# Patient Record
Sex: Female | Born: 1940
Health system: Southern US, Community
[De-identification: ages and names within clinical notes are randomized; demographics above are authoritative.]

## PROBLEM LIST (undated history)

## (undated) DIAGNOSIS — T8859XA Other complications of anesthesia, initial encounter: Secondary | ICD-10-CM

## (undated) DIAGNOSIS — I499 Cardiac arrhythmia, unspecified: Secondary | ICD-10-CM

## (undated) DIAGNOSIS — R112 Nausea with vomiting, unspecified: Secondary | ICD-10-CM

## (undated) DIAGNOSIS — I10 Essential (primary) hypertension: Secondary | ICD-10-CM

## (undated) DIAGNOSIS — E039 Hypothyroidism, unspecified: Secondary | ICD-10-CM

## (undated) DIAGNOSIS — K432 Incisional hernia without obstruction or gangrene: Secondary | ICD-10-CM

## (undated) DIAGNOSIS — T4145XA Adverse effect of unspecified anesthetic, initial encounter: Secondary | ICD-10-CM

## (undated) DIAGNOSIS — M899 Disorder of bone, unspecified: Secondary | ICD-10-CM

## (undated) DIAGNOSIS — E785 Hyperlipidemia, unspecified: Secondary | ICD-10-CM

## (undated) DIAGNOSIS — M545 Low back pain: Secondary | ICD-10-CM

## (undated) DIAGNOSIS — C3490 Malignant neoplasm of unspecified part of unspecified bronchus or lung: Secondary | ICD-10-CM

## (undated) DIAGNOSIS — M949 Disorder of cartilage, unspecified: Secondary | ICD-10-CM

## (undated) DIAGNOSIS — Z905 Acquired absence of kidney: Secondary | ICD-10-CM

## (undated) DIAGNOSIS — Z86010 Personal history of colonic polyps: Secondary | ICD-10-CM

## (undated) DIAGNOSIS — J309 Allergic rhinitis, unspecified: Secondary | ICD-10-CM

## (undated) DIAGNOSIS — Z9889 Other specified postprocedural states: Secondary | ICD-10-CM

## (undated) DIAGNOSIS — I480 Paroxysmal atrial fibrillation: Secondary | ICD-10-CM

## (undated) DIAGNOSIS — N183 Chronic kidney disease, stage 3 (moderate): Secondary | ICD-10-CM

## (undated) DIAGNOSIS — K219 Gastro-esophageal reflux disease without esophagitis: Secondary | ICD-10-CM

## (undated) DIAGNOSIS — K5730 Diverticulosis of large intestine without perforation or abscess without bleeding: Secondary | ICD-10-CM

## (undated) DIAGNOSIS — I498 Other specified cardiac arrhythmias: Secondary | ICD-10-CM

## (undated) DIAGNOSIS — K648 Other hemorrhoids: Secondary | ICD-10-CM

## (undated) HISTORY — DX: Disorder of cartilage, unspecified: M94.9

## (undated) HISTORY — PX: TONSILLECTOMY: SUR1361

## (undated) HISTORY — DX: Gastro-esophageal reflux disease without esophagitis: K21.9

## (undated) HISTORY — DX: Diverticulosis of large intestine without perforation or abscess without bleeding: K57.30

## (undated) HISTORY — DX: Low back pain: M54.5

## (undated) HISTORY — DX: Other specified cardiac arrhythmias: I49.8

## (undated) HISTORY — DX: Malignant neoplasm of unspecified part of unspecified bronchus or lung: C34.90

## (undated) HISTORY — DX: Allergic rhinitis, unspecified: J30.9

## (undated) HISTORY — PX: NEPHRECTOMY: SHX65

## (undated) HISTORY — DX: Personal history of colonic polyps: Z86.010

## (undated) HISTORY — DX: Hypothyroidism, unspecified: E03.9

## (undated) HISTORY — DX: Incisional hernia without obstruction or gangrene: K43.2

## (undated) HISTORY — DX: Other hemorrhoids: K64.8

## (undated) HISTORY — PX: OTHER SURGICAL HISTORY: SHX169

## (undated) HISTORY — DX: Acquired absence of kidney: Z90.5

## (undated) HISTORY — DX: Hyperlipidemia, unspecified: E78.5

## (undated) HISTORY — DX: Paroxysmal atrial fibrillation: I48.0

## (undated) HISTORY — DX: Essential (primary) hypertension: I10

## (undated) HISTORY — DX: Disorder of bone, unspecified: M89.9

## (undated) HISTORY — DX: Chronic kidney disease, stage 3 (moderate): N18.3

---
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
## 1997-11-09 HISTORY — PX: LAMINECTOMY: SHX219

## 1998-03-21 ENCOUNTER — Inpatient Hospital Stay (HOSPITAL_COMMUNITY): Admission: RE | Admit: 1998-03-21 | Discharge: 1998-03-22 | Payer: Self-pay | Admitting: Neurosurgery

## 1998-03-28 ENCOUNTER — Inpatient Hospital Stay (HOSPITAL_COMMUNITY): Admission: EM | Admit: 1998-03-28 | Discharge: 1998-03-31 | Payer: Self-pay | Admitting: Emergency Medicine

## 1998-04-03 ENCOUNTER — Inpatient Hospital Stay (HOSPITAL_COMMUNITY): Admission: AD | Admit: 1998-04-03 | Discharge: 1998-04-08 | Payer: Self-pay | Admitting: Neurosurgery

## 2001-12-21 ENCOUNTER — Other Ambulatory Visit: Admission: RE | Admit: 2001-12-21 | Discharge: 2001-12-21 | Payer: Self-pay | Admitting: Obstetrics & Gynecology

## 2003-01-02 ENCOUNTER — Other Ambulatory Visit: Admission: RE | Admit: 2003-01-02 | Discharge: 2003-01-02 | Payer: Self-pay | Admitting: Obstetrics & Gynecology

## 2004-01-21 ENCOUNTER — Other Ambulatory Visit: Admission: RE | Admit: 2004-01-21 | Discharge: 2004-01-21 | Payer: Self-pay | Admitting: Obstetrics & Gynecology

## 2004-07-15 ENCOUNTER — Other Ambulatory Visit: Admission: RE | Admit: 2004-07-15 | Discharge: 2004-07-15 | Payer: Self-pay | Admitting: Obstetrics & Gynecology

## 2004-09-26 ENCOUNTER — Ambulatory Visit: Payer: Self-pay | Admitting: Internal Medicine

## 2005-02-11 ENCOUNTER — Other Ambulatory Visit: Admission: RE | Admit: 2005-02-11 | Discharge: 2005-02-11 | Payer: Self-pay | Admitting: Obstetrics & Gynecology

## 2005-08-17 ENCOUNTER — Ambulatory Visit: Payer: Self-pay | Admitting: Internal Medicine

## 2005-08-17 ENCOUNTER — Other Ambulatory Visit: Admission: RE | Admit: 2005-08-17 | Discharge: 2005-08-17 | Payer: Self-pay | Admitting: Obstetrics & Gynecology

## 2005-09-23 ENCOUNTER — Ambulatory Visit: Payer: Self-pay | Admitting: Internal Medicine

## 2006-02-23 ENCOUNTER — Other Ambulatory Visit: Admission: RE | Admit: 2006-02-23 | Discharge: 2006-02-23 | Payer: Self-pay | Admitting: Obstetrics & Gynecology

## 2006-04-15 ENCOUNTER — Ambulatory Visit (HOSPITAL_COMMUNITY): Admission: RE | Admit: 2006-04-15 | Discharge: 2006-04-15 | Payer: Self-pay | Admitting: Obstetrics & Gynecology

## 2006-04-15 ENCOUNTER — Encounter (INDEPENDENT_AMBULATORY_CARE_PROVIDER_SITE_OTHER): Payer: Self-pay | Admitting: *Deleted

## 2006-07-28 ENCOUNTER — Ambulatory Visit: Payer: Self-pay | Admitting: Internal Medicine

## 2006-09-10 ENCOUNTER — Ambulatory Visit: Payer: Self-pay | Admitting: Internal Medicine

## 2007-02-08 ENCOUNTER — Encounter: Payer: Self-pay | Admitting: Internal Medicine

## 2007-02-08 LAB — CONVERTED CEMR LAB: Pap Smear: NORMAL

## 2007-02-17 ENCOUNTER — Encounter: Admission: RE | Admit: 2007-02-17 | Discharge: 2007-02-17 | Payer: Self-pay | Admitting: Obstetrics & Gynecology

## 2007-06-21 ENCOUNTER — Ambulatory Visit: Payer: Self-pay | Admitting: Internal Medicine

## 2007-07-05 ENCOUNTER — Encounter: Payer: Self-pay | Admitting: Internal Medicine

## 2007-07-05 ENCOUNTER — Ambulatory Visit: Payer: Self-pay | Admitting: Internal Medicine

## 2007-08-04 ENCOUNTER — Encounter: Payer: Self-pay | Admitting: *Deleted

## 2007-08-04 DIAGNOSIS — M899 Disorder of bone, unspecified: Secondary | ICD-10-CM

## 2007-08-04 DIAGNOSIS — K219 Gastro-esophageal reflux disease without esophagitis: Secondary | ICD-10-CM

## 2007-08-04 DIAGNOSIS — K648 Other hemorrhoids: Secondary | ICD-10-CM

## 2007-08-04 DIAGNOSIS — M949 Disorder of cartilage, unspecified: Secondary | ICD-10-CM

## 2007-08-04 DIAGNOSIS — E669 Obesity, unspecified: Secondary | ICD-10-CM | POA: Insufficient documentation

## 2007-08-04 HISTORY — DX: Gastro-esophageal reflux disease without esophagitis: K21.9

## 2007-08-04 HISTORY — DX: Other hemorrhoids: K64.8

## 2007-08-04 HISTORY — DX: Disorder of bone, unspecified: M89.9

## 2007-08-06 DIAGNOSIS — I10 Essential (primary) hypertension: Secondary | ICD-10-CM | POA: Insufficient documentation

## 2007-08-06 DIAGNOSIS — M545 Low back pain, unspecified: Secondary | ICD-10-CM | POA: Insufficient documentation

## 2007-08-06 HISTORY — DX: Essential (primary) hypertension: I10

## 2007-08-06 HISTORY — DX: Low back pain, unspecified: M54.50

## 2007-08-25 ENCOUNTER — Encounter: Payer: Self-pay | Admitting: Internal Medicine

## 2007-08-25 ENCOUNTER — Ambulatory Visit: Payer: Self-pay | Admitting: Internal Medicine

## 2007-08-25 DIAGNOSIS — K5730 Diverticulosis of large intestine without perforation or abscess without bleeding: Secondary | ICD-10-CM

## 2007-08-25 DIAGNOSIS — Z860100 Personal history of colon polyps, unspecified: Secondary | ICD-10-CM

## 2007-08-25 HISTORY — DX: Personal history of colon polyps, unspecified: Z86.0100

## 2007-08-25 HISTORY — DX: Personal history of colonic polyps: Z86.010

## 2007-08-25 HISTORY — DX: Diverticulosis of large intestine without perforation or abscess without bleeding: K57.30

## 2007-08-25 LAB — CONVERTED CEMR LAB
Albumin: 3.9 g/dL (ref 3.5–5.2)
Basophils Absolute: 0 10*3/uL (ref 0.0–0.1)
Bilirubin Urine: NEGATIVE
Cholesterol: 233 mg/dL (ref 0–200)
Creatinine, Ser: 1.1 mg/dL (ref 0.4–1.2)
Direct LDL: 126.3 mg/dL
Eosinophils Absolute: 0.1 10*3/uL (ref 0.0–0.6)
GFR calc Af Amer: 64 mL/min
GFR calc non Af Amer: 53 mL/min
HCT: 38.9 % (ref 36.0–46.0)
HDL: 70.4 mg/dL (ref >39.0)
Hemoglobin, Urine: NEGATIVE
Hemoglobin: 13.5 g/dL (ref 12.0–15.0)
Leukocytes, UA: NEGATIVE
MCHC: 34.6 g/dL (ref 30.0–36.0)
MCV: 96.9 fL (ref 78.0–100.0)
Monocytes Absolute: 0.6 10*3/uL (ref 0.2–0.7)
Neutro Abs: 4 10*3/uL (ref 1.4–7.7)
Neutrophils Relative %: 60.3 % (ref 43.0–77.0)
Potassium: 5.2 meq/L — ABNORMAL HIGH (ref 3.5–5.1)
Sodium: 144 meq/L (ref 135–145)
TSH: 4.01 microintl units/mL (ref 0.35–5.50)
Total Bilirubin: 0.9 mg/dL (ref 0.3–1.2)
Total CHOL/HDL Ratio: 3.3
Urobilinogen, UA: 0.2 (ref 0.0–1.0)

## 2007-11-18 ENCOUNTER — Encounter: Payer: Self-pay | Admitting: Internal Medicine

## 2008-08-15 ENCOUNTER — Telehealth: Payer: Self-pay | Admitting: Internal Medicine

## 2008-08-30 ENCOUNTER — Ambulatory Visit: Payer: Self-pay | Admitting: Internal Medicine

## 2008-08-30 DIAGNOSIS — R21 Rash and other nonspecific skin eruption: Secondary | ICD-10-CM | POA: Insufficient documentation

## 2009-02-07 LAB — HM MAMMOGRAPHY: HM Mammogram: NORMAL

## 2009-02-07 LAB — CONVERTED CEMR LAB: Pap Smear: NORMAL

## 2009-02-25 ENCOUNTER — Encounter: Payer: Self-pay | Admitting: Internal Medicine

## 2009-03-14 ENCOUNTER — Telehealth (INDEPENDENT_AMBULATORY_CARE_PROVIDER_SITE_OTHER): Payer: Self-pay | Admitting: *Deleted

## 2009-04-09 ENCOUNTER — Ambulatory Visit: Payer: Self-pay | Admitting: Internal Medicine

## 2009-04-09 DIAGNOSIS — M549 Dorsalgia, unspecified: Secondary | ICD-10-CM | POA: Insufficient documentation

## 2009-04-12 ENCOUNTER — Telehealth: Payer: Self-pay | Admitting: Internal Medicine

## 2009-04-16 ENCOUNTER — Encounter: Admission: RE | Admit: 2009-04-16 | Discharge: 2009-04-16 | Payer: Self-pay | Admitting: Internal Medicine

## 2009-04-19 ENCOUNTER — Telehealth: Payer: Self-pay | Admitting: Internal Medicine

## 2009-05-01 ENCOUNTER — Encounter: Payer: Self-pay | Admitting: Internal Medicine

## 2009-05-15 ENCOUNTER — Encounter: Payer: Self-pay | Admitting: Internal Medicine

## 2009-06-12 ENCOUNTER — Encounter: Payer: Self-pay | Admitting: Internal Medicine

## 2009-08-06 ENCOUNTER — Ambulatory Visit: Payer: Self-pay | Admitting: Internal Medicine

## 2009-08-06 LAB — CONVERTED CEMR LAB
ALT: 16 units/L (ref 0–35)
Albumin: 3.6 g/dL (ref 3.5–5.2)
Alkaline Phosphatase: 55 units/L (ref 39–117)
Basophils Relative: 0.6 % (ref 0.0–3.0)
Bilirubin, Direct: 0.1 mg/dL (ref 0.0–0.3)
CO2: 31 meq/L (ref 19–32)
Chloride: 108 meq/L (ref 96–112)
Cholesterol: 203 mg/dL — ABNORMAL HIGH (ref 0–200)
Direct LDL: 114.9 mg/dL
Eosinophils Relative: 0.8 % (ref 0.0–5.0)
Glucose, Bld: 92 mg/dL (ref 70–99)
Monocytes Absolute: 0.6 10*3/uL (ref 0.1–1.0)
Neutro Abs: 5.7 10*3/uL (ref 1.4–7.7)
Neutrophils Relative %: 70.6 % (ref 43.0–77.0)
RDW: 12.6 % (ref 11.5–14.6)
Sodium: 144 meq/L (ref 135–145)
Specific Gravity, Urine: 1.02 (ref 1.000–1.030)
Total CHOL/HDL Ratio: 3
Total Protein, Urine: NEGATIVE mg/dL
Total Protein: 6.7 g/dL (ref 6.0–8.3)
Urine Glucose: NEGATIVE mg/dL

## 2009-08-13 ENCOUNTER — Ambulatory Visit: Payer: Self-pay | Admitting: Internal Medicine

## 2009-08-13 DIAGNOSIS — R634 Abnormal weight loss: Secondary | ICD-10-CM | POA: Insufficient documentation

## 2009-08-13 DIAGNOSIS — K432 Incisional hernia without obstruction or gangrene: Secondary | ICD-10-CM | POA: Insufficient documentation

## 2009-08-13 HISTORY — DX: Incisional hernia without obstruction or gangrene: K43.2

## 2009-09-04 ENCOUNTER — Encounter: Payer: Self-pay | Admitting: Internal Medicine

## 2009-10-24 ENCOUNTER — Inpatient Hospital Stay (HOSPITAL_COMMUNITY): Admission: AD | Admit: 2009-10-24 | Discharge: 2009-10-28 | Payer: Self-pay | Admitting: General Surgery

## 2009-11-21 ENCOUNTER — Encounter: Payer: Self-pay | Admitting: Internal Medicine

## 2009-11-26 ENCOUNTER — Telehealth: Payer: Self-pay | Admitting: Internal Medicine

## 2009-12-16 ENCOUNTER — Encounter: Payer: Self-pay | Admitting: Internal Medicine

## 2010-02-26 ENCOUNTER — Encounter: Payer: Self-pay | Admitting: Internal Medicine

## 2010-05-27 ENCOUNTER — Encounter: Payer: Self-pay | Admitting: Internal Medicine

## 2010-06-05 ENCOUNTER — Encounter: Payer: Self-pay | Admitting: Internal Medicine

## 2010-06-05 ENCOUNTER — Ambulatory Visit: Payer: Self-pay | Admitting: Internal Medicine

## 2010-06-05 DIAGNOSIS — M79609 Pain in unspecified limb: Secondary | ICD-10-CM | POA: Insufficient documentation

## 2010-09-19 ENCOUNTER — Ambulatory Visit: Payer: Self-pay | Admitting: Internal Medicine

## 2010-09-19 LAB — CONVERTED CEMR LAB
AST: 18 units/L (ref 0–37)
BUN: 24 mg/dL — ABNORMAL HIGH (ref 6–23)
Basophils Absolute: 0 10*3/uL (ref 0.0–0.1)
Calcium: 9.9 mg/dL (ref 8.4–10.5)
Cholesterol: 216 mg/dL — ABNORMAL HIGH (ref 0–200)
Creatinine, Ser: 1.4 mg/dL — ABNORMAL HIGH (ref 0.4–1.2)
Direct LDL: 109.4 mg/dL
GFR calc non Af Amer: 39.8 mL/min (ref >60)
Glucose, Bld: 90 mg/dL (ref 70–99)
HCT: 38.2 % (ref 36.0–46.0)
HDL: 77.1 mg/dL (ref >39.00)
Ketones, ur: NEGATIVE mg/dL
Leukocytes, UA: NEGATIVE
Lymphocytes Relative: 26.6 % (ref 12.0–46.0)
Lymphs Abs: 1.5 10*3/uL (ref 0.7–4.0)
Monocytes Relative: 7.6 % (ref 3.0–12.0)
Nitrite: NEGATIVE
Platelets: 206 10*3/uL (ref 150.0–400.0)
RDW: 13.6 % (ref 11.5–14.6)
Specific Gravity, Urine: 1.02 (ref 1.000–1.030)
TSH: 4.17 microintl units/mL (ref 0.35–5.50)
Total Bilirubin: 0.7 mg/dL (ref 0.3–1.2)
Triglycerides: 128 mg/dL (ref 0.0–149.0)
pH: 5.5 (ref 5.0–8.0)

## 2010-09-26 ENCOUNTER — Ambulatory Visit: Payer: Self-pay | Admitting: Internal Medicine

## 2010-09-26 DIAGNOSIS — I498 Other specified cardiac arrhythmias: Secondary | ICD-10-CM

## 2010-09-26 HISTORY — DX: Other specified cardiac arrhythmias: I49.8

## 2010-09-27 DIAGNOSIS — E785 Hyperlipidemia, unspecified: Secondary | ICD-10-CM | POA: Insufficient documentation

## 2010-09-27 HISTORY — DX: Hyperlipidemia, unspecified: E78.5

## 2010-10-24 ENCOUNTER — Ambulatory Visit: Payer: Self-pay | Admitting: Internal Medicine

## 2010-10-24 LAB — CONVERTED CEMR LAB
GFR calc non Af Amer: 47.15 mL/min — ABNORMAL LOW (ref >60.00)
Potassium: 5.2 meq/L — ABNORMAL HIGH (ref 3.5–5.1)
Sodium: 140 meq/L (ref 135–145)

## 2010-11-30 ENCOUNTER — Encounter: Payer: Self-pay | Admitting: Obstetrics & Gynecology

## 2010-12-07 LAB — CONVERTED CEMR LAB
Albumin: 3.7 g/dL (ref 3.5–5.2)
BUN: 20 mg/dL (ref 6–23)
Bilirubin Urine: NEGATIVE
Calcium: 9.4 mg/dL (ref 8.4–10.5)
Creatinine, Ser: 1.3 mg/dL — ABNORMAL HIGH (ref 0.4–1.2)
Eosinophils Relative: 1.9 % (ref 0.0–5.0)
GFR calc Af Amer: 52 mL/min
Glucose, Bld: 93 mg/dL (ref 70–99)
HCT: 40.2 % (ref 36.0–46.0)
Hemoglobin: 13.8 g/dL (ref 12.0–15.0)
Monocytes Absolute: 0.6 10*3/uL (ref 0.1–1.0)
Monocytes Relative: 7.6 % (ref 3.0–12.0)
Neutro Abs: 5.5 10*3/uL (ref 1.4–7.7)
Nitrite: NEGATIVE
Pap Smear: NORMAL
RDW: 12.5 % (ref 11.5–14.6)
TSH: 2.85 microintl units/mL (ref 0.35–5.50)
Total Protein, Urine: NEGATIVE mg/dL
Total Protein: 6.6 g/dL (ref 6.0–8.3)
Triglycerides: 143 mg/dL (ref 0–149)
Vit D, 1,25-Dihydroxy: 40 (ref 30–89)
WBC: 8.4 10*3/uL (ref 4.5–10.5)
pH: 5.5 (ref 5.0–8.0)

## 2010-12-11 NOTE — Letter (Signed)
Summary: [facility]   Imported By: Sherian Rein 12/12/2009 11:23:18  _____________________________________________________________________  External Attachment:    Type:   Image     Comment:   External Document

## 2010-12-11 NOTE — Letter (Signed)
Summary: [facility]   Imported By: Lester Noonday 12/30/2009 10:18:36  _____________________________________________________________________  External Attachment:    Type:   Image     Comment:   External Document

## 2010-12-11 NOTE — Assessment & Plan Note (Signed)
Summary: 3 mos f/u #/cd   Vital Signs:  Patient profile:   70 year old female Height:      67 inches Weight:      146.50 pounds BMI:     23.03 O2 Sat:      99 % on Room air Temp:     96.8 degrees F oral Pulse rate:   44 / minute BP sitting:   120 / 72  (left arm) Cuff size:   regular  Vitals Entered By: Zella Ball Ewing CMA (AAMA) (September 26, 2010 1:29 PM)  O2 Flow:  Room air  CC: 3 month ROV/RE   CC:  3 month ROV/RE.  History of Present Illness: here for wellness, and f/u;  VS review shows low HR apparently asympt  and Pt denies CP, worsening sob, doe, wheezing, orthopnea, pnd, worsening LE edema, palps, dizziness or syncope  Also noted is solitary kidney status on ARB.  Pt denies new neuro symptoms such as headache, facial or extremity weakness  Pt denies polydipsia, polyuria  Overall good compliance with meds, trying to follow low chol  diet, wt stable, little excercise however .  Overall good compliance with meds, and good tolerability.  Denies worsening depressive symptoms, suicidal ideation, or panic.   No fever, wt loss, night sweats, loss of appetite or other constitutional symptoms  Denies hyper or hypo thyroid symtpoms such as wt , voice or skin change.  Due for flu shot today.  Needs med refills.    Preventive Screening-Counseling & Management      Drug Use:  no.    Problems Prior to Update: 1)  Arm Pain, Left  (ICD-729.5) 2)  Abdominal Incisional Hernia  (ICD-553.21) 3)  Weight Loss  (ICD-783.21) 4)  Back Pain  (ICD-724.5) 5)  Rash-nonvesicular  (ICD-782.1) 6)  Preventive Health Care  (ICD-V70.0) 7)  Preventive Health Care  (ICD-V70.0) 8)  Family History Diabetes 1st Degree Relative  (ICD-V18.0) 9)  Family History of Cad Female 1st Degree Relative <60  (ICD-V16.49) 10)  Diverticulosis, Colon  (ICD-562.10) 11)  Colonic Polyps, Hx of  (ICD-V12.72) 12)  Low Back Pain  (ICD-724.2) 13)  Hypertension  (ICD-401.9) 14)  Osteopenia  (ICD-733.90) 15)  Obesity   (ICD-278.00) 16)  Internal Hemorrhoids  (ICD-455.0) 17)  Gastroesophageal Reflux Disease  (ICD-530.81)  Medications Prior to Update: 1)  Atenolol 25 Mg Tabs (Atenolol) .[name].. 1 Tablet By Mouth Once A Day 2)  Cozaar 50 Mg Tabs (Losartan Potassium) .... Take 1 Tablet By Mouth Once A Day 3)  Levothyroxine Sodium 25 Mcg Tabs (Levothyroxine Sodium) .... Take 1 Tablet By Mouth Once A Day 4)  Lansoprazole 30 Mg Cpdr (Lansoprazole) .[name].. 1 By Mouth Once Daily 5)  Prempro 0.3-1.5 Mg  Tabs (Conj Estrog-Medroxyprogest Ace) .[name].. 1 By Mouth Once Daily 6)  Alprazolam 0.5 Mg  Tb24 (Alprazolam) .[name].. 1 By Mouth At Bedtime As Needed 7)  Ecotrin Low Strength 81 Mg  Tbec (Aspirin) .[name].. 1 By Mouth Qd 8)  Elidel 1 % Crea (Pimecrolimus) .... Use Asd Two Times A Day As Needed  Current Medications (verified): 1)  Levothyroxine Sodium 25 Mcg Tabs (Levothyroxine Sodium) .... Take 1 Tablet By Mouth Once A Day 2)  Lansoprazole 30 Mg Cpdr (Lansoprazole) .[name].. 1 By Mouth Once Daily 3)  Prempro 0.3-1.5 Mg  Tabs (Conj Estrog-Medroxyprogest Ace) .[name].. 1 By Mouth Once Daily 4)  Alprazolam 0.5 Mg  Tabs (Alprazolam) .[name].. 1po At Bedtime As Needed 5)  Ecotrin Low Strength 81 Mg  Tbec (  Aspirin) .[name].. 1 By Mouth Qd 6)  Elidel 1 % Crea (Pimecrolimus) .... Use Asd Two Times A Day As Needed 7)  Amlodipine Besylate 5 Mg Tabs (Amlodipine Besylate) .[name].. 1po Once Daily  Allergies (verified): 1)  ! 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         all otherwise negative per pt -    Physical Exam  General:  alert and well-developed.   Head:  normocephalic and atraumatic.   Eyes:  vision grossly intact, pupils equal, and pupils round.   Ears:  R ear normal and L ear normal.   Nose:  no external deformity and no nasal discharge.   Mouth:  no gingival abnormalities and pharynx pink and moist.   Neck:  supple and no masses.   Lungs:  normal respiratory effort and normal breath sounds.   Heart:  normal rate and regular rhythm.   Abdomen:  soft, non-tender, and normal bowel sounds.   Msk:  no joint tenderness and no joint swelling.   Extremities:  no edema, no erythema  Neurologic:  cranial nerves II-XII intact, strength normal in all extremities, gait normal, and DTRs symmetrical and normal.   Skin:  color normal and no rashes.   Psych:  not depressed appearing and slightly anxious.     Impression & Recommendations:  Problem # 1:  Preventive Health Care  (ICD-V70.0) Overall doing well, age appropriate education and counseling updated, referral for preventive services and immunizations addressed, dietary counseling and smoking status adressed , most recent labs reviewed I have personally reviewed and have noted 1.The patient's medical and social history 2.Their use of alcohol, tobacco or illicit drugs 3.Their current medications and supplements 4. Functional ability including ADL's, fall risk, home safety risk, hearing & visual impairment  5.Diet and physical activities 6.Evidence for depression or mood disorders The patients weight, height, BMI  have been recorded in the chart I have made referrals, counseling and provided education to the patient based review of the above   Problem # 2:  HYPERTENSION (ICD-401.9)  The following medications were removed from the medication list:    Atenolol 25 Mg Tabs (Atenolol) .[name]... 1 tablet by mouth once a day    Cozaar 50 Mg Tabs (Losartan potassium) .[name]... Take 1 tablet by mouth once a day Her updated medication  list for this problem includes:    Amlodipine Besylate 5 Mg Tabs (Amlodipine besylate) .[name]... 1po once daily  BP today: 120/72 Prior BP: 102/80 (06/05/2010)  Labs Reviewed: K+: 5.2 (09/19/2010) Creat: : 1.4 (09/19/2010)   Chol: 216 (09/19/2010)   HDL: 77.10 (09/19/2010)   LDL: DEL (08/30/2008)   TG: 128.0 (09/19/2010) stable overall by hx and exam, ok to continue meds/tx as is except change cozaar to amlodipine with mild cr elev at 1.4  Problem # 3:  BRADYCARDIA (ICD-427.89)  The following medications were removed from the medication list:    Atenolol 25 Mg Tabs (Atenolol) .[name]... 1 tablet by mouth once a day Her updated medication list for this problem includes:    Ecotrin Low Strength 81 Mg Tbec (Aspirin) .[name]... 1 by mouth qd to stop the beta blocker  Problem # 4:  HYPERLIPIDEMIA (ICD-272.4)  Labs Reviewed: SGOT: 18 (09/19/2010)   SGPT: 18 (09/19/2010)   HDL:77.10 (09/19/2010), 64.00  (08/06/2009)  LDL:DEL (08/30/2008), DEL (08/25/2007)  Chol:216 (09/19/2010), 203 (08/06/2009)  Trig:128.0 (09/19/2010), 119.0 (08/06/2009) Pt to continue diet efforts,  goal LDL less than 100  Complete Medication List: 1)  Levothyroxine Sodium 25 Mcg Tabs (Levothyroxine sodium) .... Take 1 tablet by mouth once a day 2)  Lansoprazole 30 Mg Cpdr (Lansoprazole) .[name].. 1 by mouth once daily 3)  Prempro 0.3-1.5 Mg Tabs (Conj estrog-medroxyprogest ace) .[name].. 1 by mouth once daily 4)  Alprazolam 0.5 Mg Tabs (Alprazolam) .[name].. 1po at bedtime as needed 5)  Ecotrin Low Strength 81 Mg Tbec (Aspirin) .[name].. 1 by mouth qd 6)  Elidel 1 % Crea (Pimecrolimus) .... Use asd two times a day as needed 7)  Amlodipine Besylate 5 Mg Tabs (Amlodipine besylate) .[name].. 1po once daily  Other Orders: Flu Vaccine 67yrs + (65784) Admin 1st Vaccine (69629)  Patient Instructions: 1)  you had the flu shot today 2)  stop the atenolol 3)  stop the cozaar 4)  start the amlodpine 5 mg - 1 per day 5)  Please return for LAB only in 4 wks:  BMET :  285.9 6)  Continue all other previous medications as before this visit  7)  Check your Blood Pressure regularly. If it is above 140/90: you should make an appointment. 8)  Please schedule a follow-up appointment in 6 months, or sooner if needed Prescriptions: LANSOPRAZOLE 30 MG CPDR (LANSOPRAZOLE) 1 by mouth once daily  #90 x 3   Entered and Authorized by:   Corwin Levins MD   Signed by:   Corwin Levins MD on 09/26/2010   Method used:   Print then Give to Patient   RxID:   [id number] LEVOTHYROXINE SODIUM 25 MCG TABS (LEVOTHYROXINE SODIUM) Take 1 tablet by mouth once a day  #90 x 3   Entered and Authorized by:   Corwin Levins MD   Signed by:   Corwin Levins MD on 09/26/2010   Method used:   Print then Give to Patient   RxID:   [id number] AMLODIPINE BESYLATE 5 MG TABS (AMLODIPINE BESYLATE) 1po once daily  #90 x 3   Entered and Authorized by:   Corwin Levins MD   Signed by:    Corwin Levins MD on 09/26/2010   Method used:   Print then Give to Patient   RxID:   [id number] ALPRAZOLAM 0.5 MG  TABS (ALPRAZOLAM) 1po at bedtime as needed  #30 x 5   Entered and Authorized by:   Corwin Levins MD  Signed by:   Corwin Levins MD on 09/26/2010   Method used:   Print then Give to Patient   RxID:   [id number]    Orders Added: 1)  Flu Vaccine 70yrs + [56213] 2)  Admin 1st Vaccine [90471] 3)  Est. Patient 65& > [08657]   Immunizations Administered:  Influenza Vaccine # 1:    Vaccine Type: Fluvax 3+    Site: left deltoid    Mfr: Sanofi Pasteur    Dose: 0.5 ml    Route: IM    Given by: Zella Ball Ewing CMA (AAMA)    Exp. Date: 05/09/2011    Lot #: [id number]    VIS given: 06/02/07 version given September 26, 2010.  Flu Vaccine Consent Questions:    Do you have a history of severe allergic reactions to this vaccine? no    Any prior history of allergic reactions to egg and/or gelatin? no    Do you have a sensitivity to the preservative Thimersol? no    Do you have a past history of Guillan-Barre Syndrome? no    Do you currently have an acute febrile illness? no    Have you ever had a severe reaction to latex? no    Vaccine information given and explained to patient? no    Are you currently pregnant? no   Immunizations Administered:  Influenza Vaccine # 1:    Vaccine Type: Fluvax 3+    Site: left deltoid    Mfr: Sanofi Pasteur    Dose: 0.5 ml    Route: IM    Given by: Zella Ball Ewing CMA (AAMA)    Exp. Date: 05/09/2011    Lot #: [id number]    VIS given: 06/02/07 version given September 26, 2010.

## 2010-12-11 NOTE — Progress Notes (Signed)
----   Converted from flag ---- ---- 11/26/2009 10:09 AM, Corwin Levins MD wrote: noted; appears pt has been filling early somewhat on a monthly basis  ---- 11/26/2009 9:43 AM, Zella Ball Ewing wrote: [facility] in [place] called questioning pts xanax rx. The Pharmacist informed she has been getting it filled with them the .5mg  three times a day #90, filled on 06/01/2009, 07/03/2009, 08/02/2009, 08/29/2009, 09/27/2009 and 10/23/2009.  Thought you should be aware. ------------------------------

## 2010-12-11 NOTE — Assessment & Plan Note (Signed)
Summary: left arm pain/#/cd--okd dahlia   Vital Signs:  Patient profile:   70 year old female Height:      67 inches Weight:      146.50 pounds BMI:     23.03 O2 Sat:      98 % on Room air Temp:     98.3 degrees F oral Pulse rate:   49 / minute BP sitting:   102 / 80  (left arm) Cuff size:   regular  Vitals Entered By: Zella Ball Ewing CMA (AAMA) (June 05, 2010 1:54 PM)  O2 Flow:  Room air CC: Left arm pain/RE   CC:  Left arm pain/RE.  History of Present Illness: here with1 -2 days onset mild to mod sharp pain to left upper pain , last few several seconds and resolves;  no shoulder pain or elbow pain or other symptom;  Pt denies CP, sob, doe, wheezing, orthopnea, pnd, worsening LE edema, palps, dizziness or syncope No fever, wt loss, night sweats, loss of appetite or other constitutional symptoms  Pt denies new neuro symptoms such as headache, facial or extremity weakness  Per pt almost "feels like spasm.'   No reflux worsening , dysphagia, n/v, wt loss, abd pain.    Problems Prior to Update: 1)  Arm Pain, Left  (ICD-729.5) 2)  Abdominal Incisional Hernia  (ICD-553.21) 3)  Weight Loss  (ICD-783.21) 4)  Back Pain  (ICD-724.5) 5)  Rash-nonvesicular  (ICD-782.1) 6)  Preventive Health Care  (ICD-V70.0) 7)  Preventive Health Care  (ICD-V70.0) 8)  Family History Diabetes 1st Degree Relative  (ICD-V18.0) 9)  Family History of Cad Female 1st Degree Relative <60  (ICD-V16.49) 10)  Diverticulosis, Colon  (ICD-562.10) 11)  Colonic Polyps, Hx of  (ICD-V12.72) 12)  Low Back Pain  (ICD-724.2) 13)  Hypertension  (ICD-401.9) 14)  Osteopenia  (ICD-733.90) 15)  Obesity  (ICD-278.00) 16)  Internal Hemorrhoids  (ICD-455.0) 17)  Gastroesophageal Reflux Disease  (ICD-530.81)  Medications Prior to Update: 1)  Atenolol 25 Mg Tabs (Atenolol) .[name].. 1 Tablet By Mouth Once A Day 2)  Cozaar 50 Mg Tabs (Losartan Potassium) .... Take 1 Tablet By Mouth Once A Day 3)  Levothyroxine Sodium 25 Mcg Tabs  (Levothyroxine Sodium) .... Take 1 Tablet By Mouth Once A Day 4)  Lansoprazole 30 Mg Cpdr (Lansoprazole) .[name].. 1 By Mouth Once Daily 5)  Prempro 0.3-1.5 Mg  Tabs (Conj Estrog-Medroxyprogest Ace) .[name].. 1 By Mouth Once Daily 6)  Alprazolam 0.5 Mg  Tb24 (Alprazolam) .[name].. 1 By Mouth At Bedtime As Needed 7)  Ecotrin Low Strength 81 Mg  Tbec (Aspirin) .[name].. 1 By Mouth Qd 8)  Elidel 1 % Crea (Pimecrolimus) .... Use Asd Two Times A Day As Needed  Current Medications (verified): 1)  Atenolol 25 Mg Tabs (Atenolol) .[name].. 1 Tablet By Mouth Once A Day 2)  Cozaar 50 Mg Tabs (Losartan Potassium) .... Take 1 Tablet By Mouth Once A Day 3)  Levothyroxine Sodium 25 Mcg Tabs (Levothyroxine Sodium) .... Take 1 Tablet By Mouth Once A Day 4)  Lansoprazole 30 Mg Cpdr (Lansoprazole) .[name].. 1 By Mouth Once Daily 5)  Prempro 0.3-1.5 Mg  Tabs (Conj Estrog-Medroxyprogest Ace) .[name].. 1 By Mouth Once Daily 6)  Alprazolam 0.5 Mg  Tb24 (Alprazolam) .[name].. 1 By Mouth At Bedtime As Needed 7)  Ecotrin Low Strength 81 Mg  Tbec (Aspirin) .[name].. 1 By Mouth Qd 8)  Elidel 1 % Crea (Pimecrolimus) .... Use Asd Two Times A Day As Needed  Allergies (verified): 1)  !  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   Head:  normocephalic and  atraumatic.   Eyes:  vision grossly intact, pupils equal, and pupils round.   Ears:  R ear normal and L ear normal.   Nose:  no external deformity and no nasal discharge.   Mouth:  no gingival abnormalities and pharynx pink and moist.   Neck:  supple and no masses.   Lungs:  normal respiratory effort and normal breath sounds.   Heart:  normal rate and regular rhythm.   Msk:  left tricep mild tender, spine nontender, neck FROM and NT, shoudler FROM and NT, elbow FROM and NT.  Some mild tender at the deltoid/humerus insertion site as well Extremities:  no edema, no erythema  Neurologic:  strength normal in all extremities, sensation intact to light touch, and DTRs symmetrical and normal.     Impression & Recommendations:  Problem # 1:  ARM PAIN, LEFT (ICD-729.5) c/w MSK soft tissue strain or deltod tendonitis most likely, exam o/w bening, pt educated and reassured. , tx with tylenol as needed  Orders: EKG w/ Interpretation (93000)  Problem # 2:  HYPERTENSION (ICD-401.9)  Her updated medication list for this problem includes:    Atenolol 25 Mg Tabs (Atenolol) .[name]... 1 tablet by mouth once a day    Cozaar 50 Mg Tabs (Losartan potassium) .[name]... Take 1 tablet by mouth once a day  BP today: 102/80 Prior BP: 118/78 (08/13/2009)  Labs Reviewed: K+: 4.3 (08/06/2009) Creat: : 1.3 (08/06/2009)   Chol: 203 (08/06/2009)   HDL: 64.00 (08/06/2009)   LDL: DEL (08/30/2008)   TG: 119.0 (08/06/2009) stable overall by hx and exam, ok to continue meds/tx as is   Problem # 3:  GASTROESOPHAGEAL REFLUX DISEASE (ICD-530.81)  Her updated medication list for this problem includes:    Lansoprazole 30 Mg Cpdr (Lansoprazole) .[name]... 1 by mouth once daily stable overall by hx and exam, ok to continue meds/tx as is   Complete Medication List: 1)  Atenolol 25 Mg Tabs (Atenolol) .[name].. 1 tablet by mouth once a day 2)  Cozaar 50 Mg Tabs (Losartan potassium) .... Take 1 tablet by mouth once a day 3)  Levothyroxine  Sodium 25 Mcg Tabs (Levothyroxine sodium) .... Take 1 tablet by mouth once a day 4)  Lansoprazole 30 Mg Cpdr (Lansoprazole) .[name].. 1 by mouth once daily 5)  Prempro 0.3-1.5 Mg Tabs (Conj estrog-medroxyprogest ace) .[name].. 1 by mouth once daily 6)  Alprazolam 0.5 Mg Tb24 (Alprazolam) .[name].. 1 by mouth at bedtime as needed 7)  Ecotrin Low Strength 81 Mg Tbec (Aspirin) .[name].. 1 by mouth qd 8)  Elidel 1 % Crea (Pimecrolimus) .... Use asd two times a day as needed  Patient Instructions: 1)  Your EKG was OK today 2)  Continue all previous medications as before this visit  3)  You can take tylenol  as needed discomfort as needed  4)  Please schedule a follow-up appointment in 3 months with CPX labs

## 2010-12-11 NOTE — Letter (Signed)
Summary: Colonoscopy Letter  [facility]  [address] [place], [place] [address]   Phone: [phone]  Fax: [phone]      May 27, 2010 MRN: [id number]   Carolyn Wells [address] [place], [place]  [address]   Dear Ms. Ridings,   According to your medical record, it is time for you to schedule a Colonoscopy. The American Cancer Society recommends this procedure as a method to detect early colon cancer. Patients with a family history of colon cancer, or a personal history of colon polyps or inflammatory bowel disease are at increased risk.  This letter has been generated based on the recommendations made at the time of your procedure. If you feel that in your particular situation this may no longer apply, please contact our office.  Please call our office at [phone] to schedule this appointment or to update your records at your earliest convenience.  Thank you for cooperating with [place] to provide you with the very best care possible.   Sincerely,  Wilhemina Bonito. Marina Goodell, M.D.  [facility] [phone]

## 2011-02-09 LAB — BASIC METABOLIC PANEL
BUN: 7 mg/dL (ref 6–23)
CO2: 25 mEq/L (ref 19–32)
Chloride: 107 mEq/L (ref 96–112)
Chloride: 112 mEq/L (ref 96–112)
Creatinine, Ser: 1.06 mg/dL (ref 0.4–1.2)
GFR calc Af Amer: 50 mL/min — ABNORMAL LOW (ref >60)
Potassium: 4.6 mEq/L (ref 3.5–5.1)
Sodium: 139 mEq/L (ref 135–145)

## 2011-02-09 LAB — CBC
HCT: 33.8 % — ABNORMAL LOW (ref 36.0–46.0)
HCT: 35.7 % — ABNORMAL LOW (ref 36.0–46.0)
MCHC: 34.5 g/dL (ref 30.0–36.0)
MCV: 98.8 fL (ref 78.0–100.0)
MCV: 98.9 fL (ref 78.0–100.0)
Platelets: 142 10*3/uL — ABNORMAL LOW (ref 150–400)
RBC: 3.42 MIL/uL — ABNORMAL LOW (ref 3.87–5.11)
RBC: 3.61 MIL/uL — ABNORMAL LOW (ref 3.87–5.11)
WBC: 14 10*3/uL — ABNORMAL HIGH (ref 4.0–10.5)

## 2011-02-10 LAB — DIFFERENTIAL
Eosinophils Relative: 1 % (ref 0–5)
Lymphocytes Relative: 22 % (ref 12–46)
Lymphs Abs: 1.9 10*3/uL (ref 0.7–4.0)
Monocytes Absolute: 0.7 10*3/uL (ref 0.1–1.0)
Monocytes Relative: 8 % (ref 3–12)
Neutro Abs: 6.1 10*3/uL (ref 1.7–7.7)

## 2011-02-10 LAB — COMPREHENSIVE METABOLIC PANEL
AST: 22 U/L (ref 0–37)
Albumin: 3.9 g/dL (ref 3.5–5.2)
Chloride: 102 mEq/L (ref 96–112)
Creatinine, Ser: 1.42 mg/dL — ABNORMAL HIGH (ref 0.4–1.2)
GFR calc Af Amer: 44 mL/min — ABNORMAL LOW (ref >60)
Total Bilirubin: 0.5 mg/dL (ref 0.3–1.2)
Total Protein: 6.6 g/dL (ref 6.0–8.3)

## 2011-02-10 LAB — CBC
MCV: 98.2 fL (ref 78.0–100.0)
Platelets: 236 10*3/uL (ref 150–400)
RDW: 13.2 % (ref 11.5–15.5)
WBC: 8.7 10*3/uL (ref 4.0–10.5)

## 2011-03-10 ENCOUNTER — Encounter: Payer: Self-pay | Admitting: Internal Medicine

## 2011-03-12 ENCOUNTER — Other Ambulatory Visit: Payer: Self-pay | Admitting: Internal Medicine

## 2011-03-27 ENCOUNTER — Encounter: Payer: Self-pay | Admitting: Internal Medicine

## 2011-03-27 ENCOUNTER — Ambulatory Visit (INDEPENDENT_AMBULATORY_CARE_PROVIDER_SITE_OTHER): Payer: Managed Care, Other (non HMO) | Admitting: Internal Medicine

## 2011-03-27 VITALS — BP 150/82 | HR 57 | Temp 98.8°F | Ht 67.0 in | Wt 146.0 lb

## 2011-03-27 DIAGNOSIS — F419 Anxiety disorder, unspecified: Secondary | ICD-10-CM

## 2011-03-27 DIAGNOSIS — Z0000 Encounter for general adult medical examination without abnormal findings: Secondary | ICD-10-CM | POA: Insufficient documentation

## 2011-03-27 DIAGNOSIS — F418 Other specified anxiety disorders: Secondary | ICD-10-CM | POA: Insufficient documentation

## 2011-03-27 DIAGNOSIS — Z23 Encounter for immunization: Secondary | ICD-10-CM

## 2011-03-27 DIAGNOSIS — F411 Generalized anxiety disorder: Secondary | ICD-10-CM

## 2011-03-27 DIAGNOSIS — E785 Hyperlipidemia, unspecified: Secondary | ICD-10-CM

## 2011-03-27 DIAGNOSIS — I10 Essential (primary) hypertension: Secondary | ICD-10-CM

## 2011-03-27 MED ORDER — LANSOPRAZOLE 30 MG PO CPDR: 30.0000 mg | DELAYED_RELEASE_CAPSULE | Freq: Every day | ORAL | Status: DC

## 2011-03-27 MED ORDER — LEVOTHYROXINE SODIUM 25 MCG PO TABS: 25.0000 ug | ORAL_TABLET | Freq: Every day | ORAL | Status: DC

## 2011-03-27 MED ORDER — AMLODIPINE BESYLATE 5 MG PO TABS: 5.0000 mg | ORAL_TABLET | Freq: Every day | ORAL | Status: DC

## 2011-03-27 MED ORDER — PNEUMOCOCCAL VAC POLYVALENT 25 MCG/0.5ML IJ INJ
0.5000 mL | INJECTION | Freq: Once | INTRAMUSCULAR | Status: AC
  Administered 2011-03-27: 0.5 mL via INTRAMUSCULAR

## 2011-03-27 NOTE — Op Note (Signed)
NAMEGRAELYN, BIHL             ACCOUNT NO.:  [id number]   MEDICAL RECORD NO.:  [id number]          PATIENT TYPE:  AMB   LOCATION:  SDC                           FACILITY:  [facility]   PHYSICIAN:  Freddy Finner, M.D.   DATE OF BIRTH:  [birth year]   DATE OF PROCEDURE:  04/15/2006  DATE OF DISCHARGE:                                 OPERATIVE REPORT   PREOPERATIVE DIAGNOSES:  1.  Postmenopausal bleeding, on hormone replacement therapy.  2.  Endometrial thickening to 7.8 mm on pelvic ultrasound.  3.  Cervical stenosis, precluding sonohystogram.   POSTOPERATIVE DIAGNOSIS:  Filmy-appearing endometrial polyp.   OPERATIVE PROCEDURES:  1.  Hysteroscopy, complicated by uterine perforation.  2.  Laparoscopy for assessment and laparoscopic control of completion of      D&C.   SURGEON:  Freddy Finner, M.D.   ANESTHESIA:  General.   ESTIMATED INTRAOPERATIVE BLOOD LOSS:  20 cc.   SORBITOL DEFICIT:  350 cc.   OTHER INTRAOPERATIVE COMPLICATIONS:  None.   HEMOSTASIS AND PATIENT CONDITION:  Adequate and stable on completion of the  procedure.    The patient is a 70 year old who had an episode of postmenopausal bleeding  on light-dose hormone replacement therapy.  Cervical stenosis precluded  sonohystogram, but pelvic ultrasound was obtained with a finding of  thickening of the endometrium to 7.8 mm.  This warranted endometrial  sampling, and cervical stenosis precluded office procedure.  She is admitted  now for hysteroscopy and D&C.   She was admitted on the morning of surgery.  She was given 1 g of Ancef IV  preoperatively.  She was brought to the operating room, there placed under  adequate general anesthesia, placed in the dorsal lithotomy position.  Mons,  perineum, and vagina were prepped in the usual fashion.  Bivalve speculum  was introduced into the cervical os, grasping the anterior lip with a single-  tooth tenaculum.  The Mylex flexible os finder was then used and easily  identified the internal os.  Gentle placement of the sound immediately  sounded to approximately 16 cm.  The cervix was progressively dilated to 25  with Pratts.  The [facility] hysteroscope was introduced, and the cavity was  visualized, and there was this filmy lesion at the superior fundus appearing  to arise from the right.  The actual certainty of the tissue was suspicious  for fallopian tube or other intraabdominal material, and for that reason it  was elected to proceed with laparoscopy.  A Foley catheter was placed into  the bladder to preclude entry into the bladder, and the patient was given IV  methylene blue, and blue-stained urine was identified within the bladder but  nowhere else.  A Foley was also placed into the uterine cavity.  The abdomen  was then prepped, and the entire field redraped.  Two small incisions were  made in the abdomen, one at the umbilicus and one just above the symphysis.  Through the upper incision, an 11 mm nonbladed disposable trocar was  introduced while elevating the anterior abdominal wall, and direct  inspection revealed adequate placement within  the cavity, with no evidence  of injury on entry.  Pneumoperitoneum was allowed to accumulate.  A second 5  mm trocar was placed through the lower incision just above the symphysis.  Through it, a blunt probe was used for traction and exposure during the  procedure.  Scouting inspection to the upper abdomen revealed no  abnormalities except for adhesions overlying a right upper quadrant scar  done for previous kidney donation.  The appendix was not visualized, but no  other abnormalities were noted in the upper abdomen.  Pelvic structures  revealed a perforation in the top of the fundus, just to the right of  midline and adjacent to a subserosal myoma measuring approximately 1 cm.  The Foley bulb toward the tip of the Foley catheter was actually protruding  through the defect.  Photographs were made of this and  repeated after  removing the Foley.  Hemostasis from this site was adequate.  Laparoscopic  visualization was maintained, while attention was returned vaginally, and a  gentle thorough curettage and exploration with Randall stone forceps  produced what appeared to be an endometrial polyp and allowed [place] to perform  this without injury to intraabdominal structures.  A Hulka tenaculum was  then attached to the cervix.  Reinspection of abdominal contents was carried  out under irrigation using the Nezhat system.  Photographs were made.  With  adequate hemostasis, even under reduced intraabdominal pressure in a small  benign location of perforation, the procedure was terminated.  Gas was  allowed to escape from the abdomen.  Irrigating solution and sorbitol were  aspirated from the abdominal cavity.  Gas was allowed to escape.  The  instruments were removed.  The skin incisions were closed with interrupted  subcuticular sutures of 3-0 Dexon.  0.25% plain Marcaine was injected into  the incision sites for postoperative analgesia.  The incisions were closed  with subcuticular sutures of 3-0 Dexon.  Steri-Strips were applied to the  lower incision.  The patient was then awakened and taken to the recovery  room in good condition.  Current plan is to observe her for 3 or more hours  for stability and probable discharge to outpatient followup.  This will be  reassessed in approximately 3 or 4 hours postoperatively.      Freddy Finner, M.D.  Electronically Signed     WRN/MEDQ  D:  04/15/2006  T:  04/15/2006  Job:  [id number]

## 2011-03-27 NOTE — Assessment & Plan Note (Signed)
D/w pt , due to stressors might benefit from SSRI but she declines at this time

## 2011-03-27 NOTE — Assessment & Plan Note (Signed)
No  Charged today, but for pneumonia shot today

## 2011-03-27 NOTE — Assessment & Plan Note (Signed)
Mild elev today, I suspect may need incr amlodipine , but she declines, will check BP at home daily and call in 2 wks with results

## 2011-03-27 NOTE — Patient Instructions (Addendum)
Continue all other medications as before Please check your Blood Pressure at home every day for 2 wks and call with results Please call if you change your mind about the colonoscopy Please return in 6 mo with Lab testing done 3-5 days before

## 2011-03-27 NOTE — Assessment & Plan Note (Addendum)
D/w pt - to follow lower chol diet for now'  Last ldl approx 130, to consider statin if not improved next time

## 2011-03-27 NOTE — Progress Notes (Signed)
Subjective:    Patient ID: Carolyn Wells, female    DOB: 1941/03/11, 70 y.o.   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  Due for colonscopy on centricity review today (letter sent July 2011) but pt feels too overwhelmed now to do this, possibly will do in the future. Pt denies chest pain, increased sob or doe, wheezing, orthopnea, PND, increased LE swelling, palpitations, dizziness or syncope.  Pt denies new neurological symptoms such as new headache, or facial or extremity weakness or numbness   Pt denies polydipsia, polyuria  Pt states overall good compliance with meds, trying to follow lower cholesterol diet, wt overall stable but little exercise however for now.   Overall good compliance with treatment, and good medicine tolerability.   Pt denies fever, wt loss, night sweats, loss of appetite, or other constitutional symptoms   Past Medical History  Diagnosis Date  . HYPERLIPIDEMIA 09/27/2010  . OBESITY 08/04/2007  . HYPERTENSION 08/06/2007  . BRADYCARDIA 09/26/2010  . INTERNAL HEMORRHOIDS 08/04/2007  . GASTROESOPHAGEAL REFLUX DISEASE 08/04/2007  . ABDOMINAL INCISIONAL HERNIA 08/13/2009  . DIVERTICULOSIS, COLON 08/25/2007  . LOW BACK PAIN 08/06/2007  . BACK PAIN 04/09/2009  . ARM PAIN, LEFT 06/05/2010  . OSTEOPENIA 08/04/2007  . RASH-NONVESICULAR 08/30/2008  . WEIGHT LOSS 08/13/2009  . COLONIC POLYPS, HX OF 08/25/2007   Past Surgical History  Procedure Date  . Nephrectomy     Right  . S/o incisional hernia repair     RUQ    reports that she has never smoked. She does not have any smokeless  tobacco history on file. She reports that she does not drink alcohol or use illicit drugs. family history includes Coronary artery disease (age of onset:60) in her other and Diabetes in her other. Allergies  Allergen Reactions  . Hydrocodone     REACTION: lethargy   Current Outpatient Prescriptions on File Prior to Visit  Medication Sig Dispense Refill  . ALPRAZolam (XANAX) 0.5 MG tablet TAKE ONE TABLET BY MOUTH AT BEDTIME AS NEEDED  30 tablet  5  . amLODipine (NORVASC) 5 MG tablet Take 5 mg by mouth daily.        [name] aspirin 81 MG EC tablet Take 81 mg by mouth daily.        [name] estrogen, conjugated,-medroxyprogesterone (PREMPRO) 0.3-1.5 MG per tablet Take 1 tablet by mouth daily.        . lansoprazole (PREVACID) 30 MG capsule Take 30 mg by mouth daily.        [name] levothyroxine (SYNTHROID, LEVOTHROID) 25 MCG tablet Take 25 mcg by mouth daily.        . pimecrolimus (ELIDEL) 1 % cream Apply topically 2 (two) times daily.         Review of Systems All otherwise neg per pt     Objective:   Physical Exam BP 150/82  Pulse 57  Temp(Src) 98.8 F (37.1 C) (Oral)  Ht 5\' 7"  (1.702 m)  Wt 146 lb (66.225 kg)  BMI 22.87 kg/m2  SpO2 98% Physical Exam  VS  noted Constitutional: Pt appears well-developed and well-nourished.  HENT: Head: Normocephalic.  Right Ear: External ear normal.  Left Ear: External ear normal.  Eyes: Conjunctivae and EOM are normal. Pupils are equal, round, and reactive to light.  Neck: Normal range of motion. Neck supple.  Cardiovascular: Normal rate and regular rhythm.   Pulmonary/Chest: Effort normal and breath sounds normal.  Abd:  Soft, NT, non-distended, + BS Neurological: Pt is alert. No cranial nerve deficit.  Skin: Skin is warm. No erythema.  Psychiatric: Pt behavior is normal. Thought content normal.         Assessment & Plan:

## 2011-03-29 ENCOUNTER — Encounter: Payer: Self-pay | Admitting: Internal Medicine

## 2011-04-10 ENCOUNTER — Telehealth: Payer: Self-pay | Admitting: Internal Medicine

## 2011-04-10 MED ORDER — AMLODIPINE BESYLATE 10 MG PO TABS: 10.0000 mg | ORAL_TABLET | Freq: Every day | ORAL | Status: DC

## 2011-04-10 NOTE — Telephone Encounter (Signed)
Pt advised.

## 2011-04-10 NOTE — Telephone Encounter (Signed)
receieved information from pt , written with elev SBP;  Ok to incr the amlodipine to 10 mg  New rx done per [employer]

## 2011-04-10 NOTE — Telephone Encounter (Signed)
Left message on machine for pt to return my call  

## 2011-09-24 ENCOUNTER — Other Ambulatory Visit (INDEPENDENT_AMBULATORY_CARE_PROVIDER_SITE_OTHER): Payer: Managed Care, Other (non HMO)

## 2011-09-24 DIAGNOSIS — Z0000 Encounter for general adult medical examination without abnormal findings: Secondary | ICD-10-CM

## 2011-09-24 LAB — CBC WITH DIFFERENTIAL/PLATELET
Eosinophils Absolute: 0.1 10*3/uL (ref 0.0–0.7)
Eosinophils Relative: 2.2 % (ref 0.0–5.0)
HCT: 39.2 % (ref 36.0–46.0)
Lymphs Abs: 1.3 10*3/uL (ref 0.7–4.0)
MCHC: 34.2 g/dL (ref 30.0–36.0)
MCV: 97.5 fl (ref 78.0–100.0)
Monocytes Absolute: 0.5 10*3/uL (ref 0.1–1.0)
Neutrophils Relative %: 69.2 % (ref 43.0–77.0)
Platelets: 215 10*3/uL (ref 150.0–400.0)
RDW: 13.6 % (ref 11.5–14.6)
WBC: 6.2 10*3/uL (ref 4.5–10.5)

## 2011-09-24 LAB — BASIC METABOLIC PANEL
Chloride: 108 mEq/L (ref 96–112)
GFR: 54.26 mL/min — ABNORMAL LOW (ref >60.00)
Potassium: 4.5 mEq/L (ref 3.5–5.1)
Sodium: 142 mEq/L (ref 135–145)

## 2011-09-24 LAB — HEPATIC FUNCTION PANEL
ALT: 17 U/L (ref 0–35)
AST: 15 U/L (ref 0–37)
Alkaline Phosphatase: 53 U/L (ref 39–117)
Bilirubin, Direct: 0.1 mg/dL (ref 0.0–0.3)
Total Bilirubin: 0.6 mg/dL (ref 0.3–1.2)

## 2011-09-24 LAB — LIPID PANEL
Cholesterol: 183 mg/dL (ref 0–200)
LDL Cholesterol: 86 mg/dL (ref 0–99)
Total CHOL/HDL Ratio: 3
VLDL: 24.4 mg/dL (ref 0.0–40.0)

## 2011-09-24 LAB — URINALYSIS, ROUTINE W REFLEX MICROSCOPIC
Bilirubin Urine: NEGATIVE
Hgb urine dipstick: NEGATIVE
Ketones, ur: NEGATIVE
Leukocytes, UA: NEGATIVE
Total Protein, Urine: NEGATIVE
pH: 5.5 (ref 5.0–8.0)

## 2011-09-25 LAB — TSH: TSH: 4.12 u[IU]/mL (ref 0.35–5.50)

## 2011-09-28 ENCOUNTER — Ambulatory Visit (INDEPENDENT_AMBULATORY_CARE_PROVIDER_SITE_OTHER): Payer: Managed Care, Other (non HMO) | Admitting: Internal Medicine

## 2011-09-28 ENCOUNTER — Encounter: Payer: Self-pay | Admitting: Internal Medicine

## 2011-09-28 VITALS — BP 122/72 | HR 63 | Temp 97.8°F | Ht 67.0 in | Wt 146.4 lb

## 2011-09-28 DIAGNOSIS — Z905 Acquired absence of kidney: Secondary | ICD-10-CM

## 2011-09-28 DIAGNOSIS — J309 Allergic rhinitis, unspecified: Secondary | ICD-10-CM

## 2011-09-28 DIAGNOSIS — Z0000 Encounter for general adult medical examination without abnormal findings: Secondary | ICD-10-CM

## 2011-09-28 DIAGNOSIS — Z23 Encounter for immunization: Secondary | ICD-10-CM

## 2011-09-28 HISTORY — DX: Allergic rhinitis, unspecified: J30.9

## 2011-09-28 HISTORY — DX: Acquired absence of kidney: Z90.5

## 2011-09-28 MED ORDER — ALPRAZOLAM 0.5 MG PO TABS: 0.5000 mg | ORAL_TABLET | Freq: Every evening | ORAL | Status: DC | PRN

## 2011-09-28 MED ORDER — LEVOTHYROXINE SODIUM 25 MCG PO TABS: 25.0000 ug | ORAL_TABLET | Freq: Every day | ORAL | Status: DC

## 2011-09-28 MED ORDER — AMLODIPINE BESYLATE 10 MG PO TABS: 10.0000 mg | ORAL_TABLET | Freq: Every day | ORAL | Status: DC

## 2011-09-28 MED ORDER — LANSOPRAZOLE 30 MG PO CPDR: 30.0000 mg | DELAYED_RELEASE_CAPSULE | Freq: Every day | ORAL | Status: DC

## 2011-09-28 MED ORDER — FEXOFENADINE HCL 180 MG PO TABS: 180.0000 mg | ORAL_TABLET | Freq: Every day | ORAL | Status: DC

## 2011-09-28 NOTE — Progress Notes (Signed)
Subjective:    Patient ID: Carolyn Wells, female    DOB: [birth year], 70 y.o.   MRN: [id number]  HPI  Here for wellness and f/u;  Overall doing ok;  Pt denies CP, worsening SOB, DOE, wheezing, orthopnea, PND, worsening LE edema, palpitations, dizziness or syncope.  Pt denies neurological change such as new Headache, facial or extremity weakness.  Pt denies polydipsia, polyuria, or low sugar symptoms. Pt states overall good compliance with treatment and medications, good tolerability, and trying to follow lower cholesterol diet.  Pt denies worsening depressive symptoms, suicidal ideation or panic. No fever, wt loss, night sweats, loss of appetite, or other constitutional symptoms.  Pt states good ability with ADL's, low fall risk, home safety reviewed and adequate, no significant changes in hearing or vision, and occasionally active with exercise. Husband recently died sept 04-05-2011 with CHF, and she has had singificant grief, less active, more salads for dinner. Does have several wks ongoing nasal allergy symptoms with clear congestion, itch and sneeze, without fever, pain, ST, cough or wheezing. Past Medical History  Diagnosis Date  . HYPERLIPIDEMIA 09/27/2010  . OBESITY 08/04/2007  . HYPERTENSION 08/06/2007  . BRADYCARDIA 09/26/2010  . INTERNAL HEMORRHOIDS 08/04/2007  . GASTROESOPHAGEAL REFLUX DISEASE 08/04/2007  . ABDOMINAL INCISIONAL HERNIA 08/13/2009  . DIVERTICULOSIS, COLON 08/25/2007  . LOW BACK PAIN 08/06/2007  . BACK PAIN 04/09/2009  . ARM PAIN, LEFT 06/05/2010  . OSTEOPENIA 08/04/2007  . RASH-NONVESICULAR 08/30/2008  . WEIGHT LOSS 08/13/2009  . COLONIC POLYPS, HX OF 08/25/2007  . Solitary kidney, acquired 09/28/2011  . Allergic rhinitis, cause unspecified 09/28/2011   Past Surgical History  Procedure Date  . Nephrectomy     Right  . S/o incisional hernia repair     RUQ    reports that she has never smoked. She does not have any smokeless tobacco history on file. She reports that she  does not drink alcohol or use illicit drugs. family history includes Coronary artery disease (age of onset:60) in her other and Diabetes in her other. Allergies  Allergen Reactions  . Hydrocodone     REACTION: lethargy   Current Outpatient Prescriptions on File Prior to Visit  Medication Sig Dispense Refill  . aspirin 81 MG EC tablet Take 81 mg by mouth daily.        [name] estrogen, conjugated,-medroxyprogesterone (PREMPRO) 0.3-1.5 MG per tablet Take 1 tablet by mouth daily.         Review of Systems Review of Systems  Constitutional: Negative for diaphoresis, activity change, appetite change and unexpected weight change.  HENT: Negative for hearing loss, ear pain, facial swelling, mouth sores and neck stiffness.   Eyes: Negative for pain, redness and visual disturbance.  Respiratory: Negative for shortness of breath and wheezing.   Cardiovascular: Negative for chest pain and palpitations.  Gastrointestinal: Negative for diarrhea, blood in stool, abdominal distention and rectal pain.  Genitourinary: Negative for hematuria, flank pain and decreased urine volume.  Musculoskeletal: Negative for myalgias and joint swelling.  Skin: Negative for color change and wound.  Neurological: Negative for syncope and numbness.  Hematological: Negative for adenopathy.  Psychiatric/Behavioral: Negative for hallucinations, self-injury, decreased concentration and agitation.      Objective:   Physical Exam BP 122/72  Pulse 63  Temp(Src) 97.8 F (36.6 C) (Oral)  Ht 5\' 7"  (1.702 m)  Wt 146 lb 6 oz (66.395 kg)  BMI 22.93 kg/m2  SpO2 98% Physical Exam  VS noted Constitutional: Pt is oriented to person, place, and  time. Appears well-developed and well-nourished.  HENT:  Head: Normocephalic and atraumatic.  Right Ear: External ear normal.  Left Ear: External ear normal.  Nose: Nose normal.  Mouth/Throat: Oropharynx is clear and moist.  Eyes: Conjunctivae and EOM are normal. Pupils are equal, round,  and reactive to light.  Neck: Normal range of motion. Neck supple. No JVD present. No tracheal deviation present.  Cardiovascular: Normal rate, regular rhythm, normal heart sounds and intact distal pulses.   Pulmonary/Chest: Effort normal and breath sounds normal.  Abdominal: Soft. Bowel sounds are normal. There is no tenderness.  Musculoskeletal: Normal range of motion. Exhibits no edema.  Lymphadenopathy:  Has no cervical adenopathy.  Neurological: Pt is alert and oriented to person, place, and time. Pt has normal reflexes. No cranial nerve deficit.  Skin: Skin is warm and dry. No rash noted.  Psychiatric:  Has  normal mood and affect. Behavior is normal.     Assessment & Plan:

## 2011-09-28 NOTE — Assessment & Plan Note (Signed)
Pt prefers f/u colonoscopy for 2013

## 2011-09-28 NOTE — Patient Instructions (Addendum)
You had the flu shot today Take all new medications as prescribed Continue all other medications as before Your refills were all sent to the pharmacy, except for the alprazolam given hardcopy Please return in 1 year for your yearly visit, or sooner if needed, with Lab testing done 3-5 days before

## 2011-10-04 ENCOUNTER — Encounter: Payer: Self-pay | Admitting: Internal Medicine

## 2011-10-04 NOTE — Assessment & Plan Note (Signed)
Ok for allegra prn,  to f/u any worsening symptoms or concerns 

## 2011-10-04 NOTE — Assessment & Plan Note (Signed)

## 2012-03-15 ENCOUNTER — Encounter: Payer: Self-pay | Admitting: Internal Medicine

## 2012-05-20 ENCOUNTER — Encounter: Payer: Self-pay | Admitting: Internal Medicine

## 2012-05-20 ENCOUNTER — Ambulatory Visit (INDEPENDENT_AMBULATORY_CARE_PROVIDER_SITE_OTHER): Payer: Managed Care, Other (non HMO) | Admitting: Internal Medicine

## 2012-05-20 VITALS — BP 132/90 | HR 62 | Temp 97.1°F | Ht 67.0 in | Wt 155.0 lb

## 2012-05-20 DIAGNOSIS — I10 Essential (primary) hypertension: Secondary | ICD-10-CM

## 2012-05-20 DIAGNOSIS — L259 Unspecified contact dermatitis, unspecified cause: Secondary | ICD-10-CM

## 2012-05-20 DIAGNOSIS — J309 Allergic rhinitis, unspecified: Secondary | ICD-10-CM

## 2012-05-20 MED ORDER — HYDROXYZINE HCL 25 MG PO TABS: ORAL_TABLET | ORAL | Status: DC

## 2012-05-20 MED ORDER — METHYLPREDNISOLONE ACETATE 80 MG/ML IJ SUSP
120.0000 mg | Freq: Once | INTRAMUSCULAR | Status: AC
  Administered 2012-05-20: 120 mg via INTRAMUSCULAR

## 2012-05-20 MED ORDER — LATANOPROST 0.005 % OP SOLN: 1.0000 [drp] | Freq: Every day | OPHTHALMIC | Status: AC

## 2012-05-20 MED ORDER — PREDNISONE 10 MG PO TABS: ORAL_TABLET | ORAL | Status: DC

## 2012-05-20 NOTE — Patient Instructions (Addendum)
You had the steroid shot today Take all new medications as prescribed - the prednisone, and generic atarax for itching You can also take zantac 150 mg twice per day to help the itching as well You can also use Caladryl topically (calamine and benadryl) to help with the itching Continue all other medications as before

## 2012-05-21 ENCOUNTER — Encounter: Payer: Self-pay | Admitting: Internal Medicine

## 2012-05-21 DIAGNOSIS — L259 Unspecified contact dermatitis, unspecified cause: Secondary | ICD-10-CM | POA: Insufficient documentation

## 2012-05-21 DIAGNOSIS — H409 Unspecified glaucoma: Secondary | ICD-10-CM | POA: Insufficient documentation

## 2012-05-21 NOTE — Progress Notes (Signed)
Subjective:    Patient ID: Carolyn Wells, female    DOB: [birth year], 71 y.o.   MRN: [id number]  HPI  Here after working in the yard in the last 3 days, with spreading mild to mod rash with intense pruritis to the arms and legs starting in the areas not covered while working in the yard, no fever, trauma, nothing makes better or worse.  Does have several wks ongoing nasal allergy symptoms with clear congestion, itch and sneeze, without fever, pain, ST, cough or wheezing.   Pt denies polydipsia, polyuria.   Pt denies fever, wt loss, night sweats, loss of appetite, or other constitutional symptoms  Using calamine but still itches.  Also menitons recent tx for early right gluacoma, as well as recent osteopenia by DXA per GYN, tx with Vit d.  Pt denies chest pain, increased sob or doe, wheezing, orthopnea, PND, increased LE swelling, palpitations, dizziness or syncope.  Denies worsening depressive symptoms, suicidal ideation, or panic. Past Medical History  Diagnosis Date  . HYPERLIPIDEMIA 09/27/2010  . OBESITY 08/04/2007  . HYPERTENSION 08/06/2007  . BRADYCARDIA 09/26/2010  . INTERNAL HEMORRHOIDS 08/04/2007  . GASTROESOPHAGEAL REFLUX DISEASE 08/04/2007  . ABDOMINAL INCISIONAL HERNIA 08/13/2009  . DIVERTICULOSIS, COLON 08/25/2007  . LOW BACK PAIN 08/06/2007  . BACK PAIN 04/09/2009  . ARM PAIN, LEFT 06/05/2010  . OSTEOPENIA 08/04/2007  . RASH-NONVESICULAR 08/30/2008  . WEIGHT LOSS 08/13/2009  . COLONIC POLYPS, HX OF 08/25/2007  . Solitary kidney, acquired 09/28/2011  . Allergic rhinitis, cause unspecified 09/28/2011   Past Surgical History  Procedure Date  . Nephrectomy     Right  . S/o incisional hernia repair     RUQ    reports that she has never smoked. She does not have any smokeless tobacco history on file. She reports that she does not drink alcohol or use illicit drugs. family history includes Coronary artery disease (age of onset:60) in her other and Diabetes in her other. Allergies    Allergen Reactions  . Hydrocodone     REACTION: lethargy   Current Outpatient Prescriptions on File Prior to Visit  Medication Sig Dispense Refill  . ALPRAZolam (XANAX) 0.5 MG tablet Take 1 tablet (0.5 mg total) by mouth at bedtime as needed for sleep.  30 tablet  5  . amLODipine (NORVASC) 10 MG tablet Take 1 tablet (10 mg total) by mouth daily.  90 tablet  3  . aspirin 81 MG EC tablet Take 81 mg by mouth daily.        [name] estrogen, conjugated,-medroxyprogesterone (PREMPRO) 0.3-1.5 MG per tablet Take 1 tablet by mouth daily.        . lansoprazole (PREVACID) 30 MG capsule Take 1 capsule (30 mg total) by mouth daily.  90 capsule  3  . levothyroxine (SYNTHROID, LEVOTHROID) 25 MCG tablet Take 1 tablet (25 mcg total) by mouth daily.  90 tablet  3  . fexofenadine (ALLEGRA) 180 MG tablet Take 1 tablet (180 mg total) by mouth daily.  90 tablet  3   Review of Systems Review of Systems  Constitutional: Negative for diaphoresis and unexpected weight change.  HENT: Negative for tinnitus.   Eyes: Negative for photophobia and visual disturbance.  Respiratory: Negative for cough  Gastrointestinal: Negative for vomiting and blood in stool.  Genitourinary: Negative for hematuria and decreased urine volume.  Musculoskeletal: Negative for gait problem.  Skin: Negative for color change and wound.  Neurological: Negative for tremors and numbness.    Objective:   Physical  Exam BP 132/90  Pulse 62  Temp 97.1 F (36.2 C) (Oral)  Ht 5\' 7"  (1.702 m)  Wt 155 lb (70.308 kg)  BMI 24.28 kg/m2  SpO2 97% Physical Exam  VS noted Constitutional: Pt appears well-developed and well-nourished.  HENT: Head: Normocephalic.  Right Ear: External ear normal.  Left Ear: External ear normal.  Eyes: Conjunctivae and EOM are normal. Pupils are equal, round, and reactive to light.  Neck: Normal range of motion. Neck supple.  Cardiovascular: Normal rate and regular rhythm.   Pulmonary/Chest: Effort normal and breath  sounds normal.  Neurological: Pt is alert. Skin: with typical numerous areas poison ivy type rash to extremities and upper torso, none to face Psychiatric: Pt behavior is normal. Thought content normal. 1+nervous    Assessment & Plan:

## 2012-05-21 NOTE — Assessment & Plan Note (Signed)
.  Mild to mod, for steroid tx as above today, cont allegra prn, to f/u any worsening symptoms or concerns

## 2012-05-21 NOTE — Assessment & Plan Note (Signed)
stable overall by hx and exam, most recent data reviewed with pt, and pt to continue medical treatment as before BP Readings from Last 3 Encounters:  05/20/12 132/90  09/28/11 122/72  03/27/11 150/82

## 2012-05-21 NOTE — Assessment & Plan Note (Signed)
Mild to mod, for antibx course,  to f/u any worsening symptoms or concerns, for caladryl otc prn

## 2012-07-06 ENCOUNTER — Telehealth: Payer: Self-pay | Admitting: Internal Medicine

## 2012-07-06 NOTE — Telephone Encounter (Signed)
Recall Project: Pt refusal

## 2012-09-16 ENCOUNTER — Other Ambulatory Visit: Payer: Self-pay | Admitting: Internal Medicine

## 2012-09-21 ENCOUNTER — Other Ambulatory Visit (INDEPENDENT_AMBULATORY_CARE_PROVIDER_SITE_OTHER): Payer: Managed Care, Other (non HMO)

## 2012-09-21 DIAGNOSIS — Z0000 Encounter for general adult medical examination without abnormal findings: Secondary | ICD-10-CM

## 2012-09-21 LAB — BASIC METABOLIC PANEL
Calcium: 10.2 mg/dL (ref 8.4–10.5)
GFR: 45.15 mL/min — ABNORMAL LOW (ref >60.00)
Glucose, Bld: 92 mg/dL (ref 70–99)
Potassium: 4.9 mEq/L (ref 3.5–5.1)
Sodium: 139 mEq/L (ref 135–145)

## 2012-09-21 LAB — LIPID PANEL
Cholesterol: 217 mg/dL — ABNORMAL HIGH (ref 0–200)
HDL: 77 mg/dL (ref >39.00)
Triglycerides: 137 mg/dL (ref 0.0–149.0)
VLDL: 27.4 mg/dL (ref 0.0–40.0)

## 2012-09-21 LAB — URINALYSIS, ROUTINE W REFLEX MICROSCOPIC
Bilirubin Urine: NEGATIVE
Hgb urine dipstick: NEGATIVE
Ketones, ur: NEGATIVE
Leukocytes, UA: NEGATIVE
Urine Glucose: NEGATIVE
Urobilinogen, UA: 0.2 (ref 0.0–1.0)

## 2012-09-21 LAB — CBC WITH DIFFERENTIAL/PLATELET
Basophils Absolute: 0 10*3/uL (ref 0.0–0.1)
HCT: 40.2 % (ref 36.0–46.0)
Hemoglobin: 13.2 g/dL (ref 12.0–15.0)
Lymphs Abs: 1.4 10*3/uL (ref 0.7–4.0)
MCV: 98 fl (ref 78.0–100.0)
Monocytes Absolute: 0.6 10*3/uL (ref 0.1–1.0)
Monocytes Relative: 8.7 % (ref 3.0–12.0)
Neutro Abs: 4.7 10*3/uL (ref 1.4–7.7)
Platelets: 213 10*3/uL (ref 150.0–400.0)
RDW: 13.3 % (ref 11.5–14.6)

## 2012-09-21 LAB — HEPATIC FUNCTION PANEL
ALT: 18 U/L (ref 0–35)
AST: 18 U/L (ref 0–37)
Albumin: 3.9 g/dL (ref 3.5–5.2)
Total Bilirubin: 0.7 mg/dL (ref 0.3–1.2)

## 2012-09-21 LAB — TSH: TSH: 3.91 u[IU]/mL (ref 0.35–5.50)

## 2012-09-28 ENCOUNTER — Ambulatory Visit (INDEPENDENT_AMBULATORY_CARE_PROVIDER_SITE_OTHER): Payer: Managed Care, Other (non HMO) | Admitting: Internal Medicine

## 2012-09-28 ENCOUNTER — Encounter: Payer: Managed Care, Other (non HMO) | Admitting: Internal Medicine

## 2012-09-28 ENCOUNTER — Encounter: Payer: Self-pay | Admitting: Internal Medicine

## 2012-09-28 VITALS — BP 132/80 | HR 57 | Temp 97.1°F | Ht 67.0 in | Wt 155.8 lb

## 2012-09-28 DIAGNOSIS — Z23 Encounter for immunization: Secondary | ICD-10-CM

## 2012-09-28 DIAGNOSIS — Z136 Encounter for screening for cardiovascular disorders: Secondary | ICD-10-CM

## 2012-09-28 DIAGNOSIS — Z0000 Encounter for general adult medical examination without abnormal findings: Secondary | ICD-10-CM

## 2012-09-28 MED ORDER — ALPRAZOLAM 0.5 MG PO TABS: 0.5000 mg | ORAL_TABLET | Freq: Every evening | ORAL | Status: DC | PRN

## 2012-09-28 MED ORDER — AMLODIPINE BESYLATE 10 MG PO TABS: 10.0000 mg | ORAL_TABLET | Freq: Every day | ORAL | Status: DC

## 2012-09-28 MED ORDER — LEVOTHYROXINE SODIUM 25 MCG PO TABS: 25.0000 ug | ORAL_TABLET | Freq: Every day | ORAL | Status: DC

## 2012-09-28 MED ORDER — LANSOPRAZOLE 30 MG PO CPDR: 30.0000 mg | DELAYED_RELEASE_CAPSULE | Freq: Every day | ORAL | Status: DC

## 2012-09-28 NOTE — Assessment & Plan Note (Signed)

## 2012-09-28 NOTE — Patient Instructions (Addendum)
You had the flu shot today Continue all other medications as before Your refills were done today Please have the pharmacy call with any other refills you may need. Your EKG was ok today You are given the copy of your blood work Please call if you change your mind about having the follow up colonoscopy Please remember to followup with your GYN for the yearly pap smear and/or mammogram You are otherwise up to date with prevention measures Please remember to sign up for My Chart at your earliest convenience, as this will be important to you in the future with finding out test results.

## 2012-09-28 NOTE — Progress Notes (Signed)
Subjective:    Patient ID: Carolyn Wells, female    DOB: 1941-04-29, 71 y.o.   MRN: [id number]  HPI  Here for wellness and f/u;  Overall doing ok;  Pt denies CP, worsening SOB, DOE, wheezing, orthopnea, PND, worsening LE edema, palpitations, dizziness or syncope.  Pt denies neurological change such as new Headache, facial or extremity weakness.  Pt denies polydipsia, polyuria, or low sugar symptoms. Pt states overall good compliance with treatment and medications, good tolerability, and trying to follow lower cholesterol diet.  Pt denies worsening depressive symptoms, suicidal ideation or panic. No fever, wt loss, night sweats, loss of appetite, or other constitutional symptoms.  Pt states good ability with ADL's, low fall risk, home safety reviewed and adequate, no significant changes in hearing or vision, and occasionally active with exercise.  Due for flu shot.  No acute complaints Declines colonoscopy Past Medical History  Diagnosis Date  . HYPERLIPIDEMIA 09/27/2010  . OBESITY 08/04/2007  . HYPERTENSION 08/06/2007  . BRADYCARDIA 09/26/2010  . INTERNAL HEMORRHOIDS 08/04/2007  . GASTROESOPHAGEAL REFLUX DISEASE 08/04/2007  . ABDOMINAL INCISIONAL HERNIA 08/13/2009  . DIVERTICULOSIS, COLON 08/25/2007  . LOW BACK PAIN 08/06/2007  . BACK PAIN 04/09/2009  . ARM PAIN, LEFT 06/05/2010  . OSTEOPENIA 08/04/2007  . RASH-NONVESICULAR 08/30/2008  . WEIGHT LOSS 08/13/2009  . COLONIC POLYPS, HX OF 08/25/2007  . Solitary kidney, acquired 09/28/2011  . Allergic rhinitis, cause unspecified 09/28/2011   Past Surgical History  Procedure Date  . Nephrectomy     Right  . S/o incisional hernia repair     RUQ    reports that she has never smoked. She does not have any smokeless tobacco history on file. She reports that she does not drink alcohol or use illicit drugs. family history includes Coronary artery disease (age of onset:60) in her other and Diabetes in her other. Allergies  Allergen Reactions  .  Hydrocodone     REACTION: lethargy   Current Outpatient Prescriptions on File Prior to Visit  Medication Sig Dispense Refill  . aspirin 81 MG EC tablet Take 81 mg by mouth daily.        [name] estrogen, conjugated,-medroxyprogesterone (PREMPRO) 0.3-1.5 MG per tablet Take 1 tablet by mouth daily.        . hydrOXYzine (ATARAX/VISTARIL) 25 MG tablet Take 1-2 tabs by mouth every 6 hours as needed for itching  60 tablet  1  . latanoprost (XALATAN) 0.005 % ophthalmic solution Place 1 drop into both eyes at bedtime.  2.5 mL  12  . [DISCONTINUED] amLODipine (NORVASC) 10 MG tablet TAKE ONE TABLET BY MOUTH EVERY DAY  90 tablet  1  . [DISCONTINUED] lansoprazole (PREVACID) 30 MG capsule Take 1 capsule (30 mg total) by mouth daily.  90 capsule  3  . [DISCONTINUED] levothyroxine (SYNTHROID, LEVOTHROID) 25 MCG tablet Take 1 tablet (25 mcg total) by mouth daily.  90 tablet  3  . fexofenadine (ALLEGRA) 180 MG tablet Take 1 tablet (180 mg total) by mouth daily.  90 tablet  3   Review of Systems Review of Systems  Constitutional: Negative for diaphoresis, activity change, appetite change and unexpected weight change.  HENT: Negative for hearing loss, ear pain, facial swelling, mouth sores and neck stiffness.   Eyes: Negative for pain, redness and visual disturbance.  Respiratory: Negative for shortness of breath and wheezing.   Cardiovascular: Negative for chest pain and palpitations.  Gastrointestinal: Negative for diarrhea, blood in stool, abdominal distention and rectal pain.  Genitourinary: Negative  for hematuria, flank pain and decreased urine volume.  Musculoskeletal: Negative for myalgias and joint swelling.  Skin: Negative for color change and wound.  Neurological: Negative for syncope and numbness.  Hematological: Negative for adenopathy.  Psychiatric/Behavioral: Negative for hallucinations, self-injury, decreased concentration and agitation.      Objective:   Physical Exam BP 132/80  Pulse 57   Temp 97.1 F (36.2 C) (Oral)  Ht 5\' 7"  (1.702 m)  Wt 155 lb 12 oz (70.648 kg)  BMI 24.39 kg/m2  SpO2 98% Physical Exam  VS noted Constitutional: Pt is oriented to person, place, and time. Appears well-developed and well-nourished.  HENT:  Head: Normocephalic and atraumatic.  Right Ear: External ear normal.  Left Ear: External ear normal.  Nose: Nose normal.  Mouth/Throat: Oropharynx is clear and moist.  Eyes: Conjunctivae and EOM are normal. Pupils are equal, round, and reactive to light.  Neck: Normal range of motion. Neck supple. No JVD present. No tracheal deviation present.  Cardiovascular: Normal rate, regular rhythm, normal heart sounds and intact distal pulses.   Pulmonary/Chest: Effort normal and breath sounds normal.  Abdominal: Soft. Bowel sounds are normal. There is no tenderness.  Musculoskeletal: Normal range of motion. Exhibits no edema.  Lymphadenopathy:  Has no cervical adenopathy.  Neurological: Pt is alert and oriented to person, place, and time. Pt has normal reflexes. No cranial nerve deficit.  Skin: Skin is warm and dry. No rash noted.  Psychiatric:  Has  normal mood and affect. Behavior is normal.     Assessment & Plan:

## 2012-10-20 ENCOUNTER — Telehealth: Payer: Self-pay

## 2012-10-20 MED ORDER — PIMECROLIMUS 1 % EX CREA: TOPICAL_CREAM | Freq: Two times a day (BID) | CUTANEOUS | Status: DC

## 2012-10-20 NOTE — Telephone Encounter (Signed)
Pharmacy requesting refill on Elidel 1% cream 30 gm currently no on list please advise

## 2012-10-20 NOTE — Telephone Encounter (Signed)
Done erx 

## 2013-03-03 LAB — HM MAMMOGRAPHY

## 2013-03-15 ENCOUNTER — Encounter: Payer: Self-pay | Admitting: Internal Medicine

## 2013-09-26 ENCOUNTER — Telehealth: Payer: Self-pay | Admitting: Internal Medicine

## 2013-09-26 ENCOUNTER — Other Ambulatory Visit (INDEPENDENT_AMBULATORY_CARE_PROVIDER_SITE_OTHER): Payer: Managed Care, Other (non HMO)

## 2013-09-26 DIAGNOSIS — Z0000 Encounter for general adult medical examination without abnormal findings: Secondary | ICD-10-CM

## 2013-09-26 LAB — CBC WITH DIFFERENTIAL/PLATELET
Basophils Absolute: 0 10*3/uL (ref 0.0–0.1)
Hemoglobin: 13.6 g/dL (ref 12.0–15.0)
Lymphocytes Relative: 22.2 % (ref 12.0–46.0)
Monocytes Relative: 7 % (ref 3.0–12.0)
Neutro Abs: 5.7 10*3/uL (ref 1.4–7.7)
Neutrophils Relative %: 69.1 % (ref 43.0–77.0)
RDW: 13.5 % (ref 11.5–14.6)

## 2013-09-26 LAB — URINALYSIS, ROUTINE W REFLEX MICROSCOPIC
Bilirubin Urine: NEGATIVE
Hgb urine dipstick: NEGATIVE
Ketones, ur: NEGATIVE
RBC / HPF: NONE SEEN (ref 0–?)
Total Protein, Urine: NEGATIVE
Urine Glucose: NEGATIVE

## 2013-09-26 LAB — HEPATIC FUNCTION PANEL
ALT: 19 U/L (ref 0–35)
Albumin: 3.9 g/dL (ref 3.5–5.2)
Alkaline Phosphatase: 48 U/L (ref 39–117)
Total Protein: 7.1 g/dL (ref 6.0–8.3)

## 2013-09-26 LAB — BASIC METABOLIC PANEL
CO2: 28 mEq/L (ref 19–32)
Calcium: 9.9 mg/dL (ref 8.4–10.5)
Creatinine, Ser: 1.2 mg/dL (ref 0.4–1.2)
Glucose, Bld: 61 mg/dL — ABNORMAL LOW (ref 70–99)
Sodium: 138 mEq/L (ref 135–145)

## 2013-09-26 LAB — LIPID PANEL
Cholesterol: 221 mg/dL — ABNORMAL HIGH (ref 0–200)
Triglycerides: 118 mg/dL (ref 0.0–149.0)

## 2013-09-26 LAB — LDL CHOLESTEROL, DIRECT: Direct LDL: 117.7 mg/dL

## 2013-09-26 LAB — TSH: TSH: 5.2 u[IU]/mL (ref 0.35–5.50)

## 2013-09-26 NOTE — Telephone Encounter (Signed)
Message copied by Corwin Levins on Tue Sep 26, 2013  1:00 PM ------      Message from: Scharlene Gloss B      Created: Tue Sep 26, 2013 11:55 AM       The patient was in the lab to get blood drawn this am for next appointment.  Please advise on what to order. ------

## 2013-09-26 NOTE — Telephone Encounter (Signed)
Labs ordered.

## 2013-09-28 ENCOUNTER — Ambulatory Visit (INDEPENDENT_AMBULATORY_CARE_PROVIDER_SITE_OTHER): Payer: Managed Care, Other (non HMO) | Admitting: Internal Medicine

## 2013-09-28 ENCOUNTER — Encounter: Payer: Self-pay | Admitting: Internal Medicine

## 2013-09-28 VITALS — BP 124/82 | HR 79 | Temp 98.2°F | Ht 67.0 in | Wt 153.1 lb

## 2013-09-28 DIAGNOSIS — F411 Generalized anxiety disorder: Secondary | ICD-10-CM

## 2013-09-28 DIAGNOSIS — Z23 Encounter for immunization: Secondary | ICD-10-CM

## 2013-09-28 DIAGNOSIS — Z136 Encounter for screening for cardiovascular disorders: Secondary | ICD-10-CM

## 2013-09-28 DIAGNOSIS — J069 Acute upper respiratory infection, unspecified: Secondary | ICD-10-CM | POA: Insufficient documentation

## 2013-09-28 DIAGNOSIS — Z0000 Encounter for general adult medical examination without abnormal findings: Secondary | ICD-10-CM

## 2013-09-28 DIAGNOSIS — F419 Anxiety disorder, unspecified: Secondary | ICD-10-CM

## 2013-09-28 DIAGNOSIS — Z905 Acquired absence of kidney: Secondary | ICD-10-CM

## 2013-09-28 DIAGNOSIS — E785 Hyperlipidemia, unspecified: Secondary | ICD-10-CM

## 2013-09-28 MED ORDER — AZITHROMYCIN 250 MG PO TABS: ORAL_TABLET | ORAL | Status: DC

## 2013-09-28 MED ORDER — AMLODIPINE BESYLATE 10 MG PO TABS: 10.0000 mg | ORAL_TABLET | Freq: Every day | ORAL | Status: DC

## 2013-09-28 MED ORDER — LEVOTHYROXINE SODIUM 25 MCG PO TABS: 25.0000 ug | ORAL_TABLET | Freq: Every day | ORAL | Status: DC

## 2013-09-28 MED ORDER — LANSOPRAZOLE 30 MG PO CPDR: 30.0000 mg | DELAYED_RELEASE_CAPSULE | Freq: Every day | ORAL | Status: DC

## 2013-09-28 MED ORDER — ALPRAZOLAM 0.5 MG PO TABS: 0.5000 mg | ORAL_TABLET | Freq: Every evening | ORAL | Status: DC | PRN

## 2013-09-28 NOTE — Assessment & Plan Note (Signed)

## 2013-09-28 NOTE — Assessment & Plan Note (Signed)
Mild to mod, for antibx course,  to f/u any worsening symptoms or concerns 

## 2013-09-28 NOTE — Assessment & Plan Note (Signed)
With mild LDL elev, still declines statin though goal < 100

## 2013-09-28 NOTE — Patient Instructions (Addendum)
You had the Prevnar shot today Please take all new medication as prescribed  - the antibiotic Please continue all other medications as before, and refills have been done if requested - the alprazolam Please have the pharmacy call with any other refills you may need. You can also take Delsym OTC for cough, and/or Mucinex (or it's generic off brand) for congestion, and tylenol as needed for pain. Please continue your efforts at being more active, low cholesterol diet, and weight control. You are otherwise up to date with prevention measures today. Please keep your appointments with your specialists as you may have planned  Please remember to sign up for My Chart if you have not done so, as this will be important to you in the future with finding out test results, communicating by private email, and scheduling acute appointments online when needed.  Please return in 6 months, or sooner if needed

## 2013-09-28 NOTE — Progress Notes (Signed)
Subjective:    Patient ID: Carolyn Wells, female    DOB: [birth year], 72 y.o.   MRN: [id number]  HPI  Here for wellness and f/u;  Overall doing ok;  Pt denies CP, worsening SOB, DOE, wheezing, orthopnea, PND, worsening LE edema, palpitations, dizziness or syncope.  Pt denies neurological change such as new headache, facial or extremity weakness.  Pt denies polydipsia, polyuria, or low sugar symptoms. Pt states overall good compliance with treatment and medications, good tolerability, and has been trying to follow lower cholesterol diet.  Pt denies worsening depressive symptoms, suicidal ideation or panic. No fever, night sweats, wt loss, loss of appetite, or other constitutional symptoms.  Pt states good ability with ADL's, has low fall risk, home safety reviewed and adequate, no other significant changes in hearing or vision, and only occasionally active with exercise. Incidentally with  Here with 2-3 days acute onset fever, facial pain, pressure, headache, general weakness and malaise, and greenish d/c, with mild ST and cough, but pt denies chest pain, wheezing, increased sob or doe, orthopnea, PND, increased LE swelling, palpitations, dizziness or syncope. Pt continues to have recurring left LBP without change in severity, bowel or bladder change, fever, wt loss,  worsening LE pain/numbness/weakness, gait change or falls.  Past Medical History  Diagnosis Date  . HYPERLIPIDEMIA 09/27/2010  . OBESITY 08/04/2007  . HYPERTENSION 08/06/2007  . BRADYCARDIA 09/26/2010  . INTERNAL HEMORRHOIDS 08/04/2007  . GASTROESOPHAGEAL REFLUX DISEASE 08/04/2007  . ABDOMINAL INCISIONAL HERNIA 08/13/2009  . DIVERTICULOSIS, COLON 08/25/2007  . LOW BACK PAIN 08/06/2007  . BACK PAIN 04/09/2009  . ARM PAIN, LEFT 06/05/2010  . OSTEOPENIA 08/04/2007  . RASH-NONVESICULAR 08/30/2008  . WEIGHT LOSS 08/13/2009  . COLONIC POLYPS, HX OF 08/25/2007  . Solitary kidney, acquired 09/28/2011  . Allergic rhinitis, cause unspecified  09/28/2011   Past Surgical History  Procedure Laterality Date  . Nephrectomy      Right  . S/o incisional hernia repair      RUQ    reports that she has never smoked. She does not have any smokeless tobacco history on file. She reports that she does not drink alcohol or use illicit drugs. family history includes Coronary artery disease (age of onset: 34) in her other; Diabetes in her other. Allergies  Allergen Reactions  . Hydrocodone     REACTION: lethargy   Current Outpatient Prescriptions on File Prior to Visit  Medication Sig Dispense Refill  . ALPRAZolam (XANAX) 0.5 MG tablet Take 1 tablet (0.5 mg total) by mouth at bedtime as needed for sleep.  30 tablet  5  . aspirin 81 MG EC tablet Take 81 mg by mouth daily.        [name] estrogen, conjugated,-medroxyprogesterone (PREMPRO) 0.3-1.5 MG per tablet Take 1 tablet by mouth daily.        . hydrOXYzine (ATARAX/VISTARIL) 25 MG tablet Take 1-2 tabs by mouth every 6 hours as needed for itching  60 tablet  1  . pimecrolimus (ELIDEL) 1 % cream Apply topically 2 (two) times daily.  30 g  0  . fexofenadine (ALLEGRA) 180 MG tablet Take 1 tablet (180 mg total) by mouth daily.  90 tablet  3   No current facility-administered medications on file prior to visit.   Review of Systems Constitutional: Negative for diaphoresis, activity change, appetite change or unexpected weight change.  HENT: Negative for hearing loss, ear pain, facial swelling, mouth sores and neck stiffness.   Eyes: Negative for pain, redness and visual disturbance.  Respiratory: Negative for shortness of breath and wheezing.   Cardiovascular: Negative for chest pain and palpitations.  Gastrointestinal: Negative for diarrhea, blood in stool, abdominal distention or other pain Genitourinary: Negative for hematuria, flank pain or change in urine volume.  Musculoskeletal: Negative for myalgias and joint swelling.  Skin: Negative for color change and wound.  Neurological: Negative  for syncope and numbness. other than noted Hematological: Negative for adenopathy.  Psychiatric/Behavioral: Negative for hallucinations, self-injury, decreased concentration and agitation.      Objective:   Physical Exam BP 124/82  Pulse 79  Temp(Src) 98.2 F (36.8 C) (Oral)  Ht 5\' 7"  (1.702 m)  Wt 153 lb 2 oz (69.457 kg)  BMI 23.98 kg/m2  SpO2 97% VS noted, mild ill Constitutional: Pt is oriented to person, place, and time. Appears well-developed and well-nourished.  Head: Normocephalic and atraumatic.  Right Ear: External ear normal.  Left Ear: External ear normal.  Nose: Nose normal.  Mouth/Throat: Oropharynx is clear and moist.  Bilat tm's with mild erythema.  Max sinus areas non tender.  Pharynx with mild erythema, no exudate Eyes: Conjunctivae and EOM are normal. Pupils are equal, round, and reactive to light.  Neck: Normal range of motion. Neck supple. No JVD present. No tracheal deviation present.  Cardiovascular: Normal rate, regular rhythm, normal heart sounds and intact distal pulses.   Pulmonary/Chest: Effort normal and breath sounds normal.  Abdominal: Soft. Bowel sounds are normal. There is no tenderness. No HSM  Musculoskeletal: Normal range of motion. Exhibits no edema.  Lymphadenopathy:  Has no cervical adenopathy.  Neurological: Pt is alert and oriented to person, place, and time. Pt has normal reflexes. No cranial nerve deficit.  Skin: Skin is warm and dry. No rash noted.  Psychiatric:  Has  normal mood and affect. Behavior is normal.     Assessment & Plan:

## 2013-09-28 NOTE — Assessment & Plan Note (Signed)
stable overall by history and exam, recent data reviewed with pt, and pt to continue medical treatment as before,  to f/u any worsening symptoms or concerns  .lsatfp

## 2013-09-28 NOTE — Progress Notes (Signed)
Pre-visit discussion using our clinic review tool. No additional management support is needed unless otherwise documented below in the visit note.  

## 2013-09-28 NOTE — Addendum Note (Signed)
Addended by: Scharlene Gloss B on: 09/28/2013 10:00 AM   Modules accepted: Orders

## 2013-09-28 NOTE — Assessment & Plan Note (Signed)
stable overall by history and exam, recent data reviewed with pt, and pt to continue medical treatment as before,  to f/u any worsening symptoms or concerns Lab Results  Component Value Date   WBC 8.3 09/26/2013   HGB 13.6 09/26/2013   HCT 40.6 09/26/2013   PLT 238.0 09/26/2013   GLUCOSE 61* 09/26/2013   CHOL 221* 09/26/2013   TRIG 118.0 09/26/2013   HDL 75.60 09/26/2013   LDLDIRECT 117.7 09/26/2013   LDLCALC 86 09/24/2011   ALT 19 09/26/2013   AST 16 09/26/2013   NA 138 09/26/2013   K 3.6 09/26/2013   CL 101 09/26/2013   CREATININE 1.2 09/26/2013   BUN 16 09/26/2013   CO2 28 09/26/2013   TSH 5.20 09/26/2013   INR 1.03 10/22/2009

## 2014-03-07 ENCOUNTER — Ambulatory Visit (INDEPENDENT_AMBULATORY_CARE_PROVIDER_SITE_OTHER): Payer: Managed Care, Other (non HMO) | Admitting: Internal Medicine

## 2014-03-07 ENCOUNTER — Telehealth: Payer: Self-pay | Admitting: Internal Medicine

## 2014-03-07 ENCOUNTER — Encounter: Payer: Self-pay | Admitting: Internal Medicine

## 2014-03-07 VITALS — BP 130/80 | HR 69 | Temp 98.0°F | Ht 67.0 in | Wt 156.5 lb

## 2014-03-07 DIAGNOSIS — M545 Low back pain, unspecified: Secondary | ICD-10-CM

## 2014-03-07 DIAGNOSIS — I10 Essential (primary) hypertension: Secondary | ICD-10-CM

## 2014-03-07 DIAGNOSIS — Z905 Acquired absence of kidney: Secondary | ICD-10-CM

## 2014-03-07 DIAGNOSIS — M519 Unspecified thoracic, thoracolumbar and lumbosacral intervertebral disc disorder: Secondary | ICD-10-CM | POA: Insufficient documentation

## 2014-03-07 MED ORDER — TIZANIDINE HCL 4 MG PO TABS: 4.0000 mg | ORAL_TABLET | Freq: Four times a day (QID) | ORAL | Status: DC | PRN

## 2014-03-07 MED ORDER — PREDNISONE 10 MG PO TABS: ORAL_TABLET | ORAL | Status: DC

## 2014-03-07 MED ORDER — TRAMADOL HCL 50 MG PO TABS: 50.0000 mg | ORAL_TABLET | Freq: Three times a day (TID) | ORAL | Status: DC | PRN

## 2014-03-07 NOTE — Progress Notes (Signed)
Subjective:    Patient ID: Carolyn Wells, female    DOB: 11/19/1940, 73 y.o.   MRN: [id number]  HPI  Here to f/u with acute on chronic recurring LBP - Pt continues to have recurring LBP without change in severity, bowel or bladder change, fever, wt loss,  worsening LE pain/numbness/weakness, gait change or falls, except for last 3 days with flare of mod to severe pain left lower back, radiation to buttock and prox post thigh.  No rash, red, swelling.  Has hx of lumbar disc disease s/p surgury, also with ESI per Dr Brien Few several yrs ago that helped.  Pain worse to walk such that she has to sit after vacuuming or doing dishes.  Daughter's celebrex 200 qd for 3 days has helped, has solitary kidney Past Medical History  Diagnosis Date  . HYPERLIPIDEMIA 09/27/2010  . OBESITY 08/04/2007  . HYPERTENSION 08/06/2007  . BRADYCARDIA 09/26/2010  . INTERNAL HEMORRHOIDS 08/04/2007  . GASTROESOPHAGEAL REFLUX DISEASE 08/04/2007  . ABDOMINAL INCISIONAL HERNIA 08/13/2009  . DIVERTICULOSIS, COLON 08/25/2007  . LOW BACK PAIN 08/06/2007  . BACK PAIN 04/09/2009  . ARM PAIN, LEFT 06/05/2010  . OSTEOPENIA 08/04/2007  . RASH-NONVESICULAR 08/30/2008  . WEIGHT LOSS 08/13/2009  . COLONIC POLYPS, HX OF 08/25/2007  . Solitary kidney, acquired 09/28/2011  . Allergic rhinitis, cause unspecified 09/28/2011   Past Surgical History  Procedure Laterality Date  . Nephrectomy      Right  . S/o incisional hernia repair      RUQ    reports that she has never smoked. She does not have any smokeless tobacco history on file. She reports that she does not drink alcohol or use illicit drugs. family history includes Coronary artery disease (age of onset: 42) in her other; Diabetes in her other. Allergies  Allergen Reactions  . Hydrocodone     REACTION: lethargy   Current Outpatient Prescriptions on File Prior to Visit  Medication Sig Dispense Refill  . ALPRAZolam (XANAX) 0.5 MG tablet Take 1 tablet (0.5 mg total) by mouth at  bedtime as needed for sleep.  30 tablet  5  . amLODipine (NORVASC) 10 MG tablet Take 1 tablet (10 mg total) by mouth daily.  90 tablet  3  . aspirin 81 MG EC tablet Take 81 mg by mouth daily.        . bimatoprost (LUMIGAN) 0.03 % ophthalmic solution 1 drop at bedtime.      . Calcium Citrate (CITRACAL PO) Take by mouth daily.      . lansoprazole (PREVACID) 30 MG capsule Take 1 capsule (30 mg total) by mouth daily.  90 capsule  3  . levothyroxine (SYNTHROID, LEVOTHROID) 25 MCG tablet Take 1 tablet (25 mcg total) by mouth daily.  90 tablet  3  . Multiple Vitamin (MULTIVITAMIN) tablet Take 1 tablet by mouth daily.      . pimecrolimus (ELIDEL) 1 % cream Apply topically 2 (two) times daily.  30 g  0  . fexofenadine (ALLEGRA) 180 MG tablet Take 1 tablet (180 mg total) by mouth daily.  90 tablet  3   No current facility-administered medications on file prior to visit.    Review of Systems  Constitutional: Negative for unusual diaphoresis or other sweats  HENT: Negative for ringing in ear Eyes: Negative for double vision or worsening visual disturbance.  Respiratory: Negative for choking and stridor.   Gastrointestinal: Negative for vomiting or other signifcant bowel change Genitourinary: Negative for hematuria or decreased urine volume.  Musculoskeletal: Negative for other MSK pain or swelling Skin: Negative for color change and worsening wound.  Neurological: Negative for tremors and numbness other than noted  Psychiatric/Behavioral: Negative for decreased concentration or agitation other than above       Objective:   Physical Exam BP 130/80  Pulse 69  Temp(Src) 98 F (36.7 C) (Oral)  Ht 5\' 7"  (1.702 m)  Wt 156 lb 8 oz (70.988 kg)  BMI 24.51 kg/m2  SpO2 98% VS noted,  Constitutional: Pt appears well-developed, well-nourished.  HENT: Head: NCAT.  Right Ear: External ear normal.  Left Ear: External ear normal.  Eyes: . Pupils are equal, round, and reactive to light. Conjunctivae and  EOM are normal Neck: Normal range of motion. Neck supple.  Cardiovascular: Normal rate and regular rhythm.   Pulmonary/Chest: Effort normal and breath sounds normal.  Abd:  Soft, NT, ND, + BS Spine with scoliosis upper lumbar, scar noted lower lumbar midline, spine itself NT, has significant left paravertebral tender/spasm noted, no buttock tender Neurological: Pt is alert. Not confused , motor grossly intact, dtr/gait intact Skin: Skin is warm. No rash Psychiatric: Pt behavior is normal. No agitation.     Assessment & Plan:

## 2014-03-07 NOTE — Patient Instructions (Signed)
Please take all new medication as prescribed - the prednisone, pain medication,and muscle relaxer  Please continue all other medications as before, and refills have been done if requested. Please have the pharmacy call with any other refills you may need.  You will be contacted regarding the referral for: Dr Brien Few  Please keep your appointments with your specialists as you may have planned

## 2014-03-07 NOTE — Progress Notes (Signed)
Pre visit review using our clinic review tool, if applicable. No additional management support is needed unless otherwise documented below in the visit note. 

## 2014-03-07 NOTE — Assessment & Plan Note (Signed)
stable overall by history and exam, recent data reviewed with pt, and pt to continue medical treatment as before,  to f/u any worsening symptoms or concerns BP Readings from Last 3 Encounters:  03/07/14 130/80  09/28/13 124/82  09/28/12 132/80

## 2014-03-07 NOTE — Telephone Encounter (Signed)
Relevant patient education assigned to patient using Emmi. ° °

## 2014-03-07 NOTE — Assessment & Plan Note (Signed)
I would prefer hold on celebrex prn to avoid potential of renal dysfxn

## 2014-03-07 NOTE — Assessment & Plan Note (Signed)
Likely flare of underlying lumbar djd/ddd also with muscle spasm on exam; for predpack asd, tramadol prn, an muscle relaxer prn, also refer Dr Brien Few per pt request in case ESI needed again

## 2014-03-29 ENCOUNTER — Ambulatory Visit: Payer: Managed Care, Other (non HMO) | Admitting: Internal Medicine

## 2014-04-16 ENCOUNTER — Other Ambulatory Visit: Payer: Self-pay | Admitting: Neurosurgery

## 2014-04-16 DIAGNOSIS — M5416 Radiculopathy, lumbar region: Secondary | ICD-10-CM

## 2014-04-18 ENCOUNTER — Ambulatory Visit
Admission: RE | Admit: 2014-04-18 | Discharge: 2014-04-18 | Disposition: A | Discharge status: Home / Self Care | Payer: Medicare HMO | Source: Ambulatory Visit | Attending: Neurosurgery | Admitting: Neurosurgery

## 2014-04-18 ENCOUNTER — Ambulatory Visit
Admission: RE | Admit: 2014-04-18 | Discharge: 2014-04-18 | Disposition: A | Discharge status: Home / Self Care | Source: Ambulatory Visit | Attending: Neurosurgery | Admitting: Neurosurgery

## 2014-04-18 VITALS — BP 158/74 | HR 62

## 2014-04-18 DIAGNOSIS — M5416 Radiculopathy, lumbar region: Secondary | ICD-10-CM

## 2014-04-18 DIAGNOSIS — M519 Unspecified thoracic, thoracolumbar and lumbosacral intervertebral disc disorder: Secondary | ICD-10-CM

## 2014-04-18 MED ORDER — DIAZEPAM 5 MG PO TABS
5.0000 mg | ORAL_TABLET | Freq: Once | ORAL | Status: AC
  Administered 2014-04-18: 5 mg via ORAL

## 2014-04-18 MED ORDER — IOHEXOL 180 MG/ML  SOLN
15.0000 mL | Freq: Once | INTRAMUSCULAR | Status: AC | PRN
  Administered 2014-04-18: 15 mL via INTRATHECAL

## 2014-04-18 NOTE — Discharge Instructions (Signed)

## 2014-05-23 DIAGNOSIS — M48061 Spinal stenosis, lumbar region without neurogenic claudication: Secondary | ICD-10-CM | POA: Insufficient documentation

## 2014-08-24 ENCOUNTER — Other Ambulatory Visit (INDEPENDENT_AMBULATORY_CARE_PROVIDER_SITE_OTHER): Payer: Medicare HMO

## 2014-08-24 ENCOUNTER — Ambulatory Visit (INDEPENDENT_AMBULATORY_CARE_PROVIDER_SITE_OTHER): Payer: Medicare HMO | Admitting: Internal Medicine

## 2014-08-24 ENCOUNTER — Encounter: Payer: Self-pay | Admitting: Internal Medicine

## 2014-08-24 VITALS — BP 140/80 | HR 76 | Temp 97.8°F | Wt 156.0 lb

## 2014-08-24 DIAGNOSIS — M5010 Cervical disc disorder with radiculopathy, unspecified cervical region: Secondary | ICD-10-CM | POA: Diagnosis not present

## 2014-08-24 DIAGNOSIS — K219 Gastro-esophageal reflux disease without esophagitis: Secondary | ICD-10-CM

## 2014-08-24 DIAGNOSIS — M545 Low back pain: Secondary | ICD-10-CM | POA: Diagnosis not present

## 2014-08-24 DIAGNOSIS — Z79899 Other long term (current) drug therapy: Secondary | ICD-10-CM

## 2014-08-24 DIAGNOSIS — G8929 Other chronic pain: Secondary | ICD-10-CM

## 2014-08-24 DIAGNOSIS — F419 Anxiety disorder, unspecified: Secondary | ICD-10-CM

## 2014-08-24 DIAGNOSIS — Z0000 Encounter for general adult medical examination without abnormal findings: Secondary | ICD-10-CM

## 2014-08-24 DIAGNOSIS — I10 Essential (primary) hypertension: Secondary | ICD-10-CM

## 2014-08-24 LAB — URINALYSIS, ROUTINE W REFLEX MICROSCOPIC
Bilirubin Urine: NEGATIVE
HGB URINE DIPSTICK: NEGATIVE
KETONES UR: NEGATIVE
Leukocytes, UA: NEGATIVE
Nitrite: NEGATIVE
Specific Gravity, Urine: 1.02 (ref 1.000–1.030)
Total Protein, Urine: NEGATIVE
UROBILINOGEN UA: 0.2 (ref 0.0–1.0)
Urine Glucose: NEGATIVE
pH: 6.5 (ref 5.0–8.0)

## 2014-08-24 LAB — BASIC METABOLIC PANEL
BUN: 21 mg/dL (ref 6–23)
CALCIUM: 10.5 mg/dL (ref 8.4–10.5)
CO2: 26 mEq/L (ref 19–32)
Chloride: 101 mEq/L (ref 96–112)
Creatinine, Ser: 1.4 mg/dL — ABNORMAL HIGH (ref 0.4–1.2)
GFR: 40.03 mL/min — ABNORMAL LOW (ref >60.00)
GLUCOSE: 97 mg/dL (ref 70–99)
Potassium: 4.7 mEq/L (ref 3.5–5.1)
SODIUM: 138 meq/L (ref 135–145)

## 2014-08-24 LAB — LIPID PANEL
CHOL/HDL RATIO: 3
Cholesterol: 247 mg/dL — ABNORMAL HIGH (ref 0–200)
HDL: 82.5 mg/dL (ref >39.00)
LDL CALC: 146 mg/dL — AB (ref 0–99)
NonHDL: 164.5
TRIGLYCERIDES: 92 mg/dL (ref 0.0–149.0)
VLDL: 18.4 mg/dL (ref 0.0–40.0)

## 2014-08-24 LAB — HEPATIC FUNCTION PANEL
ALBUMIN: 3.8 g/dL (ref 3.5–5.2)
ALT: 22 U/L (ref 0–35)
AST: 25 U/L (ref 0–37)
Alkaline Phosphatase: 66 U/L (ref 39–117)
Bilirubin, Direct: 0.1 mg/dL (ref 0.0–0.3)
TOTAL PROTEIN: 7.6 g/dL (ref 6.0–8.3)
Total Bilirubin: 0.8 mg/dL (ref 0.2–1.2)

## 2014-08-24 LAB — CBC
HEMATOCRIT: 44.2 % (ref 36.0–46.0)
Hemoglobin: 14.7 g/dL (ref 12.0–15.0)
MCHC: 33.3 g/dL (ref 30.0–36.0)
MCV: 97.5 fl (ref 78.0–100.0)
PLATELETS: 238 10*3/uL (ref 150.0–400.0)
RBC: 4.53 Mil/uL (ref 3.87–5.11)
RDW: 14.1 % (ref 11.5–15.5)
WBC: 8 10*3/uL (ref 4.0–10.5)

## 2014-08-24 LAB — TSH: TSH: 2.16 u[IU]/mL (ref 0.35–4.50)

## 2014-08-24 MED ORDER — PANTOPRAZOLE SODIUM 40 MG PO TBEC: 40.0000 mg | DELAYED_RELEASE_TABLET | Freq: Every day | ORAL | Status: DC

## 2014-08-24 MED ORDER — AMLODIPINE BESYLATE 10 MG PO TABS: 10.0000 mg | ORAL_TABLET | Freq: Every day | ORAL | Status: DC

## 2014-08-24 MED ORDER — ALPRAZOLAM 0.5 MG PO TABS: 0.5000 mg | ORAL_TABLET | Freq: Every evening | ORAL | Status: DC | PRN

## 2014-08-24 MED ORDER — TRAMADOL HCL 50 MG PO TABS: 50.0000 mg | ORAL_TABLET | Freq: Three times a day (TID) | ORAL | Status: DC | PRN

## 2014-08-24 NOTE — Assessment & Plan Note (Signed)
Stable, for med refill, f/u 6 mo

## 2014-08-24 NOTE — Addendum Note (Signed)
Addended by: Peggyann Shoals on: 08/24/2014 03:31 PM   Modules accepted: Orders

## 2014-08-24 NOTE — Assessment & Plan Note (Signed)

## 2014-08-24 NOTE — Patient Instructions (Signed)
Please take all new medication as prescribed - the protonix 40 mg per day  Please continue all other medications as before, and refills have been done if requested - the xanax  Please have the pharmacy call with any other refills you may need.  Please continue your efforts at being more active, low cholesterol diet, and weight control.  You are otherwise up to date with prevention measures today.  You will be contacted regarding the referral for: Dr Jenkins/NS  Please keep your appointments with your specialists as you may have planned  Please go to the LAB in the Basement (turn left off the elevator) for the tests to be done today  You will be contacted by phone if any changes need to be made immediately.  Otherwise, you will receive a letter about your results with an explanation, but please check with MyChart first.  Please remember to sign up for MyChart if you have not done so, as this will be important to you in the future with finding out test results, communicating by private email, and scheduling acute appointments online when needed.  Please return in 6 months, or sooner if needed

## 2014-08-24 NOTE — Addendum Note (Signed)
Addended by: Peggyann Shoals on: 08/24/2014 03:28 PM   Modules accepted: Orders

## 2014-08-24 NOTE — Assessment & Plan Note (Signed)
With known severe lumbar disc dz, for referral for second opinoin regarding proposed surgury per Dr Joya Salm with Dr Arnoldo Morale

## 2014-08-24 NOTE — Progress Notes (Signed)
Subjective:    Patient ID: Carolyn Wells, female    DOB: 08/10/1941, 73 y.o.   MRN: [id number]  HPI Here for wellness and f/u;  Overall doing ok;  Pt denies CP, worsening SOB, DOE, wheezing, orthopnea, PND, worsening LE edema, palpitations, dizziness or syncope.  Pt denies neurological change such as new headache, facial or extremity weakness.  Pt denies polydipsia, polyuria, or low sugar symptoms. Pt states overall good compliance with treatment and medications, good tolerability, and has been trying to follow lower cholesterol diet.  Pt denies worsening depressive symptoms, suicidal ideation or panic, though needs xanax refill. . No fever, night sweats, wt loss, loss of appetite, or other constitutional symptoms.  Pt states good ability with ADL's, has low to mod fall risk due to back and neck pain, home safety reviewed and adequate, no other significant changes in hearing or vision, and only occasionally active with exercise. Does have left neck pain with radiation to the LUE and also tingling in the hand occas.  Did see Dr Joya Salm for lower back pain, sent for myelogram with f/u, with severe lower lumbar disc dz, now sp ESI x 2 per Dr Brien Few also in that office. Injections seemed to help for 6 wks only each, plans to f/u.  Did have prior lumbar to [facility] with Dr Eugenia Pancoast, would like to go for second opinion regarding surgury proposed per Dr Joya Salm.  Alleve helps somewhat, but trying to minimize as only has one kidney.  Does have tramadol at home, but only uses for more severe pain.  Rides recumbant stat bike 3 miles per day.  Denies worsening depressive symptoms, suicidal ideation, or panic; has ongoing anxiety, not increased recently - needs xanax refill. Has put off f/u colonscopy with Dr Henrene Pastor f/u due to back pain ongoing.  Unfort still with occas heartburn near daily on the prevacid.  Past Medical History  Diagnosis Date  . HYPERLIPIDEMIA 09/27/2010  . OBESITY 08/04/2007  . HYPERTENSION  08/06/2007  . BRADYCARDIA 09/26/2010  . INTERNAL HEMORRHOIDS 08/04/2007  . GASTROESOPHAGEAL REFLUX DISEASE 08/04/2007  . ABDOMINAL INCISIONAL HERNIA 08/13/2009  . DIVERTICULOSIS, COLON 08/25/2007  . LOW BACK PAIN 08/06/2007  . BACK PAIN 04/09/2009  . ARM PAIN, LEFT 06/05/2010  . OSTEOPENIA 08/04/2007  . RASH-NONVESICULAR 08/30/2008  . WEIGHT LOSS 08/13/2009  . COLONIC POLYPS, HX OF 08/25/2007  . Solitary kidney, acquired 09/28/2011  . Allergic rhinitis, cause unspecified 09/28/2011   Past Surgical History  Procedure Laterality Date  . Nephrectomy      Right  . S/o incisional hernia repair      RUQ    reports that she has never smoked. She does not have any smokeless tobacco history on file. She reports that she does not drink alcohol or use illicit drugs. family history includes Coronary artery disease (age of onset: 24) in her other; Diabetes in her other. Allergies  Allergen Reactions  . Hydrocodone Nausea Only and Other (See Comments)    lethargy   Current Outpatient Prescriptions on File Prior to Visit  Medication Sig Dispense Refill  . aspirin 81 MG EC tablet Take 81 mg by mouth daily.        . bimatoprost (LUMIGAN) 0.03 % ophthalmic solution 1 drop at bedtime.      . Calcium Citrate (CITRACAL PO) Take by mouth daily.      [name] levothyroxine (SYNTHROID, LEVOTHROID) 25 MCG tablet Take 1 tablet (25 mcg total) by mouth daily.  90 tablet  3  .  Multiple Vitamin (MULTIVITAMIN) tablet Take 1 tablet by mouth daily.      . pimecrolimus (ELIDEL) 1 % cream Apply topically 2 (two) times daily.  30 g  0   No current facility-administered medications on file prior to visit.    Review of Systems Constitutional: Negative for increased diaphoresis, other activity, appetite or other siginficant weight change  HENT: Negative for worsening hearing loss, ear pain, facial swelling, mouth sores and neck stiffness.   Eyes: Negative for other worsening pain, redness or visual disturbance.  Respiratory:  Negative for shortness of breath and wheezing.   Cardiovascular: Negative for chest pain and palpitations.  Gastrointestinal: Negative for diarrhea, blood in stool, abdominal distention or other pain Genitourinary: Negative for hematuria, flank pain or change in urine volume.  Musculoskeletal: Negative for myalgias or other joint complaints.  Skin: Negative for color change and wound.  Neurological: Negative for syncope and numbness. other than noted Hematological: Negative for adenopathy. or other swelling Psychiatric/Behavioral: Negative for hallucinations, self-injury, decreased concentration or other worsening agitation.      Objective:   Physical Exam BP 140/80  Pulse 76  Temp(Src) 97.8 F (36.6 C)  Wt 156 lb (70.761 kg)  SpO2 98% VS noted,  Constitutional: Pt is oriented to person, place, and time. Appears well-developed and well-nourished.  Head: Normocephalic and atraumatic.  Right Ear: External ear normal.  Left Ear: External ear normal.  Nose: Nose normal.  Mouth/Throat: Oropharynx is clear and moist.  Eyes: Conjunctivae and EOM are normal. Pupils are equal, round, and reactive to light.  Neck: Normal range of motion. Neck supple. No JVD present. No tracheal deviation present.  Cardiovascular: Normal rate, regular rhythm, normal heart sounds and intact distal pulses.   Pulmonary/Chest: Effort normal and breath sounds without rales or wheezing  Abdominal: Soft. Bowel sounds are normal. NT. No HSM  Musculoskeletal: Normal range of motion. Exhibits no edema.  Lymphadenopathy:  Has no cervical adenopathy.  Neurological: Pt is alert and oriented to person, place, and time. Pt has normal reflexes. No cranial nerve deficit. Motor grossly intact except LLE  And LUE 4+ 5, DTR/gait intact Skin: Skin is warm and dry. No rash noted.  Psychiatric:  Has normal mood and affect. Behavior is normal.     Assessment & Plan:

## 2014-08-24 NOTE — Progress Notes (Signed)
Pre visit review using our clinic review tool, if applicable. No additional management support is needed unless otherwise documented below in the visit note. 

## 2014-08-24 NOTE — Assessment & Plan Note (Signed)
Also for Dr Arnoldo Morale referral,  to f/u any worsening symptoms or concerns

## 2014-08-24 NOTE — Assessment & Plan Note (Signed)
Uncontrolled, for change prevacid to protonix 40 qd,  to f/u any worsening symptoms or concerns

## 2014-08-28 ENCOUNTER — Encounter: Payer: Self-pay | Admitting: Internal Medicine

## 2014-08-28 ENCOUNTER — Other Ambulatory Visit: Payer: Self-pay | Admitting: Internal Medicine

## 2014-08-28 MED ORDER — ATORVASTATIN CALCIUM 10 MG PO TABS: 10.0000 mg | ORAL_TABLET | Freq: Every day | ORAL | Status: DC

## 2014-09-18 DIAGNOSIS — M5417 Radiculopathy, lumbosacral region: Secondary | ICD-10-CM | POA: Insufficient documentation

## 2014-09-24 ENCOUNTER — Other Ambulatory Visit: Payer: Self-pay | Admitting: Internal Medicine

## 2014-11-06 ENCOUNTER — Encounter: Payer: Self-pay | Admitting: Physician Assistant

## 2015-02-18 ENCOUNTER — Telehealth: Payer: Self-pay | Admitting: Internal Medicine

## 2015-02-18 DIAGNOSIS — E785 Hyperlipidemia, unspecified: Secondary | ICD-10-CM

## 2015-02-18 NOTE — Telephone Encounter (Signed)
Patient has an appointment on 04/19 and she wants come in early to get her blood work done. I didn't see any orders for her. Please advise patient.

## 2015-02-19 NOTE — Telephone Encounter (Signed)
Notified pt labs entered...Carolyn Wells

## 2015-02-19 NOTE — Telephone Encounter (Signed)
approp labs for this visit have been entered

## 2015-02-20 ENCOUNTER — Other Ambulatory Visit (INDEPENDENT_AMBULATORY_CARE_PROVIDER_SITE_OTHER): Payer: Medicare HMO

## 2015-02-20 DIAGNOSIS — E785 Hyperlipidemia, unspecified: Secondary | ICD-10-CM | POA: Diagnosis not present

## 2015-02-20 LAB — HEPATIC FUNCTION PANEL
ALK PHOS: 58 U/L (ref 39–117)
ALT: 11 U/L (ref 0–35)
AST: 11 U/L (ref 0–37)
Albumin: 3.7 g/dL (ref 3.5–5.2)
BILIRUBIN TOTAL: 0.6 mg/dL (ref 0.2–1.2)
Bilirubin, Direct: 0.1 mg/dL (ref 0.0–0.3)
Total Protein: 6.6 g/dL (ref 6.0–8.3)

## 2015-02-20 LAB — LIPID PANEL
Cholesterol: 179 mg/dL (ref 0–200)
HDL: 74.8 mg/dL (ref >39.00)
LDL Cholesterol: 84 mg/dL (ref 0–99)
NONHDL: 104.2
TRIGLYCERIDES: 101 mg/dL (ref 0.0–149.0)
Total CHOL/HDL Ratio: 2
VLDL: 20.2 mg/dL (ref 0.0–40.0)

## 2015-02-20 LAB — CBC WITH DIFFERENTIAL/PLATELET
BASOS ABS: 0 10*3/uL (ref 0.0–0.1)
Basophils Relative: 0.6 % (ref 0.0–3.0)
EOS ABS: 0.2 10*3/uL (ref 0.0–0.7)
Eosinophils Relative: 2.4 % (ref 0.0–5.0)
HEMATOCRIT: 41.4 % (ref 36.0–46.0)
HEMOGLOBIN: 14.1 g/dL (ref 12.0–15.0)
LYMPHS PCT: 20.5 % (ref 12.0–46.0)
Lymphs Abs: 1.4 10*3/uL (ref 0.7–4.0)
MCHC: 34.2 g/dL (ref 30.0–36.0)
MCV: 95.8 fl (ref 78.0–100.0)
Monocytes Absolute: 0.7 10*3/uL (ref 0.1–1.0)
Monocytes Relative: 10.6 % (ref 3.0–12.0)
NEUTROS ABS: 4.5 10*3/uL (ref 1.4–7.7)
Neutrophils Relative %: 65.9 % (ref 43.0–77.0)
Platelets: 236 10*3/uL (ref 150.0–400.0)
RBC: 4.32 Mil/uL (ref 3.87–5.11)
RDW: 14.2 % (ref 11.5–15.5)
WBC: 6.8 10*3/uL (ref 4.0–10.5)

## 2015-02-20 LAB — BASIC METABOLIC PANEL
BUN: 21 mg/dL (ref 6–23)
CALCIUM: 9.7 mg/dL (ref 8.4–10.5)
CO2: 32 mEq/L (ref 19–32)
Chloride: 104 mEq/L (ref 96–112)
Creatinine, Ser: 1.36 mg/dL — ABNORMAL HIGH (ref 0.40–1.20)
GFR: 40.32 mL/min — AB (ref >60.00)
GLUCOSE: 94 mg/dL (ref 70–99)
POTASSIUM: 5.1 meq/L (ref 3.5–5.1)
Sodium: 139 mEq/L (ref 135–145)

## 2015-02-26 ENCOUNTER — Ambulatory Visit (INDEPENDENT_AMBULATORY_CARE_PROVIDER_SITE_OTHER): Payer: Medicare HMO | Admitting: Internal Medicine

## 2015-02-26 ENCOUNTER — Encounter: Payer: Self-pay | Admitting: Internal Medicine

## 2015-02-26 VITALS — BP 124/80 | HR 66 | Temp 98.0°F | Resp 18 | Ht 67.0 in | Wt 144.1 lb

## 2015-02-26 DIAGNOSIS — Z0189 Encounter for other specified special examinations: Secondary | ICD-10-CM | POA: Diagnosis not present

## 2015-02-26 DIAGNOSIS — I10 Essential (primary) hypertension: Secondary | ICD-10-CM | POA: Diagnosis not present

## 2015-02-26 DIAGNOSIS — E785 Hyperlipidemia, unspecified: Secondary | ICD-10-CM | POA: Diagnosis not present

## 2015-02-26 DIAGNOSIS — N183 Chronic kidney disease, stage 3 unspecified: Secondary | ICD-10-CM

## 2015-02-26 DIAGNOSIS — N184 Chronic kidney disease, stage 4 (severe): Secondary | ICD-10-CM | POA: Insufficient documentation

## 2015-02-26 DIAGNOSIS — Z0000 Encounter for general adult medical examination without abnormal findings: Secondary | ICD-10-CM

## 2015-02-26 HISTORY — DX: Chronic kidney disease, stage 3 unspecified: N18.30

## 2015-02-26 NOTE — Patient Instructions (Signed)
Please continue all other medications as before, and refills have been done if requested.  Please have the pharmacy call with any other refills you may need.  Please continue your efforts at being more active, low cholesterol diet, and weight control.  You are otherwise up to date with prevention measures today.  Please keep your appointments with your specialists as you may have planned  Please return in 6 months, or sooner if needed, with Lab testing done 3-5 days before  

## 2015-02-26 NOTE — Assessment & Plan Note (Signed)
stable overall by history and exam, recent data reviewed with pt, and pt to continue medical treatment as before,  to f/u any worsening symptoms or concerns Lab Results  Component Value Date   CREATININE 1.36* 02/20/2015

## 2015-02-26 NOTE — Assessment & Plan Note (Signed)
stable overall by history and exam, recent data reviewed with pt, and pt to continue medical treatment as before,  to f/u any worsening symptoms or concerns BP Readings from Last 3 Encounters:  02/26/15 124/80  08/24/14 140/80  04/18/14 158/74

## 2015-02-26 NOTE — Progress Notes (Signed)
Subjective:    Patient ID: Carolyn Wells, female    DOB: Feb 04, 1941, 74 y.o.   MRN: [id number]  HPI    Here to f/u; overall doing ok,  Pt denies chest pain, increasing sob or doe, wheezing, orthopnea, PND, increased LE swelling, palpitations, dizziness or syncope.  Pt denies new neurological symptoms such as new headache, or facial or extremity weakness or numbness.  Pt denies polydipsia, polyuria, or low sugar episode.   Pt denies new neurological symptoms such as new headache, or facial or extremity weakness or numbness.   Pt states overall good compliance with meds, mostly trying to follow appropriate diet, with wt overall stable,  but little exercise however. Not taking lipitor due to knee swelling, though might be related.  Taking otc red yeast ric and cholestoff from [employer], but also has cut out higher chol foods such as red meat and eggs. Eating more fruits and veg, some mild with cereal. Lost 12 lbs   Wt Readings from Last 3 Encounters:  02/26/15 144 lb 1.3 oz (65.354 kg)  08/24/14 156 lb (70.761 kg)  03/07/14 156 lb 8 oz (70.988 kg)  left lower back pain imoved with turmeric and PT from dec through feb 2016 per second opinion per Dr Arnoldo Morale.  Past Medical History  Diagnosis Date  . HYPERLIPIDEMIA 09/27/2010  . OBESITY 08/04/2007  . HYPERTENSION 08/06/2007  . BRADYCARDIA 09/26/2010  . INTERNAL HEMORRHOIDS 08/04/2007  . GASTROESOPHAGEAL REFLUX DISEASE 08/04/2007  . ABDOMINAL INCISIONAL HERNIA 08/13/2009  . DIVERTICULOSIS, COLON 08/25/2007  . LOW BACK PAIN 08/06/2007  . BACK PAIN 04/09/2009  . ARM PAIN, LEFT 06/05/2010  . OSTEOPENIA 08/04/2007  . RASH-NONVESICULAR 08/30/2008  . WEIGHT LOSS 08/13/2009  . COLONIC POLYPS, HX OF 08/25/2007  . Solitary kidney, acquired 09/28/2011  . Allergic rhinitis, cause unspecified 09/28/2011   Past Surgical History  Procedure Laterality Date  . Nephrectomy      Right  . S/o incisional hernia repair      RUQ    reports that she has never smoked.  She does not have any smokeless tobacco history on file. She reports that she does not drink alcohol or use illicit drugs. family history includes Coronary artery disease (age of onset: 46) in her other; Diabetes in her other. Allergies  Allergen Reactions  . Hydrocodone Nausea Only and Other (See Comments)    lethargy   Current Outpatient Prescriptions on File Prior to Visit  Medication Sig Dispense Refill  . amLODipine (NORVASC) 10 MG tablet Take 1 tablet (10 mg total) by mouth daily. 90 tablet 3  . aspirin 81 MG EC tablet Take 81 mg by mouth daily.      . Calcium Citrate (CITRACAL PO) Take by mouth daily.    . diclofenac sodium (VOLTAREN) 1 % GEL Apply topically 4 (four) times daily.    [name] ibuprofen (ADVIL,MOTRIN) 800 MG tablet     . levothyroxine (SYNTHROID, LEVOTHROID) 25 MCG tablet TAKE ONE TABLET BY MOUTH ONCE DAILY 90 tablet 3  . pantoprazole (PROTONIX) 40 MG tablet Take 1 tablet (40 mg total) by mouth daily. 90 tablet 3  . ALPRAZolam (XANAX) 0.5 MG tablet Take 1 tablet (0.5 mg total) by mouth at bedtime as needed for sleep. (Patient not taking: Reported on 02/26/2015) 30 tablet 5  . atorvastatin (LIPITOR) 10 MG tablet Take 1 tablet (10 mg total) by mouth daily. (Patient not taking: Reported on 02/26/2015) 90 tablet 3  . bimatoprost (LUMIGAN) 0.03 % ophthalmic solution 1 drop  at bedtime.    . Multiple Vitamin (MULTIVITAMIN) tablet Take 1 tablet by mouth daily.    . pimecrolimus (ELIDEL) 1 % cream Apply topically 2 (two) times daily. (Patient not taking: Reported on 02/26/2015) 30 g 0  . traMADol (ULTRAM) 50 MG tablet Take 1 tablet (50 mg total) by mouth every 8 (eight) hours as needed. (Patient not taking: Reported on 02/26/2015) 60 tablet 1   No current facility-administered medications on file prior to visit.   Review of Systems  Constitutional: Negative for unusual diaphoresis or night sweats HENT: Negative for ringing in ear or discharge Eyes: Negative for double vision or  worsening visual disturbance.  Respiratory: Negative for choking and stridor.   Gastrointestinal: Negative for vomiting or other signifcant bowel change Genitourinary: Negative for hematuria or change in urine volume.  Musculoskeletal: Negative for other MSK pain or swelling Skin: Negative for color change and worsening wound.  Neurological: Negative for tremors and numbness other than noted  Psychiatric/Behavioral: Negative for decreased concentration or agitation other than above       Objective:   Physical Exam BP 124/80 mmHg  Pulse 66  Temp(Src) 98 F (36.7 C) (Oral)  Resp 18  Ht 5\' 7"  (1.702 m)  Wt 144 lb 1.3 oz (65.354 kg)  BMI 22.56 kg/m2  SpO2 98% VS noted,  Constitutional: Pt appears in no significant distress HENT: Head: NCAT.  Right Ear: External ear normal.  Left Ear: External ear normal.  Eyes: . Pupils are equal, round, and reactive to light. Conjunctivae and EOM are normal Neck: Normal range of motion. Neck supple.  Cardiovascular: Normal rate and regular rhythm.   Pulmonary/Chest: Effort normal and breath sounds without rales or wheezing.  Abd:  Soft, NT, ND, + BS Neurological: Pt is alert. Not confused , motor grossly intact Skin: Skin is warm. No rash, no LE edema Psychiatric: Pt behavior is normal. No agitation.        Assessment & Plan:

## 2015-02-26 NOTE — Progress Notes (Signed)
Pre visit review using our clinic review tool, if applicable. No additional management support is needed unless otherwise documented below in the visit note. 

## 2015-02-26 NOTE — Assessment & Plan Note (Signed)
Much improved, to cont diet, declines statin

## 2015-03-08 LAB — HM MAMMOGRAPHY: HM MAMMO: NEGATIVE

## 2015-04-26 ENCOUNTER — Telehealth: Payer: Self-pay

## 2015-04-26 NOTE — Telephone Encounter (Signed)
Patient called to educate on Medicare Wellness apt. LVM for the patient to call back to educate and schedule for wellness visit.   

## 2015-04-30 ENCOUNTER — Telehealth: Payer: Self-pay

## 2015-04-30 NOTE — Telephone Encounter (Signed)
Call to fup on AWV. Left another voice mail for call back.

## 2015-04-30 NOTE — Telephone Encounter (Signed)
The patient fup regarding AWV and scheduled apt for Monday the 27th at 1pm.

## 2015-05-06 ENCOUNTER — Telehealth: Payer: Self-pay

## 2015-05-06 ENCOUNTER — Ambulatory Visit (INDEPENDENT_AMBULATORY_CARE_PROVIDER_SITE_OTHER): Payer: Medicare HMO

## 2015-05-06 VITALS — BP 124/86 | HR 60 | Temp 98.2°F | Ht 66.0 in | Wt 141.5 lb

## 2015-05-06 DIAGNOSIS — Z0000 Encounter for general adult medical examination without abnormal findings: Secondary | ICD-10-CM

## 2015-05-06 NOTE — Telephone Encounter (Signed)
Call to [facility] to request mammogram report and agreed to fax over

## 2015-05-06 NOTE — Patient Instructions (Addendum)
Carolyn Wells , Thank you for taking time to come for your Medicare Wellness Visit. I appreciate your ongoing commitment to your health goals. Please review the following plan we discussed and let me know if I can assist you in the future.  Stay active   These are the goals we discussed: Goals    . Exercise 3x per week (30 min per time)     Would like to start back exercises; will try pilates        This is a list of the screening recommended for you and due dates:  Health Maintenance  Topic Date Due  . Colon Cancer Screening  07/04/2010  . Mammogram  03/04/2015  . Flu Shot  06/10/2015  . Tetanus Vaccine  08/14/2019  . DEXA scan (bone density measurement)  Completed  . Shingles Vaccine  Completed  . Pneumonia vaccines  Completed   Fat free or low fat dairy products Fish high in omega-3 acids ( salmon, tuna, trout) Fruits, such as apples, bananas, oranges, pears, prunes Legumes, such as kidney beans, lentils, checkpeas, black-eyed peas and lima beans Vegetables; broccoli, cabbage, carrots Whole grains;   Plant fats are better; decrease "white" foods as pasta, rice, bread and desserts, sugar; Avoid red meat (limiting) palm and coconut oils; sugary foods and beverages  Two nutrients that raise blood chol levels are saturated fats and trans fat; in hydrogenated oils and fats, as stick margarine, baked goods (cookes, cakes, pies, crackers; frosting; and coffee creamers;   Some Fats lower cholesterol: Monounsaturated and polyunsaturated  Avocados Corn, sunflower, and soybean oils Nuts and seeds, such as walnuts Olive, canola, peanut, safflower, and sesame oils Peanut butter Salmon and trout Tofu  The BMI (body mass index) is calculated from your height and weight. BMI is an estimate of body fat and a good gauge of your risk for diseases that can occur with more body fat. The higher your BMI, the more risk you have for heart disease, high blood pressu   Why follow it? Research  shows. . Those who follow the Mediterranean diet have a reduced risk of heart disease  . The diet is associated with a reduced incidence of Parkinson's and Alzheimer's diseases . People following the diet may have longer life expectancies and lower rates of chronic diseases  . The Dietary Guidelines for Americans recommends the Mediterranean diet as an eating plan to promote health and prevent disease  What Is the Mediterranean Diet?  . Healthy eating plan based on typical foods and recipes of Mediterranean-style cooking . The diet is primarily a plant based diet; these foods should make up a majority of meals   Starches - Plant based foods should make up a majority of meals - They are an important sources of vitamins, minerals, energy, antioxidants, and fiber - Choose whole grains, foods high in fiber and minimally processed items  - Typical grain sources include wheat, oats, barley, corn, brown rice, bulgar, farro, millet, polenta, couscous  - Various types of beans include chickpeas, lentils, fava beans, black beans, white beans   Fruits  Veggies - Large quantities of antioxidant rich fruits & veggies; 6 or more servings  - Vegetables can be eaten raw or lightly drizzled with oil and cooked  - Vegetables common to the traditional Mediterranean Diet include: artichokes, arugula, beets, broccoli, brussel sprouts, cabbage, carrots, celery, collard greens, cucumbers, eggplant, kale, leeks, lemons, lettuce, mushrooms, okra, onions, peas, peppers, potatoes, pumpkin, radishes, rutabaga, shallots, spinach, sweet potatoes, turnips, zucchini -  Fruits common to the Mediterranean Diet include: apples, apricots, avocados, cherries, clementines, dates, figs, grapefruits, grapes, melons, nectarines, oranges, peaches, pears, pomegranates, strawberries, tangerines  Fats - Replace butter and margarine with healthy oils, such as olive oil, canola oil, and tahini  - Limit nuts to no more than a handful a day  -  Nuts include walnuts, almonds, pecans, pistachios, pine nuts  - Limit or avoid candied, honey roasted or heavily salted nuts - Olives are central to the [employer] - can be eaten whole or used in a variety of dishes   Meats Protein - Limiting red meat: no more than a few times a month - When eating red meat: choose lean cuts and keep the portion to the size of deck of cards - Eggs: approx. 0 to 4 times a week  - Fish and lean poultry: at least 2 a week  - Healthy protein sources include, chicken, [place], lean beef, lamb - Increase intake of seafood such as tuna, salmon, trout, mackerel, shrimp, scallops - Avoid or limit high fat processed meats such as sausage and bacon  Dairy - Include moderate amounts of low fat dairy products  - Focus on healthy dairy such as fat free yogurt, skim milk, low or reduced fat cheese - Limit dairy products higher in fat such as whole or 2% milk, cheese, ice cream  Alcohol - Moderate amounts of red wine is ok  - No more than 5 oz daily for women (all ages) and men older than age 59  - No more than 10 oz of wine daily for men younger than 20  Other - Limit sweets and other desserts  - Use herbs and spices instead of salt to flavor foods  - Herbs and spices common to the traditional Mediterranean Diet include: basil, bay leaves, chives, cloves, cumin, fennel, garlic, lavender, marjoram, mint, oregano, parsley, pepper, rosemary, sage, savory, sumac, tarragon, thyme   It's not just a diet, it's a lifestyle:  . The Mediterranean diet includes lifestyle factors typical of those in the region  . Foods, drinks and meals are best eaten with others and savored . Daily physical activity is important for overall good health . This could be strenuous exercise like running and aerobics . This could also be more leisurely activities such as walking, housework, yard-work, or taking the stairs . Moderation is the key; a balanced and healthy diet accommodates most foods  and drinks . Consider portion sizes and frequency of consumption of certain foods   Meal Ideas & Options:  . Breakfast:  o Whole wheat toast or whole wheat English muffins with peanut butter & hard boiled egg o Steel cut oats topped with apples & cinnamon and skim milk  o Fresh fruit: banana, strawberries, melon, berries, peaches  o Smoothies: strawberries, bananas, greek yogurt, peanut butter o Low fat greek yogurt with blueberries and granola  o Egg white omelet with spinach and mushrooms o Breakfast couscous: whole wheat couscous, apricots, skim milk, cranberries  . Sandwiches:  o Hummus and grilled vegetables (peppers, zucchini, squash) on whole wheat bread   o Grilled chicken on whole wheat pita with lettuce, tomatoes, cucumbers or tzatziki  o Tuna salad on whole wheat bread: tuna salad made with greek yogurt, olives, red peppers, capers, green onions o Garlic rosemary lamb pita: lamb sauted with garlic, rosemary, salt & pepper; add lettuce, cucumber, greek yogurt to pita - flavor with lemon juice and black pepper  . Seafood:  o Mediterranean grilled  salmon, seasoned with garlic, basil, parsley, lemon juice and black pepper o Shrimp, lemon, and spinach whole-grain pasta salad made with low fat greek yogurt  o Seared scallops with lemon orzo  o Seared tuna steaks seasoned salt, pepper, coriander topped with tomato mixture of olives, tomatoes, olive oil, minced garlic, parsley, green onions and cappers  . Meats:  o Herbed greek chicken salad with kalamata olives, cucumber, feta  o Red bell peppers stuffed with spinach, bulgur, lean ground beef (or lentils) & topped with feta   o Kebabs: skewers of chicken, tomatoes, onions, zucchini, squash  o [place] burgers: made with red onions, mint, dill, lemon juice, feta cheese topped with roasted red peppers . Vegetarian o Cucumber salad: cucumbers, artichoke hearts, celery, red onion, feta cheese, tossed in olive oil & lemon juice  o Hummus  and whole grain pita points with a greek salad (lettuce, tomato, feta, olives, cucumbers, red onion) o Lentil soup with celery, carrots made with vegetable broth, garlic, salt and pepper  o Tabouli salad: parsley, bulgur, mint, scallions, cucumbers, tomato, radishes, lemon juice, olive oil, salt and pepper.     A high BMI in conjunction with other risk put you at greater risk for heart disease and other conditions Risk include High Blood pressure High LDL  (bad cholesterol) Low HDL (good cholesterol) High Triglycerides High Blood Glucose Family hx of premature heart disease Physical Inactivity    Even a small weight loss will help lower your risk of developing disease associated with obesity.  Your goal today is based on decreasing one of these risk.   Health Maintenance Adopting a healthy lifestyle and getting preventive care can go a long way to promote health and wellness. Talk with your health care provider about what schedule of regular examinations is right for you. This is a good chance for you to check in with your provider about disease prevention and staying healthy. In between checkups, there are plenty of things you can do on your own. Experts have done a lot of research about which lifestyle changes and preventive measures are most likely to keep you healthy. Ask your health care provider for more information. WEIGHT AND DIET  Eat a healthy diet  Be sure to include plenty of vegetables, fruits, low-fat dairy products, and lean protein.  Do not eat a lot of foods high in solid fats, added sugars, or salt.  Get regular exercise. This is one of the most important things you can do for your health.  Most adults should exercise for at least 150 minutes each week. The exercise should increase your heart rate and make you sweat (moderate-intensity exercise).  Most adults should also do strengthening exercises at least twice a week. This is in addition to the moderate-intensity  exercise.  Maintain a healthy weight  Body mass index (BMI) is a measurement that can be used to identify possible weight problems. It estimates body fat based on height and weight. Your health care provider can help determine your BMI and help you achieve or maintain a healthy weight.  For females 17 years of age and older:   A BMI below 18.5 is considered underweight.  A BMI of 18.5 to 24.9 is normal.  A BMI of 25 to 29.9 is considered overweight.  A BMI of 30 and above is considered obese.  Watch levels of cholesterol and blood lipids  You should start having your blood tested for lipids and cholesterol at 75 years of age, then have this  test every 5 years.  You may need to have your cholesterol levels checked more often if:  Your lipid or cholesterol levels are high.  You are older than 75 years of age.  You are at high risk for heart disease.  CANCER SCREENING   Lung Cancer  Lung cancer screening is recommended for adults 76-46 years old who are at high risk for lung cancer because of a history of smoking.  A yearly low-dose CT scan of the lungs is recommended for people who:  Currently smoke.  Have quit within the past 15 years.  Have at least a 30-pack-year history of smoking. A pack year is smoking an average of one pack of cigarettes a day for 1 year.  Yearly screening should continue until it has been 15 years since you quit.  Yearly screening should stop if you develop a health problem that would prevent you from having lung cancer treatment.  Breast Cancer  Practice breast self-awareness. This means understanding how your breasts normally appear and feel.  It also means doing regular breast self-exams. Let your health care provider know about any changes, no matter how small.  If you are in your 20s or 30s, you should have a clinical breast exam (CBE) by a health care provider every 1-3 years as part of a regular health exam.  If you are 54 or  older, have a CBE every year. Also consider having a breast X-ray (mammogram) every year.  If you have a family history of breast cancer, talk to your health care provider about genetic screening.  If you are at high risk for breast cancer, talk to your health care provider about having an MRI and a mammogram every year.  Breast cancer gene (BRCA) assessment is recommended for women who have family members with BRCA-related cancers. BRCA-related cancers include:  Breast.  Ovarian.  Tubal.  Peritoneal cancers.  Results of the assessment will determine the need for genetic counseling and BRCA1 and BRCA2 testing. Cervical Cancer Routine pelvic examinations to screen for cervical cancer are no longer recommended for nonpregnant women who are considered low risk for cancer of the pelvic organs (ovaries, uterus, and vagina) and who do not have symptoms. A pelvic examination may be necessary if you have symptoms including those associated with pelvic infections. Ask your health care provider if a screening pelvic exam is right for you.   The Pap test is the screening test for cervical cancer for women who are considered at risk.  If you had a hysterectomy for a problem that was not cancer or a condition that could lead to cancer, then you no longer need Pap tests.  If you are older than 65 years, and you have had normal Pap tests for the past 10 years, you no longer need to have Pap tests.  If you have had past treatment for cervical cancer or a condition that could lead to cancer, you need Pap tests and screening for cancer for at least 20 years after your treatment.  If you no longer get a Pap test, assess your risk factors if they change (such as having a new sexual partner). This can affect whether you should start being screened again.  Some women have medical problems that increase their chance of getting cervical cancer. If this is the case for you, your health care provider may  recommend more frequent screening and Pap tests.  The human papillomavirus (HPV) test is another test that may be used for  cervical cancer screening. The HPV test looks for the virus that can cause cell changes in the cervix. The cells collected during the Pap test can be tested for HPV.  The HPV test can be used to screen women 68 years of age and older. Getting tested for HPV can extend the interval between normal Pap tests from three to five years.  An HPV test also should be used to screen women of any age who have unclear Pap test results.  After 75 years of age, women should have HPV testing as often as Pap tests.  Colorectal Cancer  This type of cancer can be detected and often prevented.  Routine colorectal cancer screening usually begins at 75 years of age and continues through 75 years of age.  Your health care provider may recommend screening at an earlier age if you have risk factors for colon cancer.  Your health care provider may also recommend using home test kits to check for hidden blood in the stool.  A small camera at the end of a tube can be used to examine your colon directly (sigmoidoscopy or colonoscopy). This is done to check for the earliest forms of colorectal cancer.  Routine screening usually begins at age 28.  Direct examination of the colon should be repeated every 5-10 years through 75 years of age. However, you may need to be screened more often if early forms of precancerous polyps or small growths are found. Skin Cancer  Check your skin from head to toe regularly.  Tell your health care provider about any new moles or changes in moles, especially if there is a change in a mole's shape or color.  Also tell your health care provider if you have a mole that is larger than the size of a pencil eraser.  Always use sunscreen. Apply sunscreen liberally and repeatedly throughout the day.  Protect yourself by wearing long sleeves, pants, a wide-brimmed hat,  and sunglasses whenever you are outside. HEART DISEASE, DIABETES, AND HIGH BLOOD PRESSURE   Have your blood pressure checked at least every 1-2 years. High blood pressure causes heart disease and increases the risk of stroke.  If you are between 31 years and 71 years old, ask your health care provider if you should take aspirin to prevent strokes.  Have regular diabetes screenings. This involves taking a blood sample to check your fasting blood sugar level.  If you are at a normal weight and have a low risk for diabetes, have this test once every three years after 75 years of age.  If you are overweight and have a high risk for diabetes, consider being tested at a younger age or more often. PREVENTING INFECTION  Hepatitis B  If you have a higher risk for hepatitis B, you should be screened for this virus. You are considered at high risk for hepatitis B if:  You were born in a country where hepatitis B is common. Ask your health care provider which countries are considered high risk.  Your parents were born in a high-risk country, and you have not been immunized against hepatitis B (hepatitis B vaccine).  You have HIV or AIDS.  You use needles to inject street drugs.  You live with someone who has hepatitis B.  You have had sex with someone who has hepatitis B.  You get hemodialysis treatment.  You take certain medicines for conditions, including cancer, organ transplantation, and autoimmune conditions. Hepatitis C  Blood testing is recommended for:  Everyone  born from [birth year] through 1966.  Anyone with known risk factors for hepatitis C. Sexually transmitted infections (STIs)  You should be screened for sexually transmitted infections (STIs) including gonorrhea and chlamydia if:  You are sexually active and are younger than 74 years of age.  You are older than 75 years of age and your health care provider tells you that you are at risk for this type of infection.  Your  sexual activity has changed since you were last screened and you are at an increased risk for chlamydia or gonorrhea. Ask your health care provider if you are at risk.  If you do not have HIV, but are at risk, it may be recommended that you take a prescription medicine daily to prevent HIV infection. This is called pre-exposure prophylaxis (PrEP). You are considered at risk if:  You are sexually active and do not regularly use condoms or know the HIV status of your partner(s).  You take drugs by injection.  You are sexually active with a partner who has HIV. Talk with your health care provider about whether you are at high risk of being infected with HIV. If you choose to begin PrEP, you should first be tested for HIV. You should then be tested every 3 months for as long as you are taking PrEP.  PREGNANCY   If you are premenopausal and you may become pregnant, ask your health care provider about preconception counseling.  If you may become pregnant, take 400 to 800 micrograms (mcg) of folic acid every day.  If you want to prevent pregnancy, talk to your health care provider about birth control (contraception). OSTEOPOROSIS AND MENOPAUSE   Osteoporosis is a disease in which the bones lose minerals and strength with aging. This can result in serious bone fractures. Your risk for osteoporosis can be identified using a bone density scan.  If you are 60 years of age or older, or if you are at risk for osteoporosis and fractures, ask your health care provider if you should be screened.  Ask your health care provider whether you should take a calcium or vitamin D supplement to lower your risk for osteoporosis.  Menopause may have certain physical symptoms and risks.  Hormone replacement therapy may reduce some of these symptoms and risks. Talk to your health care provider about whether hormone replacement therapy is right for you.  HOME CARE INSTRUCTIONS   Schedule regular health, dental, and  eye exams.  Stay current with your immunizations.   Do not use any tobacco products including cigarettes, chewing tobacco, or electronic cigarettes.  If you are pregnant, do not drink alcohol.  If you are breastfeeding, limit how much and how often you drink alcohol.  Limit alcohol intake to no more than 1 drink per day for nonpregnant women. One drink equals 12 ounces of beer, 5 ounces of wine, or 1 ounces of hard liquor.  Do not use street drugs.  Do not share needles.  Ask your health care provider for help if you need support or information about quitting drugs.  Tell your health care provider if you often feel depressed.  Tell your health care provider if you have ever been abused or do not feel safe at home. Document Released: 05/11/2011 Document Revised: 03/12/2014 Document Reviewed: 09/27/2013 [facility] Patient Information 2015 [place], [place]. This information is not intended to replace advice given to you by your health care provider. Make sure you discuss any questions you have with your health care provider.

## 2015-05-06 NOTE — Progress Notes (Addendum)
Subjective:   Carolyn Wells is a 74 y.o. female who presents for Medicare Annual (Subsequent) preventive examination.  Review of Systems:  HRA assessment completed during visit;  Patient is here for Annual Wellness Assessment The Patient was informed that this wellness visit is to identify risk and educate on how to reduce risk for increase disease through lifestyle changes.  The patient verbalized understanding that any voiced medical issues still need to be directed to their physician at a follow up visit as this is not a "physical exam" in which medical issues are addressed and treated. The goal of the wellness visit is to assist the patient how to close the gaps in care and create a preventative care plan for the patient.    Problem list review for risk htn, bradycardia, osteopenia; hyperlipidemia; obesity; anxiety  Talks about hx of back problems; 96 back operation;  Recently took turmeric pill and drops and has helped her pain  Health is good; happy with life  Anti -glaucoma meds; to assess for vision issue and risk for independent living or falls Osteopenic; recommendation 2000 vit d; weight bearing; recommended in 5 years.  Blood wore to be drawn in April  Lipids : chol 247; trig 92; LDL 146; HDL 82; / ratio 3  BMI: normal Diet; verity; fish, chicken; doesn't eat beef;  Fruits, salads, vegetables; Protein shake in am; cereal with blueberries; omelet Lunch; out to eat; sandwich; pimento cheese; [place];  [place] to eat;   Also uses oil drops therapeutically for heartburn; (oregano)  Uses ginger;   Reviewed heart friendly diet Fat free or low fat dairy products Fish high in omega-3 acids ( salmon, tuna, trout) Fruits, such as apples, bananas, oranges, pears, prunes Legumes, such as kidney beans, lentils, checkpeas, black-eyed peas and lima beans Vegetables; broccoli, cabbage, carrots Whole grains;   Plant fats are better; decrease "white" foods as pasta, rice,  bread and desserts, sugar; Avoid red meat (limiting) palm and coconut oils; sugary foods and beverages  Two nutrients that raise blood chol levels are saturated fats and trans fat; in hydrogenated oils and fats, as stick margarine, baked goods (cookes, cakes, pies, crackers; frosting; and coffee creamers;   Some Fats lower cholesterol: Monounsaturated and polyunsaturated  Avocados Corn, sunflower, and soybean oils Nuts and seeds, such as walnuts Olive, canola, peanut, safflower, and sesame oils Peanut butter Salmon and trout Tofu    Exercise; Rides her bike 3 miles per day; works in the garden; keep a working routine Was in PT group and learned exercises; Has a yoga mat and does stretches;  Stops when she starts to hurt in back; States later in the evening back gets to hurting more Goal to try Pilates for core strengthening;   Family Hx mother had DM after 80yo; Father had MI at 76; Gave spouse kidney and then had hernia;  Social history: lives alone; spouse died x 20 yo;  4 cats; Kids and grand- children;  Small support system; friends have d/a  Son has the business; Goes to visit office States I am not lonely;   Personalized education given regarding risk  Psychosocial support; safe community; firearm safety; smoke alarms;  lives in one level / spouse was w/c dependent so home accessible.  Discussed Goal to improve health based on risk  Screenings overdue Ophthalmology exam; regularly 6 months; ? onset of glaucoma; had glasses changed;   Cardiac Risk Factors include: advanced age (>36men, >35 women);dyslipidemia     Objective:  Vitals: BP 124/86 mmHg  Pulse 60  Temp(Src) 98.2 F (36.8 C)  Ht 5\' 6"  (1.676 m)  Wt 141 lb 8 oz (64.184 kg)  BMI 22.85 kg/m2  SpO2 97%  Tobacco History  Smoking status  . Never Smoker   Smokeless tobacco  . Not on file     Counseling given: Not Answered   Past Medical History  Diagnosis Date  . HYPERLIPIDEMIA 09/27/2010  .  OBESITY 08/04/2007  . HYPERTENSION 08/06/2007  . BRADYCARDIA 09/26/2010  . INTERNAL HEMORRHOIDS 08/04/2007  . GASTROESOPHAGEAL REFLUX DISEASE 08/04/2007  . ABDOMINAL INCISIONAL HERNIA 08/13/2009  . DIVERTICULOSIS, COLON 08/25/2007  . LOW BACK PAIN 08/06/2007  . BACK PAIN 04/09/2009  . ARM PAIN, LEFT 06/05/2010  . OSTEOPENIA 08/04/2007  . RASH-NONVESICULAR 08/30/2008  . WEIGHT LOSS 08/13/2009  . COLONIC POLYPS, HX OF 08/25/2007  . Solitary kidney, acquired 09/28/2011  . Allergic rhinitis, cause unspecified 09/28/2011  . CKD (chronic kidney disease), stage III 02/26/2015    Solitary kidney   Past Surgical History  Procedure Laterality Date  . Nephrectomy      Right  . S/o incisional hernia repair      RUQ   Family History  Problem Relation Age of Onset  . Coronary artery disease Other 60    1st degree relative   . Diabetes Other     1st degree relative  . Diabetes Mother   . Heart disease Father    History  Sexual Activity  . Sexual Activity: Not on file    Outpatient Encounter Prescriptions as of 05/06/2015  Medication Sig  . amLODipine (NORVASC) 10 MG tablet Take 1 tablet (10 mg total) by mouth daily.  . Ascorbic Acid (VITAMIN C) 1000 MG tablet Take 1,000 mg by mouth daily.  [name] aspirin 81 MG EC tablet Take 81 mg by mouth daily.    . brimonidine-timolol (COMBIGAN) 0.2-0.5 % ophthalmic solution Place 1 drop into both eyes every 12 (twelve) hours.  . Calcium Citrate (CITRACAL PO) Take by mouth daily.  . diclofenac sodium (VOLTAREN) 1 % GEL Apply topically 4 (four) times daily.  . Garlic (GARLIQUE PO) Take by mouth.  . levothyroxine (SYNTHROID, LEVOTHROID) 25 MCG tablet TAKE ONE TABLET BY MOUTH ONCE DAILY  . Multiple Vitamin (MULTIVITAMIN) tablet Take 1 tablet by mouth daily.  . Omega-3 Krill Oil 300 MG CAPS Take 1 capsule by mouth.  . pantoprazole (PROTONIX) 40 MG tablet Take 1 tablet (40 mg total) by mouth daily.  . pimecrolimus (ELIDEL) 1 % cream Apply topically 2 (two) times  daily.  . TURMERIC PO Take by mouth.  . ALPRAZolam (XANAX) 0.5 MG tablet Take 1 tablet (0.5 mg total) by mouth at bedtime as needed for sleep. (Patient not taking: Reported on 05/06/2015)  . [DISCONTINUED] bimatoprost (LUMIGAN) 0.03 % ophthalmic solution 1 drop at bedtime.  . [DISCONTINUED] traMADol (ULTRAM) 50 MG tablet Take 1 tablet (50 mg total) by mouth every 8 (eight) hours as needed. (Patient not taking: Reported on 02/26/2015)   No facility-administered encounter medications on file as of 05/06/2015.    Activities of Daily Living In your present state of health, do you have any difficulty performing the following activities: 05/06/2015  Hearing? N  Vision? N  Difficulty concentrating or making decisions? N  Dressing or bathing? N  Doing errands, shopping? N  Preparing Food and eating ? N  Using the Toilet? N  In the past six months, have you accidently leaked urine? N  Do you have problems  with loss of bowel control? N  Managing your Medications? N  Managing your Finances? N  Housekeeping or managing your Housekeeping? N    Patient Care Team: Biagio Borg, MD as PCP - General    Assessment:    Objective:   Personalized Education was given regarding:   Pt determined a personalized goal; see patient goals;  Assessment included: Calcium and Vit D as appropriate/ Osteoporosis risk reviewed Taking meds without issues; no barriers identified Labs were and fup visit noted with MD if labs are due to be re-drawn. Stress: Recommendations for managing stress if assessed as a factor;  No Risk for hepatitis or high risk social behavior identified via hepatitis screen Educated on shingles and follow up with insurance company for co-pays or charges applied to Part D benefit. Educated on Vaccines;  Safety issues reviewed; wearing shoes with front and back in them; tennis shoes when working Cognition assessed by AD8; Score 0 MMSE deferred as the patient stated they had no memory  issues; No identified risk were noted; The patient was oriented x 3; appropriate in dress and manner and no objective failures at ADL's or IADL's.   Immunizations;  Shingles; completed Colonoscopy; august of 2008 EKG 11/20'2014 Dexa scan; Due 2018 due/ citracal with Vit d in am Mammogram; just had one last month; [facility]  Hearing: 4000hz  Dental: up to date  Gave information on safety to take home; Dr. Nori Riis gyn one time a year; Defers to mammogram and GYN;   Exercise Activities and Dietary recommendations Current Exercise Habits:: Home exercise routine, Type of exercise: Other - see comments;strength training/weights (bikes 3 miles per day), Time (Minutes): 30, Frequency (Times/Week): 5 (adding more pilates), Weekly Exercise (Minutes/Week): 150, Intensity: Moderate  Goals    . Exercise 3x per week (30 min per time)     Would like to start back exercises; will try pilates       Fall Risk Fall Risk  05/06/2015  Falls in the past year? Yes  Number falls in past yr: 1  Injury with Fall? No  Follow up Education provided   Depression Screen PHQ 2/9 Scores 05/06/2015  PHQ - 2 Score 0     Cognitive Testing No flowsheet data found.  Immunization History  Administered Date(s) Administered  . Influenza Split 09/28/2011, 09/28/2012  . Influenza Whole 09/10/2006, 08/25/2007, 08/30/2008, 08/13/2009, 09/26/2010  . Influenza, Seasonal, Injecte, Preservative Fre 09/01/2013  . Pneumococcal Conjugate-13 09/28/2013  . Pneumococcal Polysaccharide-23 08/09/2005, 08/10/2005, 03/27/2011  . Td 11/09/1989, 08/13/2009  . Zoster 07/28/2006   Screening Tests Health Maintenance  Topic Date Due  . COLONOSCOPY  07/04/2010  . MAMMOGRAM  03/04/2015  . INFLUENZA VACCINE  06/10/2015  . TETANUS/TDAP  08/14/2019  . DEXA SCAN  Completed  . ZOSTAVAX  Completed  . PNA vac Low Risk Adult  Completed      Plan:   Plan   The patient agrees to: Check with doctor about back exercises; will  try pilates for back strengthening Will continue with good health habits; Eats heart healthy diet;  Advanced directive: completed   During the course of the visit the patient was educated and counseled about the following appropriate screening and preventive services:   Vaccines to include Pneumoccal, Influenza, Hepatitis B, Td, Zostavax, HCV/immunications  Electrocardiogram; deferred  Cardiovascular Disease/ no chest pain  Colorectal cancer screening;   Bone density screening; due in 2018  Diabetes screening/ n/a  Glaucoma screening/ ongoing treatment  Mammography/PAP/ refer to Dr. Nori Riis  Nutrition counseling / heart healthy diet  Patient Instructions (the written plan) was given to the patient.   Wynetta Fines, RN  05/06/2015  Medical screening examination/treatment/procedure(s) were performed by non-physician practitioner and as supervising physician I was immediately available for consultation/collaboration. I agree with above. Cathlean Cower, MD

## 2015-05-10 NOTE — Addendum Note (Signed)
Addended byWynetta Fines on: 05/10/2015 12:29 PM   Modules accepted: Miquel Dunn

## 2015-05-20 NOTE — Telephone Encounter (Signed)
Call was placed to [facility]; awaiting mammogram fax

## 2015-08-09 ENCOUNTER — Other Ambulatory Visit: Payer: Self-pay | Admitting: Internal Medicine

## 2015-08-14 DIAGNOSIS — M4806 Spinal stenosis, lumbar region: Secondary | ICD-10-CM | POA: Diagnosis not present

## 2015-08-14 DIAGNOSIS — M5417 Radiculopathy, lumbosacral region: Secondary | ICD-10-CM | POA: Diagnosis not present

## 2015-08-23 ENCOUNTER — Other Ambulatory Visit (INDEPENDENT_AMBULATORY_CARE_PROVIDER_SITE_OTHER): Payer: Medicare HMO

## 2015-08-23 DIAGNOSIS — I10 Essential (primary) hypertension: Secondary | ICD-10-CM

## 2015-08-23 DIAGNOSIS — E785 Hyperlipidemia, unspecified: Secondary | ICD-10-CM

## 2015-08-23 DIAGNOSIS — Z0189 Encounter for other specified special examinations: Secondary | ICD-10-CM | POA: Diagnosis not present

## 2015-08-23 DIAGNOSIS — Z0000 Encounter for general adult medical examination without abnormal findings: Secondary | ICD-10-CM

## 2015-08-23 LAB — CBC WITH DIFFERENTIAL/PLATELET
BASOS PCT: 0.2 % (ref 0.0–3.0)
Basophils Absolute: 0 10*3/uL (ref 0.0–0.1)
EOS PCT: 1.4 % (ref 0.0–5.0)
Eosinophils Absolute: 0.1 10*3/uL (ref 0.0–0.7)
HCT: 43 % (ref 36.0–46.0)
Hemoglobin: 14.3 g/dL (ref 12.0–15.0)
Lymphocytes Relative: 24.8 % (ref 12.0–46.0)
Lymphs Abs: 2.1 10*3/uL (ref 0.7–4.0)
MCHC: 33.2 g/dL (ref 30.0–36.0)
MCV: 97.2 fl (ref 78.0–100.0)
MONOS PCT: 7.9 % (ref 3.0–12.0)
Monocytes Absolute: 0.7 10*3/uL (ref 0.1–1.0)
NEUTROS ABS: 5.4 10*3/uL (ref 1.4–7.7)
Neutrophils Relative %: 65.7 % (ref 43.0–77.0)
Platelets: 283 10*3/uL (ref 150.0–400.0)
RBC: 4.42 Mil/uL (ref 3.87–5.11)
RDW: 14.4 % (ref 11.5–15.5)
WBC: 8.3 10*3/uL (ref 4.0–10.5)

## 2015-08-23 LAB — URINALYSIS, ROUTINE W REFLEX MICROSCOPIC
BILIRUBIN URINE: NEGATIVE
Hgb urine dipstick: NEGATIVE
Ketones, ur: NEGATIVE
Leukocytes, UA: NEGATIVE
Nitrite: NEGATIVE
PH: 5.5 (ref 5.0–8.0)
RBC / HPF: NONE SEEN (ref 0–?)
Specific Gravity, Urine: 1.025 (ref 1.000–1.030)
Total Protein, Urine: NEGATIVE
Urine Glucose: NEGATIVE
Urobilinogen, UA: 0.2 (ref 0.0–1.0)
WBC, UA: NONE SEEN (ref 0–?)

## 2015-08-23 LAB — LIPID PANEL
CHOLESTEROL: 264 mg/dL — AB (ref 0–200)
HDL: 92.9 mg/dL (ref >39.00)
LDL Cholesterol: 145 mg/dL — ABNORMAL HIGH (ref 0–99)
NONHDL: 171.28
Total CHOL/HDL Ratio: 3
Triglycerides: 130 mg/dL (ref 0.0–149.0)
VLDL: 26 mg/dL (ref 0.0–40.0)

## 2015-08-23 LAB — HEPATIC FUNCTION PANEL
ALBUMIN: 4.3 g/dL (ref 3.5–5.2)
ALK PHOS: 69 U/L (ref 39–117)
ALT: 18 U/L (ref 0–35)
AST: 16 U/L (ref 0–37)
Bilirubin, Direct: 0.1 mg/dL (ref 0.0–0.3)
Total Bilirubin: 0.6 mg/dL (ref 0.2–1.2)
Total Protein: 7.6 g/dL (ref 6.0–8.3)

## 2015-08-23 LAB — BASIC METABOLIC PANEL
BUN: 25 mg/dL — ABNORMAL HIGH (ref 6–23)
CALCIUM: 10.8 mg/dL — AB (ref 8.4–10.5)
CO2: 30 mEq/L (ref 19–32)
Chloride: 103 mEq/L (ref 96–112)
Creatinine, Ser: 1.47 mg/dL — ABNORMAL HIGH (ref 0.40–1.20)
GFR: 36.81 mL/min — ABNORMAL LOW (ref >60.00)
GLUCOSE: 93 mg/dL (ref 70–99)
Potassium: 5.2 mEq/L — ABNORMAL HIGH (ref 3.5–5.1)
SODIUM: 143 meq/L (ref 135–145)

## 2015-08-23 LAB — TSH: TSH: 5.09 u[IU]/mL — ABNORMAL HIGH (ref 0.35–4.50)

## 2015-08-28 ENCOUNTER — Ambulatory Visit (INDEPENDENT_AMBULATORY_CARE_PROVIDER_SITE_OTHER): Payer: Medicare HMO | Admitting: Internal Medicine

## 2015-08-28 ENCOUNTER — Encounter: Payer: Self-pay | Admitting: Internal Medicine

## 2015-08-28 VITALS — BP 118/80 | HR 75 | Wt 139.0 lb

## 2015-08-28 DIAGNOSIS — N183 Chronic kidney disease, stage 3 unspecified: Secondary | ICD-10-CM

## 2015-08-28 DIAGNOSIS — E785 Hyperlipidemia, unspecified: Secondary | ICD-10-CM

## 2015-08-28 DIAGNOSIS — Z0000 Encounter for general adult medical examination without abnormal findings: Secondary | ICD-10-CM

## 2015-08-28 DIAGNOSIS — Z0001 Encounter for general adult medical examination with abnormal findings: Secondary | ICD-10-CM | POA: Insufficient documentation

## 2015-08-28 DIAGNOSIS — Z86010 Personal history of colonic polyps: Secondary | ICD-10-CM

## 2015-08-28 DIAGNOSIS — Z23 Encounter for immunization: Secondary | ICD-10-CM

## 2015-08-28 DIAGNOSIS — I10 Essential (primary) hypertension: Secondary | ICD-10-CM

## 2015-08-28 NOTE — Addendum Note (Signed)
Addended by: Cresenciano Lick on: 08/28/2015 03:22 PM   Modules accepted: Orders

## 2015-08-28 NOTE — Progress Notes (Signed)
Pre visit review using our clinic review tool, if applicable. No additional management support is needed unless otherwise documented below in the visit note. 

## 2015-08-28 NOTE — Assessment & Plan Note (Signed)
Improve with diet only last yr, now elevated again, to cont diet , declines statin at this time

## 2015-08-28 NOTE — Progress Notes (Signed)
Subjective:    Patient ID: Carolyn Wells, female    DOB: 06/17/1941, 74 y.o.   MRN: [id number]  HPI  Here for wellness and f/u;  Overall doing ok;  Pt denies Chest pain, worsening SOB, DOE, wheezing, orthopnea, PND, worsening LE edema, palpitations, dizziness or syncope.  Pt denies neurological change such as new headache, facial or extremity weakness.  Pt denies polydipsia, polyuria, or low sugar symptoms. Pt states overall good compliance with treatment and medications, good tolerability, and has been trying to follow appropriate diet.  Pt denies worsening depressive symptoms, suicidal ideation or panic. No fever, night sweats, wt loss, loss of appetite, or other constitutional symptoms.  Pt states good ability with ADL's, has low fall risk, home safety reviewed and adequate, no other significant changes in hearing or vision, and only occasionally active with exercise. Pt continues to have recurring LBP without change in severity, bowel or bladder change, fever, wt loss,  worsening LE pain/numbness/weakness, gait change or falls. Past Medical History  Diagnosis Date  . HYPERLIPIDEMIA 09/27/2010  . OBESITY 08/04/2007  . HYPERTENSION 08/06/2007  . BRADYCARDIA 09/26/2010  . INTERNAL HEMORRHOIDS 08/04/2007  . GASTROESOPHAGEAL REFLUX DISEASE 08/04/2007  . ABDOMINAL INCISIONAL HERNIA 08/13/2009  . DIVERTICULOSIS, COLON 08/25/2007  . LOW BACK PAIN 08/06/2007  . BACK PAIN 04/09/2009  . ARM PAIN, LEFT 06/05/2010  . OSTEOPENIA 08/04/2007  . RASH-NONVESICULAR 08/30/2008  . WEIGHT LOSS 08/13/2009  . COLONIC POLYPS, HX OF 08/25/2007  . Solitary kidney, acquired 09/28/2011  . Allergic rhinitis, cause unspecified 09/28/2011  . CKD (chronic kidney disease), stage III 02/26/2015    Solitary kidney   Past Surgical History  Procedure Laterality Date  . Nephrectomy      Right  . S/o incisional hernia repair      RUQ    reports that she has never smoked. She does not have any smokeless tobacco history on  file. She reports that she does not drink alcohol or use illicit drugs. family history includes Coronary artery disease (age of onset: 70) in her other; Diabetes in her mother and other; Heart disease in her father. Allergies  Allergen Reactions  . Hydrocodone Nausea Only and Other (See Comments)    lethargy   Current Outpatient Prescriptions on File Prior to Visit  Medication Sig Dispense Refill  . amLODipine (NORVASC) 10 MG tablet Take 1 tablet (10 mg total) by mouth daily. 90 tablet 3  . Ascorbic Acid (VITAMIN C) 1000 MG tablet Take 1,000 mg by mouth daily.    [name] aspirin 81 MG EC tablet Take 81 mg by mouth daily.      . Calcium Citrate (CITRACAL PO) Take by mouth daily.    . diclofenac sodium (VOLTAREN) 1 % GEL Apply topically 4 (four) times daily.    . Garlic (GARLIQUE PO) Take by mouth.    . levothyroxine (SYNTHROID, LEVOTHROID) 25 MCG tablet TAKE ONE TABLET BY MOUTH ONCE DAILY 90 tablet 3  . Omega-3 Krill Oil 300 MG CAPS Take 1 capsule by mouth.    . pantoprazole (PROTONIX) 40 MG tablet TAKE ONE TABLET BY MOUTH ONCE DAILY 90 tablet 1  . pimecrolimus (ELIDEL) 1 % cream Apply topically 2 (two) times daily. 30 g 0  . TURMERIC PO Take by mouth.    . ALPRAZolam (XANAX) 0.5 MG tablet Take 1 tablet (0.5 mg total) by mouth at bedtime as needed for sleep. (Patient not taking: Reported on 08/28/2015) 30 tablet 5  . brimonidine-timolol (COMBIGAN) 0.2-0.5 % ophthalmic  solution Place 1 drop into both eyes every 12 (twelve) hours.    . Multiple Vitamin (MULTIVITAMIN) tablet Take 1 tablet by mouth daily.     No current facility-administered medications on file prior to visit.   Review of Systems Constitutional: Negative for increased diaphoresis, other activity, appetite or siginficant weight change other than noted HENT: Negative for worsening hearing loss, ear pain, facial swelling, mouth sores and neck stiffness.   Eyes: Negative for other worsening pain, redness or visual disturbance.    Respiratory: Negative for shortness of breath and wheezing  Cardiovascular: Negative for chest pain and palpitations.  Gastrointestinal: Negative for diarrhea, blood in stool, abdominal distention or other pain Genitourinary: Negative for hematuria, flank pain or change in urine volume.  Musculoskeletal: Negative for myalgias or other joint complaints.  Skin: Negative for color change and wound or drainage.  Neurological: Negative for syncope and numbness. other than noted Hematological: Negative for adenopathy. or other swelling Psychiatric/Behavioral: Negative for hallucinations, SI, self-injury, decreased concentration or other worsening agitation.      Objective:   Physical Exam BP 118/80 mmHg  Pulse 75  Wt 139 lb (63.05 kg)  SpO2 98% VS noted,  Constitutional: Pt is oriented to person, place, and time. Appears well-developed and well-nourished, in no significant distress Head: Normocephalic and atraumatic.  Right Ear: External ear normal.  Left Ear: External ear normal.  Nose: Nose normal.  Mouth/Throat: Oropharynx is clear and moist.  Eyes: Conjunctivae and EOM are normal. Pupils are equal, round, and reactive to light.  Neck: Normal range of motion. Neck supple. No JVD present. No tracheal deviation present or significant neck LA or mass Cardiovascular: Normal rate, regular rhythm, normal heart sounds and intact distal pulses.   Pulmonary/Chest: Effort normal and breath sounds without rales or wheezing  Abdominal: Soft. Bowel sounds are normal. NT. No HSM  Musculoskeletal: Normal range of motion. Exhibits no edema.  Lymphadenopathy:  Has no cervical adenopathy.  Neurological: Pt is alert and oriented to person, place, and time. Pt has normal reflexes. No cranial nerve deficit. Motor grossly intact Skin: Skin is warm and dry. No rash noted.  Psychiatric:  Has normal mood and affect. Behavior is normal.      Assessment & Plan:

## 2015-08-28 NOTE — Assessment & Plan Note (Signed)
stable overall by history and exam, recent data reviewed with pt, and pt to continue medical treatment as before,  to f/u any worsening symptoms or concerns Lab Results  Component Value Date   CREATININE 1.47* 08/23/2015

## 2015-08-28 NOTE — Patient Instructions (Addendum)
You had the flu shot today  Please continue all other medications as before, and refills have been done if requested.  Please have the pharmacy call with any other refills you may need.  Please continue your efforts at being more active, low cholesterol diet, and weight control.  You are otherwise up to date with prevention measures today.  Please keep your appointments with your specialists as you may have planned  You will be contacted regarding the referral for: colonoscopy with Dr Henrene Pastor  Please return in 1 year for your yearly visit, or sooner if needed, with Lab testing done 3-5 days before

## 2015-08-28 NOTE — Assessment & Plan Note (Signed)
Pt now will ing for f/u colonsocpy

## 2015-08-28 NOTE — Assessment & Plan Note (Signed)

## 2015-08-28 NOTE — Assessment & Plan Note (Signed)
stable overall by history and exam, recent data reviewed with pt, and pt to continue medical treatment as before,  to f/u any worsening symptoms or concerns le . BP Readings from Last 3 Encounters:  08/28/15 118/80  05/06/15 124/86  02/26/15 124/80

## 2015-10-02 ENCOUNTER — Other Ambulatory Visit: Payer: Self-pay | Admitting: Internal Medicine

## 2015-10-11 ENCOUNTER — Encounter: Payer: Self-pay | Admitting: Internal Medicine

## 2015-10-23 DIAGNOSIS — M5417 Radiculopathy, lumbosacral region: Secondary | ICD-10-CM | POA: Diagnosis not present

## 2015-10-23 DIAGNOSIS — I10 Essential (primary) hypertension: Secondary | ICD-10-CM | POA: Diagnosis not present

## 2015-10-23 DIAGNOSIS — M4806 Spinal stenosis, lumbar region: Secondary | ICD-10-CM | POA: Diagnosis not present

## 2015-11-05 ENCOUNTER — Other Ambulatory Visit: Payer: Self-pay | Admitting: Internal Medicine

## 2015-12-03 ENCOUNTER — Ambulatory Visit (AMBULATORY_SURGERY_CENTER): Payer: Self-pay

## 2015-12-03 VITALS — Ht 67.0 in | Wt 144.0 lb

## 2015-12-03 DIAGNOSIS — Z86010 Personal history of colonic polyps: Secondary | ICD-10-CM

## 2015-12-03 MED ORDER — NA SULFATE-K SULFATE-MG SULF 17.5-3.13-1.6 GM/177ML PO SOLN: ORAL | Status: DC

## 2015-12-03 NOTE — Progress Notes (Signed)
Per pt, no allergies to soy or egg products.Pt not taking any weight loss meds or using  O2 at home. 

## 2015-12-17 ENCOUNTER — Ambulatory Visit (INDEPENDENT_AMBULATORY_CARE_PROVIDER_SITE_OTHER): Payer: Medicare HMO | Admitting: Physician Assistant

## 2015-12-17 ENCOUNTER — Ambulatory Visit: Payer: Medicare HMO | Admitting: Internal Medicine

## 2015-12-17 VITALS — BP 90/50 | HR 122 | Ht 67.0 in | Wt 135.8 lb

## 2015-12-17 DIAGNOSIS — I10 Essential (primary) hypertension: Secondary | ICD-10-CM | POA: Diagnosis not present

## 2015-12-17 DIAGNOSIS — I4891 Unspecified atrial fibrillation: Secondary | ICD-10-CM | POA: Diagnosis not present

## 2015-12-17 DIAGNOSIS — E785 Hyperlipidemia, unspecified: Secondary | ICD-10-CM

## 2015-12-17 DIAGNOSIS — N183 Chronic kidney disease, stage 3 unspecified: Secondary | ICD-10-CM

## 2015-12-17 LAB — CBC WITH DIFFERENTIAL/PLATELET
BASOS ABS: 0 10*3/uL (ref 0.0–0.1)
Basophils Relative: 0 % (ref 0–1)
EOS PCT: 0 % (ref 0–5)
Eosinophils Absolute: 0 10*3/uL (ref 0.0–0.7)
HEMATOCRIT: 42.6 % (ref 36.0–46.0)
Hemoglobin: 14.9 g/dL (ref 12.0–15.0)
LYMPHS ABS: 1.2 10*3/uL (ref 0.7–4.0)
Lymphocytes Relative: 15 % (ref 12–46)
MCH: 32.6 pg (ref 26.0–34.0)
MCHC: 35 g/dL (ref 30.0–36.0)
MCV: 93.2 fL (ref 78.0–100.0)
MONO ABS: 0.8 10*3/uL (ref 0.1–1.0)
MONOS PCT: 10 % (ref 3–12)
MPV: 11 fL (ref 8.6–12.4)
Neutro Abs: 5.9 10*3/uL (ref 1.7–7.7)
Neutrophils Relative %: 75 % (ref 43–77)
Platelets: 326 10*3/uL (ref 150–400)
RBC: 4.57 MIL/uL (ref 3.87–5.11)
RDW: 13.6 % (ref 11.5–15.5)
WBC: 7.8 10*3/uL (ref 4.0–10.5)

## 2015-12-17 LAB — BASIC METABOLIC PANEL
BUN: 19 mg/dL (ref 7–25)
CHLORIDE: 101 mmol/L (ref 98–110)
CO2: 26 mmol/L (ref 20–31)
CREATININE: 1.7 mg/dL — AB (ref 0.60–0.93)
Calcium: 10.1 mg/dL (ref 8.6–10.4)
Glucose, Bld: 106 mg/dL — ABNORMAL HIGH (ref 65–99)
Potassium: 5.3 mmol/L (ref 3.5–5.3)
Sodium: 142 mmol/L (ref 135–146)

## 2015-12-17 LAB — TSH: TSH: 3.74 mIU/L

## 2015-12-17 MED ORDER — RIVAROXABAN 15 MG PO TABS: 15.0000 mg | ORAL_TABLET | Freq: Every day | ORAL | Status: DC

## 2015-12-17 MED ORDER — METOPROLOL TARTRATE 25 MG PO TABS: 25.0000 mg | ORAL_TABLET | Freq: Two times a day (BID) | ORAL | Status: DC

## 2015-12-17 NOTE — Progress Notes (Signed)
Cardiology Office Note:    Date:  12/17/2015   ID:  Carolyn Wells, DOB May 21, 1941, MRN [id number]  PCP:  Cathlean Cower, MD  Cardiologist:  New - Dr. Liam Rogers   Electrophysiologist:  n/a  Referring MD:  Dr. Scarlette Shorts  Chief Complaint  Patient presents with  . Atrial Fibrillation    New Consult    History of Present Illness:     Carolyn Wells is a 74 y.o. female with a hx of HTN, HL, CKD stage 3 (solitary kidney).  Patient presented today for colonoscopy today and noted to be in AFib with RVR.  She is added on for further evaluation.  She is not aware that she is in AFib.  She denies chest pain, dyspnea, fatigue, syncope. She is very active.  She exercises daily.  She denies chest pain or DOE. She denies any orthopnea, PND, edema.  She denies a hx of syncope.  She denies any hx of bleeding problems.  No recent illnesses.  She notes the colon prep was particularly difficult.  She has not eaten in 2 days.   CHADS2-VASc=4 (female, age 80, HTN). Creatinine Clearance (based on Creatinine from 10/16):  31.97 mL/min. Anticoagulation choices: Xarelto 15 mg QD; Eliquis 5 mg bid; Pradaxa 150 mg bid; Coumadin   Past Medical History  Diagnosis Date  . HYPERLIPIDEMIA 09/27/2010    not on Rx therapy  . HYPERTENSION 08/06/2007  . BRADYCARDIA 09/26/2010  . INTERNAL HEMORRHOIDS 08/04/2007  . GASTROESOPHAGEAL REFLUX DISEASE 08/04/2007  . ABDOMINAL INCISIONAL HERNIA 08/13/2009    s/p repair  . DIVERTICULOSIS, COLON 08/25/2007  . LOW BACK PAIN 08/06/2007    scoliosis; DJD; gets injections  . OSTEOPENIA 08/04/2007  . COLONIC POLYPS, HX OF 08/25/2007  . Solitary kidney, acquired 09/28/2011    donated R kidney to husband  . Allergic rhinitis, cause unspecified 09/28/2011  . CKD (chronic kidney disease), stage III 02/26/2015    Solitary kidney  . Hypothyroidism     Past Surgical History  Procedure Laterality Date  . Nephrectomy      Right  . S/o incisional hernia repair      RUQ  .  Laminectomy  1999    L4-5/lumbar  . Tonsillectomy      Current Medications: Outpatient Prescriptions Prior to Visit  Medication Sig Dispense Refill  . Ascorbic Acid (VITAMIN C) 1000 MG tablet Take 1,000 mg by mouth daily.    [name] aspirin 81 MG EC tablet Take 81 mg by mouth daily.      . Calcium Citrate (CITRACAL PO) Take by mouth daily.    . diclofenac sodium (VOLTAREN) 1 % GEL Apply topically as needed.     . Garlic (GARLIQUE PO) Take 1 tablet by mouth daily.     [name] levothyroxine (SYNTHROID, LEVOTHROID) 25 MCG tablet TAKE ONE TABLET BY MOUTH ONCE DAILY 90 tablet 1  . LUMIGAN 0.01 % SOLN Place 1 drop into both eyes at bedtime.     . Omega-3 Fatty Acids (FISH OIL TRIPLE STRENGTH) 1400 MG CAPS Take 1 capsule by mouth daily. Fish Oil 1000 mg daily    . Omega-3 Krill Oil 300 MG CAPS Take 1 capsule by mouth.    . pantoprazole (PROTONIX) 40 MG tablet TAKE ONE TABLET BY MOUTH ONCE DAILY 90 tablet 1  . Plant Sterols and Stanols (CHOLEST OFF PO) Take 900 mg by mouth daily.    . TURMERIC PO Take 2 tablets by mouth daily. Take 2 tablets daily    .  amLODipine (NORVASC) 10 MG tablet TAKE ONE TABLET BY MOUTH ONCE DAILY 90 tablet 1  . ALPRAZolam (XANAX) 0.5 MG tablet Take 1 tablet (0.5 mg total) by mouth at bedtime as needed for sleep. (Patient not taking: Reported on 08/28/2015) 30 tablet 5  . brimonidine-timolol (COMBIGAN) 0.2-0.5 % ophthalmic solution Place 1 drop into both eyes every 12 (twelve) hours. Reported on 12/17/2015    . diphenhydramine-acetaminophen (TYLENOL PM) 25-500 MG TABS tablet Take 2 tablets by mouth at bedtime as needed. Reported on 12/17/2015    . Multiple Vitamin (MULTIVITAMIN) tablet Take 1 tablet by mouth daily. Reported on 12/17/2015    . Na Sulfate-K Sulfate-Mg Sulf (SUPREP BOWEL PREP) SOLN Suprep as directed / no substitutions (Patient not taking: Reported on 12/17/2015) 354 mL 0  . pimecrolimus (ELIDEL) 1 % cream Apply topically 2 (two) times daily. (Patient not taking: Reported on  12/03/2015) 30 g 0   No facility-administered medications prior to visit.     Allergies:   Hydrocodone   Social History   Social History  . Marital Status: Married    Spouse Name: [name]  . Number of Children: 2  . Years of Education: [name]   Occupational History  . retired [employer]    Social History Main Topics  . Smoking status: Never Smoker   . Smokeless tobacco: Never Used  . Alcohol Use: No  . Drug Use: No  . Sexual Activity: Not Asked   Other Topics Concern  . None   Social History Narrative   Husband disabled, renal failure, DM, HTN.   Originally from [place], [place] >> came to [place] 1966 (husband in [employer] treasure[employer])        Family History:  The patient's family history includes Alzheimer's disease in her brother; Coronary artery disease (age of onset: 20) in her mother; Diabetes in her mother and other; Heart attack (age of onset: 25) in her father.   ROS:   Please see the history of present illness.    Review of Systems  Cardiovascular: Positive for irregular heartbeat.  Musculoskeletal: Positive for back pain.  All other systems reviewed and are negative.   Physical Exam:    VS:  BP 90/50 mmHg  Pulse 122  Ht 5\' 7"  (1.702 m)  Wt 135 lb 12.8 oz (61.598 kg)  BMI 21.26 kg/m2   GEN: Well nourished, well developed, in no acute distress HEENT: normal Neck: no JVD, no masses Cardiac: Normal S1/S2, irregularly irregular rhythm; no murmurs,   no edema;  no carotid bruits,   Respiratory:  clear to auscultation bilaterally; no wheezing, rhonchi or rales GI: soft, nontender, nondistended, + BS MS: no deformity or atrophy Skin: warm and dry, no rash Neuro:  no focal deficits  Psych: Alert and oriented x 3, normal affect  Wt Readings from Last 3 Encounters:  12/17/15 135 lb 12.8 oz (61.598 kg)  12/03/15 144 lb (65.318 kg)  08/28/15 139 lb (63.05 kg)      Studies/Labs Reviewed:     EKG:  EKG is  ordered today.  The ekg ordered today demonstrates atrial  fibrillation, HR 125, 1-2 mm inferolateral ST depression  Recent Labs: 08/23/2015: ALT 18; BUN 25*; Creatinine, Ser 1.47*; Hemoglobin 14.3; Platelets 283.0; Potassium 5.2*; Sodium 143; TSH 5.09*   Recent Lipid Panel    Component Value Date/Time   CHOL 264* 08/23/2015 1009   TRIG 130.0 08/23/2015 1009   HDL 92.90 08/23/2015 1009   CHOLHDL 3 08/23/2015 1009   VLDL 26.0  08/23/2015 1009   LDLCALC 145* 08/23/2015 1009   LDLDIRECT 117.7 09/26/2013 1420    Additional studies/ records that were reviewed today include:   Tele strips from GI - AFib, HR 120s   ASSESSMENT:     1. Atrial fibrillation with RVR ( AFB)   2. Essential hypertension   3. Hyperlipidemia   4. CKD (chronic kidney disease), stage III     PLAN:     In order of problems listed above:  1. Atrial Fibrillation with RVR- New onset.  She is completely asymptomatic.  She has some ST depression on her ECG.  But, this may be from aberrancy.  She has not hx of chest pain or DOE.  BP is low but she just completed the colon prep and has not eaten in 2 days. Technically we could start her on rate controlling medications and allow her to proceed with her colonoscopy tomorrow.  However, she is not interested in pursuing her colonoscopy again.  She denies any hx of bleeding issues. She has a CHADS2-VASc=4.  Annual stroke risk is 4.8%.  She requires long term anticoagulation.  We discussed Warfarin and NOAC alternatives.  I have suggested we start her on Xarelto 15 mg QD.  She needs rate control as well.  We will need to arrange an echo once her HR is better controlled.  -  DC Norvasc  -  Start Metoprolol Tartrate 25 mg bid  -  Start Xarelto 15 mg QD  -  Check TSH, BMET, CBC today  -  FU with me in 1 week.  -  Arrange Echo next week  -  If she remains in AFib, consider arranging DCCV after 1 month of uninterrupted anticoagulation.    -  She has a hx of bradycardia.  Hopefully, HR will remain stable in NSR with the addition of  beta-blocker   2. HTN - BP somewhat low.  She is asymptomatic.  She has recently completed colon prep for a colonoscopy.  DC Norvasc.  Start Metoprolol as noted above.   3. HL - Managed by PCP.  4. CKD - She has a solitary kidney due to transplant donation to her husband years ago.  She will have a FU BMET today.  CrCl is 32.  She will be started on Xarelto 15 mg QD.   5. Hypothyroidism - Check TSH today given new onset AFib.    6. Abnormal ECG - ST depression may be related to aberrancy.  If ST depression continues with slower rate, we can consider stress testing at some point.      Medication Adjustments/Labs and Tests Ordered: Current medicines are reviewed at length with the patient today.  Concerns regarding medicines are outlined above.  Medication changes, Labs and Tests ordered today are outlined in the Patient Instructions noted below. Patient Instructions  Medication Instructions:  STOP taking Amlodipine (Norvasc) START taking Metoprolol Tartrate 25 mg Twice daily  START taking Xarelto 15 mg Once daily   Labwork: Today - TSH, BMET, CBC  Testing/Procedures: Schedule Echocardiogram - Next week. Make sure Echo is scheduled on a day after the follow up with Richardson Dopp, PA-C   Follow-Up: Richardson Dopp, PA-C in 1 week.  Any Other Special Instructions Will Be Listed Below (If Applicable).  If you need a refill on your cardiac medications before your next appointment, please call your pharmacy.     Signed, Richardson Dopp, PA-C  12/17/2015 10:22 AM    [facility] [id number]  [address] [place], [place]  [address] Phone: [phone]; Fax: [phone]    Attending Note:   The patient was seen and examined.  Agree with assessment and plan as noted above.  Changes made to the above note as needed.  Pt was found to have new dx of atrial fib.   Needs better rate control and anticoagulaton . Will need to hold anticoagulation if / when she is rescheduled  for her colonoscopy. Needs an echo for evaluation of cardiac size and functino .   Will see her in several months   Ramond Dial., MD, [facility] 12/17/2015, 6:51 PM [address],  [place] Pager [phone]

## 2015-12-17 NOTE — Progress Notes (Signed)
Pt came into admitting when vitals where taken [facility] CMA noticed irregular heart rhythm, had pt get undressed and connected pt to monitor to see heart rhythm, pt rate ranged from 120 to 160 irregular consistent with A Fib , verified ekg strip with [facility] Monday CRNA, and pt denies and hx of heart issues, no documentation on previous A Fib, pt denies any symptoms,Last EKG was 2014 Bradycardia, advised pt of findings and Dr Henrene Pastor would be contacted to see what he wants to do from there, paged Dr Henrene Pastor, spoke with Barkley Boards Dr [facility] CRNA and advised of ekg, Dr Henrene Pastor called back and was advised on pt status, Dr Henrene Pastor advised to cancel procedure due to EKG findings and have pt set up appt for today with Primary Dr or Cardiologist if pt is already established, pt does not have Cardiologist but see Dr Jenny Reichmann for primary care.We will call to set appt up with Dr Jenny Reichmann, primary care could not work her in, was finally able to get her into [facility], pt advised and on her way over to [facility]

## 2015-12-17 NOTE — Patient Instructions (Addendum)
Medication Instructions:  STOP taking Amlodipine (Norvasc) START taking Metoprolol Tartrate 25 mg Twice daily  START taking Xarelto 15 mg Once daily   Labwork: Today - TSH, BMET, CBC  Testing/Procedures: Schedule Echocardiogram - Next week. Make sure Echo is scheduled on a day after the follow up with Richardson Dopp, PA-C   Follow-Up: Richardson Dopp, PA-C in 1 week.  Any Other Special Instructions Will Be Listed Below (If Applicable).  If you need a refill on your cardiac medications before your next appointment, please call your pharmacy.

## 2015-12-18 ENCOUNTER — Telehealth: Payer: Self-pay | Admitting: *Deleted

## 2015-12-18 NOTE — Telephone Encounter (Signed)
Pt notified of lab results by phone with verbal understanding. Verified appt 2/16 with PA

## 2015-12-25 NOTE — Progress Notes (Signed)
Cardiology Office Note:    Date:  12/26/2015   ID:  Carolyn Wells, DOB 11/22/1940, MRN [id number]  PCP:  Carolyn Cower, MD  Cardiologist:  New - Dr. Liam Wells   Electrophysiologist:  n/a  Referring MD:  Dr. Scarlette Wells  Chief Complaint  Patient presents with  . Atrial Fibrillation    Follow up    History of Present Illness:     Carolyn Wells is a 75 y.o. female with a hx of HTN, HL, CKD stage 3 (solitary kidney).  I saw her last week after she was noted to be in AF with RVR when she presented for routine colonoscopy. I saw her with Dr. Acie Wells. We placed her on Xarelto, metoprolol tartrate and brought her back today for follow-up. She is doing well. She denies chest pain, shortness of breath, syncope, palpitations, orthopnea, PND or edema. Blood pressures at home have been optimal.  CHADS2-VASc=4 (female, age 49, HTN). Estimated Creatinine Clearance: 27.8 mL/min (by C-G formula based on Cr of 1.7).    Past Medical History  Diagnosis Date  . HYPERLIPIDEMIA 09/27/2010    not on Rx therapy  . HYPERTENSION 08/06/2007  . BRADYCARDIA 09/26/2010  . INTERNAL HEMORRHOIDS 08/04/2007  . GASTROESOPHAGEAL REFLUX DISEASE 08/04/2007  . ABDOMINAL INCISIONAL HERNIA 08/13/2009    s/p repair  . DIVERTICULOSIS, COLON 08/25/2007  . LOW BACK PAIN 08/06/2007    scoliosis; DJD; gets injections  . OSTEOPENIA 08/04/2007  . COLONIC POLYPS, HX OF 08/25/2007  . Solitary kidney, acquired 09/28/2011    donated R kidney to husband  . Allergic rhinitis, cause unspecified 09/28/2011  . CKD (chronic kidney disease), stage III 02/26/2015    Solitary kidney  . Hypothyroidism     Past Surgical History  Procedure Laterality Date  . Nephrectomy      Right  . S/o incisional hernia repair      RUQ  . Laminectomy  1999    L4-5/lumbar  . Tonsillectomy      Current Medications: Outpatient Prescriptions Prior to Visit  Medication Sig Dispense Refill  . Ascorbic Acid (VITAMIN C) 1000 MG tablet Take  1,000 mg by mouth daily.    [name] aspirin 81 MG EC tablet Take 81 mg by mouth daily.      . Calcium Citrate (CITRACAL PO) Take 2 tablets by mouth daily.     . diclofenac sodium (VOLTAREN) 1 % GEL Apply 1 application topically daily as needed (FOR BACK PAIN).     . Garlic (GARLIQUE PO) Take 1 tablet by mouth daily.     [name] levothyroxine (SYNTHROID, LEVOTHROID) 25 MCG tablet TAKE ONE TABLET BY MOUTH ONCE DAILY 90 tablet 1  . LUMIGAN 0.01 % SOLN Place 1 drop into both eyes at bedtime.     . metoprolol tartrate (LOPRESSOR) 25 MG tablet Take 1 tablet (25 mg total) by mouth 2 (two) times daily. 60 tablet 6  . Omega-3 Fatty Acids (FISH OIL TRIPLE STRENGTH) 1400 MG CAPS Take 1 capsule by mouth daily. Fish Oil 1000 mg daily    . Omega-3 Krill Oil 300 MG CAPS Take 1 capsule by mouth.    . pantoprazole (PROTONIX) 40 MG tablet TAKE ONE TABLET BY MOUTH ONCE DAILY 90 tablet 1  . Plant Sterols and Stanols (CHOLEST OFF PO) Take 900 mg by mouth daily.    . Rivaroxaban (XARELTO) 15 MG TABS tablet Take 1 tablet (15 mg total) by mouth daily with supper. 30 tablet 6  . TURMERIC PO Take  2 tablets by mouth once  Daily Patient not sure of dosage.     No facility-administered medications prior to visit.     Allergies:   Hydrocodone   Social History   Social History  . Marital Status: Married    Spouse Name: [name]  . Number of Children: 2  . Years of Education: [name]   Occupational History  . retired Artist [employer]    Social History Main Topics  . Smoking status: Never Smoker   . Smokeless tobacco: Never Used  . Alcohol Use: No  . Drug Use: No  . Sexual Activity: Not Asked   Other Topics Concern  . None   Social History Narrative   Husband disabled, renal failure, DM, HTN.   Originally from [place], [place] >> came to [place] 1966 (husband in [employer] treasurer)        Family History:  The patient's family history includes Alzheimer's disease in her brother; Coronary artery disease (age of onset: 76) in her  mother; Diabetes in her mother and other; Heart attack (age of onset: 31) in her father.   ROS:   Please see the history of present illness.    ROSAll other systems reviewed and are negative.   Physical Exam:    VS:  BP 144/78 mmHg  Pulse 54  Ht 5\' 7"  (1.702 m)  Wt 145 lb (65.772 kg)  BMI 22.71 kg/m2   GEN: Well nourished, well developed, in no acute distress HEENT: normal Neck: no JVD, no masses Cardiac: Normal S1/S2, RRR; no murmurs,   no edema    Respiratory:  clear to auscultation bilaterally; no wheezing, rhonchi or rales GI: soft, nontender  MS: no deformity or atrophy Skin: warm and dry  Neuro:  no focal deficits  Psych: Alert and oriented x 3, normal affect  Wt Readings from Last 3 Encounters:  12/26/15 145 lb (65.772 kg)  12/17/15 135 lb 12.8 oz (61.598 kg)  12/03/15 144 lb (65.318 kg)      Studies/Labs Reviewed:     EKG:  EKG is  ordered today.  The ekg ordered today demonstrates sinus brady, HR 55, normal axis, septal Q waves, no ST changes, QTc 397 ms  Recent Labs: 08/23/2015: ALT 18 12/17/2015: BUN 19; Creat 1.70*; Hemoglobin 14.9; Platelets 326; Potassium 5.3; Sodium 142; TSH 3.74   Recent Lipid Panel    Component Value Date/Time   CHOL 264* 08/23/2015 1009   TRIG 130.0 08/23/2015 1009   HDL 92.90 08/23/2015 1009   CHOLHDL 3 08/23/2015 1009   VLDL 26.0 08/23/2015 1009   LDLCALC 145* 08/23/2015 1009   LDLDIRECT 117.7 09/26/2013 1420    Additional studies/ records that were reviewed today include:   None    ASSESSMENT:     1. PAF (paroxysmal atrial fibrillation) ([place])   2. Essential hypertension   3. CKD (chronic kidney disease), stage III   4. Abnormal ECG   5. Hyperlipidemia     PLAN:     In order of problems listed above:  1. PAF - She is back in sinus rhythm today. Blood pressure and heart rates have been stable on current dose of metoprolol. CHADS2-VASc=4.  Continue Xarelto 15 mg QD.  Arrange BMET, CBC in 6 weeks. Echocardiogram  will be obtained today.  2. HTN - BP controlled.   3. CKD - She has a solitary kidney due to transplant donation to her husband years ago.  Check BMET in 6 weeks.  4. Abnormal ECG - ST depression  was likely related to aberrancy.  ECG today is normal.  5. HL- She would like her cholesterol rechecked. Will get FU Lipids in 6 weeks as well.   Medication Adjustments/Labs and Tests Ordered: Current medicines are reviewed at length with the patient today.  Concerns regarding medicines are outlined above.  Medication changes, Labs and Tests ordered today are outlined in the Patient Instructions noted below. Patient Instructions  Medication Instructions:  Your physician recommends that you continue on your current medications as directed. Please refer to the Current Medication list given to you today.  Labwork: 02/07/16 FOR FASTING LIPID AND LIVER PANEL, BMET, CBC W/DIFF  Testing/Procedures:  Follow-Up: 03/24/16 @ 1:45 WITH DR. Acie Wells  Any Other Special Instructions Will Be Listed Below (If Applicable).  If you need a refill on your cardiac medications before your next appointment, please call your pharmacy.    Signed, Richardson Dopp, PA-C  12/26/2015 2:45 PM    [facility] [place], [place], [place]  [address] Phone: [phone]; Fax: [phone]

## 2015-12-26 ENCOUNTER — Ambulatory Visit (INDEPENDENT_AMBULATORY_CARE_PROVIDER_SITE_OTHER): Payer: Medicare HMO | Admitting: Physician Assistant

## 2015-12-26 ENCOUNTER — Ambulatory Visit (HOSPITAL_COMMUNITY): Payer: Medicare HMO | Attending: Physician Assistant

## 2015-12-26 ENCOUNTER — Encounter: Payer: Self-pay | Admitting: Physician Assistant

## 2015-12-26 ENCOUNTER — Other Ambulatory Visit: Payer: Self-pay

## 2015-12-26 VITALS — BP 144/78 | HR 54 | Ht 67.0 in | Wt 145.0 lb

## 2015-12-26 DIAGNOSIS — I340 Nonrheumatic mitral (valve) insufficiency: Secondary | ICD-10-CM | POA: Insufficient documentation

## 2015-12-26 DIAGNOSIS — I358 Other nonrheumatic aortic valve disorders: Secondary | ICD-10-CM | POA: Insufficient documentation

## 2015-12-26 DIAGNOSIS — I351 Nonrheumatic aortic (valve) insufficiency: Secondary | ICD-10-CM | POA: Insufficient documentation

## 2015-12-26 DIAGNOSIS — N183 Chronic kidney disease, stage 3 unspecified: Secondary | ICD-10-CM

## 2015-12-26 DIAGNOSIS — R9431 Abnormal electrocardiogram [ECG] [EKG]: Secondary | ICD-10-CM | POA: Diagnosis not present

## 2015-12-26 DIAGNOSIS — I129 Hypertensive chronic kidney disease with stage 1 through stage 4 chronic kidney disease, or unspecified chronic kidney disease: Secondary | ICD-10-CM | POA: Diagnosis not present

## 2015-12-26 DIAGNOSIS — E785 Hyperlipidemia, unspecified: Secondary | ICD-10-CM | POA: Insufficient documentation

## 2015-12-26 DIAGNOSIS — I071 Rheumatic tricuspid insufficiency: Secondary | ICD-10-CM | POA: Diagnosis not present

## 2015-12-26 DIAGNOSIS — N189 Chronic kidney disease, unspecified: Secondary | ICD-10-CM | POA: Diagnosis not present

## 2015-12-26 DIAGNOSIS — I480 Paroxysmal atrial fibrillation: Secondary | ICD-10-CM | POA: Diagnosis not present

## 2015-12-26 DIAGNOSIS — I10 Essential (primary) hypertension: Secondary | ICD-10-CM | POA: Diagnosis not present

## 2015-12-26 DIAGNOSIS — I4891 Unspecified atrial fibrillation: Secondary | ICD-10-CM | POA: Insufficient documentation

## 2015-12-26 NOTE — Patient Instructions (Addendum)
Medication Instructions:  Your physician recommends that you continue on your current medications as directed. Please refer to the Current Medication list given to you today.  Labwork: 02/07/16 FOR FASTING LIPID AND LIVER PANEL, BMET, CBC W/DIFF  Testing/Procedures:  Follow-Up: 03/24/16 @ 1:45 WITH DR. Acie Fredrickson  Any Other Special Instructions Will Be Listed Below (If Applicable).  If you need a refill on your cardiac medications before your next appointment, please call your pharmacy.

## 2016-01-24 DIAGNOSIS — H401122 Primary open-angle glaucoma, left eye, moderate stage: Secondary | ICD-10-CM | POA: Diagnosis not present

## 2016-01-24 DIAGNOSIS — H04123 Dry eye syndrome of bilateral lacrimal glands: Secondary | ICD-10-CM | POA: Diagnosis not present

## 2016-01-24 DIAGNOSIS — H401112 Primary open-angle glaucoma, right eye, moderate stage: Secondary | ICD-10-CM | POA: Diagnosis not present

## 2016-01-24 DIAGNOSIS — H01001 Unspecified blepharitis right upper eyelid: Secondary | ICD-10-CM | POA: Diagnosis not present

## 2016-02-03 ENCOUNTER — Other Ambulatory Visit: Payer: Self-pay | Admitting: Internal Medicine

## 2016-02-05 ENCOUNTER — Encounter: Payer: Self-pay | Admitting: Podiatry

## 2016-02-05 ENCOUNTER — Ambulatory Visit (INDEPENDENT_AMBULATORY_CARE_PROVIDER_SITE_OTHER): Payer: Medicare HMO | Admitting: Podiatry

## 2016-02-05 ENCOUNTER — Ambulatory Visit (INDEPENDENT_AMBULATORY_CARE_PROVIDER_SITE_OTHER): Payer: Medicare HMO

## 2016-02-05 VITALS — BP 112/86 | HR 69 | Resp 12

## 2016-02-05 DIAGNOSIS — M67471 Ganglion, right ankle and foot: Secondary | ICD-10-CM | POA: Diagnosis not present

## 2016-02-05 DIAGNOSIS — R609 Edema, unspecified: Secondary | ICD-10-CM | POA: Diagnosis not present

## 2016-02-05 NOTE — Progress Notes (Signed)
   Subjective:    Patient ID: Carolyn Wells, female    DOB: 20-May-1941, 75 y.o.   MRN: [id number]  HPI    This patient presents today with a swelling in the dorsal aspect of the right foot present for approximately 6 weeks that remains swollen on and off weightbearing as well as elevation of the foot. The swelling is gradually increase in size and has a only occasional discomfort with direct pressure or walking. Patient has had no specific treatment for this problem and is able to maintain full activity. She denies any direct injury to the foot.   Review of Systems  HENT: Positive for sinus pressure.   Cardiovascular: Positive for leg swelling.  Musculoskeletal: Positive for joint swelling.       Objective:   Physical Exam  Orientated 3  Vascular: Area of localized edema dorsal first MPJ area measuring 7 x 3.5 cm. DP and PT pulses 2/4 bilaterally Capillary reflex immediate bilaterally  Neurological: Sensation to 10 g monofilament wire intact 5/5 bilaterally Vibratory sensation reactive bilaterally Ankle reflex equal and reactive bilaterally  Dermatological: No open skin lesions bilaterally Palpable soft tissue mass versus swelling dorsal right first MPJ measuring 7 cm x 3.5 cm. The lesion is not adherent to the overlying skin  Musculoskeletal: Bilateral HAV's Patient able to dorsi flex, plantarflex, invert, leave her 5/5 bilaterally There is no obvious attachment of this soft tissue lesion overlying the first metatarsal to the overlying tendons  X-ray weightbearing right foot dated 02/05/2016  Intact bony structure without fracture or dislocation Decreased bone density noted in all views Increased soft tissue density on lateral view overlying metatarsals Metatarsus adductus HAV Posterior and inferior calcaneal spurs  Radiographic impression: No acute bony abnormality noted Increased soft tissue density on one view as noted above correlate with clinical  findings            Assessment & Plan:    Assessment: Soft tissue mass possible ganglion on versus nonspecific fibrous edematous lesion dorsal aspect of right foot  Plan: Today I reviewed the results of the x-ray examination and physical examination and made patient aware that I'm suspicious that this swelling may be consistent with a ganglionic cyst. I offered her needle puncture and deposition of corticosteroid. Patient verbally consents  The skin is prepped with alcohol and Betadine and the soft tissue mass was needle and aspirated. No fluid was withdrawn. 10 mg of Kenalog mixed with 10 mg of plain Xylocaine and 2.5 mg of plain Sensorcaine were injected into the soft tissue mass in the dorsal aspect the right foot. The lesion was compressed manually and did appear to reduce slightly in size. There was no fluid noted through the needle puncture site. A compression dressing was applied with instructions to leave on 24 hours. Patient tolerated procedure without any difficulty  Reappoint 6 weeks

## 2016-02-05 NOTE — Patient Instructions (Signed)
Ganglion Cyst Today I injected the soft tissue mass on the dorsum the right foot with a steroid injection see if this soft tissue swelling will reduce Remove compression dressing within 24 hours and maintain normal activity. Will reevaluate your response in 6 weeks Pretend this is a foot A ganglion cyst is a noncancerous, fluid-filled lump that occurs near joints or tendons. The ganglion cyst grows out of a joint or the lining of a tendon. It most often develops in the hand or wrist, but it can also develop in the shoulder, elbow, hip, knee, ankle, or foot. The round or oval ganglion cyst can be the size of a pea or larger than a grape. Increased activity may enlarge the size of the cyst because more fluid starts to build up.  CAUSES It is not known what causes a ganglion cyst to grow. However, it may be related to:  Inflammation or irritation around the joint.  An injury.  Repetitive movements or overuse.  Arthritis. RISK FACTORS Risk factors include:  Being a woman.  Being age 77-50. SIGNS AND SYMPTOMS Symptoms may include:   A lump. This most often appears on the hand or wrist, but it can occur in other areas of the body.  Tingling.  Pain.  Numbness.  Muscle weakness.  Weak grip.  Less movement in a joint. DIAGNOSIS Ganglion cysts are most often diagnosed based on a physical exam. Your health care provider will feel the lump and may shine a light alongside it. If it is a ganglion cyst, a light often shines through it. Your health care provider may order an X-ray, ultrasound, or MRI to rule out other conditions. TREATMENT Ganglion cysts usually go away on their own without treatment. If pain or other symptoms are involved, treatment may be needed. Treatment is also needed if the ganglion cyst limits your movement or if it gets infected. Treatment may include:  Wearing a brace or splint on your wrist or finger.  Taking anti-inflammatory medicine.  Draining fluid from  the lump with a needle (aspiration).  Injecting a steroid into the joint.  Surgery to remove the ganglion cyst. HOME CARE INSTRUCTIONS  Do not press on the ganglion cyst, poke it with a needle, or hit it.  Take medicines only as directed by your health care provider.  Wear your brace or splint as directed by your health care provider.  Watch your ganglion cyst for any changes.  Keep all follow-up visits as directed by your health care provider. This is important. SEEK MEDICAL CARE IF:  Your ganglion cyst becomes larger or more painful.  You have increased redness, red streaks, or swelling.  You have pus coming from the lump.  You have weakness or numbness in the affected area.  You have a fever or chills.   This information is not intended to replace advice given to you by your health care provider. Make sure you discuss any questions you have with your health care provider.   Document Released: 10/23/2000 Document Revised: 11/16/2014 Document Reviewed: 04/10/2014 Elsevier Interactive Patient Education [employer].

## 2016-02-06 DIAGNOSIS — M67471 Ganglion, right ankle and foot: Secondary | ICD-10-CM | POA: Diagnosis not present

## 2016-02-06 DIAGNOSIS — R609 Edema, unspecified: Secondary | ICD-10-CM | POA: Diagnosis not present

## 2016-02-06 MED ORDER — TRIAMCINOLONE ACETONIDE 10 MG/ML IJ SUSP
10.0000 mg | Freq: Once | INTRAMUSCULAR | Status: AC
  Administered 2016-02-06: 10 mg

## 2016-02-07 ENCOUNTER — Other Ambulatory Visit (INDEPENDENT_AMBULATORY_CARE_PROVIDER_SITE_OTHER): Payer: Medicare HMO | Admitting: *Deleted

## 2016-02-07 DIAGNOSIS — N183 Chronic kidney disease, stage 3 unspecified: Secondary | ICD-10-CM

## 2016-02-07 DIAGNOSIS — I10 Essential (primary) hypertension: Secondary | ICD-10-CM

## 2016-02-07 DIAGNOSIS — E785 Hyperlipidemia, unspecified: Secondary | ICD-10-CM | POA: Diagnosis not present

## 2016-02-07 DIAGNOSIS — I4891 Unspecified atrial fibrillation: Secondary | ICD-10-CM | POA: Diagnosis not present

## 2016-02-07 DIAGNOSIS — I480 Paroxysmal atrial fibrillation: Secondary | ICD-10-CM

## 2016-02-07 LAB — BASIC METABOLIC PANEL
BUN: 22 mg/dL (ref 7–25)
CHLORIDE: 104 mmol/L (ref 98–110)
CO2: 30 mmol/L (ref 20–31)
Calcium: 9.6 mg/dL (ref 8.6–10.4)
Creat: 1.36 mg/dL — ABNORMAL HIGH (ref 0.60–0.93)
Glucose, Bld: 83 mg/dL (ref 65–99)
POTASSIUM: 4.7 mmol/L (ref 3.5–5.3)
SODIUM: 142 mmol/L (ref 135–146)

## 2016-02-07 LAB — CBC WITH DIFFERENTIAL/PLATELET
BASOS ABS: 0.1 10*3/uL (ref 0.0–0.1)
Basophils Relative: 1 % (ref 0–1)
EOS PCT: 1 % (ref 0–5)
Eosinophils Absolute: 0.1 10*3/uL (ref 0.0–0.7)
HEMATOCRIT: 38.9 % (ref 36.0–46.0)
Hemoglobin: 13.5 g/dL (ref 12.0–15.0)
LYMPHS ABS: 1.3 10*3/uL (ref 0.7–4.0)
LYMPHS PCT: 23 % (ref 12–46)
MCH: 32.4 pg (ref 26.0–34.0)
MCHC: 34.7 g/dL (ref 30.0–36.0)
MCV: 93.3 fL (ref 78.0–100.0)
MPV: 11.3 fL (ref 8.6–12.4)
Monocytes Absolute: 0.5 10*3/uL (ref 0.1–1.0)
Monocytes Relative: 9 % (ref 3–12)
NEUTROS PCT: 66 % (ref 43–77)
Neutro Abs: 3.8 10*3/uL (ref 1.7–7.7)
PLATELETS: 216 10*3/uL (ref 150–400)
RBC: 4.17 MIL/uL (ref 3.87–5.11)
RDW: 13.9 % (ref 11.5–15.5)
WBC: 5.8 10*3/uL (ref 4.0–10.5)

## 2016-02-07 LAB — HEPATIC FUNCTION PANEL
ALBUMIN: 4.2 g/dL (ref 3.6–5.1)
ALK PHOS: 62 U/L (ref 33–130)
ALT: 12 U/L (ref 6–29)
AST: 16 U/L (ref 10–35)
BILIRUBIN TOTAL: 0.7 mg/dL (ref 0.2–1.2)
Bilirubin, Direct: 0.1 mg/dL (ref <=0.2)
Indirect Bilirubin: 0.6 mg/dL (ref 0.2–1.2)
TOTAL PROTEIN: 6.4 g/dL (ref 6.1–8.1)

## 2016-02-07 LAB — LIPID PANEL
CHOL/HDL RATIO: 2.2 ratio (ref <=5.0)
Cholesterol: 221 mg/dL — ABNORMAL HIGH (ref 125–200)
HDL: 99 mg/dL (ref >=46)
LDL Cholesterol: 108 mg/dL (ref <130)
TRIGLYCERIDES: 70 mg/dL (ref <150)
VLDL: 14 mg/dL (ref <30)

## 2016-02-10 ENCOUNTER — Telehealth: Payer: Self-pay | Admitting: *Deleted

## 2016-02-10 NOTE — Telephone Encounter (Signed)
Pt aware ok per Brynda Rim. PA not to start statin, however continue exercise and watch diet. Pt said thank you

## 2016-02-10 NOTE — Telephone Encounter (Signed)
Pt notified about lab results by phone with verbal understanding. I asked pt per Brynda Rim. PA would she be willing to start a statin to help reduce risk of CAD. Pt is not wanting to start statin therapy. I advised I will d/w PA for further recommendations

## 2016-03-06 DIAGNOSIS — Z1231 Encounter for screening mammogram for malignant neoplasm of breast: Secondary | ICD-10-CM | POA: Diagnosis not present

## 2016-03-10 ENCOUNTER — Encounter: Payer: Self-pay | Admitting: Internal Medicine

## 2016-03-18 ENCOUNTER — Encounter: Payer: Self-pay | Admitting: Podiatry

## 2016-03-18 ENCOUNTER — Ambulatory Visit (INDEPENDENT_AMBULATORY_CARE_PROVIDER_SITE_OTHER): Payer: Medicare HMO | Admitting: Podiatry

## 2016-03-18 VITALS — BP 129/89 | HR 69 | Resp 12

## 2016-03-18 DIAGNOSIS — M67471 Ganglion, right ankle and foot: Secondary | ICD-10-CM

## 2016-03-18 NOTE — Patient Instructions (Signed)
Today there was a soft tissue mass on the top he right foot that reduced after manual compression consistent with a ganglionic cyst. The cyst may recur. No active treatment indicated this time Also, your complaining of a tight knee in the arch at night. The cause of this could not be determined today. Try a fasciitis splint to wear on one foot at night to hold your foot perpendicular to your legs to see if this reduces the symptoms

## 2016-03-18 NOTE — Progress Notes (Signed)
   Subjective:    Patient ID: Carolyn Wells, female    DOB: Jul 12, 1941, 75 y.o.   MRN: [id number]  HPI   This patient presents today from follow-up visit of 02/05/2016. At that visit a Kenalog Injection and needling was performed on a soft tissue mass on the dorsal aspect of the right foot. No fluid was aspirated at that time and the lesion did not significantly reduce at that time. She said that the lesion   reduce in size after the injection, however, she is noted a recurrence of the lesion in or around the dorsal right second MPJ area. Also patient complains of intermittent pain only at night that causes some sleep disturbance which she describes happening since the visit of 02/05/2016   Review of Systems  Skin: Positive for color change.       Objective:   Physical Exam  Orientated 3  Vascular: Area of localized edema dorsal first MPJ area measuring 7 x 3.5 cm. DP and PT pulses 2/4 bilaterally Capillary reflex immediate bilaterally  Neurological: Sensation to 10 g monofilament wire intact 5/5 bilaterally Vibratory sensation reactive bilaterally Ankle reflex equal and reactive bilaterally  Dermatological: No open skin lesions bilaterally Palpable soft tissue mass 3.0 cm in diameter dorsal second MPJ. The lesion was manually compressed and ruptures. The lesion is not adherent to the overlying skin  Musculoskeletal: Bilateral HAV's Patient able to dorsi flex, plantarflex, invert, leave her 5/5 bilaterally There is no obvious attachment of this soft tissue lesion overlying the first metatarsal to the overlying tendons       Assessment & Plan:   Assessment: Ganglionic cyst dorsal right second MPJ that was manually ruptured Undetermined cause of nighttime pain  Plan: Today I advised patient that the lesion on the right foot easily ruptured with manual compression. If the lesion recurs I advised patient to manually compress the lesion and return for evaluation if  he did not manually reduce in size. Also, advised patient to observe her nighttime pain and if symptoms increased over time to present for further evaluation

## 2016-03-24 ENCOUNTER — Encounter: Payer: Self-pay | Admitting: Cardiovascular Disease

## 2016-03-24 ENCOUNTER — Ambulatory Visit (INDEPENDENT_AMBULATORY_CARE_PROVIDER_SITE_OTHER): Payer: Medicare HMO | Admitting: Cardiovascular Disease

## 2016-03-24 ENCOUNTER — Other Ambulatory Visit: Payer: Self-pay | Admitting: Internal Medicine

## 2016-03-24 VITALS — BP 144/90 | HR 78 | Ht 67.0 in | Wt 148.0 lb

## 2016-03-24 DIAGNOSIS — I4891 Unspecified atrial fibrillation: Secondary | ICD-10-CM | POA: Diagnosis not present

## 2016-03-24 NOTE — Patient Instructions (Signed)
Medication Instructions:  Your physician recommends that you continue on your current medications as directed. Please refer to the Current Medication list given to you today.   Labwork: None Ordered   Testing/Procedures: None Ordered   Follow-Up: Your physician wants you to follow-up in: 6 months with Dr. Nahser.  You will receive a reminder letter in the mail two months in advance. If you don't receive a letter, please call our office to schedule the follow-up appointment.   If you need a refill on your cardiac medications before your next appointment, please call your pharmacy.   Thank you for choosing [facility]! Keni Wafer, RN [phone]    

## 2016-03-24 NOTE — Progress Notes (Signed)
Cardiology Office Note:    Date:  03/24/2016   ID:  Carolyn Wells, DOB January 06, 1941, MRN [id number]  PCP:  Cathlean Cower, MD  Cardiologist:  New - Dr. Liam Rogers   Electrophysiologist:  n/a  Referring MD:  Dr. Scarlette Shorts  Chief Complaint  Patient presents with  . Follow-up    atrial fib    History of Present Illness:     Carolyn Wells is a 75 y.o. female with a hx of HTN, HL, CKD stage 3 (solitary kidney).  I saw her last week after she was noted to be in AF with RVR when she presented for routine colonoscopy. I saw her with Dr. Acie Fredrickson. We placed her on Xarelto, metoprolol tartrate and brought her back today for follow-up. She is doing well. She denies chest pain, shortness of breath, syncope, palpitations, orthopnea, PND or edema. Blood pressures at home have been optimal.  CHADS2-VASc=4 (female, age 58, HTN).  This patients CHA2DS2-VASc Score and unadjusted Ischemic Stroke Rate (% per year) is equal to 4.8 % stroke rate/year from a score of 4 Above score calculated as 1 point each if present [CHF, HTN, DM, Vascular=MI/PAD/Aortic Plaque, Age if 65-74, or Female] Above score calculated as 2 points each if present [Age > 75, or Stroke/TIA/TE]  Mar 24, 2016:  Feels very well. No symptoms.     still active.   Exercises regularly  Checks BP regularly ,  Her reading are all very good.  Is on metoprolol for rate control and mild HTN.       Past Medical History  Diagnosis Date  . HYPERLIPIDEMIA 09/27/2010    not on Rx therapy  . HYPERTENSION 08/06/2007  . BRADYCARDIA 09/26/2010  . INTERNAL HEMORRHOIDS 08/04/2007  . GASTROESOPHAGEAL REFLUX DISEASE 08/04/2007  . ABDOMINAL INCISIONAL HERNIA 08/13/2009    s/p repair  . DIVERTICULOSIS, COLON 08/25/2007  . LOW BACK PAIN 08/06/2007    scoliosis; DJD; gets injections  . OSTEOPENIA 08/04/2007  . COLONIC POLYPS, HX OF 08/25/2007  . Solitary kidney, acquired 09/28/2011    donated R kidney to husband  . Allergic rhinitis, cause  unspecified 09/28/2011  . CKD (chronic kidney disease), stage III 02/26/2015    Solitary kidney  . Hypothyroidism   . PAF (paroxysmal atrial fibrillation) ([place])     a.  Echo 2/17:  Vigorous LVF, EF 65-70%, no RWMA, Ao sclerosis, trivial AI, trivial MR, trivial TR, PASP 23 mmHg    Past Surgical History  Procedure Laterality Date  . Nephrectomy      Right  . S/o incisional hernia repair      RUQ  . Laminectomy  1999    L4-5/lumbar  . Tonsillectomy      Current Medications: Outpatient Prescriptions Prior to Visit  Medication Sig Dispense Refill  . Ascorbic Acid (VITAMIN C) 1000 MG tablet Take 1,000 mg by mouth daily.    . Calcium Citrate (CITRACAL PO) Take 2 tablets by mouth daily.     . diclofenac sodium (VOLTAREN) 1 % GEL Apply 1 application topically daily as needed (FOR BACK PAIN).     . Garlic (GARLIQUE PO) Take 1 tablet by mouth daily.     [name] levothyroxine (SYNTHROID, LEVOTHROID) 25 MCG tablet TAKE ONE TABLET BY MOUTH ONCE DAILY 90 tablet 0  . LUMIGAN 0.01 % SOLN Place 1 drop into both eyes at bedtime.     . metoprolol tartrate (LOPRESSOR) 25 MG tablet Take 1 tablet (25 mg total) by mouth 2 (two) times  daily. 60 tablet 6  . Omega-3 Fatty Acids (FISH OIL TRIPLE STRENGTH) 1400 MG CAPS Take 1 capsule by mouth daily. Fish Oil 1000 mg daily    . Omega-3 Krill Oil 300 MG CAPS Take 1 capsule by mouth.    . pantoprazole (PROTONIX) 40 MG tablet TAKE ONE TABLET BY MOUTH ONCE DAILY 90 tablet 0  . Plant Sterols and Stanols (CHOLEST OFF PO) Take 900 mg by mouth daily.    . Rivaroxaban (XARELTO) 15 MG TABS tablet Take 1 tablet (15 mg total) by mouth daily with supper. 30 tablet 6  . TURMERIC PO Take 2 tablets by mouth once  Daily Patient not sure of dosage.     No facility-administered medications prior to visit.     Allergies:   Hydrocodone   Social History   Social History  . Marital Status: Married    Spouse Name: [name]  . Number of Children: 2  . Years of Education: [name]    Occupational History  . retired [employer]    Social History Main Topics  . Smoking status: Never Smoker   . Smokeless tobacco: Never Used  . Alcohol Use: No  . Drug Use: No  . Sexual Activity: Not Asked   Other Topics Concern  . None   Social History Narrative   Husband disabled, renal failure, DM, HTN.   Originally from [place], [place] >> came to [place] 1966 (husband in [employer])        Family History:  The patient's family history includes Alzheimer's disease in her brother; Coronary artery disease (age of onset: 32) in her mother; Diabetes in her mother and other; Heart attack (age of onset: 96) in her father.   ROS:   Please see the history of present illness.    ROSAll other systems reviewed and are negative.   Physical Exam:    VS:  BP 144/90 mmHg  Pulse 78  Ht 5\' 7"  (1.702 m)  Wt 148 lb (67.132 kg)  BMI 23.17 kg/m2   GEN: Well nourished, well developed, in no acute distress HEENT: normal Neck: no JVD, no masses Cardiac: jirreg. Irreg. Normal S1/S2   no murmurs,   no edema    Respiratory:  clear to auscultation bilaterally; no wheezing, rhonchi or rales GI: soft, nontender  MS: no deformity or atrophy Skin: warm and dry  Neuro:  no focal deficits  Psych: Alert and oriented x 3, normal affect  Wt Readings from Last 3 Encounters:  03/24/16 148 lb (67.132 kg)  12/26/15 145 lb (65.772 kg)  12/17/15 135 lb 12.8 oz (61.598 kg)      Studies/Labs Reviewed:     EKG:  EKG is  ordered today.  The ekg ordered today demonstrates sinus brady, HR 55, normal axis, septal Q waves, no ST changes, QTc 397 ms  Recent Labs: 12/17/2015: TSH 3.74 02/07/2016: ALT 12; BUN 22; Creat 1.36*; Hemoglobin 13.5; Platelets 216; Potassium 4.7; Sodium 142   Recent Lipid Panel    Component Value Date/Time   CHOL 221* 02/07/2016 0942   TRIG 70 02/07/2016 0942   HDL 99 02/07/2016 0942   CHOLHDL 2.2 02/07/2016 0942   VLDL 14 02/07/2016 0942   LDLCALC 108 02/07/2016 0942    LDLDIRECT 117.7 09/26/2013 1420    Additional studies/ records that were reviewed today include:   None      Assessment / PLAN:     In order of problems listed above:  1. PAF - she is back  in atrial fib.  Is completely asymptomatic. At this point  2. HTN - BP controlled.   3. CKD - She has a solitary kidney due to transplant donation to her husband years ago.  Check BMET in 6 weeks.  5. Hyperlipidemia:   LDL = 108.  HDL = 99 Tot. Chol = 221 Trigs = 70     [name], MD  03/24/2016 2:10 PM    [facility] [place], [place], [place]  [address] Phone: [phone]; Fax: [phone]

## 2016-04-15 DIAGNOSIS — Z01419 Encounter for gynecological examination (general) (routine) without abnormal findings: Secondary | ICD-10-CM | POA: Diagnosis not present

## 2016-04-15 DIAGNOSIS — Z124 Encounter for screening for malignant neoplasm of cervix: Secondary | ICD-10-CM | POA: Diagnosis not present

## 2016-04-15 DIAGNOSIS — Z6824 Body mass index (BMI) 24.0-24.9, adult: Secondary | ICD-10-CM | POA: Diagnosis not present

## 2016-04-29 ENCOUNTER — Telehealth: Payer: Self-pay

## 2016-04-29 NOTE — Telephone Encounter (Signed)
Call to Ms. Jain and agreed to come in Monday  (6/26) for AWV at 11am

## 2016-05-01 ENCOUNTER — Other Ambulatory Visit: Payer: Self-pay | Admitting: Internal Medicine

## 2016-05-04 ENCOUNTER — Ambulatory Visit (INDEPENDENT_AMBULATORY_CARE_PROVIDER_SITE_OTHER): Payer: Medicare HMO

## 2016-05-04 VITALS — BP 134/94 | HR 70 | Ht 66.5 in | Wt 148.5 lb

## 2016-05-04 DIAGNOSIS — Z0000 Encounter for general adult medical examination without abnormal findings: Secondary | ICD-10-CM

## 2016-05-04 NOTE — Progress Notes (Addendum)
Subjective:   Carolyn Wells is a 75 y.o. female who presents for Medicare Annual (Subsequent) preventive examination.  Review of Systems:   HRA assessment completed during this visit with Carolyn Wells  The Patient was informed that the wellness visit is to identify their future health risk and educate and initiate measures that can reduce risk for increased disease through the lifespan.    NO ROS; Medicare Wellness Visit Last OV:  cardiology on 5/16; seen Carolyn Wells Oct 2016;  Labs completed: 01/2016  Health hasn't changed; back still hurts and has to sit down frequently  Seeing cardiology; since atrial fib dx prior to a scheduled colonoscopy  Lifestyle review and risk:  HTN; still exercises with back pain.   Psychosocial: Family Hx mother had DM after 99yo; Father had MI at 90; Dtr lives in [place] and dtr checks on her Son lives in [place];  [place] spouse kidney; cognizant of meds and kidney function Social history: lives alone; spouse died x 32yo; 3 cats; Has a friend she will assist in the community   Tobacco: no  How many drinks do you have per week? 0  Medications; Reviewed with no issues   BMI: 23   Diet; verity; fish, chicken; doesn't eat beef;  Fruits, salads, vegetables; Protein shake in am; cereal with blueberries; omelet/Lunch; out to eat; sandwich; pimento cheese; [place];  [place] to eat; tries to eat more fruit;   Teeth or Denture issues? Biannually / Dr. in [place];  Has double vision; wears prisms glasses; may need stronger prisms on occasion; asked about surgery and given information on line to review prior to seeing her doctor next visit   Exercise;   Rides her bike 3 miles per day; works in the garden; keep a working routine/ takes about 32min  Was in PT group and learned exercises; Has a yoga mat and does stretches; No exercise facility near her but will call [place] [facility] to see if isolated classes may exist; [place] seems to  far.     Manages back pain by stopping when her back hurts; Uses ice and lies down until it is better;  Does have a cart on wheels to help with getting groceries in as lifting does increase pain. Address of [place];  Address: [address], [place], [place] [address] Phone: [phone]  HOME SAFETY; same home; plans to age in place;  It is one level Fall hx; no Fall risk: Fear of falling? no Given education on "Fall Prevention in the Home" for more safety tips the patient can apply as appropriate.   Safety features reviewed for safe community; firearms if in the home; smoke alarms; sun protection when outside; driving difficulties or accidents; no  Stressors (1-5) stress on occasion/ 3 cats keep her Hall:  Any emotional problems? Anxious, depressed, irritable, sad or blue? no Denies feeling depressed or hopeless; voices pleasure in daily life/no How many social activities have you been engaged in within the last 2 weeks? no Who would help you with chores; illness; shopping other? Does have access to help - has a very good neighbor she can call anytime  Pain: in back; will go take injections on occasion  Cognitive; No issues; states "my elevator goes to the top". No failures or issues  Manages checkbook, medications; no failures of task Ad8 score reviewed for issues;  Issues making decisions; no  Less interest in hobbies / activities" no  Repeats questions, stories; family  complaining: NO  Trouble using ordinary gadgets; microwave; computer: no  Forgets the month or year: no  Mismanaging finances: no  Missing apt: no but does write them down  Daily problems with thinking of memory NO Ad8 score is 0   Any dizziness when standing up? No   Mobilization and Functional losses from last year to this year? no  Sleep pattern changes; not really; hard after spouse expired  Did take xanax, but quit Xanax about a year ago.   Urinary or fecal  incontinence reviewed: no   Counseling Health Maintenance Gaps:  Colonoscopy; 06/2007/ can order cologuard at next physical with Carolyn Wells, Will discuss when she sees him in Sept.  Declines colonoscopy  EKG: 12/2015 Mammogram: 02/2016; neg Dexa/ 03/2012 -1.5 at forearm followed by Dr. Neal/ GYN and will order  PAP: just had repeat pap.   Hearing: Right ear 2000 Left ear 4000  Ophthalmology exam Vision: bi annual / double vision controlled by prisms [email or web address]  Immunizations Due: none due  Advanced Directive addressed; no but states she has spoken with her dtr and the children know what she wants. Declines to complete any forms but states she has all of her affairs in order.   Individual Goal: To maintain her health; keep active   Health Recommendations and Referrals To fup on exercise in the area or at [place];   Barriers to Success [place]   Current Care Team reviewed and updated Carolyn Wells,  [facility] Cardiology Carolyn Wells; Podiatry Carolyn Wells, cardiology   Education provided and lifestyle risk discussed   All Health Maintenance Gaps Reviewed for closure   Cardiac Risk Factors include: advanced age (>95men, >4 women);dyslipidemia;hypertension     Objective:     Vitals: BP 134/94 mmHg  Pulse 70  Ht 5' 6.5" (1.689 m)  Wt 148 lb 8 oz (67.359 kg)  BMI 23.61 kg/m2  SpO2 97%  Body mass index is 23.61 kg/(m^2).   Tobacco History  Smoking status  . Never Smoker   Smokeless tobacco  . Never Used     Counseling given: Yes   Past Medical History  Diagnosis Date  . HYPERLIPIDEMIA 09/27/2010    not on Rx therapy  . HYPERTENSION 08/06/2007  . BRADYCARDIA 09/26/2010  . INTERNAL HEMORRHOIDS 08/04/2007  . GASTROESOPHAGEAL REFLUX DISEASE 08/04/2007  . ABDOMINAL INCISIONAL HERNIA 08/13/2009    s/p repair  . DIVERTICULOSIS, COLON 08/25/2007  . LOW BACK PAIN 08/06/2007    scoliosis; DJD; gets injections    . OSTEOPENIA 08/04/2007  . COLONIC POLYPS, HX OF 08/25/2007  . Solitary kidney, acquired 09/28/2011    donated R kidney to husband  . Allergic rhinitis, cause unspecified 09/28/2011  . CKD (chronic kidney disease), stage III 02/26/2015    Solitary kidney  . Hypothyroidism   . PAF (paroxysmal atrial fibrillation) ([place])     a.  Echo 2/17:  Vigorous LVF, EF 65-70%, no RWMA, Ao sclerosis, trivial AI, trivial MR, trivial TR, PASP 23 mmHg   Past Surgical History  Procedure Laterality Date  . Nephrectomy      Right  . S/o incisional hernia repair      RUQ  . Laminectomy  1999    L4-5/lumbar  . Tonsillectomy     Family History  Problem Relation Age of Onset  . Coronary artery disease Mother 29    1st degree relative   . Diabetes Other     1st degree relative  . Diabetes Mother   .  Heart attack Father 87  . Alzheimer's disease Brother    History  Sexual Activity  . Sexual Activity: Not on file    Outpatient Encounter Prescriptions as of 05/04/2016  Medication Sig  . Ascorbic Acid (VITAMIN C) 1000 MG tablet Take 1,000 mg by mouth daily.  . Calcium Citrate (CITRACAL PO) Take 2 tablets by mouth daily.   . diclofenac sodium (VOLTAREN) 1 % GEL Apply 1 application topically daily as needed (FOR BACK PAIN).   . Garlic (GARLIQUE PO) Take 1 tablet by mouth daily.   [name] levothyroxine (SYNTHROID, LEVOTHROID) 25 MCG tablet TAKE ONE TABLET BY MOUTH ONCE DAILY  . LUMIGAN 0.01 % SOLN Place 1 drop into both eyes at bedtime.   . metoprolol tartrate (LOPRESSOR) 25 MG tablet Take 1 tablet (25 mg total) by mouth 2 (two) times daily.  . Omega-3 Fatty Acids (FISH OIL TRIPLE STRENGTH) 1400 MG CAPS Take 1 capsule by mouth daily. Fish Oil 1000 mg daily  . Omega-3 Krill Oil 300 MG CAPS Take 1 capsule by mouth.  . pantoprazole (PROTONIX) 40 MG tablet TAKE ONE TABLET BY MOUTH ONCE DAILY  . Plant Sterols and Stanols (CHOLEST OFF PO) Take 900 mg by mouth daily.  . Red Yeast Rice Extract (RED YEAST RICE PO)  Take by mouth.  . Rivaroxaban (XARELTO) 15 MG TABS tablet Take 1 tablet (15 mg total) by mouth daily with supper.  . TURMERIC PO Take 2 tablets by mouth once  Daily Patient not sure of dosage.  . [DISCONTINUED] pantoprazole (PROTONIX) 40 MG tablet TAKE ONE TABLET BY MOUTH ONCE DAILY   No facility-administered encounter medications on file as of 05/04/2016.    Activities of Daily Living In your present state of health, do you have any difficulty performing the following activities: 05/04/2016 05/06/2015  Hearing? N N  Vision? N N  Difficulty concentrating or making decisions? N N  Walking or climbing stairs? N -  Dressing or bathing? N N  Doing errands, shopping? N N  Preparing Food and eating ? N N  Using the Toilet? N N  In the past six months, have you accidently leaked urine? N N  Do you have problems with loss of bowel control? N N  Managing your Medications? N N  Managing your Finances? N N  Housekeeping or managing your Housekeeping? N N    Patient Care Team: Biagio Borg, MD as PCP - General    Assessment:     Exercise Activities and Dietary recommendations Current Exercise Habits: Home exercise routine, Time (Minutes): 30, Frequency (Times/Week): 6, Weekly Exercise (Minutes/Week): 180, Intensity: Moderate  Goals    . Exercise 3x per week (30 min per time)     Would like to start back exercises; will try pilates     . patient     Just to maintain; keep exercising and keep taking care of yourself  Also helps another Sr. Citizen;       Fall Risk Fall Risk  05/04/2016 08/28/2015 05/06/2015  Falls in the past year? No No Yes  Number falls in past yr: - - 1  Injury with Fall? - - No  Follow up - - Education provided   Depression Screen PHQ 2/9 Scores 05/04/2016 08/28/2015 05/06/2015  PHQ - 2 Score 0 1 0     Cognitive Testing MMSE - Mini Mental State Exam 05/04/2016  Not completed: (No Data)    Immunization History  Administered Date(s) Administered  .  Influenza Split 09/28/2011, 09/28/2012  .  Influenza Whole 09/10/2006, 08/25/2007, 08/30/2008, 08/13/2009, 09/26/2010  . Influenza, Seasonal, Injecte, Preservative Fre 09/01/2013  . Influenza,inj,Quad PF,36+ Mos 08/28/2015  . Pneumococcal Conjugate-13 09/28/2013  . Pneumococcal Polysaccharide-23 08/09/2005, 08/10/2005, 03/27/2011  . Td 11/09/1989, 08/13/2009  . Zoster 07/28/2006   Screening Tests Health Maintenance  Topic Date Due  . COLONOSCOPY  07/04/2010  . INFLUENZA VACCINE  06/09/2016  . TETANUS/TDAP  08/14/2019  . DEXA SCAN  Completed  . ZOSTAVAX  Completed  . PNA vac Low Risk Adult  Completed      Plan:   Educated again on low cholesterol; Given information on targets for lipids.  Given information on the strabismus surgery if this is an option to discuss with her doctor if due to muscle issue. Glasses are helping   During the course of the visit the patient was educated and counseled about the following appropriate screening and preventive services:   Vaccines to include Pneumoccal, Influenza, Hepatitis B, Td, Zostavax, HCV/ not due  Electrocardiogram 10/2016  Cardiovascular Disease/ BP; to monitor 2 to 3 times a week   Colorectal cancer screening/ declines; will discuss cologuard at the next visit   Bone density screening; deferred to GYN Dr. Nori Riis  Diabetes screening/ Glucose 83  Glaucoma screening/ neg  Mammography/PAP/ April 2017 neg  Nutrition counseling / eats well; keeps a close eye on her weight    Patient Instructions (the written plan) was given to the patient.   Wynetta Fines, RN  05/04/2016   Medical screening examination/treatment/procedure(s) were performed by non-physician practitioner and as supervising provider I was immediately available for consultation/collaboration. I agree with above. Mauricio Po, FNP

## 2016-05-04 NOTE — Patient Instructions (Addendum)
Carolyn Wells , Thank you for taking time to come for your Medicare Wellness Visit. I appreciate your ongoing commitment to your health goals. Please review the following plan we discussed and let me know if I can assist you in the future.   [email or web address]  What is the "Mediterranean" diet?  There's no one "Mediterranean" diet. At least 16 countries border the [employer]. Diets vary between these countries and also between regions within a country. Many differences in culture, ethnic background, religion, economy and agricultural production result in different diets. But the common Mediterranean dietary pattern has these characteristics: high consumption of fruits, vegetables, bread and other cereals, potatoes, beans, nuts and seeds  olive oil is an important monounsaturated fat source  dairy products, fish and poultry are consumed in low to moderate amounts, and little red meat is eaten  eggs are consumed zero to four times a week  wine is consumed in low to moderate amounts Does a Mediterranean-style diet follow American Heart Association dietary recommendations? Mediterranean-style diets are often close to our dietary recommendations, but they don't follow them exactly. In general, the diets of Mediterranean peoples contain a relatively high percentage of calories from fat. This is thought to contribute to the increasing obesity in these countries, which is becoming a concern. People who follow the average Mediterranean diet eat less saturated fat than those who eat the average American diet. In fact, saturated fat consumption is well within our dietary guidelines. More than half the fat calories in a Mediterranean diet come from monounsaturated fats (mainly from olive oil). Monounsaturated fat doesn't raise blood cholesterol levels the way saturated fat does. The incidence of heart disease in [place] countries is lower than in the [place]. Death rates are lower, too. But this may not be entirely due to the diet. Lifestyle factors (such as more physical activity and extended social support systems) may also play a part. Before advising people to follow a Mediterranean diet, we need more studies to find out whether the diet itself or other lifestyle factors account for the lower deaths from heart disease.  American Heart Association   These are the goals we discussed: Goals    . Exercise 3x per week (30 min per time)     Would like to start back exercises; will try pilates     . patient     Just to maintain; keep exercising and keep taking care of yourself  Also helps another [place] Citizen;        This is a list of the screening recommended for you and due dates:  Health Maintenance  Topic Date Due  . Colon Cancer Screening  07/04/2010  . Flu Shot  06/09/2016  . Tetanus Vaccine  08/14/2019  . DEXA scan (bone density measurement)  Completed  . Shingles Vaccine  Completed  . Pneumonia vaccines  Completed   Cholesterol Cholesterol is a white, waxy, fat-like substance needed by your body in small amounts. The liver makes all the cholesterol you need. Cholesterol is carried from the liver by the blood through the blood vessels. Deposits of cholesterol (plaque) may build up on blood vessel walls. These make the arteries narrower and stiffer. Cholesterol plaques increase the risk for heart attack and stroke.  You cannot feel your cholesterol level even if it is very high. The only way to know it is high is with a blood test. Once you know your cholesterol levels, you should keep a record of the test results. Work  with your health care provider to keep your levels in the desired range.  WHAT DO THE RESULTS MEAN?  Total cholesterol is a rough measure of all the cholesterol in your blood.   LDL is the so-called bad cholesterol. This is the type that deposits cholesterol in the walls of the arteries. You want this level to  be low.   HDL is the good cholesterol because it cleans the arteries and carries the LDL away. You want this level to be high.  Triglycerides are fat that the body can either burn for energy or store. High levels are closely linked to heart disease.  WHAT ARE THE DESIRED LEVELS OF CHOLESTEROL?  Total cholesterol below 200.   LDL below 100 for people at risk, below 70 for those at very high risk.   HDL above 50 is good, above 60 is best.   Triglycerides below 150.  HOW CAN I LOWER MY CHOLESTEROL?  Diet. Follow your diet programs as directed by your health care provider.   Choose fish or white meat chicken and [place], roasted or baked. Limit fatty cuts of red meat, fried foods, and processed meats, such as sausage and lunch meats.   Eat lots of fresh fruits and vegetables.  Choose whole grains, beans, pasta, potatoes, and cereals.   Use only small amounts of olive, corn, or canola oils.   Avoid butter, mayonnaise, shortening, or palm kernel oils.  Avoid foods with trans fats.   Drink skim or nonfat milk and eat low-fat or nonfat yogurt and cheeses. Avoid whole milk, cream, ice cream, egg yolks, and full-fat cheeses.   Healthy desserts include angel food cake, ginger snaps, animal crackers, hard candy, popsicles, and low-fat or nonfat frozen yogurt. Avoid pastries, cakes, pies, and cookies.   Exercise. Follow your exercise programs as directed by your health care provider.   A regular program helps decrease LDL and raise HDL.   A regular program helps with weight control.   Do things that increase your activity level like gardening, walking, or taking the stairs. Ask your health care provider about how you can be more active in your daily life.   Medicine. Take medicine only as directed by your health care provider.   Medicine may be prescribed by your health care provider to help lower cholesterol and decrease the risk for heart disease.   If you have  several risk factors, you may need medicine even if your levels are normal.   This information is not intended to replace advice given to you by your health care provider. Make sure you discuss any questions you have with your health care provider.   Document Released: 07/21/2001 Document Revised: 11/16/2014 Document Reviewed: 08/09/2013 Elsevier Interactive Patient Education 2016 [place] in the Home  Falls can cause injuries. They can happen to people of all ages. There are many things you can do to make your home safe and to help prevent falls.  WHAT CAN I DO ON THE OUTSIDE OF MY HOME?  Regularly fix the edges of walkways and driveways and fix any cracks.  Remove anything that might make you trip as you walk through a door, such as a raised step or threshold.  Trim any bushes or trees on the path to your home.  Use bright outdoor lighting.  Clear any walking paths of anything that might make someone trip, such as rocks or tools.  Regularly check to see if handrails are loose or broken.  Make sure that both sides of any steps have handrails.  Any raised decks and porches should have guardrails on the edges.  Have any leaves, snow, or ice cleared regularly.  Use sand or salt on walking paths during winter.  Clean up any spills in your garage right away. This includes oil or grease spills. WHAT CAN I DO IN THE BATHROOM?   Use night lights.  Install grab bars by the toilet and in the tub and shower. Do not use towel bars as grab bars.  Use non-skid mats or decals in the tub or shower.  If you need to sit down in the shower, use a plastic, non-slip stool.  Keep the floor dry. Clean up any water that spills on the floor as soon as it happens.  Remove soap buildup in the tub or shower regularly.  Attach bath mats securely with double-sided non-slip rug tape.  Do not have throw rugs and other things on the floor that can make you trip. WHAT CAN I DO  IN THE BEDROOM?  Use night lights.  Make sure that you have a light by your bed that is easy to reach.  Do not use any sheets or blankets that are too big for your bed. They should not hang down onto the floor.  Have a firm chair that has side arms. You can use this for support while you get dressed.  Do not have throw rugs and other things on the floor that can make you trip. WHAT CAN I DO IN THE KITCHEN?  Clean up any spills right away.  Avoid walking on wet floors.  Keep items that you use a lot in easy-to-reach places.  If you need to reach something above you, use a strong step stool that has a grab bar.  Keep electrical cords out of the way.  Do not use floor polish or wax that makes floors slippery. If you must use wax, use non-skid floor wax.  Do not have throw rugs and other things on the floor that can make you trip. WHAT CAN I DO WITH MY STAIRS?  Do not leave any items on the stairs.  Make sure that there are handrails on both sides of the stairs and use them. Fix handrails that are broken or loose. Make sure that handrails are as long as the stairways.  Check any carpeting to make sure that it is firmly attached to the stairs. Fix any carpet that is loose or worn.  Avoid having throw rugs at the top or bottom of the stairs. If you do have throw rugs, attach them to the floor with carpet tape.  Make sure that you have a light switch at the top of the stairs and the bottom of the stairs. If you do not have them, ask someone to add them for you. WHAT ELSE CAN I DO TO HELP PREVENT FALLS?  Wear shoes that:  Do not have high heels.  Have rubber bottoms.  Are comfortable and fit you well.  Are closed at the toe. Do not wear sandals.  If you use a stepladder:  Make sure that it is fully opened. Do not climb a closed stepladder.  Make sure that both sides of the stepladder are locked into place.  Ask someone to hold it for you, if possible.  Clearly mark  and make sure that you can see:  Any grab bars or handrails.  First and last steps.  Where the edge of each step  is.  Use tools that help you move around (mobility aids) if they are needed. These include:  Canes.  Walkers.  Scooters.  Crutches.  Turn on the lights when you go into a dark area. Replace any light bulbs as soon as they burn out.  Set up your furniture so you have a clear path. Avoid moving your furniture around.  If any of your floors are uneven, fix them.  If there are any pets around you, be aware of where they are.  Review your medicines with your doctor. Some medicines can make you feel dizzy. This can increase your chance of falling. Ask your doctor what other things that you can do to help prevent falls.   This information is not intended to replace advice given to you by your health care provider. Make sure you discuss any questions you have with your health care provider.   Document Released: 08/22/2009 Document Revised: 03/12/2015 Document Reviewed: 11/30/2014 Elsevier Interactive Patient Education 2016 [place] Maintenance, Female Adopting a healthy lifestyle and getting preventive care can go a long way to promote health and wellness. Talk with your health care provider about what schedule of regular examinations is right for you. This is a good chance for you to check in with your provider about disease prevention and staying healthy. In between checkups, there are plenty of things you can do on your own. Experts have done a lot of research about which lifestyle changes and preventive measures are most likely to keep you healthy. Ask your health care provider for more information. WEIGHT AND DIET  Eat a healthy diet  Be sure to include plenty of vegetables, fruits, low-fat dairy products, and lean protein.  Do not eat a lot of foods high in solid fats, added sugars, or salt.  Get regular exercise. This is one of the most important  things you can do for your health.  Most adults should exercise for at least 150 minutes each week. The exercise should increase your heart rate and make you sweat (moderate-intensity exercise).  Most adults should also do strengthening exercises at least twice a week. This is in addition to the moderate-intensity exercise.  Maintain a healthy weight  Body mass index (BMI) is a measurement that can be used to identify possible weight problems. It estimates body fat based on height and weight. Your health care provider can help determine your BMI and help you achieve or maintain a healthy weight.  For females 30 years of age and older:   A BMI below 18.5 is considered underweight.  A BMI of 18.5 to 24.9 is normal.  A BMI of 25 to 29.9 is considered overweight.  A BMI of 30 and above is considered obese.  Watch levels of cholesterol and blood lipids  You should start having your blood tested for lipids and cholesterol at 76 years of age, then have this test every 5 years.  You may need to have your cholesterol levels checked more often if:  Your lipid or cholesterol levels are high.  You are older than 76 years of age.  You are at high risk for heart disease.  CANCER SCREENING   Lung Cancer  Lung cancer screening is recommended for adults 20-83 years old who are at high risk for lung cancer because of a history of smoking.  A yearly low-dose CT scan of the lungs is recommended for people who:  Currently smoke.  Have quit within the past 15 years.  Have at least a 30-pack-year history of smoking. A pack year is smoking an average of one pack of cigarettes a day for 1 year.  Yearly screening should continue until it has been 15 years since you quit.  Yearly screening should stop if you develop a health problem that would prevent you from having lung cancer treatment.  Breast Cancer  Practice breast self-awareness. This means understanding how your breasts normally  appear and feel.  It also means doing regular breast self-exams. Let your health care provider know about any changes, no matter how small.  If you are in your 20s or 30s, you should have a clinical breast exam (CBE) by a health care provider every 1-3 years as part of a regular health exam.  If you are 70 or older, have a CBE every year. Also consider having a breast X-ray (mammogram) every year.  If you have a family history of breast cancer, talk to your health care provider about genetic screening.  If you are at high risk for breast cancer, talk to your health care provider about having an MRI and a mammogram every year.  Breast cancer gene (BRCA) assessment is recommended for women who have family members with BRCA-related cancers. BRCA-related cancers include:  Breast.  Ovarian.  Tubal.  Peritoneal cancers.  Results of the assessment will determine the need for genetic counseling and BRCA1 and BRCA2 testing. Cervical Cancer Your health care provider may recommend that you be screened regularly for cancer of the pelvic organs (ovaries, uterus, and vagina). This screening involves a pelvic examination, including checking for microscopic changes to the surface of your cervix (Pap test). You may be encouraged to have this screening done every 3 years, beginning at age 42.  For women ages 84-65, health care providers may recommend pelvic exams and Pap testing every 3 years, or they may recommend the Pap and pelvic exam, combined with testing for human papilloma virus (HPV), every 5 years. Some types of HPV increase your risk of cervical cancer. Testing for HPV may also be done on women of any age with unclear Pap test results.  Other health care providers may not recommend any screening for nonpregnant women who are considered low risk for pelvic cancer and who do not have symptoms. Ask your health care provider if a screening pelvic exam is right for you.  If you have had past  treatment for cervical cancer or a condition that could lead to cancer, you need Pap tests and screening for cancer for at least 20 years after your treatment. If Pap tests have been discontinued, your risk factors (such as having a new sexual partner) need to be reassessed to determine if screening should resume. Some women have medical problems that increase the chance of getting cervical cancer. In these cases, your health care provider may recommend more frequent screening and Pap tests. Colorectal Cancer  This type of cancer can be detected and often prevented.  Routine colorectal cancer screening usually begins at 76 years of age and continues through 76 years of age.  Your health care provider may recommend screening at an earlier age if you have risk factors for colon cancer.  Your health care provider may also recommend using home test kits to check for hidden blood in the stool.  A small camera at the end of a tube can be used to examine your colon directly (sigmoidoscopy or colonoscopy). This is done to check for the earliest forms of colorectal cancer.  Routine screening usually begins at age 26.  Direct examination of the colon should be repeated every 5-10 years through 76 years of age. However, you may need to be screened more often if early forms of precancerous polyps or small growths are found. Skin Cancer  Check your skin from head to toe regularly.  Tell your health care provider about any new moles or changes in moles, especially if there is a change in a mole's shape or color.  Also tell your health care provider if you have a mole that is larger than the size of a pencil eraser.  Always use sunscreen. Apply sunscreen liberally and repeatedly throughout the day.  Protect yourself by wearing long sleeves, pants, a wide-brimmed hat, and sunglasses whenever you are outside. HEART DISEASE, DIABETES, AND HIGH BLOOD PRESSURE   High blood pressure causes heart disease and  increases the risk of stroke. High blood pressure is more likely to develop in:  People who have blood pressure in the high end of the normal range (130-139/85-89 mm Hg).  People who are overweight or obese.  People who are African American.  If you are 58-41 years of age, have your blood pressure checked every 3-5 years. If you are 30 years of age or older, have your blood pressure checked every year. You should have your blood pressure measured twice--once when you are at a hospital or clinic, and once when you are not at a hospital or clinic. Record the average of the two measurements. To check your blood pressure when you are not at a hospital or clinic, you can use:  An automated blood pressure machine at a pharmacy.  A home blood pressure monitor.  If you are between 22 years and 42 years old, ask your health care provider if you should take aspirin to prevent strokes.  Have regular diabetes screenings. This involves taking a blood sample to check your fasting blood sugar level.  If you are at a normal weight and have a low risk for diabetes, have this test once every three years after 76 years of age.  If you are overweight and have a high risk for diabetes, consider being tested at a younger age or more often. PREVENTING INFECTION  Hepatitis B  If you have a higher risk for hepatitis B, you should be screened for this virus. You are considered at high risk for hepatitis B if:  You were born in a country where hepatitis B is common. Ask your health care provider which countries are considered high risk.  Your parents were born in a high-risk country, and you have not been immunized against hepatitis B (hepatitis B vaccine).  You have HIV or AIDS.  You use needles to inject street drugs.  You live with someone who has hepatitis B.  You have had sex with someone who has hepatitis B.  You get hemodialysis treatment.  You take certain medicines for conditions, including  cancer, organ transplantation, and autoimmune conditions. Hepatitis C  Blood testing is recommended for:  Everyone born from [birth year] through 1966.  Anyone with known risk factors for hepatitis C. Sexually transmitted infections (STIs)  You should be screened for sexually transmitted infections (STIs) including gonorrhea and chlamydia if:  You are sexually active and are younger than 76 years of age.  You are older than 76 years of age and your health care provider tells you that you are at risk for this type of infection.  Your sexual activity has changed  since you were last screened and you are at an increased risk for chlamydia or gonorrhea. Ask your health care provider if you are at risk.  If you do not have HIV, but are at risk, it may be recommended that you take a prescription medicine daily to prevent HIV infection. This is called pre-exposure prophylaxis (PrEP). You are considered at risk if:  You are sexually active and do not regularly use condoms or know the HIV status of your partner(s).  You take drugs by injection.  You are sexually active with a partner who has HIV. Talk with your health care provider about whether you are at high risk of being infected with HIV. If you choose to begin PrEP, you should first be tested for HIV. You should then be tested every 3 months for as long as you are taking PrEP.  PREGNANCY   If you are premenopausal and you may become pregnant, ask your health care provider about preconception counseling.  If you may become pregnant, take 400 to 800 micrograms (mcg) of folic acid every day.  If you want to prevent pregnancy, talk to your health care provider about birth control (contraception). OSTEOPOROSIS AND MENOPAUSE   Osteoporosis is a disease in which the bones lose minerals and strength with aging. This can result in serious bone fractures. Your risk for osteoporosis can be identified using a bone density scan.  If you are 28 years of  age or older, or if you are at risk for osteoporosis and fractures, ask your health care provider if you should be screened.  Ask your health care provider whether you should take a calcium or vitamin D supplement to lower your risk for osteoporosis.  Menopause may have certain physical symptoms and risks.  Hormone replacement therapy may reduce some of these symptoms and risks. Talk to your health care provider about whether hormone replacement therapy is right for you.  HOME CARE INSTRUCTIONS   Schedule regular health, dental, and eye exams.  Stay current with your immunizations.   Do not use any tobacco products including cigarettes, chewing tobacco, or electronic cigarettes.  If you are pregnant, do not drink alcohol.  If you are breastfeeding, limit how much and how often you drink alcohol.  Limit alcohol intake to no more than 1 drink per day for nonpregnant women. One drink equals 12 ounces of beer, 5 ounces of wine, or 1 ounces of hard liquor.  Do not use street drugs.  Do not share needles.  Ask your health care provider for help if you need support or information about quitting drugs.  Tell your health care provider if you often feel depressed.  Tell your health care provider if you have ever been abused or do not feel safe at home.   This information is not intended to replace advice given to you by your health care provider. Make sure you discuss any questions you have with your health care provider.   Document Released: 05/11/2011 Document Revised: 11/16/2014 Document Reviewed: 09/27/2013 Elsevier Interactive Patient Education [employer].

## 2016-06-27 ENCOUNTER — Other Ambulatory Visit: Payer: Self-pay | Admitting: Internal Medicine

## 2016-07-08 ENCOUNTER — Encounter: Payer: Self-pay | Admitting: Nurse Practitioner

## 2016-07-08 ENCOUNTER — Ambulatory Visit (INDEPENDENT_AMBULATORY_CARE_PROVIDER_SITE_OTHER): Payer: Medicare HMO | Admitting: Nurse Practitioner

## 2016-07-08 VITALS — BP 136/84 | HR 81 | Temp 97.8°F | Ht 67.0 in | Wt 152.0 lb

## 2016-07-08 DIAGNOSIS — S161XXA Strain of muscle, fascia and tendon at neck level, initial encounter: Secondary | ICD-10-CM

## 2016-07-08 DIAGNOSIS — S46811A Strain of other muscles, fascia and tendons at shoulder and upper arm level, right arm, initial encounter: Secondary | ICD-10-CM

## 2016-07-08 MED ORDER — LIDOCAINE HCL (PF) 1 % IJ SOLN
5.0000 mL | Freq: Once | INTRAMUSCULAR | Status: DC
  Administered 2016-07-08: 5 mL

## 2016-07-08 MED ORDER — METHYLPREDNISOLONE 4 MG PO TBPK: ORAL_TABLET | ORAL | 0 refills | Status: DC

## 2016-07-08 NOTE — Progress Notes (Signed)
Pre visit review using our clinic review tool, if applicable. No additional management support is needed unless otherwise documented below in the visit note. 

## 2016-07-08 NOTE — Addendum Note (Signed)
Addended by: Lyman Bishop on: 07/08/2016 03:03 PM   Modules accepted: Orders

## 2016-07-08 NOTE — Progress Notes (Signed)
Subjective:     Carolyn Wells is a 75 y.o. female who presents for evaluation of right side neck pain. Event that precipitated these symptoms: twisting injury while shampooing hair on friday. Onset of symptoms was 5 days ago, and have been gradually worsening since that time. Current symptoms are pain with neck turn to right, no paresthesia,no weakness. Patient denies numbness in none, paresthesias in none, stiffness in none and weakness in none. Patient has had recurrent self limited episodes of neck pain in the past. Previous treatments: medication: analgesic: tramadol, muscle relaxant: flexeril with no relief  The following portions of the patient's history were reviewed and updated as appropriate: past family history and past social history, medications, allergies and problem list.  Review of Systems Constitutional: negative except for right upper shoulder pain (posterior)  and right side neck pain.   Objective:    BP 136/84   Pulse 81   Temp 97.8 F (36.6 C) (Oral)   Ht 5\' 7"  (1.702 m)   Wt 152 lb (68.9 kg)   SpO2 96%   BMI 23.81 kg/m  General:   alert, cooperative, appears stated age and mild distress  External Deformity:  absent  ROM Cervical Spine:  limited right rotation and left lateral flexion  Midline Tenderness:   none  Paraspinous tenderness:  moderate on the right  UE Neurologic Exam:  normal strength, normal sensation, normal reflexes   X-ray of the cervical spine: Not indicated   Procedure Note:   Trigger Point Injections   Indication: Right trapezius muscle spasm.   Risks including unsuccessful procedure , bleeding, infection, bruising and others were explained to the patient in detail as well as the benefits. Verbal consent was obtained.  Patient sitting on exam table with right shoulder exposed. Skin of right upper trapezius muscle cleaned with alcohol swabs. Administered 71ml of lidocaine 1% intramuscular x 4.   Tolerated well. Complications: None.    Postprocedure instructions :   A Band-Aid should be left on for 12 hours. Injection therapy is not a cure itself. It is used in conjunction with other modalities. You can use tylenol or tramadol for pain relief , hot and cold compresses. Rest is recommended in the next 24 hours. You need to report immediately  if fever, chills or any signs of infection develop.    Assessment:    Cervical strain   Right trapezius muscle Strain  Plan:  Trigger point injection done today.   Rx: Medrol dose pack also prescribed. Uanble to use NSAIDs due to current use of Xarelto.  Discussed the cervical pain, its course and treatment. Neurosurgeon distributed. Discussed appropriate use of ice and heat. OTC analgesics as needed. Oral opioids continued per medication orders. Muscle relaxants continued per medication orders. F/up prn

## 2016-07-08 NOTE — Patient Instructions (Signed)
Cervical Strain and Sprain With Rehab Cervical strain and sprain are injuries that commonly occur with "whiplash" injuries. Whiplash occurs when the neck is forcefully whipped backward or forward, such as during a motor vehicle accident or during contact sports. The muscles, ligaments, tendons, discs, and nerves of the neck are susceptible to injury when this occurs. RISK FACTORS Risk of having a whiplash injury increases if:  Osteoarthritis of the spine.  Situations that make head or neck accidents or trauma more likely.  High-risk sports (football, rugby, wrestling, hockey, auto racing, gymnastics, diving, contact karate, or boxing).  Poor strength and flexibility of the neck.  Previous neck injury.  Poor tackling technique.  Improperly fitted or padded equipment. SYMPTOMS   Pain or stiffness in the front or back of neck or both.  Symptoms may present immediately or up to 24 hours after injury.  Dizziness, headache, nausea, and vomiting.  Muscle spasm with soreness and stiffness in the neck.  Tenderness and swelling at the injury site. PREVENTION  Learn and use proper technique (avoid tackling with the head, spearing, and head-butting; use proper falling techniques to avoid landing on the head).  Warm up and stretch properly before activity.  Maintain physical fitness:  Strength, flexibility, and endurance.  Cardiovascular fitness.  Wear properly fitted and padded protective equipment, such as padded soft collars, for participation in contact sports. PROGNOSIS  Recovery from cervical strain and sprain injuries is dependent on the extent of the injury. These injuries are usually curable in 1 week to 3 months with appropriate treatment.  RELATED COMPLICATIONS   Temporary numbness and weakness may occur if the nerve roots are damaged, and this may persist until the nerve has completely healed.  Chronic pain due to frequent recurrence of symptoms.  Prolonged healing,  especially if activity is resumed too soon (before complete recovery). TREATMENT  Treatment initially involves the use of ice and medication to help reduce pain and inflammation. It is also important to perform strengthening and stretching exercises and modify activities that worsen symptoms so the injury does not get worse. These exercises may be performed at home or with a therapist. For patients who experience severe symptoms, a soft, padded collar may be recommended to be worn around the neck.  Improving your posture may help reduce symptoms. Posture improvement includes pulling your chin and abdomen in while sitting or standing. If you are sitting, sit in a firm chair with your buttocks against the back of the chair. While sleeping, try replacing your pillow with a small towel rolled to 2 inches in diameter, or use a cervical pillow or soft cervical collar. Poor sleeping positions delay healing.  For patients with nerve root damage, which causes numbness or weakness, the use of a cervical traction apparatus may be recommended. Surgery is rarely necessary for these injuries. However, cervical strain and sprains that are present at birth (congenital) may require surgery. MEDICATION   If pain medication is necessary, nonsteroidal anti-inflammatory medications, such as aspirin and ibuprofen, or other minor pain relievers, such as acetaminophen, are often recommended.  Do not take pain medication for 7 days before surgery.  Prescription pain relievers may be given if deemed necessary by your caregiver. Use only as directed and only as much as you need. HEAT AND COLD:   Cold treatment (icing) relieves pain and reduces inflammation. Cold treatment should be applied for 10 to 15 minutes every 2 to 3 hours for inflammation and pain and immediately after any activity that aggravates your  symptoms. Use ice packs or an ice massage.  Heat treatment may be used prior to performing the stretching and  strengthening activities prescribed by your caregiver, physical therapist, or athletic trainer. Use a heat pack or a warm soak. SEEK MEDICAL CARE IF:   Symptoms get worse or do not improve in 2 weeks despite treatment.  New, unexplained symptoms develop (drugs used in treatment may produce side effects). EXERCISES RANGE OF MOTION (ROM) AND STRETCHING EXERCISES - Cervical Strain and Sprain These exercises may help you when beginning to rehabilitate your injury. In order to successfully resolve your symptoms, you must improve your posture. These exercises are designed to help reduce the forward-head and rounded-shoulder posture which contributes to this condition. Your symptoms may resolve with or without further involvement from your physician, physical therapist or athletic trainer. While completing these exercises, remember:   Restoring tissue flexibility helps normal motion to return to the joints. This allows healthier, less painful movement and activity.  An effective stretch should be held for at least 20 seconds, although you may need to begin with shorter hold times for comfort.  A stretch should never be painful. You should only feel a gentle lengthening or release in the stretched tissue. STRETCH- Axial Extensors  Lie on your back on the floor. You may bend your knees for comfort. Place a rolled-up hand towel or dish towel, about 2 inches in diameter, under the part of your head that makes contact with the floor.  Gently tuck your chin, as if trying to make a "double chin," until you feel a gentle stretch at the base of your head.  Hold __________ seconds. Repeat __________ times. Complete this exercise __________ times per day.  STRETCH - Axial Extension   Stand or sit on a firm surface. Assume a good posture: chest up, shoulders drawn back, abdominal muscles slightly tense, knees unlocked (if standing) and feet hip width apart.  Slowly retract your chin so your head slides back  and your chin slightly lowers. Continue to look straight ahead.  You should feel a gentle stretch in the back of your head. Be certain not to feel an aggressive stretch since this can cause headaches later.  Hold for __________ seconds. Repeat __________ times. Complete this exercise __________ times per day. STRETCH - Cervical Side Bend   Stand or sit on a firm surface. Assume a good posture: chest up, shoulders drawn back, abdominal muscles slightly tense, knees unlocked (if standing) and feet hip width apart.  Without letting your nose or shoulders move, slowly tip your right / left ear to your shoulder until your feel a gentle stretch in the muscles on the opposite side of your neck.  Hold __________ seconds. Repeat __________ times. Complete this exercise __________ times per day. STRETCH - Cervical Rotators   Stand or sit on a firm surface. Assume a good posture: chest up, shoulders drawn back, abdominal muscles slightly tense, knees unlocked (if standing) and feet hip width apart.  Keeping your eyes level with the ground, slowly turn your head until you feel a gentle stretch along the back and opposite side of your neck.  Hold __________ seconds. Repeat __________ times. Complete this exercise __________ times per day. RANGE OF MOTION - Neck Circles   Stand or sit on a firm surface. Assume a good posture: chest up, shoulders drawn back, abdominal muscles slightly tense, knees unlocked (if standing) and feet hip width apart.  Gently roll your head down and around from the back  of one shoulder to the back of the other. The motion should never be forced or painful.  Repeat the motion 10-20 times, or until you feel the neck muscles relax and loosen. Repeat __________ times. Complete the exercise __________ times per day. STRENGTHENING EXERCISES - Cervical Strain and Sprain These exercises may help you when beginning to rehabilitate your injury. They may resolve your symptoms with or  without further involvement from your physician, physical therapist, or athletic trainer. While completing these exercises, remember:   Muscles can gain both the endurance and the strength needed for everyday activities through controlled exercises.  Complete these exercises as instructed by your physician, physical therapist, or athletic trainer. Progress the resistance and repetitions only as guided.  You may experience muscle soreness or fatigue, but the pain or discomfort you are trying to eliminate should never worsen during these exercises. If this pain does worsen, stop and make certain you are following the directions exactly. If the pain is still present after adjustments, discontinue the exercise until you can discuss the trouble with your clinician. STRENGTH - Cervical Flexors, Isometric  Face a wall, standing about 6 inches away. Place a small pillow, a ball about 6-8 inches in diameter, or a folded towel between your forehead and the wall.  Slightly tuck your chin and gently push your forehead into the soft object. Push only with mild to moderate intensity, building up tension gradually. Keep your jaw and forehead relaxed.  Hold 10 to 20 seconds. Keep your breathing relaxed.  Release the tension slowly. Relax your neck muscles completely before you start the next repetition. Repeat __________ times. Complete this exercise __________ times per day. STRENGTH- Cervical Lateral Flexors, Isometric   Stand about 6 inches away from a wall. Place a small pillow, a ball about 6-8 inches in diameter, or a folded towel between the side of your head and the wall.  Slightly tuck your chin and gently tilt your head into the soft object. Push only with mild to moderate intensity, building up tension gradually. Keep your jaw and forehead relaxed.  Hold 10 to 20 seconds. Keep your breathing relaxed.  Release the tension slowly. Relax your neck muscles completely before you start the next  repetition. Repeat __________ times. Complete this exercise __________ times per day. STRENGTH - Cervical Extensors, Isometric   Stand about 6 inches away from a wall. Place a small pillow, a ball about 6-8 inches in diameter, or a folded towel between the back of your head and the wall.  Slightly tuck your chin and gently tilt your head back into the soft object. Push only with mild to moderate intensity, building up tension gradually. Keep your jaw and forehead relaxed.  Hold 10 to 20 seconds. Keep your breathing relaxed.  Release the tension slowly. Relax your neck muscles completely before you start the next repetition. Repeat __________ times. Complete this exercise __________ times per day. POSTURE AND BODY MECHANICS CONSIDERATIONS - Cervical Strain and Sprain Keeping correct posture when sitting, standing or completing your activities will reduce the stress put on different body tissues, allowing injured tissues a chance to heal and limiting painful experiences. The following are general guidelines for improved posture. Your physician or physical therapist will provide you with any instructions specific to your needs. While reading these guidelines, remember:  The exercises prescribed by your provider will help you have the flexibility and strength to maintain correct postures.  The correct posture provides the optimal environment for your joints to work.  POSTURE AND BODY MECHANICS CONSIDERATIONS - Cervical Strain and Sprain  Keeping correct posture when sitting, standing or completing your activities will reduce the stress put on different body tissues, allowing injured tissues a chance to heal and limiting painful experiences. The following are general guidelines for improved posture. Your physician or physical therapist will provide you with any instructions specific to your needs. While reading these guidelines, remember:  · The exercises prescribed by your provider will help you have the flexibility and strength to maintain correct postures.  · The correct posture provides the optimal environment for your joints to work. All of your joints have less wear and tear when properly supported by a spine with good posture. This means you will experience a healthier, less painful body.  · Correct posture must be practiced with all of your activities, especially prolonged sitting and standing. Correct posture is as important when doing repetitive low-stress activities (typing) as it is when doing a single heavy-load activity (lifting).  PROLONGED STANDING WHILE SLIGHTLY LEANING FORWARD  When completing a task that requires you to lean forward while standing in one  place for a long time, place either foot up on a stationary 2- to 4-inch high object to help maintain the best posture. When both feet are on the ground, the low back tends to lose its slight inward curve. If this curve flattens (or becomes too large), then the back and your other joints will experience too much stress, fatigue more quickly, and can cause pain.   RESTING POSITIONS  Consider which positions are most painful for you when choosing a resting position. If you have pain with flexion-based activities (sitting, bending, stooping, squatting), choose a position that allows you to rest in a less flexed posture. You would want to avoid curling into a fetal position on your side. If your pain worsens with extension-based activities (prolonged standing, working overhead), avoid resting in an extended position such as sleeping on your stomach. Most people will find more comfort when they rest with their spine in a more neutral position, neither too rounded nor too arched. Lying on a non-sagging bed on your side with a pillow between your knees, or on your back with a pillow under your knees will often provide some relief. Keep in mind, being in any one position for a prolonged period of time, no matter how correct your posture, can still lead to stiffness.  WALKING  Walk with an upright posture. Your ears, shoulders, and hips should all line up.  OFFICE WORK  When working at a desk, create an environment that supports good, upright posture. Without extra support, muscles fatigue and lead to excessive strain on joints and other tissues.  CHAIR:  · A chair should be able to slide under your desk when your back makes contact with the back of the chair. This allows you to work closely.  · The chair's height should allow your eyes to be level with the upper part of your monitor and your hands to be slightly lower than your elbows.  · Body position:    Your feet should make contact with the floor. If this is not  possible, use a foot rest.    Keep your ears over your shoulders. This will reduce stress on your neck and low back.     This information is not intended to replace advice given to you by your health care provider. Make sure you discuss any questions you have with your health care provider.

## 2016-07-09 NOTE — Progress Notes (Signed)
That is correct 

## 2016-07-15 ENCOUNTER — Other Ambulatory Visit: Payer: Self-pay | Admitting: Physician Assistant

## 2016-07-22 DIAGNOSIS — H401122 Primary open-angle glaucoma, left eye, moderate stage: Secondary | ICD-10-CM | POA: Diagnosis not present

## 2016-07-22 DIAGNOSIS — H524 Presbyopia: Secondary | ICD-10-CM | POA: Diagnosis not present

## 2016-07-22 DIAGNOSIS — H401112 Primary open-angle glaucoma, right eye, moderate stage: Secondary | ICD-10-CM | POA: Diagnosis not present

## 2016-07-22 DIAGNOSIS — H2513 Age-related nuclear cataract, bilateral: Secondary | ICD-10-CM | POA: Diagnosis not present

## 2016-07-23 ENCOUNTER — Other Ambulatory Visit: Payer: Self-pay | Admitting: Physician Assistant

## 2016-07-24 ENCOUNTER — Other Ambulatory Visit: Payer: Self-pay

## 2016-07-24 MED ORDER — RIVAROXABAN 15 MG PO TABS: 15.0000 mg | ORAL_TABLET | Freq: Every day | ORAL | 6 refills | Status: DC

## 2016-07-31 ENCOUNTER — Other Ambulatory Visit: Payer: Self-pay | Admitting: Internal Medicine

## 2016-08-28 ENCOUNTER — Other Ambulatory Visit (INDEPENDENT_AMBULATORY_CARE_PROVIDER_SITE_OTHER): Payer: Medicare HMO

## 2016-08-28 ENCOUNTER — Ambulatory Visit (INDEPENDENT_AMBULATORY_CARE_PROVIDER_SITE_OTHER): Payer: Medicare HMO | Admitting: Internal Medicine

## 2016-08-28 VITALS — BP 134/72 | HR 80 | Temp 98.3°F | Resp 20

## 2016-08-28 DIAGNOSIS — I4891 Unspecified atrial fibrillation: Secondary | ICD-10-CM | POA: Diagnosis not present

## 2016-08-28 DIAGNOSIS — R202 Paresthesia of skin: Secondary | ICD-10-CM

## 2016-08-28 DIAGNOSIS — J3089 Other allergic rhinitis: Secondary | ICD-10-CM

## 2016-08-28 DIAGNOSIS — Z0001 Encounter for general adult medical examination with abnormal findings: Secondary | ICD-10-CM

## 2016-08-28 DIAGNOSIS — M545 Low back pain, unspecified: Secondary | ICD-10-CM

## 2016-08-28 DIAGNOSIS — G8929 Other chronic pain: Secondary | ICD-10-CM

## 2016-08-28 LAB — URINALYSIS, ROUTINE W REFLEX MICROSCOPIC
Bilirubin Urine: NEGATIVE
Hgb urine dipstick: NEGATIVE
Ketones, ur: NEGATIVE
Leukocytes, UA: NEGATIVE
NITRITE: NEGATIVE
PH: 5.5 (ref 5.0–8.0)
RBC / HPF: NONE SEEN (ref 0–?)
SPECIFIC GRAVITY, URINE: 1.025 (ref 1.000–1.030)
TOTAL PROTEIN, URINE-UPE24: NEGATIVE
URINE GLUCOSE: NEGATIVE
Urobilinogen, UA: 0.2 (ref 0.0–1.0)
WBC, UA: NONE SEEN (ref 0–?)

## 2016-08-28 LAB — LIPID PANEL
CHOL/HDL RATIO: 3
CHOLESTEROL: 196 mg/dL (ref 0–200)
HDL: 69.5 mg/dL (ref >39.00)
LDL CALC: 93 mg/dL (ref 0–99)
NonHDL: 126.89
TRIGLYCERIDES: 167 mg/dL — AB (ref 0.0–149.0)
VLDL: 33.4 mg/dL (ref 0.0–40.0)

## 2016-08-28 LAB — BASIC METABOLIC PANEL
BUN: 29 mg/dL — AB (ref 6–23)
CHLORIDE: 105 meq/L (ref 96–112)
CO2: 31 mEq/L (ref 19–32)
CREATININE: 1.51 mg/dL — AB (ref 0.40–1.20)
Calcium: 10 mg/dL (ref 8.4–10.5)
GFR: 35.59 mL/min — ABNORMAL LOW (ref >60.00)
GLUCOSE: 101 mg/dL — AB (ref 70–99)
Potassium: 4.9 mEq/L (ref 3.5–5.1)
Sodium: 142 mEq/L (ref 135–145)

## 2016-08-28 LAB — VITAMIN B12: Vitamin B-12: >1500 pg/mL — ABNORMAL HIGH (ref 211–911)

## 2016-08-28 LAB — HEPATIC FUNCTION PANEL
ALBUMIN: 4.4 g/dL (ref 3.5–5.2)
ALT: 28 U/L (ref 0–35)
AST: 17 U/L (ref 0–37)
Alkaline Phosphatase: 69 U/L (ref 39–117)
BILIRUBIN TOTAL: 0.6 mg/dL (ref 0.2–1.2)
Bilirubin, Direct: 0.1 mg/dL (ref 0.0–0.3)
Total Protein: 7.1 g/dL (ref 6.0–8.3)

## 2016-08-28 LAB — TSH: TSH: 2.94 u[IU]/mL (ref 0.35–4.50)

## 2016-08-28 MED ORDER — AMITRIPTYLINE HCL 50 MG PO TABS: 50.0000 mg | ORAL_TABLET | Freq: Every evening | ORAL | 1 refills | Status: DC | PRN

## 2016-08-28 NOTE — Progress Notes (Signed)
Subjective:    Patient ID: Carolyn Wells, female    DOB: 22-Nov-1940, 75 y.o.   MRN: [id number]  HPI Here for wellness and f/u;  Overall doing ok;  Pt denies Chest pain, worsening SOB, DOE, wheezing, orthopnea, PND, worsening LE edema, palpitations, dizziness or syncope.  Pt denies neurological change such as new headache, facial or extremity weakness.  Pt denies polydipsia, polyuria, or low sugar symptoms. Pt states overall good compliance with treatment and medications, good tolerability, and has been trying to follow appropriate diet.  Pt denies worsening depressive symptoms, suicidal ideation or panic. No fever, night sweats, wt loss, loss of appetite, or other constitutional symptoms.  Pt states good ability with ADL's, has low fall risk, home safety reviewed and adequate, no other significant changes in hearing or vision, and occasionally active with exercise with riding inside bike 3 miles per day.[name]  Has DXA with Dr Neal/gyn.  Refused further colonoscopy due to poor reaction with n/v/d with prep when tried feb 2018, stopped due to afib, seen per Dr Acie Fredrickson same day Colonoscopy deferred, pt refused further, and declines cologuard. . Pt asympt with afib, tx with xarelto and lopressor.  Pt continues to have recurring LBP without change in severity, bowel or bladder change, fever, wt loss,  worsening LE pain/numbness/weakness, gait change or falls,, will need ESI soon per pt with 5 days off xarelto prior.Also Does have several mo ongoing nasal allergy symptoms with clearish congestion, itch and sneezing, without fever, pain, ST, cough, swelling or wheezing. Tried zyrtec and nasal steroid but only mildly helps, still with persistent runny nose, clearish, for many months. Has 3 cats at home she does not want to part with. Also with bilat feet numbness pain every night for months, makes most nights difficult to tolerate, only sleep 2-3 hrs at at time.   Past Medical History:  Diagnosis Date  . ABDOMINAL  INCISIONAL HERNIA 08/13/2009   s/p repair  . Allergic rhinitis, cause unspecified 09/28/2011  . BRADYCARDIA 09/26/2010  . CKD (chronic kidney disease), stage III 02/26/2015   Solitary kidney  . COLONIC POLYPS, HX OF 08/25/2007  . DIVERTICULOSIS, COLON 08/25/2007  . GASTROESOPHAGEAL REFLUX DISEASE 08/04/2007  . HYPERLIPIDEMIA 09/27/2010   not on Rx therapy  . HYPERTENSION 08/06/2007  . Hypothyroidism   . INTERNAL HEMORRHOIDS 08/04/2007  . LOW BACK PAIN 08/06/2007   scoliosis; DJD; gets injections  . OSTEOPENIA 08/04/2007  . PAF (paroxysmal atrial fibrillation) ([facility])    a.  Echo 2/17:  Vigorous LVF, EF 65-70%, no RWMA, Ao sclerosis, trivial AI, trivial MR, trivial TR, PASP 23 mmHg  . Solitary kidney, acquired 09/28/2011   donated R kidney to husband   Past Surgical History:  Procedure Laterality Date  . LAMINECTOMY  1999   L4-5/lumbar  . NEPHRECTOMY     Right  . s/o incisional hernia repair     RUQ  . TONSILLECTOMY      reports that she has never smoked. She has never used smokeless tobacco. She reports that she does not drink alcohol or use drugs. family history includes Alzheimer's disease in her brother; Coronary artery disease (age of onset: 53) in her mother; Diabetes in her mother and other; Heart attack (age of onset: 21) in her father. Allergies  Allergen Reactions  . Hydrocodone Nausea Only and Other (See Comments)    lethargy   Current Outpatient Prescriptions on File Prior to Visit  Medication Sig Dispense Refill  . Ascorbic Acid (VITAMIN C) 1000 MG  tablet Take 1,000 mg by mouth daily.    . Calcium Citrate (CITRACAL PO) Take 2 tablets by mouth daily.     . diclofenac sodium (VOLTAREN) 1 % GEL Apply 1 application topically daily as needed (FOR BACK PAIN).     [name] levothyroxine (SYNTHROID, LEVOTHROID) 25 MCG tablet TAKE ONE TABLET BY MOUTH ONCE DAILY 90 tablet 0  . LUMIGAN 0.01 % SOLN Place 1 drop into both eyes at bedtime.     . methylPREDNISolone (MEDROL DOSEPAK) 4 MG  TBPK tablet Take as directed on package 21 tablet 0  . metoprolol tartrate (LOPRESSOR) 25 MG tablet TAKE ONE TABLET BY MOUTH TWICE DAILY 60 tablet 8  . Omega-3 Krill Oil 300 MG CAPS Take 1 capsule by mouth.    . pantoprazole (PROTONIX) 40 MG tablet TAKE ONE TABLET BY MOUTH ONCE DAILY 90 tablet 0  . Plant Sterols and Stanols (CHOLEST OFF PO) Take 900 mg by mouth daily.    . Red Yeast Rice Extract (RED YEAST RICE PO) Take by mouth.    . Rivaroxaban (XARELTO) 15 MG TABS tablet Take 1 tablet (15 mg total) by mouth daily with supper. 30 tablet 6  . TURMERIC PO Take 2 tablets by mouth once  Daily Patient not sure of dosage.     No current facility-administered medications on file prior to visit.    Review of Systems Constitutional: Negative for increased diaphoresis, or other activity, appetite or siginficant weight change other than noted HENT: Negative for worsening hearing loss, ear pain, facial swelling, mouth sores and neck stiffness.   Eyes: Negative for other worsening pain, redness or visual disturbance.  Respiratory: Negative for choking or stridor Cardiovascular: Negative for other chest pain and palpitations.  Gastrointestinal: Negative for worsening diarrhea, blood in stool, or abdominal distention Genitourinary: Negative for hematuria, flank pain or change in urine volume.  Musculoskeletal: Negative for myalgias or other joint complaints.  Skin: Negative for other color change and wound or drainage.  Neurological: Negative for syncope and numbness except for the above Hematological: Negative for adenopathy. or other swelling Psychiatric/Behavioral: Negative for hallucinations, SI, self-injury, decreased concentration or other worsening agitation.      Objective:   Physical Exam BP 134/72   Pulse 80   Temp 98.3 F (36.8 C) (Oral)   Resp 20   SpO2 97%  VS noted,  Constitutional: Pt is oriented to person, place, and time. Appears well-developed and well-nourished, in no  significant distress Head: Normocephalic and atraumatic  Eyes: Conjunctivae and EOM are normal. Pupils are equal, round, and reactive to light Right Ear: External ear normal.  Left Ear: External ear normal Nose: Nose normal.  Mouth/Throat: Oropharynx is clear and moist  Bilat tm's with mild erythema.  Max sinus areas non tender.  Pharynx with mild erythema, no exudate Neck: Normal range of motion. Neck supple. No JVD present. No tracheal deviation present or significant neck LA or mass Cardiovascular: Normal rate, irregular rhythm, normal heart sounds and intact distal pulses.   Pulmonary/Chest: Effort normal and breath sounds without rales or wheezing  Abdominal: Soft. Bowel sounds are normal. NT. No HSM  Musculoskeletal: Normal range of motion. Exhibits no edema Lymphadenopathy: Has no cervical adenopathy.  Neurological: Pt is alert and oriented to person, place, and time. Pt has normal reflexes. No cranial nerve deficit. Motor grossly intact Skin: Skin is warm and dry. No rash noted or new ulcers Psychiatric:  Has normal mood and affect. Behavior is normal.   Declines ecg today  Assessment & Plan:

## 2016-08-28 NOTE — Patient Instructions (Signed)
You had the flu shot today  Please take all new medication as prescribed - the elavil 50 mg at bedtime as needed  Please continue all other medications as before  Please have the pharmacy call with any other refills you may need.  Please continue your efforts at being more active, low cholesterol diet, and weight control.  You are otherwise up to date with prevention measures today.  Please keep your appointments with your specialists as you may have planned - cardiology, and orthopedic  You will be contacted regarding the referral for: Allergy   Please go to the LAB in the Basement (turn left off the elevator) for the tests to be done today  You will be contacted by phone if any changes need to be made immediately.  Otherwise, you will receive a letter about your results with an explanation, but please check with MyChart first.  Please remember to sign up for MyChart if you have not done so, as this will be important to you in the future with finding out test results, communicating by private email, and scheduling acute appointments online when needed.  Please return in 6 months, or sooner if needed

## 2016-08-28 NOTE — Progress Notes (Signed)
Pre visit review using our clinic review tool, if applicable. No additional management support is needed unless otherwise documented below in the visit note. 

## 2016-08-29 NOTE — Assessment & Plan Note (Signed)
Likely early periph neuropathy symptomatic due to age, will check b12, start elavil 50 qhs prn

## 2016-08-29 NOTE — Assessment & Plan Note (Signed)
Stable rate and volume,  to f/u any worsening symptoms or concerns, cont same tx, f/u cardiology as planned

## 2016-08-29 NOTE — Assessment & Plan Note (Signed)

## 2016-08-29 NOTE — Assessment & Plan Note (Signed)
Overall stable chronic, may also some improve with elavil?, for ortho f/u , pt plans to self refer

## 2016-08-29 NOTE — Assessment & Plan Note (Addendum)
Mild to mod, for OTC zyrtec and nasacort asd but has not worked well per pt, for refer allergy,  to f/u any worsening symptoms or concerns  In addition to the time spent performing CPE, I spent an additional 25 minutes face to face,in which greater than 50% of this time was spent in counseling and coordination of care for patient's acute illness as documented.

## 2016-08-31 ENCOUNTER — Other Ambulatory Visit (INDEPENDENT_AMBULATORY_CARE_PROVIDER_SITE_OTHER): Payer: Medicare HMO

## 2016-08-31 DIAGNOSIS — Z0000 Encounter for general adult medical examination without abnormal findings: Secondary | ICD-10-CM | POA: Diagnosis not present

## 2016-08-31 LAB — BASIC METABOLIC PANEL
BUN: 19 mg/dL (ref 6–23)
CALCIUM: 9.7 mg/dL (ref 8.4–10.5)
CO2: 29 mEq/L (ref 19–32)
CREATININE: 1.33 mg/dL — AB (ref 0.40–1.20)
Chloride: 107 mEq/L (ref 96–112)
GFR: 41.2 mL/min — AB (ref >60.00)
GLUCOSE: 89 mg/dL (ref 70–99)
Potassium: 5.2 mEq/L — ABNORMAL HIGH (ref 3.5–5.1)
Sodium: 141 mEq/L (ref 135–145)

## 2016-08-31 LAB — URINALYSIS, ROUTINE W REFLEX MICROSCOPIC
BILIRUBIN URINE: NEGATIVE
HGB URINE DIPSTICK: NEGATIVE
KETONES UR: NEGATIVE
LEUKOCYTES UA: NEGATIVE
NITRITE: NEGATIVE
RBC / HPF: NONE SEEN (ref 0–?)
Specific Gravity, Urine: 1.02 (ref 1.000–1.030)
TOTAL PROTEIN, URINE-UPE24: NEGATIVE
UROBILINOGEN UA: 0.2 (ref 0.0–1.0)
Urine Glucose: NEGATIVE
WBC, UA: NONE SEEN (ref 0–?)
pH: 5.5 (ref 5.0–8.0)

## 2016-08-31 LAB — LIPID PANEL
CHOL/HDL RATIO: 3
Cholesterol: 181 mg/dL (ref 0–200)
HDL: 69.1 mg/dL (ref >39.00)
LDL CALC: 76 mg/dL (ref 0–99)
NONHDL: 111.5
TRIGLYCERIDES: 179 mg/dL — AB (ref 0.0–149.0)
VLDL: 35.8 mg/dL (ref 0.0–40.0)

## 2016-08-31 LAB — CBC WITH DIFFERENTIAL/PLATELET
Basophils Absolute: 0 10*3/uL (ref 0.0–0.1)
Basophils Relative: 0.5 % (ref 0.0–3.0)
EOS PCT: 1.9 % (ref 0.0–5.0)
Eosinophils Absolute: 0.1 10*3/uL (ref 0.0–0.7)
HCT: 40.4 % (ref 36.0–46.0)
HEMOGLOBIN: 13.7 g/dL (ref 12.0–15.0)
Lymphocytes Relative: 25.1 % (ref 12.0–46.0)
Lymphs Abs: 1.8 10*3/uL (ref 0.7–4.0)
MCHC: 33.8 g/dL (ref 30.0–36.0)
MCV: 96.4 fl (ref 78.0–100.0)
MONOS PCT: 9.7 % (ref 3.0–12.0)
Monocytes Absolute: 0.7 10*3/uL (ref 0.1–1.0)
Neutro Abs: 4.5 10*3/uL (ref 1.4–7.7)
Neutrophils Relative %: 62.8 % (ref 43.0–77.0)
Platelets: 210 10*3/uL (ref 150.0–400.0)
RBC: 4.19 Mil/uL (ref 3.87–5.11)
RDW: 14.6 % (ref 11.5–15.5)
WBC: 7.1 10*3/uL (ref 4.0–10.5)

## 2016-08-31 LAB — HEPATIC FUNCTION PANEL
ALBUMIN: 4.1 g/dL (ref 3.5–5.2)
ALT: 21 U/L (ref 0–35)
AST: 18 U/L (ref 0–37)
Alkaline Phosphatase: 63 U/L (ref 39–117)
Bilirubin, Direct: 0.1 mg/dL (ref 0.0–0.3)
Total Bilirubin: 0.7 mg/dL (ref 0.2–1.2)
Total Protein: 6.7 g/dL (ref 6.0–8.3)

## 2016-08-31 LAB — TSH: TSH: 2.54 u[IU]/mL (ref 0.35–4.50)

## 2016-09-01 ENCOUNTER — Encounter: Payer: Self-pay | Admitting: Internal Medicine

## 2016-09-23 DIAGNOSIS — M5417 Radiculopathy, lumbosacral region: Secondary | ICD-10-CM | POA: Diagnosis not present

## 2016-09-23 DIAGNOSIS — M48062 Spinal stenosis, lumbar region with neurogenic claudication: Secondary | ICD-10-CM | POA: Diagnosis not present

## 2016-09-28 ENCOUNTER — Encounter: Payer: Self-pay | Admitting: Cardiovascular Disease

## 2016-09-28 ENCOUNTER — Ambulatory Visit (INDEPENDENT_AMBULATORY_CARE_PROVIDER_SITE_OTHER): Payer: Medicare HMO | Admitting: Cardiovascular Disease

## 2016-09-28 VITALS — BP 108/80 | HR 70 | Ht 67.0 in | Wt 148.8 lb

## 2016-09-28 DIAGNOSIS — I481 Persistent atrial fibrillation: Secondary | ICD-10-CM

## 2016-09-28 DIAGNOSIS — I10 Essential (primary) hypertension: Secondary | ICD-10-CM | POA: Diagnosis not present

## 2016-09-28 DIAGNOSIS — I4819 Other persistent atrial fibrillation: Secondary | ICD-10-CM

## 2016-09-28 MED ORDER — METOPROLOL TARTRATE 25 MG PO TABS
25.0000 mg | ORAL_TABLET | Freq: Two times a day (BID) | ORAL | 3 refills | Status: DC
Start: 2016-09-28 — End: 2017-10-22

## 2016-09-28 MED ORDER — RIVAROXABAN 15 MG PO TABS: 15.0000 mg | ORAL_TABLET | Freq: Every day | ORAL | 3 refills | Status: DC

## 2016-09-28 NOTE — Progress Notes (Signed)
Cardiology Office Note:    Date:  09/28/2016   ID:  Carolyn Wells, DOB 08-08-1941, MRN [id number]  PCP:  Cathlean Cower, MD  Cardiologist:    Liam Rogers   Electrophysiologist:  n/a  Referring MD:  Dr. Scarlette Shorts  Problem List  1. Paroxysmal atrial fib  2. Hyperlipidemia 3. Essential hypertension 4. CKD - donated a kidney to her husband   Chief Complaint  Patient presents with  . Atrial Fibrillation    History of Present Illness:     Carolyn Wells is a 75 y.o. female with a hx of HTN, HL, atrial fib  CKD stage 3 (solitary kidney).   CHADS2-VASc=4 (female, age 9, HTN).  This patients CHA2DS2-VASc Score and unadjusted Ischemic Stroke Rate (% per year) is equal to 4.8 % stroke rate/year from a score of 4 Above score calculated as 1 point each if present [CHF, HTN, DM, Vascular=MI/PAD/Aortic Plaque, Age if 65-74, or Female] Above score calculated as 2 points each if present [Age > 75, or Stroke/TIA/TE]  Mar 24, 2016:  Feels very well. No symptoms.     still active.   Exercises regularly  Checks BP regularly ,  Her reading are all very good.  Is on metoprolol for rate control and mild HTN.    Nov. 20, 2017:  [name] is doing great She remains asymptomatic. She is still in atrial ablation but cannot tell that her heart beat is irregular.    Past Medical History:  Diagnosis Date  . ABDOMINAL INCISIONAL HERNIA 08/13/2009   s/p repair  . Allergic rhinitis, cause unspecified 09/28/2011  . BRADYCARDIA 09/26/2010  . CKD (chronic kidney disease), stage III 02/26/2015   Solitary kidney  . COLONIC POLYPS, HX OF 08/25/2007  . DIVERTICULOSIS, COLON 08/25/2007  . GASTROESOPHAGEAL REFLUX DISEASE 08/04/2007  . HYPERLIPIDEMIA 09/27/2010   not on Rx therapy  . HYPERTENSION 08/06/2007  . Hypothyroidism   . INTERNAL HEMORRHOIDS 08/04/2007  . LOW BACK PAIN 08/06/2007   scoliosis; DJD; gets injections  . OSTEOPENIA 08/04/2007  . PAF (paroxysmal atrial fibrillation) ([facility])    a.  Echo 2/17:  Vigorous LVF, EF 65-70%, no RWMA, Ao sclerosis, trivial AI, trivial MR, trivial TR, PASP 23 mmHg  . Solitary kidney, acquired 09/28/2011   donated R kidney to husband    Past Surgical History:  Procedure Laterality Date  . LAMINECTOMY  1999   L4-5/lumbar  . NEPHRECTOMY     Right  . s/o incisional hernia repair     RUQ  . TONSILLECTOMY      Current Medications: Outpatient Medications Prior to Visit  Medication Sig Dispense Refill  . amitriptyline (ELAVIL) 50 MG tablet Take 1 tablet (50 mg total) by mouth at bedtime as needed for sleep. 90 tablet 1  . Ascorbic Acid (VITAMIN C) 1000 MG tablet Take 1,000 mg by mouth daily.    . Calcium Citrate (CITRACAL PO) Take 2 tablets by mouth daily.     . diclofenac sodium (VOLTAREN) 1 % GEL Apply 1 application topically daily as needed (FOR BACK PAIN).     [name] levothyroxine (SYNTHROID, LEVOTHROID) 25 MCG tablet TAKE ONE TABLET BY MOUTH ONCE DAILY 90 tablet 0  . LUMIGAN 0.01 % SOLN Place 1 drop into both eyes at bedtime.     . metoprolol tartrate (LOPRESSOR) 25 MG tablet TAKE ONE TABLET BY MOUTH TWICE DAILY 60 tablet 8  . Omega-3 Krill Oil 300 MG CAPS Take 1 capsule by mouth.    . pantoprazole (  PROTONIX) 40 MG tablet TAKE ONE TABLET BY MOUTH ONCE DAILY 90 tablet 0  . Plant Sterols and Stanols (CHOLEST OFF PO) Take 900 mg by mouth daily.    . Red Yeast Rice Extract (RED YEAST RICE PO) Take by mouth.    . Rivaroxaban (XARELTO) 15 MG TABS tablet Take 1 tablet (15 mg total) by mouth daily with supper. 30 tablet 6  . TURMERIC PO Take 2 tablets by mouth once  Daily Patient not sure of dosage.    . methylPREDNISolone (MEDROL DOSEPAK) 4 MG TBPK tablet Take as directed on package 21 tablet 0   No facility-administered medications prior to visit.      Allergies:   Hydrocodone   Social History   Social History  . Marital status: Married    Spouse name: [name]  . Number of children: 2  . Years of education: [name]   Occupational History    . 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 Smoking status: Never Smoker  . Smokeless tobacco: Never Used  . Alcohol use No  . Drug use: No  . Sexual activity: Not Asked   Other Topics Concern  . None   Social History Narrative   Husband disabled, renal failure, DM, HTN.   Originally from [place], [place] >> came to [place] 1966 (husband in [employer] treasurer)        Family History:  The patient's family history includes Alzheimer's disease in her brother; Coronary artery disease (age of onset: 51) in her mother; Diabetes in her mother and other; Heart attack (age of onset: 63) in her father.   ROS:   Please see the history of present illness.    ROSAll other systems reviewed and are negative.   Physical Exam:    VS:  BP 108/80 (BP Location: Right Arm, Patient Position: Sitting, Cuff Size: Normal)   Pulse 70   Ht 5\' 7"  (1.702 m)   Wt 148 lb 12.8 oz (67.5 kg)   BMI 23.31 kg/m    GEN: Well nourished, well developed, in no acute distress  HEENT: normal  Neck: no JVD, no masses Cardiac: irreg. Irreg. Normal S1/S2   no murmurs,   no edema    Respiratory:  clear to auscultation bilaterally; no wheezing, rhonchi or rales GI: soft, nontender  MS: no deformity or atrophy  Skin: warm and dry  Neuro:  no focal deficits  Psych: Alert and oriented x 3, normal affect  Wt Readings from Last 3 Encounters:  09/28/16 148 lb 12.8 oz (67.5 kg)  07/08/16 152 lb (68.9 kg)  05/04/16 148 lb 8 oz (67.4 kg)      Studies/Labs Reviewed:     EKG:  EKG is  ordered today.  The ekg ordered today demonstrates sinus brady, HR 55, normal axis, septal Q waves, no ST changes, QTc 397 ms  Recent Labs: 08/31/2016: ALT 21; BUN 19; Creatinine, Ser 1.33; Hemoglobin 13.7; Platelets 210.0; Potassium 5.2; Sodium 141; TSH 2.54   Recent Lipid Panel    Component Value Date/Time   CHOL 181 08/31/2016 1303   TRIG 179.0 (H) 08/31/2016 1303   HDL 69.10 08/31/2016 1303   CHOLHDL 3  08/31/2016 1303   VLDL 35.8 08/31/2016 1303   LDLCALC 76 08/31/2016 1303   LDLDIRECT 117.7 09/26/2013 1420     Assessment / PLAN:      1. PAF - she remains  in atrial fib.  Is completely asymptomatic. At this point we will continue  with rate control and anticoagulation .    2. HTN - BP controlled.   3. CKD - She has a solitary kidney due to transplant donation to her husband years ago.  Check BMET in 6 weeks.  5. Hyperlipidemia:   Labs were     [name], MD  09/28/2016 3:20 PM    [facility] [address], [place], [place]  [address] Phone: [phone]; Fax: [phone]

## 2016-09-28 NOTE — Patient Instructions (Signed)

## 2016-10-05 ENCOUNTER — Other Ambulatory Visit: Payer: Self-pay | Admitting: Internal Medicine

## 2016-10-06 ENCOUNTER — Ambulatory Visit (INDEPENDENT_AMBULATORY_CARE_PROVIDER_SITE_OTHER): Payer: Medicare HMO | Admitting: Allergy and Immunology

## 2016-10-06 ENCOUNTER — Encounter: Payer: Self-pay | Admitting: Allergy and Immunology

## 2016-10-06 VITALS — BP 150/92 | HR 64 | Temp 97.8°F | Resp 18 | Ht 64.96 in | Wt 149.0 lb

## 2016-10-06 DIAGNOSIS — J3089 Other allergic rhinitis: Secondary | ICD-10-CM

## 2016-10-06 DIAGNOSIS — J310 Chronic rhinitis: Secondary | ICD-10-CM

## 2016-10-06 MED ORDER — IPRATROPIUM BROMIDE 0.06 % NA SOLN: NASAL | 5 refills | Status: DC

## 2016-10-06 NOTE — Patient Instructions (Addendum)
  1. Allergen avoidance measures?  2. OTC Rhinocort one spray each nostril one time per day  3. Ipratropium 0.06% nasal spray 2 sprays each nostril every 6 hours as needed to dry up nose.  4. Further evaluation and treatment?  5. Contact clinic in 2 weeks regarding response to treatment.

## 2016-10-06 NOTE — Progress Notes (Signed)
Dear Dr. Jenny Reichmann,  Thank you for referring Carolyn Wells to the [facility] on 10/06/2016.   Below is a summation of this patient's evaluation and recommendations.  Thank you for your referral. I will keep you informed about this patient's response to treatment.   If you have any questions please do not hesitate to contact me.   Sincerely,  Carolyn Prows, MD [name]   ______________________________________________________________________    NEW PATIENT NOTE  Referring Provider: Biagio Borg, MD Primary Provider: Cathlean Cower, MD Date of office visit: 10/06/2016    Subjective:   Chief Complaint:  Carolyn Wells (DOB: 03-21-1941) is a 75 y.o. female who presents to the clinic on 10/06/2016 with a chief complaint of Nasal Congestion (Runny nose) .     HPI: [name] presents to this clinic in evaluation of rhinorrhea. She has a relatively short history of developing rhinorrhea over the course of the past 8 months. She has clear nasal discharge without any sneezing or anosmia or headaches. There is no obvious provoking factor giving rise to this issue. She has tried some antihistamines in the past which have not helped her. She used a Medrol Dosepak for a neck issue about 2-1/2 months ago and she's not really sure that this helped her nose as well. She does not have an atopic history.  Past Medical History:  Diagnosis Date  . ABDOMINAL INCISIONAL HERNIA 08/13/2009   s/p repair  . Allergic rhinitis, cause unspecified 09/28/2011  . BRADYCARDIA 09/26/2010  . CKD (chronic kidney disease), stage III 02/26/2015   Solitary kidney  . COLONIC POLYPS, HX OF 08/25/2007  . DIVERTICULOSIS, COLON 08/25/2007  . GASTROESOPHAGEAL REFLUX DISEASE 08/04/2007  . HYPERLIPIDEMIA 09/27/2010   not on Rx therapy  . HYPERTENSION 08/06/2007  . Hypothyroidism   . INTERNAL HEMORRHOIDS 08/04/2007  . LOW BACK  PAIN 08/06/2007   scoliosis; DJD; gets injections  . OSTEOPENIA 08/04/2007  . PAF (paroxysmal atrial fibrillation) ([facility])    a.  Echo 2/17:  Vigorous LVF, EF 65-70%, no RWMA, Ao sclerosis, trivial AI, trivial MR, trivial TR, PASP 23 mmHg  . Solitary kidney, acquired 09/28/2011   donated R kidney to husband    Past Surgical History:  Procedure Laterality Date  . LAMINECTOMY  1999   L4-5/lumbar  . NEPHRECTOMY     Right  . s/o incisional hernia repair     RUQ  . TONSILLECTOMY        Medication List      CHOLEST OFF PO Take 900 mg by mouth daily.   CITRACAL PO Take 2 tablets by mouth daily.   diclofenac sodium 1 % Gel Commonly known as:  VOLTAREN Apply 1 application topically daily as needed (FOR BACK PAIN).   levothyroxine 25 MCG tablet Commonly known as:  SYNTHROID, LEVOTHROID TAKE ONE TABLET BY MOUTH ONCE DAILY   LUMIGAN 0.01 % Soln Generic drug:  bimatoprost Place 1 drop into both eyes at bedtime.   metoprolol tartrate 25 MG tablet Commonly known as:  LOPRESSOR Take 1 tablet (25 mg total) by mouth 2 (two) times daily.   Omega-3 Krill Oil 300 MG Caps Take 1 capsule by mouth.   pantoprazole 40 MG tablet Commonly known as:  PROTONIX TAKE ONE TABLET BY MOUTH ONCE DAILY   RED YEAST RICE PO Take by mouth.   Rivaroxaban 15 MG Tabs tablet Commonly known as:  XARELTO Take  1 tablet (15 mg total) by mouth daily with supper.   TURMERIC PO Take 2 tablets by mouth once  Daily Patient not sure of dosage.   vitamin C 1000 MG tablet Take 1,000 mg by mouth daily.       Allergies  Allergen Reactions  . Hydrocodone Nausea Only and Other (See Comments)    lethargy    Review of systems negative except as noted in HPI / PMHx or noted below:  Review of Systems  Constitutional: Negative.   HENT: Negative.   Eyes: Negative.   Respiratory: Negative.   Cardiovascular: Negative.   Gastrointestinal: Negative.   Genitourinary: Negative.   Musculoskeletal: Negative.    Skin: Negative.   Neurological: Negative.   Endo/Heme/Allergies: Negative.   Psychiatric/Behavioral: Negative.     Family History  Problem Relation Age of Onset  . Coronary artery disease Mother 59    1st degree relative   . Diabetes Other     1st degree relative  . Diabetes Mother   . Heart attack Father 64  . Alzheimer's disease Brother     Social History   Social History  . Marital status: Married    Spouse name: [name]  . Number of children: 2  . Years of education: [name]   Occupational History  . 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 Smoking status: Never Smoker  . Smokeless tobacco: Never Used  . Alcohol use No  . Drug use: No  . Sexual activity: Not on file   Other Topics Concern  . Not on file   Social History Narrative   Husband disabled, renal failure, DM, HTN.   Originally from [place], [place] >> came to [place] 1966 (husband in [employer] treasurer)       Programme researcher, broadcasting/film/video and Social history  Lives in a house with a dry environment, cats located inside the household, no carpeting in the bedroom, plastic on the bed and pillow, and no smokers located inside the household  Objective:   Vitals:   10/06/16 0938  BP: (!) 150/92  Pulse: 64  Resp: 18  Temp: 97.8 F (36.6 C)   Height: 5' 4.96" (165 cm) Weight: 149 lb (67.6 kg)  Physical Exam  Constitutional: She is well-developed, well-nourished, and in no distress.  HENT:  Head: Normocephalic. Head is without right periorbital erythema and without left periorbital erythema.  Right Ear: Tympanic membrane, external ear and ear canal normal.  Left Ear: Tympanic membrane, external ear and ear canal normal.  Nose: Nose normal. No mucosal edema or rhinorrhea.  Mouth/Throat: Uvula is midline, oropharynx is clear and moist and mucous membranes are normal. No oropharyngeal exudate.  Eyes: Conjunctivae and lids are normal. Pupils are equal, round, and reactive to light.  Neck: Trachea normal.  No tracheal tenderness present. No tracheal deviation present. No thyromegaly present.  Cardiovascular: Normal rate, regular rhythm, S1 normal, S2 normal and normal heart sounds.   No murmur heard. Pulmonary/Chest: Effort normal and breath sounds normal. No stridor. No tachypnea. No respiratory distress. She has no wheezes. She has no rales. She exhibits no tenderness.  Abdominal: Soft. She exhibits no distension and no mass. There is no hepatosplenomegaly. There is no tenderness. There is no rebound and no guarding.  Musculoskeletal: She exhibits no edema or tenderness.  Lymphadenopathy:       Head (right side): No tonsillar adenopathy present.       Head (left side): No tonsillar adenopathy present.    She  has no cervical adenopathy.    She has no axillary adenopathy.  Neurological: She is alert. Gait normal.  Skin: No rash noted. She is not diaphoretic. No erythema. No pallor. Nails show no clubbing.  Psychiatric: Mood and affect normal.    Diagnostics: Allergy skin tests were performed. She did not demonstrate any hypersensitivity against a screening panel of aeroallergens or foods.   Assessment and Plan:    1. Other allergic rhinitis   2. Nonallergic rhinitis     1. Allergen avoidance measures?  2. OTC Rhinocort one spray each nostril one time per day  3. Ipratropium 0.06% nasal spray 2 sprays each nostril every 6 hours as needed to dry up nose.  4. Further evaluation and treatment?  5. Contact clinic in 2 weeks regarding response to treatment.  I will have Alvie start a daily administration of a nasal steroid and she has the option of using an anticholinergic agent for her upper airways as needed to dry up her nose. I suspect that she will do relatively well with this plan and she's going to contact me in approximately 2 weeks noting her response and will make a decision about further evaluation and treatment pending his response. I could not provide her any information  about allergen avoidance measures as on her screening skin testes did not appear to identify any specific allergen contributing to this issue.  Carolyn Prows, MD [facility] [place]

## 2016-10-28 ENCOUNTER — Other Ambulatory Visit: Payer: Self-pay | Admitting: Internal Medicine

## 2016-11-18 DIAGNOSIS — H401122 Primary open-angle glaucoma, left eye, moderate stage: Secondary | ICD-10-CM | POA: Diagnosis not present

## 2016-11-18 DIAGNOSIS — H401112 Primary open-angle glaucoma, right eye, moderate stage: Secondary | ICD-10-CM | POA: Diagnosis not present

## 2017-01-22 ENCOUNTER — Other Ambulatory Visit: Payer: Self-pay | Admitting: Internal Medicine

## 2017-02-17 ENCOUNTER — Ambulatory Visit (INDEPENDENT_AMBULATORY_CARE_PROVIDER_SITE_OTHER): Payer: Medicare HMO | Admitting: Internal Medicine

## 2017-02-17 ENCOUNTER — Encounter: Payer: Self-pay | Admitting: Internal Medicine

## 2017-02-17 VITALS — BP 152/92 | HR 72 | Ht 66.5 in | Wt 155.4 lb

## 2017-02-17 DIAGNOSIS — R197 Diarrhea, unspecified: Secondary | ICD-10-CM

## 2017-02-17 DIAGNOSIS — K219 Gastro-esophageal reflux disease without esophagitis: Secondary | ICD-10-CM

## 2017-02-17 DIAGNOSIS — R140 Abdominal distension (gaseous): Secondary | ICD-10-CM

## 2017-02-17 DIAGNOSIS — Z86010 Personal history of colonic polyps: Secondary | ICD-10-CM

## 2017-02-17 MED ORDER — VSL#3 PO CAPS: 2.0000 | ORAL_CAPSULE | Freq: Every day | ORAL | 0 refills | Status: DC

## 2017-02-17 NOTE — Progress Notes (Signed)
HISTORY OF PRESENT ILLNESS:  Carolyn Wells is a 76 y.o. female with past medical history as listed below including atrial fibrillation for which she is on Xarelto therapy. Patient's GI history is remarkable for GERD for which she has been on PPI and adenomatous colon polyps for which she is overdue for follow-up. Patient's chief complaint today is diarrhea. She reports developing the problem in January. About a month ago she stopped several her medications including pantoprazole on red yeast. She tells me that her diarrhea has improved the stools are still soft. She does report significant bloating and gas. She wonders if the problem was PPI side effect. She had been on Prevacid for many years without issues. Switch to omeprazole for insurance formulary purposes. Ineffective so PCP changed to pantoprazole which she has been on for about one year. Off medication patient has incapacitating reflux symptoms. She wonders about alternative therapies were resumption of therapy. We last saw her in January 2017 for surveillance colonoscopy. Her index examine 2008 was remarkable for advanced adenoma and tubulovillous adenoma. She has not responded to initial surveillance request. At the time of her planned surveillance last year she was noted to be in atrial fibrillation, though asymptomatic. Subsequent cardiology evaluation for assessment and treatment.  REVIEW OF SYSTEMS:  All non-GI ROS negative except for back pain and sinus trouble  Past Medical History:  Diagnosis Date  . ABDOMINAL INCISIONAL HERNIA 08/13/2009   s/p repair  . Allergic rhinitis, cause unspecified 09/28/2011  . BRADYCARDIA 09/26/2010  . CKD (chronic kidney disease), stage III 02/26/2015   Solitary kidney  . COLONIC POLYPS, HX OF 08/25/2007  . DIVERTICULOSIS, COLON 08/25/2007  . GASTROESOPHAGEAL REFLUX DISEASE 08/04/2007  . HYPERLIPIDEMIA 09/27/2010   not on Rx therapy  . HYPERTENSION 08/06/2007  . Hypothyroidism   . INTERNAL  HEMORRHOIDS 08/04/2007  . LOW BACK PAIN 08/06/2007   scoliosis; DJD; gets injections  . OSTEOPENIA 08/04/2007  . PAF (paroxysmal atrial fibrillation) ([place])    a.  Echo 2/17:  Vigorous LVF, EF 65-70%, no RWMA, Ao sclerosis, trivial AI, trivial MR, trivial TR, PASP 23 mmHg  . Solitary kidney, acquired 09/28/2011   donated R kidney to husband    Past Surgical History:  Procedure Laterality Date  . LAMINECTOMY  1999   L4-5/lumbar  . NEPHRECTOMY     Right  . s/o incisional hernia repair     RUQ  . TONSILLECTOMY      Social History [name]  reports that she has never smoked. She has never used smokeless tobacco. She reports that she does not drink alcohol or use drugs.  family history includes Alzheimer's disease in her brother; Coronary artery disease (age of onset: 34) in her mother; Diabetes in her mother and other; Heart attack (age of onset: 90) in her father.  Allergies  Allergen Reactions  . Hydrocodone Nausea Only and Other (See Comments)    lethargy       PHYSICAL EXAMINATION: Vital signs: BP (!) 152/92   Pulse 72   Ht 5' 6.5" (1.689 m)   Wt 155 lb 6.4 oz (70.5 kg)   BMI 24.71 kg/m   Constitutional: generally well-appearing, no acute distress Psychiatric: alert and oriented x3, cooperative Eyes: extraocular movements intact, anicteric, conjunctiva pink Mouth: oral pharynx moist, no lesions Neck: supple no lymphadenopathy Cardiovascular: heart Irregular rate and rhythm, no murmur Lungs: clear to auscultation bilaterally Abdomen: soft, nontender, nondistended, no obvious ascites, no peritoneal signs, normal bowel sounds, no organomegaly Rectal: Omitted  Extremities: no clubbing cyanosis or lower extremity edema bilaterally Skin: no lesions on visible extremities Neuro: No focal deficits. Cranial nerves intact  ASSESSMENT:  #1. Transient problems with diarrhea. Possibly infectious. Possibly medication related. Improving #2. Increased intestinal gas as  manifested by bloating #3. GERD. Significant symptoms off PPI #4. History of adenomatous and tubulovillous adenoma 2008. Overdue for follow-up. #5. Atrial fibrillation on oral anticoagulant therapy   PLAN:  #1. Discussed possible causes for diarrhea and management #2. Resume PPI for significant reflux symptoms. We will observe for the possibility of recurrent diarrhea. If so may switch medication #3. Recommend VSL 3 for bloating and loose bowels. Samples given #4. Advised importance of surveillance colonoscopy. She declines. She understands the risk of cancer. She has been invited to reschedule in the future should she change her mind. We would need to address her anticoagulation if that is the case.   25 minutes spent face-to-face with the patient. Greater than 50% a time use for counseling regarding management of her GERD, diarrhea, bloating, and discussing the importance of surveillance colonoscopy

## 2017-02-17 NOTE — Patient Instructions (Signed)
You have been given samples of VSL#3.  Take 2 a day for 2 weeks  When you have finished you may resume your PPI

## 2017-02-26 ENCOUNTER — Encounter: Payer: Self-pay | Admitting: Internal Medicine

## 2017-02-26 ENCOUNTER — Ambulatory Visit (INDEPENDENT_AMBULATORY_CARE_PROVIDER_SITE_OTHER): Payer: Medicare HMO | Admitting: Internal Medicine

## 2017-02-26 VITALS — BP 142/100 | HR 51 | Ht 66.5 in | Wt 152.0 lb

## 2017-02-26 DIAGNOSIS — Z7901 Long term (current) use of anticoagulants: Secondary | ICD-10-CM

## 2017-02-26 DIAGNOSIS — N183 Chronic kidney disease, stage 3 unspecified: Secondary | ICD-10-CM

## 2017-02-26 DIAGNOSIS — J3489 Other specified disorders of nose and nasal sinuses: Secondary | ICD-10-CM | POA: Diagnosis not present

## 2017-02-26 DIAGNOSIS — K219 Gastro-esophageal reflux disease without esophagitis: Secondary | ICD-10-CM | POA: Diagnosis not present

## 2017-02-26 DIAGNOSIS — J310 Chronic rhinitis: Secondary | ICD-10-CM

## 2017-02-26 NOTE — Progress Notes (Signed)
Subjective:    Patient ID: Carolyn Wells, female    DOB: 1941/06/14, 76 y.o.   MRN: [id number]  HPI  Here to f/u; overall doing ok,  Pt denies chest pain, increasing sob or doe, wheezing, orthopnea, PND, increased LE swelling, palpitations, dizziness or syncope..  Pt denies polydipsia, polyuria, or low sugar episode.   Pt denies new neurological symptoms such as new headache, or facial or extremity weakness or numbness.   Pt states overall good compliance with meds, mostly trying to follow appropriate diet, with wt overall stable, Did see Dr Perry/GI last wk with 2 mo diarrhea, but held herself on taking the protonix, magnesium, red yeast rice and seemed better, but advised for probiotic sample and has helped greatly.  Plans to restart protonix and cont the probiotic. Did also see Dr Koslow/allergy with nasal symtpoms but found to have no allergy on testing. Took otc rhinocort and iprotroprium  Nasal but stopped after 2 wks due to nosebleeds.    Has some reduced airflow to right nostril ongoing, asks for ENT referral. .  Pt declines colonoscopy f/u as she is due, but will accept cologuard.. Remains on xarlelto and fears stopping even temporarily for colonoscopy.  May reconsider if cologuard abnormal.  Past Medical History:  Diagnosis Date  . ABDOMINAL INCISIONAL HERNIA 08/13/2009   s/p repair  . Allergic rhinitis, cause unspecified 09/28/2011  . BRADYCARDIA 09/26/2010  . CKD (chronic kidney disease), stage III 02/26/2015   Solitary kidney  . COLONIC POLYPS, HX OF 08/25/2007  . DIVERTICULOSIS, COLON 08/25/2007  . GASTROESOPHAGEAL REFLUX DISEASE 08/04/2007  . HYPERLIPIDEMIA 09/27/2010   not on Rx therapy  . HYPERTENSION 08/06/2007  . Hypothyroidism   . INTERNAL HEMORRHOIDS 08/04/2007  . LOW BACK PAIN 08/06/2007   scoliosis; DJD; gets injections  . OSTEOPENIA 08/04/2007  . PAF (paroxysmal atrial fibrillation) ([place])    a.  Echo 2/17:  Vigorous LVF, EF 65-70%, no RWMA, Ao sclerosis, trivial AI,  trivial MR, trivial TR, PASP 23 mmHg  . Solitary kidney, acquired 09/28/2011   donated R kidney to husband   Past Surgical History:  Procedure Laterality Date  . LAMINECTOMY  1999   L4-5/lumbar  . NEPHRECTOMY     Right  . s/o incisional hernia repair     RUQ  . TONSILLECTOMY      reports that she has never smoked. She has never used smokeless tobacco. She reports that she does not drink alcohol or use drugs. family history includes Alzheimer's disease in her brother; Coronary artery disease (age of onset: 64) in her mother; Diabetes in her mother and other; Heart attack (age of onset: 54) in her father. Allergies  Allergen Reactions  . Hydrocodone Nausea Only and Other (See Comments)    lethargy   Current Outpatient Prescriptions on File Prior to Visit  Medication Sig Dispense Refill  . Ascorbic Acid (VITAMIN C) 1000 MG tablet Take 1,000 mg by mouth daily.    . Calcium Citrate (CITRACAL PO) Take 2 tablets by mouth daily.     . diclofenac sodium (VOLTAREN) 1 % GEL Apply 1 application topically daily as needed (FOR BACK PAIN).     [name] levothyroxine (SYNTHROID, LEVOTHROID) 25 MCG tablet TAKE ONE TABLET BY MOUTH ONCE DAILY 90 tablet 0  . LUMIGAN 0.01 % SOLN Place 1 drop into both eyes at bedtime.     . metoprolol tartrate (LOPRESSOR) 25 MG tablet Take 1 tablet (25 mg total) by mouth 2 (two) times daily. 180 tablet  3  . Omega-3 Krill Oil 300 MG CAPS Take 1 capsule by mouth.    . Probiotic Product (VSL#3) CAPS Take 2 capsules by mouth daily. 28 capsule 0  . Rivaroxaban (XARELTO) 15 MG TABS tablet Take 1 tablet (15 mg total) by mouth daily with supper. 90 tablet 3  . TURMERIC PO Take 2 tablets by mouth once  Daily Patient not sure of dosage.     No current facility-administered medications on file prior to visit.    Review of Systems  Constitutional: Negative for other unusual diaphoresis or sweats HENT: Negative for ear discharge or swelling Eyes: Negative for other worsening visual  disturbances Respiratory: Negative for stridor or other swelling  Gastrointestinal: Negative for worsening distension or other blood Genitourinary: Negative for retention or other urinary change Musculoskeletal: Negative for other MSK pain or swelling Skin: Negative for color change or other new lesions Neurological: Negative for worsening tremors and other numbness  Psychiatric/Behavioral: Negative for worsening agitation or other fatigue All other system neg per pt    Objective:   Physical Exam BP (!) 142/100   Pulse (!) 51   Ht 5' 6.5" (1.689 m)   Wt 152 lb (68.9 kg)   SpO2 98%   BMI 24.17 kg/m  VS noted,  Constitutional: Pt appears in NAD HENT: Head: NCAT.  Right Ear: External ear normal.  Left Ear: External ear normal.  Eyes: . Pupils are equal, round, and reactive to light. Conjunctivae and EOM are normal Nose: without d/c or deformity Neck: Neck supple. Gross normal ROM Cardiovascular: Normal rate and regular rhythm.   Pulmonary/Chest: Effort normal and breath sounds without rales or wheezing.  Abd:  Soft, NT, ND, + BS, no organomegaly Neurological: Pt is alert. At baseline orientation, motor grossly intact Skin: Skin is warm. No rashes, other new lesions, no LE edema Psychiatric: Pt behavior is normal without agitation  No other exam findings  Lab Results  Component Value Date   WBC 7.1 08/31/2016   HGB 13.7 08/31/2016   HCT 40.4 08/31/2016   PLT 210.0 08/31/2016   GLUCOSE 89 08/31/2016   CHOL 181 08/31/2016   TRIG 179.0 (H) 08/31/2016   HDL 69.10 08/31/2016   LDLDIRECT 117.7 09/26/2013   LDLCALC 76 08/31/2016   ALT 21 08/31/2016   AST 18 08/31/2016   NA 141 08/31/2016   K 5.2 (H) 08/31/2016   CL 107 08/31/2016   CREATININE 1.33 (H) 08/31/2016   BUN 19 08/31/2016   CO2 29 08/31/2016   TSH 2.54 08/31/2016   INR 1.03 10/22/2009      Assessment & Plan:

## 2017-02-26 NOTE — Progress Notes (Signed)
Pre visit review using our clinic review tool, if applicable. No additional management support is needed unless otherwise documented below in the visit note. 

## 2017-02-26 NOTE — Patient Instructions (Signed)
OK to try the OTC Nasacort for the nasal symptoms  You will be contacted regarding the referral for: ENT  Please continue all other medications as before, and refills have been done if requested.  Please have the pharmacy call with any other refills you may need.  Please continue your efforts at being more active, low cholesterol diet, and weight control.  Please keep your appointments with your specialists as you may have planned  You will be contacted regarding the referral for: Cologuard  Please return in 6 months, or sooner if needed

## 2017-02-27 DIAGNOSIS — Z7901 Long term (current) use of anticoagulants: Secondary | ICD-10-CM | POA: Insufficient documentation

## 2017-02-27 NOTE — Assessment & Plan Note (Signed)
With some sense of reduced airflow right nares? Pt requests ENT referral

## 2017-02-27 NOTE — Assessment & Plan Note (Signed)
Stable on TUMS prn,  to f/u any worsening symptoms or concerns

## 2017-02-27 NOTE — Assessment & Plan Note (Addendum)
Note pt has solitary kidney, o/w stable overall by history and exam, recent data reviewed with pt, and pt to continue medical treatment as before,  to f/u any worsening symptoms or concerns Lab Results  Component Value Date   CREATININE 1.33 (H) 08/31/2016  cont current tx, avoid nephrotoxins

## 2017-02-27 NOTE — Assessment & Plan Note (Signed)
Ok for trial OTC Nasacort which is water based and likely to have less risk nosebleed

## 2017-02-27 NOTE — Assessment & Plan Note (Signed)
With chronic xarelto, stable, cont all tx

## 2017-03-08 DIAGNOSIS — Z1231 Encounter for screening mammogram for malignant neoplasm of breast: Secondary | ICD-10-CM | POA: Diagnosis not present

## 2017-03-08 LAB — HM MAMMOGRAPHY

## 2017-03-10 DIAGNOSIS — Z1212 Encounter for screening for malignant neoplasm of rectum: Secondary | ICD-10-CM | POA: Diagnosis not present

## 2017-03-10 DIAGNOSIS — Z1211 Encounter for screening for malignant neoplasm of colon: Secondary | ICD-10-CM | POA: Diagnosis not present

## 2017-03-15 ENCOUNTER — Other Ambulatory Visit: Payer: Self-pay | Admitting: Internal Medicine

## 2017-03-16 LAB — COLOGUARD: Cologuard: NEGATIVE

## 2017-03-24 DIAGNOSIS — H401112 Primary open-angle glaucoma, right eye, moderate stage: Secondary | ICD-10-CM | POA: Diagnosis not present

## 2017-03-24 DIAGNOSIS — H401122 Primary open-angle glaucoma, left eye, moderate stage: Secondary | ICD-10-CM | POA: Diagnosis not present

## 2017-03-29 DIAGNOSIS — J3489 Other specified disorders of nose and nasal sinuses: Secondary | ICD-10-CM | POA: Insufficient documentation

## 2017-04-02 ENCOUNTER — Other Ambulatory Visit: Payer: Self-pay | Admitting: Internal Medicine

## 2017-04-06 ENCOUNTER — Telehealth: Payer: Self-pay | Admitting: Pharmacist

## 2017-04-06 NOTE — Telephone Encounter (Signed)
Received clearance request from Dr Brien Few with [facility] that pt is scheduled for an upcoming ESI lumbar injection. Pt takes Xarelto for afib with CHADS2 score of 2 (HTN and age). Ok to hold Xarelto for 3 days prior to procedure per protocol. Clearance faxed to [phone].

## 2017-04-07 ENCOUNTER — Other Ambulatory Visit: Payer: Self-pay | Admitting: Internal Medicine

## 2017-04-08 ENCOUNTER — Telehealth: Payer: Self-pay | Admitting: Internal Medicine

## 2017-04-09 MED ORDER — PANTOPRAZOLE SODIUM 40 MG PO TBEC: 40.0000 mg | DELAYED_RELEASE_TABLET | Freq: Every day | ORAL | 3 refills | Status: DC

## 2017-04-09 NOTE — Addendum Note (Signed)
Addended by: Biagio Borg on: 04/09/2017 08:20 AM   Modules accepted: Orders

## 2017-04-09 NOTE — Telephone Encounter (Addendum)
Pt has been informed that is has been sent to the pharmacy.

## 2017-04-09 NOTE — Telephone Encounter (Signed)
Pt said that she is needing a refill on this. She said that she has been taking this for many years. She stopped taking it for a few weeks when she was having some stomach issues but has been back on it with no problems. Can this refill be sent in for her? Please advise.

## 2017-04-09 NOTE — Telephone Encounter (Signed)
Ok done erx 

## 2017-04-09 NOTE — Telephone Encounter (Addendum)
Dr. Jenny Reichmann, I do not see protonix on the pt's med list. Please advise.

## 2017-04-13 DIAGNOSIS — I10 Essential (primary) hypertension: Secondary | ICD-10-CM | POA: Diagnosis not present

## 2017-04-13 DIAGNOSIS — M5417 Radiculopathy, lumbosacral region: Secondary | ICD-10-CM | POA: Diagnosis not present

## 2017-04-13 DIAGNOSIS — M48062 Spinal stenosis, lumbar region with neurogenic claudication: Secondary | ICD-10-CM | POA: Diagnosis not present

## 2017-04-14 ENCOUNTER — Ambulatory Visit (INDEPENDENT_AMBULATORY_CARE_PROVIDER_SITE_OTHER): Payer: Medicare HMO | Admitting: Cardiovascular Disease

## 2017-04-14 ENCOUNTER — Encounter: Payer: Self-pay | Admitting: Cardiovascular Disease

## 2017-04-14 VITALS — BP 126/76 | HR 84 | Ht 66.5 in | Wt 151.5 lb

## 2017-04-14 DIAGNOSIS — I482 Chronic atrial fibrillation, unspecified: Secondary | ICD-10-CM

## 2017-04-14 DIAGNOSIS — I481 Persistent atrial fibrillation: Secondary | ICD-10-CM | POA: Diagnosis not present

## 2017-04-14 DIAGNOSIS — I4819 Other persistent atrial fibrillation: Secondary | ICD-10-CM

## 2017-04-14 NOTE — Patient Instructions (Signed)

## 2017-04-14 NOTE — Progress Notes (Signed)
Cardiology Office Note:    Date:  04/14/2017   ID:  Carolyn Wells, DOB 03/27/1941, MRN [id number]  PCP:  Biagio Borg, MD  Cardiologist:    Liam Rogers   Electrophysiologist:  n/a  Referring MD:  Dr. Scarlette Shorts  Problem List  1. Paroxysmal atrial fib  2. Hyperlipidemia 3. Essential hypertension 4. CKD - donated a kidney to her husband   Chief Complaint  Patient presents with  . Follow-up    HTN, Atrial fib    History of Present Illness:     Carolyn Wells is a 76 y.o. female with a hx of HTN, HL, atrial fib  CKD stage 3 (solitary kidney).   CHADS2-VASc=4 (female, age 62, HTN).  This patients CHA2DS2-VASc Score and unadjusted Ischemic Stroke Rate (% per year) is equal to 4.8 % stroke rate/year from a score of 4 Above score calculated as 1 point each if present [CHF, HTN, DM, Vascular=MI/PAD/Aortic Plaque, Age if 65-74, or Female] Above score calculated as 2 points each if present [Age > 75, or Stroke/TIA/TE]  Mar 24, 2016:  Feels very well. No symptoms.     still active.   Exercises regularly  Checks BP regularly ,  Her reading are all very good.  Is on metoprolol for rate control and mild HTN.    Nov. 20, 2017:  [name] is doing great She remains asymptomatic. She is still in atrial ablation but cannot tell that her heart beat is irregular.  April 14, 2017:  Trivia is doing well Held her Xarelto for 7 days prior to back injection Staying active.   Rides her stationary bike     Past Medical History:  Diagnosis Date  . ABDOMINAL INCISIONAL HERNIA 08/13/2009   s/p repair  . Allergic rhinitis, cause unspecified 09/28/2011  . BRADYCARDIA 09/26/2010  . CKD (chronic kidney disease), stage III 02/26/2015   Solitary kidney  . COLONIC POLYPS, HX OF 08/25/2007  . DIVERTICULOSIS, COLON 08/25/2007  . GASTROESOPHAGEAL REFLUX DISEASE 08/04/2007  . HYPERLIPIDEMIA 09/27/2010   not on Rx therapy  . HYPERTENSION 08/06/2007  . Hypothyroidism   . INTERNAL HEMORRHOIDS  08/04/2007  . LOW BACK PAIN 08/06/2007   scoliosis; DJD; gets injections  . OSTEOPENIA 08/04/2007  . PAF (paroxysmal atrial fibrillation) ([place])    a.  Echo 2/17:  Vigorous LVF, EF 65-70%, no RWMA, Ao sclerosis, trivial AI, trivial MR, trivial TR, PASP 23 mmHg  . Solitary kidney, acquired 09/28/2011   donated R kidney to husband    Past Surgical History:  Procedure Laterality Date  . LAMINECTOMY  1999   L4-5/lumbar  . NEPHRECTOMY     Right  . s/o incisional hernia repair     RUQ  . TONSILLECTOMY      Current Medications: Outpatient Medications Prior to Visit  Medication Sig Dispense Refill  . Ascorbic Acid (VITAMIN C) 1000 MG tablet Take 1,000 mg by mouth daily.    . Calcium Citrate (CITRACAL PO) Take 2 tablets by mouth daily.     . diclofenac sodium (VOLTAREN) 1 % GEL Apply 1 application topically daily as needed (FOR BACK PAIN).     [name] levothyroxine (SYNTHROID, LEVOTHROID) 25 MCG tablet TAKE ONE TABLET BY MOUTH ONCE DAILY 90 tablet 1  . LUMIGAN 0.01 % SOLN Place 1 drop into both eyes at bedtime.     . metoprolol tartrate (LOPRESSOR) 25 MG tablet Take 1 tablet (25 mg total) by mouth 2 (two) times daily. 180 tablet 3  .  Omega-3 Krill Oil 300 MG CAPS Take 1 capsule by mouth.    . pantoprazole (PROTONIX) 40 MG tablet Take 1 tablet (40 mg total) by mouth daily. 90 tablet 3  . Probiotic Product (VSL#3) CAPS Take 2 capsules by mouth daily. 28 capsule 0  . Rivaroxaban (XARELTO) 15 MG TABS tablet Take 1 tablet (15 mg total) by mouth daily with supper. 90 tablet 3  . TURMERIC PO Take 2 tablets by mouth once  Daily Patient not sure of dosage.     No facility-administered medications prior to visit.      Allergies:   Hydrocodone   Social History   Social History  . Marital status: Married    Spouse name: [name]  . Number of children: 2  . Years of education: [name]   Occupational History  . 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 Smoking status:  Never Smoker  . Smokeless tobacco: Never Used  . Alcohol use No  . Drug use: No  . Sexual activity: Not Asked   Other Topics Concern  . None   Social History Narrative   Husband disabled, renal failure, DM, HTN.   Originally from [place], [place] >> came to [place] 1966 (husband in [employer])        Family History:  The patient's family history includes Alzheimer's disease in her brother; Coronary artery disease (age of onset: 49) in her mother; Diabetes in her mother and other; Heart attack (age of onset: 66) in her father.   ROS:   Please see the history of present illness.    ROSAll other systems reviewed and are negative.   Physical Exam:    VS:  BP 126/76   Pulse 84   Ht 5' 6.5" (1.689 m)   Wt 151 lb 8 oz (68.7 kg)   SpO2 97%   BMI 24.09 kg/m    GEN: Well nourished, well developed, in no acute distress  HEENT: normal  Neck: no JVD, no masses Cardiac: irreg. Irreg. Normal S1/S2   no murmurs,   no edema  Respiratory:  clear to auscultation bilaterally; no wheezing, rhonchi or rales GI: soft, nontender  MS: no deformity or atrophy  Skin: warm and dry  Neuro:  no focal deficits  Psych: Alert and oriented x 3, normal affect  Wt Readings from Last 3 Encounters:  04/14/17 151 lb 8 oz (68.7 kg)  02/26/17 152 lb (68.9 kg)  02/17/17 155 lb 6.4 oz (70.5 kg)      Studies/Labs Reviewed:     EKG:  EKG is  ordered today.  April 14, 2017:  Atrial fib with HR of 84  Recent Labs: 08/31/2016: ALT 21; BUN 19; Creatinine, Ser 1.33; Hemoglobin 13.7; Platelets 210.0; Potassium 5.2; Sodium 141; TSH 2.54   Recent Lipid Panel    Component Value Date/Time   CHOL 181 08/31/2016 1303   TRIG 179.0 (H) 08/31/2016 1303   HDL 69.10 08/31/2016 1303   CHOLHDL 3 08/31/2016 1303   VLDL 35.8 08/31/2016 1303   LDLCALC 76 08/31/2016 1303   LDLDIRECT 117.7 09/26/2013 1420     Assessment / PLAN:      1. PAF - she remains  in atrial fib.  Is completely asymptomatic. At this point we will  continue with rate control and Xarelto   2. HTN - BP controlled.   3. CKD - She has a solitary kidney due to transplant donation to her husband years ago.  Check BMET in  6 weeks.  5. Hyperlipidemia:   Labs were     [name], MD  04/14/2017 9:48 AM    [facility] [address], [place], [place]  [address] Phone: [phone]; Fax: [phone]

## 2017-05-03 DIAGNOSIS — Z01419 Encounter for gynecological examination (general) (routine) without abnormal findings: Secondary | ICD-10-CM | POA: Diagnosis not present

## 2017-05-03 DIAGNOSIS — N958 Other specified menopausal and perimenopausal disorders: Secondary | ICD-10-CM | POA: Diagnosis not present

## 2017-05-03 DIAGNOSIS — M8588 Other specified disorders of bone density and structure, other site: Secondary | ICD-10-CM | POA: Diagnosis not present

## 2017-05-03 DIAGNOSIS — Z6824 Body mass index (BMI) 24.0-24.9, adult: Secondary | ICD-10-CM | POA: Diagnosis not present

## 2017-05-05 ENCOUNTER — Ambulatory Visit (INDEPENDENT_AMBULATORY_CARE_PROVIDER_SITE_OTHER): Payer: Medicare HMO | Admitting: *Deleted

## 2017-05-05 VITALS — BP 124/62 | HR 60 | Resp 20 | Ht 67.0 in | Wt 150.0 lb

## 2017-05-05 DIAGNOSIS — Z0000 Encounter for general adult medical examination without abnormal findings: Secondary | ICD-10-CM

## 2017-05-05 NOTE — Progress Notes (Signed)
Pre visit review using our clinic review tool, if applicable. No additional management support is needed unless otherwise documented below in the visit note. 

## 2017-05-05 NOTE — Patient Instructions (Addendum)
Please Have Dr. Nori Riis OBGYN send records to Dr. Jenny Reichmann.   Continue doing brain stimulating activities (puzzles, reading, adult coloring books, staying active) to keep memory sharp.   Continue to eat heart healthy diet (full of fruits, vegetables, whole grains, lean protein, water--limit salt, fat, and sugar intake) and increase physical activity as tolerated.   Carolyn Wells , Thank you for taking time to come for your Medicare Wellness Visit. I appreciate your ongoing commitment to your health goals. Please review the following plan we discussed and let me know if I can assist you in the future.   These are the goals we discussed: Goals    . Exercise 3x per week (30 min per time)          Would like to start back exercises; will try pilates     . Maintian current health status          Continue to be as active and independent as possible    . patient          Just to maintain; keep exercising and keep taking care of yourself  Also helps another Sr. Citizen;        This is a list of the screening recommended for you and due dates:  Health Maintenance  Topic Date Due  . Flu Shot  06/09/2017  . Tetanus Vaccine  08/14/2019  . DEXA scan (bone density measurement)  Completed  . Pneumonia vaccines  Completed

## 2017-05-05 NOTE — Progress Notes (Addendum)
Subjective:   Carolyn Wells is a 76 y.o. female who presents for Medicare Annual (Subsequent) preventive examination.  Review of Systems:  No ROS.  Medicare Wellness Visit. Additional risk factors are reflected in the social history.  Cardiac Risk Factors include: advanced age (>51men, >20 women);dyslipidemia;hypertension Sleep patterns: feels rested on waking, gets up 1 tiimes nightly to void and sleeps 7-8 hours nightly.   Home Safety/Smoke Alarms: Feels safe in home. Smoke alarms in place.  Living environment; residence and Firearm Safety: 2-story house, no firearms.Lives alone, no needs for DME, good support from family and friends. Seat Belt Safety/Bike Helmet: Wears seat belt.   Counseling:   Eye Exam- appointment every 6 months Dental- appointment every 6 months  Female:   Pap- [name]     Mammo-  Last 04/09/17, patient will request results to be sent to PCP     Dexa scan-  Last 04/09/17, patient will request results to be sent to PCP          CCS- Cologuard 03/16/17, negative     Objective:     Vitals: BP 124/62   Pulse 60   Resp 20   Ht 5\' 7"  (1.702 m)   Wt 150 lb (68 kg)   SpO2 99%   BMI 23.49 kg/m   Body mass index is 23.49 kg/m.   Tobacco History  Smoking Status  . Never Smoker  Smokeless Tobacco  . Never Used     Counseling given: Not Answered   Past Medical History:  Diagnosis Date  . ABDOMINAL INCISIONAL HERNIA 08/13/2009   s/p repair  . Allergic rhinitis, cause unspecified 09/28/2011  . BRADYCARDIA 09/26/2010  . CKD (chronic kidney disease), stage III 02/26/2015   Solitary kidney  . COLONIC POLYPS, HX OF 08/25/2007  . DIVERTICULOSIS, COLON 08/25/2007  . GASTROESOPHAGEAL REFLUX DISEASE 08/04/2007  . HYPERLIPIDEMIA 09/27/2010   not on Rx therapy  . HYPERTENSION 08/06/2007  . Hypothyroidism   . INTERNAL HEMORRHOIDS 08/04/2007  . LOW BACK PAIN 08/06/2007   scoliosis; DJD; gets injections  . OSTEOPENIA 08/04/2007  . PAF (paroxysmal atrial  fibrillation) ([place])    a.  Echo 2/17:  Vigorous LVF, EF 65-70%, no RWMA, Ao sclerosis, trivial AI, trivial MR, trivial TR, PASP 23 mmHg  . Solitary kidney, acquired 09/28/2011   donated R kidney to husband   Past Surgical History:  Procedure Laterality Date  . LAMINECTOMY  1999   L4-5/lumbar  . NEPHRECTOMY     Right  . s/o incisional hernia repair     RUQ  . TONSILLECTOMY     Family History  Problem Relation Age of Onset  . Coronary artery disease Mother 69       1st degree relative   . Diabetes Mother   . Heart attack Father 62  . Diabetes Other        1st degree relative  . Alzheimer's disease Brother    History  Sexual Activity  . Sexual activity: Not on file    Outpatient Encounter Prescriptions as of 05/05/2017  Medication Sig  . Ascorbic Acid (VITAMIN C) 1000 MG tablet Take 1,000 mg by mouth daily.  . Calcium Citrate (CITRACAL PO) Take 2 tablets by mouth daily.   . diclofenac sodium (VOLTAREN) 1 % GEL Apply 1 application topically daily as needed (FOR BACK PAIN).   [name] levothyroxine (SYNTHROID, LEVOTHROID) 25 MCG tablet TAKE ONE TABLET BY MOUTH ONCE DAILY  . LUMIGAN 0.01 % SOLN Place 1 drop into both eyes  at bedtime.   . metoprolol tartrate (LOPRESSOR) 25 MG tablet Take 1 tablet (25 mg total) by mouth 2 (two) times daily.  . Omega-3 Krill Oil 300 MG CAPS Take 1 capsule by mouth.  . pantoprazole (PROTONIX) 40 MG tablet Take 1 tablet (40 mg total) by mouth daily.  . Probiotic Product (VSL#3) CAPS Take 2 capsules by mouth daily.  . Rivaroxaban (XARELTO) 15 MG TABS tablet Take 1 tablet (15 mg total) by mouth daily with supper.  . TURMERIC PO Take 2 tablets by mouth once  Daily Patient not sure of dosage.   No facility-administered encounter medications on file as of 05/05/2017.     Activities of Daily Living In your present state of health, do you have any difficulty performing the following activities: 05/05/2017  Hearing? N  Vision? N  Difficulty concentrating or  making decisions? N  Walking or climbing stairs? N  Dressing or bathing? N  Doing errands, shopping? N  Preparing Food and eating ? N  Using the Toilet? N  In the past six months, have you accidently leaked urine? N  Do you have problems with loss of bowel control? N  Managing your Medications? N  Managing your Finances? N  Housekeeping or managing your Housekeeping? N  Some recent data might be hidden    Patient Care Team: Biagio Borg, MD as PCP - Gwenevere Abbot, Docia Chuck, MD as Consulting Physician (Gastroenterology) Nahser, Wonda Cheng, MD as Consulting Physician (Cardiology)    Assessment:    Physical assessment deferred to PCP.  Exercise Activities and Dietary recommendations Current Exercise Habits: Home exercise routine, Type of exercise: Other - see comments (stationary bike), Time (Minutes): 35, Frequency (Times/Week): 6, Weekly Exercise (Minutes/Week): 210, Intensity: Moderate, Exercise limited by: None identified  Diet (meal preparation, eat out, water intake, caffeinated beverages, dairy products, fruits and vegetables): in general, a "healthy" diet  , well balanced, low fat/ cholesterol, low salt, eats a variety of fruits and vegetables daily, limits salt, fat/cholesterol, sugar, caffeine, drinks 6-8 glasses of water daily.     Goals    . Exercise 3x per week (30 min per time)          Would like to start back exercises; will try pilates     . Maintian current health status          Continue to be as active and independent as possible    . patient          Just to maintain; keep exercising and keep taking care of yourself  Also helps another Sr. Citizen;       Fall Risk Fall Risk  05/05/2017 08/28/2016 05/04/2016 08/28/2015 05/06/2015  Falls in the past year? No No No No Yes  Number falls in past yr: - - - - 1  Injury with Fall? - - - - No  Follow up - - - - Education provided   Depression Screen PHQ 2/9 Scores 05/05/2017 08/28/2016 05/04/2016 08/28/2015    PHQ - 2 Score 0 0 0 1     Cognitive Function MMSE - Mini Mental State Exam 05/04/2016  Not completed: (No Data)        Immunization History  Administered Date(s) Administered  . Influenza Split 09/28/2011, 09/28/2012  . Influenza Whole 09/10/2006, 08/25/2007, 08/30/2008, 08/13/2009, 09/26/2010  . Influenza, Seasonal, Injecte, Preservative Fre 09/01/2013  . Influenza,inj,Quad PF,36+ Mos 08/28/2015  . Pneumococcal Conjugate-13 09/28/2013  . Pneumococcal Polysaccharide-23 08/09/2005, 08/10/2005, 03/27/2011  . Td 11/09/1989, 08/13/2009  .  Zoster 07/28/2006   Screening Tests Health Maintenance  Topic Date Due  . INFLUENZA VACCINE  06/09/2017  . TETANUS/TDAP  08/14/2019  . DEXA SCAN  Completed  . PNA vac Low Risk Adult  Completed      Plan:    Please Have Dr. Nori Riis OBGYN send records to Dr. Jenny Reichmann.   Continue doing brain stimulating activities (puzzles, reading, adult coloring books, staying active) to keep memory sharp.   Continue to eat heart healthy diet (full of fruits, vegetables, whole grains, lean protein, water--limit salt, fat, and sugar intake) and increase physical activity as tolerated.  I have personally reviewed and noted the following in the patient's chart:   . Medical and social history . Use of alcohol, tobacco or illicit drugs  . Current medications and supplements . Functional ability and status . Nutritional status . Physical activity . Advanced directives . List of other physicians . Vitals . Screenings to include cognitive, depression, and falls . Referrals and appointments  In addition, I have reviewed and discussed with patient certain preventive protocols, quality metrics, and best practice recommendations. A written personalized care plan for preventive services as well as general preventive health recommendations were provided to patient.     Michiel Cowboy, RN  05/05/2017  Medical screening examination/treatment/procedure(s) were performed by  non-physician practitioner and as supervising physician I was immediately available for consultation/collaboration. I agree with above. Cathlean Cower, MD

## 2017-07-07 DIAGNOSIS — J3489 Other specified disorders of nose and nasal sinuses: Secondary | ICD-10-CM | POA: Diagnosis not present

## 2017-07-07 DIAGNOSIS — J342 Deviated nasal septum: Secondary | ICD-10-CM | POA: Insufficient documentation

## 2017-07-14 DIAGNOSIS — J342 Deviated nasal septum: Secondary | ICD-10-CM | POA: Diagnosis not present

## 2017-07-14 DIAGNOSIS — J3489 Other specified disorders of nose and nasal sinuses: Secondary | ICD-10-CM | POA: Diagnosis not present

## 2017-08-02 DIAGNOSIS — H401112 Primary open-angle glaucoma, right eye, moderate stage: Secondary | ICD-10-CM | POA: Diagnosis not present

## 2017-08-02 DIAGNOSIS — H401122 Primary open-angle glaucoma, left eye, moderate stage: Secondary | ICD-10-CM | POA: Diagnosis not present

## 2017-08-02 DIAGNOSIS — H2513 Age-related nuclear cataract, bilateral: Secondary | ICD-10-CM | POA: Diagnosis not present

## 2017-08-02 DIAGNOSIS — H5213 Myopia, bilateral: Secondary | ICD-10-CM | POA: Diagnosis not present

## 2017-08-17 ENCOUNTER — Other Ambulatory Visit: Payer: Self-pay | Admitting: Internal Medicine

## 2017-08-27 ENCOUNTER — Other Ambulatory Visit (INDEPENDENT_AMBULATORY_CARE_PROVIDER_SITE_OTHER): Payer: Medicare HMO

## 2017-08-27 ENCOUNTER — Ambulatory Visit (INDEPENDENT_AMBULATORY_CARE_PROVIDER_SITE_OTHER): Payer: Medicare HMO | Admitting: Internal Medicine

## 2017-08-27 ENCOUNTER — Other Ambulatory Visit: Payer: Self-pay | Admitting: Internal Medicine

## 2017-08-27 ENCOUNTER — Encounter: Payer: Self-pay | Admitting: Internal Medicine

## 2017-08-27 VITALS — BP 126/78 | HR 63 | Temp 97.7°F | Ht 67.0 in | Wt 152.0 lb

## 2017-08-27 DIAGNOSIS — N183 Chronic kidney disease, stage 3 unspecified: Secondary | ICD-10-CM

## 2017-08-27 DIAGNOSIS — I10 Essential (primary) hypertension: Secondary | ICD-10-CM

## 2017-08-27 DIAGNOSIS — J310 Chronic rhinitis: Secondary | ICD-10-CM

## 2017-08-27 DIAGNOSIS — Z23 Encounter for immunization: Secondary | ICD-10-CM | POA: Diagnosis not present

## 2017-08-27 DIAGNOSIS — Z0000 Encounter for general adult medical examination without abnormal findings: Secondary | ICD-10-CM

## 2017-08-27 DIAGNOSIS — Q600 Renal agenesis, unilateral: Secondary | ICD-10-CM

## 2017-08-27 DIAGNOSIS — IMO0002 Reserved for concepts with insufficient information to code with codable children: Secondary | ICD-10-CM

## 2017-08-27 LAB — HEPATIC FUNCTION PANEL
ALT: 16 U/L (ref 0–35)
AST: 15 U/L (ref 0–37)
Albumin: 4.3 g/dL (ref 3.5–5.2)
Alkaline Phosphatase: 53 U/L (ref 39–117)
BILIRUBIN DIRECT: 0.1 mg/dL (ref 0.0–0.3)
BILIRUBIN TOTAL: 0.7 mg/dL (ref 0.2–1.2)
Total Protein: 7.1 g/dL (ref 6.0–8.3)

## 2017-08-27 LAB — URINALYSIS, ROUTINE W REFLEX MICROSCOPIC
Bilirubin Urine: NEGATIVE
HGB URINE DIPSTICK: NEGATIVE
Ketones, ur: NEGATIVE
Leukocytes, UA: NEGATIVE
NITRITE: NEGATIVE
Specific Gravity, Urine: 1.025 (ref 1.000–1.030)
Total Protein, Urine: NEGATIVE
Urine Glucose: NEGATIVE
Urobilinogen, UA: 0.2 (ref 0.0–1.0)
pH: 5.5 (ref 5.0–8.0)

## 2017-08-27 LAB — LIPID PANEL
CHOL/HDL RATIO: 3
CHOLESTEROL: 228 mg/dL — AB (ref 0–200)
HDL: 72.3 mg/dL (ref >39.00)
LDL Cholesterol: 117 mg/dL — ABNORMAL HIGH (ref 0–99)
NONHDL: 155.53
Triglycerides: 194 mg/dL — ABNORMAL HIGH (ref 0.0–149.0)
VLDL: 38.8 mg/dL (ref 0.0–40.0)

## 2017-08-27 LAB — BASIC METABOLIC PANEL
BUN: 20 mg/dL (ref 6–23)
CO2: 30 mEq/L (ref 19–32)
Calcium: 10.1 mg/dL (ref 8.4–10.5)
Chloride: 100 mEq/L (ref 96–112)
Creatinine, Ser: 1.58 mg/dL — ABNORMAL HIGH (ref 0.40–1.20)
GFR: 33.68 mL/min — AB (ref >60.00)
Glucose, Bld: 97 mg/dL (ref 70–99)
POTASSIUM: 4.8 meq/L (ref 3.5–5.1)
SODIUM: 139 meq/L (ref 135–145)

## 2017-08-27 LAB — CBC WITH DIFFERENTIAL/PLATELET
BASOS PCT: 0.7 % (ref 0.0–3.0)
Basophils Absolute: 0.1 10*3/uL (ref 0.0–0.1)
EOS PCT: 0.9 % (ref 0.0–5.0)
Eosinophils Absolute: 0.1 10*3/uL (ref 0.0–0.7)
HEMATOCRIT: 43.3 % (ref 36.0–46.0)
Hemoglobin: 14.4 g/dL (ref 12.0–15.0)
Lymphocytes Relative: 19.6 % (ref 12.0–46.0)
Lymphs Abs: 1.7 10*3/uL (ref 0.7–4.0)
MCHC: 33.2 g/dL (ref 30.0–36.0)
MCV: 98.5 fl (ref 78.0–100.0)
MONO ABS: 0.8 10*3/uL (ref 0.1–1.0)
Monocytes Relative: 9.5 % (ref 3.0–12.0)
NEUTROS ABS: 5.9 10*3/uL (ref 1.4–7.7)
Neutrophils Relative %: 69.3 % (ref 43.0–77.0)
Platelets: 229 10*3/uL (ref 150.0–400.0)
RBC: 4.39 Mil/uL (ref 3.87–5.11)
RDW: 14.1 % (ref 11.5–15.5)
WBC: 8.5 10*3/uL (ref 4.0–10.5)

## 2017-08-27 LAB — TSH: TSH: 3.68 u[IU]/mL (ref 0.35–4.50)

## 2017-08-27 NOTE — Assessment & Plan Note (Signed)
stable overall by history and exam, recent data reviewed with pt, and pt to continue medical treatment as before,  to f/u any worsening symptoms or concerns BP Readings from Last 3 Encounters:  08/27/17 126/78  05/05/17 124/62  04/14/17 126/76

## 2017-08-27 NOTE — Assessment & Plan Note (Signed)
With solitary kidney, for f/u lab, consider renal referral

## 2017-08-27 NOTE — Patient Instructions (Addendum)
You had the flu shot today  Please continue all other medications as before, and refills have been done if requested.  Please have the pharmacy call with any other refills you may need.  Please continue your efforts at being more active, low cholesterol diet, and weight control.  You are otherwise up to date with prevention measures today.  Please keep your appointments with your specialists as you may have planned  Please go to the LAB in the Basement (turn left off the elevator) for the tests to be done today  You will be contacted by phone if any changes need to be made immediately.  Otherwise, you will receive a letter about your results with an explanation, but please check with MyChart first.  Please remember to sign up for MyChart if you have not done so, as this will be important to you in the future with finding out test results, communicating by private email, and scheduling acute appointments online when needed.  If you have Medicare related insurance (such as traditional Medicare, [employer] or [employer], or similar), Please make an appointment at the [facility] with Sharee Pimple, the [employer], for your Wellness Visit in this office, which is a benefit with your insurance.  Please return in 1 year for your yearly visit, or sooner if needed, with Lab testing done 3-5 days before

## 2017-08-27 NOTE — Assessment & Plan Note (Signed)
Chronic persistent, encourage otc nasacort asd,  to f/u any worsening symptoms or concerns

## 2017-08-27 NOTE — Assessment & Plan Note (Signed)

## 2017-08-27 NOTE — Progress Notes (Signed)
Subjective:    Patient ID: Carolyn Wells, female    DOB: 06/20/1941, 76 y.o.   MRN: [id number]  HPI  Here for wellness and f/u;  Overall doing ok;  Pt denies Chest pain, worsening SOB, DOE, wheezing, orthopnea, PND, worsening LE edema, palpitations, dizziness or syncope.  Pt denies neurological change such as new headache, facial or extremity weakness.  Pt denies polydipsia, polyuria, or low sugar symptoms. Pt states overall good compliance with treatment and medications, good tolerability, and has been trying to follow appropriate diet.  Pt denies worsening depressive symptoms, suicidal ideation or panic. No fever, night sweats, wt loss, loss of appetite, or other constitutional symptoms.  Pt states good ability with ADL's, has low fall risk, home safety reviewed and adequate, no other significant changes in hearing or vision, and only occasionally active with exercise. Pt continues to have recurring LBP without change in severity, bowel or bladder change, fever, wt loss,  worsening LE pain/numbness/weakness, gait change or falls. Past Medical History:  Diagnosis Date  . ABDOMINAL INCISIONAL HERNIA 08/13/2009   s/p repair  . Allergic rhinitis, cause unspecified 09/28/2011  . BRADYCARDIA 09/26/2010  . CKD (chronic kidney disease), stage III ([place]) 02/26/2015   Solitary kidney  . COLONIC POLYPS, HX OF 08/25/2007  . DIVERTICULOSIS, COLON 08/25/2007  . GASTROESOPHAGEAL REFLUX DISEASE 08/04/2007  . HYPERLIPIDEMIA 09/27/2010   not on Rx therapy  . HYPERTENSION 08/06/2007  . Hypothyroidism   . INTERNAL HEMORRHOIDS 08/04/2007  . LOW BACK PAIN 08/06/2007   scoliosis; DJD; gets injections  . OSTEOPENIA 08/04/2007  . PAF (paroxysmal atrial fibrillation) ([place])    a.  Echo 2/17:  Vigorous LVF, EF 65-70%, no RWMA, Ao sclerosis, trivial AI, trivial MR, trivial TR, PASP 23 mmHg  . Solitary kidney, acquired 09/28/2011   donated R kidney to husband   Past Surgical History:  Procedure Laterality Date  .  LAMINECTOMY  1999   L4-5/lumbar  . NEPHRECTOMY     Right  . s/o incisional hernia repair     RUQ  . TONSILLECTOMY      reports that she has never smoked. She has never used smokeless tobacco. She reports that she does not drink alcohol or use drugs. family history includes Alzheimer's disease in her brother; Coronary artery disease (age of onset: 33) in her mother; Diabetes in her mother and other; Heart attack (age of onset: 9) in her father. Allergies  Allergen Reactions  . Hydrocodone Nausea Only and Other (See Comments)    lethargy   Current Outpatient Prescriptions on File Prior to Visit  Medication Sig Dispense Refill  . Ascorbic Acid (VITAMIN C) 1000 MG tablet Take 1,000 mg by mouth daily.    . Calcium Citrate (CITRACAL PO) Take 2 tablets by mouth daily.     . diclofenac sodium (VOLTAREN) 1 % GEL Apply 1 application topically daily as needed (FOR BACK PAIN).     [name] levothyroxine (SYNTHROID, LEVOTHROID) 25 MCG tablet TAKE 1 TABLET BY MOUTH ONCE DAILY 90 tablet 0  . LUMIGAN 0.01 % SOLN Place 1 drop into both eyes at bedtime.     . metoprolol tartrate (LOPRESSOR) 25 MG tablet Take 1 tablet (25 mg total) by mouth 2 (two) times daily. 180 tablet 3  . Omega-3 Krill Oil 300 MG CAPS Take 1 capsule by mouth.    . pantoprazole (PROTONIX) 40 MG tablet Take 1 tablet (40 mg total) by mouth daily. 90 tablet 3  . Probiotic Product (VSL#3) CAPS Take 2  capsules by mouth daily. 28 capsule 0  . Rivaroxaban (XARELTO) 15 MG TABS tablet Take 1 tablet (15 mg total) by mouth daily with supper. 90 tablet 3  . TURMERIC PO Take 2 tablets by mouth once  Daily Patient not sure of dosage.     No current facility-administered medications on file prior to visit.    Review of Systems Constitutional: Negative for other unusual diaphoresis, sweats, appetite or weight changes HENT: Negative for other worsening hearing loss, ear pain, facial swelling, mouth sores or neck stiffness.   Eyes: Negative for other  worsening pain, redness or other visual disturbance.  Respiratory: Negative for other stridor or swelling Cardiovascular: Negative for other palpitations or other chest pain  Gastrointestinal: Negative for worsening diarrhea or loose stools, blood in stool, distention or other pain Genitourinary: Negative for hematuria, flank pain or other change in urine volume.  Musculoskeletal: Negative for myalgias or other joint swelling.  Skin: Negative for other color change, or other wound or worsening drainage.  Neurological: Negative for other syncope or numbness. Hematological: Negative for other adenopathy or swelling Psychiatric/Behavioral: Negative for hallucinations, other worsening agitation, SI, self-injury, or new decreased concentration All other system neg per pt    Objective:   Physical Exam BP 126/78   Pulse 63   Temp 97.7 F (36.5 C) (Oral)   Ht 5\' 7"  (1.702 m)   Wt 152 lb (68.9 kg)   SpO2 99%   BMI 23.81 kg/m  VS noted,  Constitutional: Pt is oriented to person, place, and time. Appears well-developed and well-nourished, in no significant distress and comfortable Head: Normocephalic and atraumatic  Eyes: Conjunctivae and EOM are normal. Pupils are equal, round, and reactive to light Right Ear: External ear normal without discharge Left Ear: External ear normal without discharge Nose: Nose without discharge or deformity Mouth/Throat: Oropharynx is without other ulcerations and moist  Neck: Normal range of motion. Neck supple. No JVD present. No tracheal deviation present or significant neck LA or mass Cardiovascular: Normal rate, regular rhythm, normal heart sounds and intact distal pulses.   Pulmonary/Chest: WOB normal and breath sounds without rales or wheezing  Abdominal: Soft. Bowel sounds are normal. NT. No HSM  Musculoskeletal: Normal range of motion. Exhibits no edema Lymphadenopathy: Has no other cervical adenopathy.  Neurological: Pt is alert and oriented to  person, place, and time. Pt has normal reflexes. No cranial nerve deficit. Motor grossly intact, Gait intact Skin: Skin is warm and dry. No rash noted or new ulcerations Psychiatric:  Has normal mood and affect. Behavior is normal without agitation No other exam findings Lab Results  Component Value Date   WBC 8.5 08/27/2017   HGB 14.4 08/27/2017   HCT 43.3 08/27/2017   PLT 229.0 08/27/2017   GLUCOSE 97 08/27/2017   CHOL 228 (H) 08/27/2017   TRIG 194.0 (H) 08/27/2017   HDL 72.30 08/27/2017   LDLDIRECT 117.7 09/26/2013   LDLCALC 117 (H) 08/27/2017   ALT 16 08/27/2017   AST 15 08/27/2017   NA 139 08/27/2017   K 4.8 08/27/2017   CL 100 08/27/2017   CREATININE 1.58 (H) 08/27/2017   BUN 20 08/27/2017   CO2 30 08/27/2017   TSH 3.68 08/27/2017   INR 1.03 10/22/2009       Assessment & Plan:

## 2017-08-30 ENCOUNTER — Telehealth: Payer: Self-pay

## 2017-08-30 NOTE — Telephone Encounter (Signed)
-----   Message from Biagio Borg, MD sent at 08/27/2017  5:37 PM EDT ----- Letter sent, cont same tx except  The test results show that your current treatment is OK, except the kidney function is slightly worse.  You have the one kidney.  I think we should refer you to Nephrology (kidney doctors) to follow this more closely.     Carolyn Wells to please inform pt, I will do referral

## 2017-08-30 NOTE — Telephone Encounter (Signed)
Called pt, LVM.   

## 2017-08-31 ENCOUNTER — Telehealth: Payer: Self-pay

## 2017-08-31 NOTE — Telephone Encounter (Signed)
Pt called back but you were already gone. (We had a very high call volume and it took her a while to get through) I read her what Dr Jenny Reichmann said. She expressed understanding but I told her that you may be able to discuss things further with her. She said that she should be home all day tomorrow.

## 2017-08-31 NOTE — Telephone Encounter (Signed)
Pt returned your call to discuss her labs with you. She would like a call back when you are available. (She said she would be home until around 11:00)

## 2017-08-31 NOTE — Telephone Encounter (Signed)
Patient was transferred to me to discuss labs, the call was dropped, I have tried to call patient back several times, it just rings and rings---I will try to call patient again tomorrow

## 2017-08-31 NOTE — Telephone Encounter (Signed)
Returned patient call, no answer, left VM.

## 2017-09-01 NOTE — Telephone Encounter (Signed)
I have explained labs further, patient would like a referral to nephrology---note from labs sent to dr Jenny Reichmann to place referral, patient advised that nephrology will call her to schedule appt

## 2017-09-23 ENCOUNTER — Other Ambulatory Visit: Payer: Self-pay | Admitting: Cardiovascular Disease

## 2017-09-23 NOTE — Telephone Encounter (Signed)
Pt last saw Dr Acie Fredrickson on 04/14/17, last labs on 08/27/17 Creat 1.58, age 76, weight 68.9kg, CrCl 32.48, based on CrCl pt is on appropriate dosage of Xarelto 15mg  QD.  Will refill rx.

## 2017-09-28 DIAGNOSIS — M5417 Radiculopathy, lumbosacral region: Secondary | ICD-10-CM | POA: Diagnosis not present

## 2017-09-28 DIAGNOSIS — M48062 Spinal stenosis, lumbar region with neurogenic claudication: Secondary | ICD-10-CM | POA: Diagnosis not present

## 2017-09-29 ENCOUNTER — Other Ambulatory Visit: Payer: Self-pay | Admitting: Internal Medicine

## 2017-10-04 ENCOUNTER — Telehealth: Payer: Self-pay | Admitting: *Deleted

## 2017-10-04 ENCOUNTER — Telehealth: Payer: Self-pay

## 2017-10-04 MED ORDER — PIMECROLIMUS 1 % EX CREA: TOPICAL_CREAM | Freq: Two times a day (BID) | CUTANEOUS | 0 refills | Status: DC

## 2017-10-04 NOTE — Telephone Encounter (Signed)
Approved  11/07/16-11/08/17 and also covered 10/28/17-11/08/18.  Both determinations sent to scan

## 2017-10-04 NOTE — Telephone Encounter (Signed)
Rec'd call from pharmacist @ [facility] stating the Elidel 1% cream that was sent in on 09/29/17 30g is on back order. They do have the 60g requesting to change to 60g tube. Resent rx for 60 grams...Johny Chess

## 2017-10-18 ENCOUNTER — Ambulatory Visit: Payer: Medicare HMO | Admitting: Cardiovascular Disease

## 2017-10-22 ENCOUNTER — Other Ambulatory Visit: Payer: Self-pay | Admitting: Cardiovascular Disease

## 2017-12-02 DIAGNOSIS — H5032 Intermittent alternating esotropia: Secondary | ICD-10-CM | POA: Diagnosis not present

## 2017-12-04 ENCOUNTER — Other Ambulatory Visit: Payer: Self-pay | Admitting: Internal Medicine

## 2017-12-08 DIAGNOSIS — I129 Hypertensive chronic kidney disease with stage 1 through stage 4 chronic kidney disease, or unspecified chronic kidney disease: Secondary | ICD-10-CM | POA: Diagnosis not present

## 2017-12-08 DIAGNOSIS — N183 Chronic kidney disease, stage 3 (moderate): Secondary | ICD-10-CM | POA: Diagnosis not present

## 2017-12-08 DIAGNOSIS — E785 Hyperlipidemia, unspecified: Secondary | ICD-10-CM | POA: Diagnosis not present

## 2017-12-08 DIAGNOSIS — R809 Proteinuria, unspecified: Secondary | ICD-10-CM | POA: Diagnosis not present

## 2017-12-08 DIAGNOSIS — Q600 Renal agenesis, unilateral: Secondary | ICD-10-CM | POA: Diagnosis not present

## 2017-12-08 DIAGNOSIS — K219 Gastro-esophageal reflux disease without esophagitis: Secondary | ICD-10-CM | POA: Diagnosis not present

## 2017-12-09 ENCOUNTER — Other Ambulatory Visit: Payer: Self-pay | Admitting: Nephrology

## 2017-12-09 DIAGNOSIS — N183 Chronic kidney disease, stage 3 unspecified: Secondary | ICD-10-CM

## 2017-12-15 ENCOUNTER — Ambulatory Visit
Admission: RE | Admit: 2017-12-15 | Discharge: 2017-12-15 | Disposition: A | Discharge status: Home / Self Care | Payer: Medicare HMO | Source: Ambulatory Visit | Attending: Nephrology | Admitting: Nephrology

## 2017-12-15 DIAGNOSIS — N183 Chronic kidney disease, stage 3 unspecified: Secondary | ICD-10-CM

## 2017-12-21 ENCOUNTER — Ambulatory Visit (INDEPENDENT_AMBULATORY_CARE_PROVIDER_SITE_OTHER): Payer: Medicare HMO | Admitting: Cardiovascular Disease

## 2017-12-21 ENCOUNTER — Encounter: Payer: Self-pay | Admitting: Cardiovascular Disease

## 2017-12-21 VITALS — BP 120/84 | HR 104 | Ht 66.0 in | Wt 151.6 lb

## 2017-12-21 DIAGNOSIS — I482 Chronic atrial fibrillation, unspecified: Secondary | ICD-10-CM

## 2017-12-21 DIAGNOSIS — I10 Essential (primary) hypertension: Secondary | ICD-10-CM

## 2017-12-21 MED ORDER — RIVAROXABAN 15 MG PO TABS: 15.0000 mg | ORAL_TABLET | Freq: Every day | ORAL | 3 refills | Status: DC

## 2017-12-21 MED ORDER — METOPROLOL TARTRATE 50 MG PO TABS: 50.0000 mg | ORAL_TABLET | Freq: Two times a day (BID) | ORAL | 3 refills | Status: DC

## 2017-12-21 NOTE — Progress Notes (Signed)
Cardiology Office Note:    Date:  12/21/2017   ID:  Carolyn Wells, DOB [birth year], MRN [id number]  PCP:  Biagio Borg, MD  Cardiologist:    Liam Rogers   Electrophysiologist:  n/a  Referring MD:  Dr. Scarlette Shorts  Problem List  1. Paroxysmal atrial fib  2. Hyperlipidemia 3. Essential hypertension 4. CKD - donated a kidney to her husband   Chief Complaint  Patient presents with  . Atrial Fibrillation    History of Present Illness:     Carolyn Wells is a 77 y.o. female with a hx of HTN, HL, atrial fib  CKD stage 3 (solitary kidney).   CHADS2-VASc=4 (female, age 53, HTN).  This patients CHA2DS2-VASc Score and unadjusted Ischemic Stroke Rate (% per year) is equal to 4.8 % stroke rate/year from a score of 4 Above score calculated as 1 point each if present [CHF, HTN, DM, Vascular=MI/PAD/Aortic Plaque, Age if 65-74, or Female] Above score calculated as 2 points each if present [Age > 75, or Stroke/TIA/TE]  Mar 24, 2016:  Feels very well. No symptoms.     still active.   Exercises regularly  Checks BP regularly ,  Her reading are all very good.  Is on metoprolol for rate control and mild HTN.    Nov. 20, 2017:  Carolyn Wells is doing great Carolyn Wells remains asymptomatic. Carolyn Wells is still in atrial ablation but cannot tell that her heart beat is irregular.  April 14, 2017:  Carolyn Wells is doing well Held her Xarelto for 7 days prior to back injection Staying active.   Rides her stationary bike   December 21, 2017: Carolyn Wells is seen today for follow-up of her atrial fibrillation.   Carolyn Wells also has a history of hypertension and hyperlipidemia. Is on chronic anticoagulation Has a runny nose for the past 2 years .  Has had an extensive work up - no answers yet   Past Medical History:  Diagnosis Date  . ABDOMINAL INCISIONAL HERNIA 08/13/2009   s/p repair  . Allergic rhinitis, cause unspecified 09/28/2011  . BRADYCARDIA 09/26/2010  . CKD (chronic kidney disease), stage III ([place]) 02/26/2015     Solitary kidney  . COLONIC POLYPS, HX OF 08/25/2007  . DIVERTICULOSIS, COLON 08/25/2007  . GASTROESOPHAGEAL REFLUX DISEASE 08/04/2007  . HYPERLIPIDEMIA 09/27/2010   not on Rx therapy  . HYPERTENSION 08/06/2007  . Hypothyroidism   . INTERNAL HEMORRHOIDS 08/04/2007  . LOW BACK PAIN 08/06/2007   scoliosis; DJD; gets injections  . OSTEOPENIA 08/04/2007  . PAF (paroxysmal atrial fibrillation) ([place])    a.  Echo 2/17:  Vigorous LVF, EF 65-70%, no RWMA, Ao sclerosis, trivial AI, trivial MR, trivial TR, PASP 23 mmHg  . Solitary kidney, acquired 09/28/2011   donated R kidney to husband    Past Surgical History:  Procedure Laterality Date  . LAMINECTOMY  1999   L4-5/lumbar  . NEPHRECTOMY     Right  . s/o incisional hernia repair     RUQ  . TONSILLECTOMY      Current Medications: Outpatient Medications Prior to Visit  Medication Sig Dispense Refill  . Ascorbic Acid (VITAMIN C) 1000 MG tablet Take 1,000 mg by mouth daily.    . Calcium Citrate (CITRACAL PO) Take 2 tablets by mouth daily.     [name] levothyroxine (SYNTHROID, LEVOTHROID) 25 MCG tablet TAKE 1 TABLET BY MOUTH ONCE DAILY 90 tablet 2  . LUMIGAN 0.01 % SOLN Place 1 drop into both eyes at bedtime.     [name]  metoprolol tartrate (LOPRESSOR) 25 MG tablet TAKE ONE TABLET BY MOUTH TWICE DAILY 180 tablet 1  . Multiple Vitamins-Minerals (WOMENS 50+ MULTI VITAMIN/MIN PO) Take 1 tablet by mouth daily.    . Omega-3 Krill Oil 300 MG CAPS Take 1 capsule by mouth.    . pantoprazole (PROTONIX) 40 MG tablet Take 1 tablet (40 mg total) by mouth daily. 90 tablet 3  . pimecrolimus (ELIDEL) 1 % cream Apply topically 2 (two) times daily. 60 g 0  . VITAMIN K PO Take 100 mcg by mouth daily.    Alveda Reasons 15 MG TABS tablet TAKE ONE TABLET BY MOUTH ONCE DAILY WITH SUPPER 90 tablet 1  . diclofenac sodium (VOLTAREN) 1 % GEL Apply 1 application topically daily as needed (FOR BACK PAIN).     . Probiotic Product (VSL#3) CAPS Take 2 capsules by mouth daily. (Patient  not taking: Reported on 12/21/2017) 28 capsule 0  . TURMERIC PO Take 2 tablets by mouth once  Daily Patient not sure of dosage.     No facility-administered medications prior to visit.      Allergies:   Hydrocodone   Social History   Socioeconomic History  . Marital status: Married    Spouse name: None  . Number of children: 2  . Years of education: None  . Highest education level: None  Social Needs  . Financial resource strain: None  . Food insecurity - worry: None  . Food insecurity - inability: None  . Transportation needs - medical: None  . Transportation needs - non-medical: None  Occupational History  . 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    Employer: RETIRED  Tobacco Use  . Smoking status: Never Smoker  . Smokeless tobacco: Never Used  Substance and Sexual Activity  . Alcohol use: No    Alcohol/week: 0.0 oz  . Drug use: No  . Sexual activity: None  Other Topics Concern  . None  Social History Narrative   Husband disabled, renal failure, DM, HTN.   Originally from [place], [place] >> came to [place] 1966 (husband in [employer] treasurer)     Family History:  The patient's family history includes Alzheimer's disease in her brother; Coronary artery disease (age of onset: 66) in her mother; Diabetes in her mother and other; Heart attack (age of onset: 63) in her father.   ROS:   As noted in current hx.    Physical Exam: Blood pressure 120/84, pulse (!) 104, height 5\' 6"  (1.676 m), weight 151 lb 9.6 oz (68.8 kg), SpO2 97 %.  GEN:  Well nourished, well developed in no acute distress HEENT: Normal NECK: No JVD; No carotid bruits LYMPHATICS: No lymphadenopathy CARDIAC:  Irreg. Irreg.    RESPIRATORY:  Clear to auscultation without rales, wheezing or rhonchi  ABDOMEN: Soft, non-tender, non-distended MUSCULOSKELETAL:  No edema; No deformity  SKIN: Warm and dry NEUROLOGIC:  Alert and oriented x 3   Wt Readings from Last 3 Encounters:  12/21/17 151 lb 9.6 oz (68.8 kg)   08/27/17 152 lb (68.9 kg)  05/05/17 150 lb (68 kg)      Studies/Labs Reviewed:     EKG:    Recent Labs: 08/27/2017: ALT 16; BUN 20; Creatinine, Ser 1.58; Hemoglobin 14.4; Platelets 229.0; Potassium 4.8; Sodium 139; TSH 3.68   Recent Lipid Panel    Component Value Date/Time   CHOL 228 (H) 08/27/2017 1443   TRIG 194.0 (H) 08/27/2017 1443   HDL 72.30 08/27/2017 1443   CHOLHDL 3 08/27/2017 1443  VLDL 38.8 08/27/2017 1443   LDLCALC 117 (H) 08/27/2017 1443   LDLDIRECT 117.7 09/26/2013 1420     Assessment / PLAN:      1.  Chronic atrial fibrillation: Carolyn Wells remains in atrial fibrillation.  Carolyn Wells is on Xarelto 15 mg a day. Her ventricular response was slightly fast.  We will increase the metoprolol to 50 mg twice a day.  2. HTN -blood pressure is well controlled.  3. CKD -Carolyn Wells is followed by nephrology.  5. Hyperlipidemia:  - stable        [name], MD  12/21/2017 3:46 PM    [facility] [address], [place], [place]  [address] Phone: [phone]; Fax: [phone]

## 2017-12-21 NOTE — Patient Instructions (Signed)
Medication Instructions:  Your physician has recommended you make the following change in your medication:  INCREASE Metoprolol to 50 mg twice daily   Labwork: None Ordered   Testing/Procedures: None Ordered   Follow-Up: Your physician wants you to follow-up in: 6 months with Dr. Acie Fredrickson. You will receive a reminder letter in the mail two months in advance. If you don't receive a letter, please call our office to schedule the follow-up appointment.   If you need a refill on your cardiac medications before your next appointment, please call your pharmacy.   Thank you for choosing [facility]! Christen Bame, RN [phone]

## 2018-01-14 DIAGNOSIS — H401112 Primary open-angle glaucoma, right eye, moderate stage: Secondary | ICD-10-CM | POA: Diagnosis not present

## 2018-01-14 DIAGNOSIS — H401122 Primary open-angle glaucoma, left eye, moderate stage: Secondary | ICD-10-CM | POA: Diagnosis not present

## 2018-01-19 DIAGNOSIS — M5417 Radiculopathy, lumbosacral region: Secondary | ICD-10-CM | POA: Diagnosis not present

## 2018-01-19 DIAGNOSIS — M48062 Spinal stenosis, lumbar region with neurogenic claudication: Secondary | ICD-10-CM | POA: Diagnosis not present

## 2018-01-27 ENCOUNTER — Encounter: Payer: Self-pay | Admitting: Family Medicine

## 2018-01-27 ENCOUNTER — Ambulatory Visit (INDEPENDENT_AMBULATORY_CARE_PROVIDER_SITE_OTHER): Payer: Medicare HMO | Admitting: Family Medicine

## 2018-01-27 ENCOUNTER — Ambulatory Visit: Payer: Medicare HMO | Admitting: Internal Medicine

## 2018-01-27 VITALS — HR 76 | Temp 98.0°F | Ht 66.0 in | Wt 147.0 lb

## 2018-01-27 DIAGNOSIS — J069 Acute upper respiratory infection, unspecified: Secondary | ICD-10-CM | POA: Diagnosis not present

## 2018-01-27 DIAGNOSIS — M7041 Prepatellar bursitis, right knee: Secondary | ICD-10-CM

## 2018-01-27 MED ORDER — PREDNISONE 5 MG PO TABS: ORAL_TABLET | ORAL | 0 refills | Status: DC

## 2018-01-27 NOTE — Assessment & Plan Note (Addendum)
Small effusion in the prepatellar bursa  - counseled on compression  - provided ace wrap  - if no improvement can aspirate

## 2018-01-27 NOTE — Assessment & Plan Note (Signed)
Symptoms likely viral in nature  - counseled on supportive care  - f/u PRN

## 2018-01-27 NOTE — Patient Instructions (Signed)
Please try to wrap the area if you are up moving around  Please try to use ice  Please follow up with me in 2-3 weeks if there is no improvement   Please try things such as zyrtec-D or allegra-D which is an antihistamine and decongestant.   Please try afrin which will help with nasal congestion but use for only three days.   Please also try using a netti pot on a regular occasion.  Honey can help with a sore throat.   Vick's and Delsym can help with cough

## 2018-01-27 NOTE — Progress Notes (Signed)
Carolyn Wells - 77 y.o. female MRN [id number]  Date of birth: [birth year]  SUBJECTIVE:  Including CC & ROS.  Chief Complaint  Patient presents with  . Right knee pain    Carolyn Wells is a 77 y.o. female that is presenting with knee pain. She fell in her driveway six months, and landed on her knees. Pain is located on the anterior portion of her knee. Denies tingling or numbness. Pain is mild severe to the touch. Denies pain upon flexion or extension. She has not taken anything for the pain. Admits to swelling and tenderness.   Reports to having some congestion and sore throat that started two days ago. No fevers or body aches. Has not tried any OTC medications.   Review of Systems  Constitutional: Negative for fever.  HENT: Negative for congestion.   Respiratory: Negative for cough.   Cardiovascular: Negative for chest pain.  Gastrointestinal: Negative for abdominal pain.  Musculoskeletal: Negative for gait problem.  Skin: Negative for color change.  Neurological: Negative for weakness.  Hematological: Negative for adenopathy.  Psychiatric/Behavioral: Negative for agitation.    HISTORY: Past Medical, Surgical, Social, and Family History Reviewed & Updated per EMR.   Pertinent Historical Findings include:  Past Medical History:  Diagnosis Date  . ABDOMINAL INCISIONAL HERNIA 08/13/2009   s/p repair  . Allergic rhinitis, cause unspecified 09/28/2011  . BRADYCARDIA 09/26/2010  . CKD (chronic kidney disease), stage III ([place]) 02/26/2015   Solitary kidney  . COLONIC POLYPS, HX OF 08/25/2007  . DIVERTICULOSIS, COLON 08/25/2007  . GASTROESOPHAGEAL REFLUX DISEASE 08/04/2007  . HYPERLIPIDEMIA 09/27/2010   not on Rx therapy  . HYPERTENSION 08/06/2007  . Hypothyroidism   . INTERNAL HEMORRHOIDS 08/04/2007  . LOW BACK PAIN 08/06/2007   scoliosis; DJD; gets injections  . OSTEOPENIA 08/04/2007  . PAF (paroxysmal atrial fibrillation) ([place])    a.  Echo 2/17:  Vigorous LVF, EF 65-70%, no  RWMA, Ao sclerosis, trivial AI, trivial MR, trivial TR, PASP 23 mmHg  . Solitary kidney, acquired 09/28/2011   donated R kidney to husband    Past Surgical History:  Procedure Laterality Date  . LAMINECTOMY  1999   L4-5/lumbar  . NEPHRECTOMY     Right  . s/o incisional hernia repair     RUQ  . TONSILLECTOMY      Allergies  Allergen Reactions  . Hydrocodone Nausea Only and Other (See Comments)    lethargy    Family History  Problem Relation Age of Onset  . Coronary artery disease Mother 73       1st degree relative   . Diabetes Mother   . Heart attack Father 35  . Diabetes Other        1st degree relative  . Alzheimer's disease Brother      Social History   Socioeconomic History  . Marital status: Married    Spouse name: Not on file  . Number of children: 2  . Years of education: Not on file  . Highest education level: Not on file  Occupational History  . 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    Employer: RETIRED  Social Needs  . Financial resource strain: Not on file  . Food insecurity:    Worry: Not on file    Inability: Not on file  . Transportation needs:    Medical: Not on file    Non-medical: Not on file  Tobacco Use  . Smoking status: Never Smoker  . Smokeless tobacco: Never Used  Substance and Sexual Activity  . Alcohol use: No    Alcohol/week: 0.0 oz  . Drug use: No  . Sexual activity: Not on file  Lifestyle  . Physical activity:    Days per week: Not on file    Minutes per session: Not on file  . Stress: Not on file  Relationships  . Social connections:    Talks on phone: Not on file    Gets together: Not on file    Attends religious service: Not on file    Active member of club or organization: Not on file    Attends meetings of clubs or organizations: Not on file    Relationship status: Not on file  . Intimate partner violence:    Fear of current or ex partner: Not on file    Emotionally abused: Not on file     Physically abused: Not on file    Forced sexual activity: Not on file  Other Topics Concern  . Not on file  Social History Narrative   Husband disabled, renal failure, DM, HTN.   Originally from [place], [place] >> came to [place] 1966 (husband in [employer] treasurer)     PHYSICAL EXAM:  VS: Pulse 76   Temp 98 F (36.7 C) (Oral)   Ht 5\' 6"  (1.676 m)   Wt 147 lb (66.7 kg)   SpO2 98%   BMI 23.73 kg/m  Physical Exam Gen: NAD, alert, cooperative with exam, ENT: normal lips, normal nasal mucosa, tympanic membranes clear and intact bilaterally, normal oropharynx, no cervical lymphadenopathy Eye: normal EOM, normal conjunctiva and lids CV:  no edema, +2 pedal pulses, regular rate and rhythm, S1-S2   Resp: no accessory muscle use, non-labored, clear to auscultation bilaterally, no crackles or wheezes  Skin: no rashes, no areas of induration  Neuro: normal tone, normal sensation to touch Psych:  normal insight, alert and oriented MSK:  Right knee:  Pre patellar effusion  Normal knee flexion and extension  TTP of the lateral femoral condyle  No TTP of the medial or lateral joint line Normal gait  Neurovascularly intact   Limited ultrasound: right knee :  Prepatellar bursa effusion  No effusion in SPP Normal QT and PT  Normal medial and later joint line   Summary: pre patellar bursitis.   Ultrasound and interpretation by Clearance Coots, MD          ASSESSMENT & PLAN:   Upper respiratory tract infection Symptoms likely viral in nature  - counseled on supportive care  - f/u PRN   Prepatellar bursitis of right knee Small effusion in the prepatellar bursa  - counseled on compression  - provided ace wrap  - if no improvement can aspirate

## 2018-02-08 ENCOUNTER — Ambulatory Visit (INDEPENDENT_AMBULATORY_CARE_PROVIDER_SITE_OTHER): Payer: Medicare HMO | Admitting: Family Medicine

## 2018-02-08 ENCOUNTER — Encounter: Payer: Self-pay | Admitting: Family Medicine

## 2018-02-08 DIAGNOSIS — M7041 Prepatellar bursitis, right knee: Secondary | ICD-10-CM

## 2018-02-08 DIAGNOSIS — M545 Low back pain, unspecified: Secondary | ICD-10-CM

## 2018-02-08 MED ORDER — CEPHALEXIN 500 MG PO CAPS: 500.0000 mg | ORAL_CAPSULE | Freq: Two times a day (BID) | ORAL | 0 refills | Status: DC

## 2018-02-08 NOTE — Progress Notes (Signed)
Carolyn Wells - 77 y.o. female MRN [id number]  Date of birth: 05/19/1941  SUBJECTIVE:  Including CC & ROS.  Chief Complaint  Patient presents with  . Follow-up    right knee   . Back Pain    Carolyn Wells is a 77 y.o. female that is here for right knee bursitis follow up. She states the pain has not improved. Admits to swelling and tenderness. She has been keeping her knee wrapped. Pain is mild to severe upon touching. Denies pain upon flexion and extension. She completed prednisone.   She has chronic back pain, she has been receiving cortisone injections at [facility] with no improvement. Previous laminectomy done in 1999. Denies tingling or numbness. Pain is worse when walking and standing.   Review of the CT lumbar spine from 2015 showsmoderate left neural foraminal stenosis at L4-5. Mild-to-moderate right lateral recess stenosis at L1/2. Mild to moderate lateral recess narrowing at other levels. Moderate to severe left facet arthrosis at L5-S1.    Review of Systems  Constitutional: Negative for fever.  HENT: Negative for congestion.   Respiratory: Negative for cough.   Cardiovascular: Negative for chest pain.  Gastrointestinal: Negative for abdominal pain.  Musculoskeletal: Positive for arthralgias and back pain.  Skin: Negative for color change.  Allergic/Immunologic: Negative for immunocompromised state.  Neurological: Negative for weakness.  Hematological: Negative for adenopathy.  Psychiatric/Behavioral: Negative for agitation.    HISTORY: Past Medical, Surgical, Social, and Family History Reviewed & Updated per EMR.   Pertinent Historical Findings include:  Past Medical History:  Diagnosis Date  . ABDOMINAL INCISIONAL HERNIA 08/13/2009   s/p repair  . Allergic rhinitis, cause unspecified 09/28/2011  . BRADYCARDIA 09/26/2010  . CKD (chronic kidney disease), stage III ([place]) 02/26/2015   Solitary kidney  . COLONIC POLYPS, HX OF 08/25/2007  .  DIVERTICULOSIS, COLON 08/25/2007  . GASTROESOPHAGEAL REFLUX DISEASE 08/04/2007  . HYPERLIPIDEMIA 09/27/2010   not on Rx therapy  . HYPERTENSION 08/06/2007  . Hypothyroidism   . INTERNAL HEMORRHOIDS 08/04/2007  . LOW BACK PAIN 08/06/2007   scoliosis; DJD; gets injections  . OSTEOPENIA 08/04/2007  . PAF (paroxysmal atrial fibrillation) ([place])    a.  Echo 2/17:  Vigorous LVF, EF 65-70%, no RWMA, Ao sclerosis, trivial AI, trivial MR, trivial TR, PASP 23 mmHg  . Solitary kidney, acquired 09/28/2011   donated R kidney to husband    Past Surgical History:  Procedure Laterality Date  . LAMINECTOMY  1999   L4-5/lumbar  . NEPHRECTOMY     Right  . s/o incisional hernia repair     RUQ  . TONSILLECTOMY      Allergies  Allergen Reactions  . Hydrocodone Nausea Only and Other (See Comments)    lethargy    Family History  Problem Relation Age of Onset  . Coronary artery disease Mother 46       1st degree relative   . Diabetes Mother   . Heart attack Father 70  . Diabetes Other        1st degree relative  . Alzheimer's disease Brother      Social History   Socioeconomic History  . Marital status: Married    Spouse name: Not on file  . Number of children: 2  . Years of education: Not on file  . Highest education level: Not on file  Occupational History  . 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    Employer: RETIRED  Social Needs  . Financial resource strain: Not  on file  . Food insecurity:    Worry: Not on file    Inability: Not on file  . Transportation needs:    Medical: Not on file    Non-medical: Not on file  Tobacco Use  . Smoking status: Never Smoker  . Smokeless tobacco: Never Used  Substance and Sexual Activity  . Alcohol use: No    Alcohol/week: 0.0 oz  . Drug use: No  . Sexual activity: Not on file  Lifestyle  . Physical activity:    Days per week: Not on file    Minutes per session: Not on file  . Stress: Not on file  Relationships  . Social  connections:    Talks on phone: Not on file    Gets together: Not on file    Attends religious service: Not on file    Active member of club or organization: Not on file    Attends meetings of clubs or organizations: Not on file    Relationship status: Not on file  . Intimate partner violence:    Fear of current or ex partner: Not on file    Emotionally abused: Not on file    Physically abused: Not on file    Forced sexual activity: Not on file  Other Topics Concern  . Not on file  Social History Narrative   Husband disabled, renal failure, DM, HTN.   Originally from [place], [place] >> came to [place] 1966 (husband in [employer] treasurer)     PHYSICAL EXAM:  VS: BP 132/68 (BP Location: Left Arm, Patient Position: Sitting, Cuff Size: Normal)   Pulse 88   Temp 97.8 F (36.6 C) (Oral)   Ht 5\' 6"  (1.676 m)   Wt 151 lb (68.5 kg)   SpO2 97%   BMI 24.37 kg/m  Physical Exam Gen: NAD, alert, cooperative with exam, well-appearing ENT: normal lips, normal nasal mucosa,  Eye: normal EOM, normal conjunctiva and lids CV:  no edema, +2 pedal pulses   Resp: no accessory muscle use, non-labored,  Skin: no rashes, no areas of induration  Neuro: normal tone, normal sensation to touch Psych:  normal insight, alert and oriented MSK:  Right knee:  TTp of the prepatellar bursa No overlying redness  Normal flexion and extension  Neurovascuarly intact.    Aspiration/Injection Procedure Note Carolyn Wells 1941-11-07  Procedure: Injection Indications: right knee pain/prepatellar bursitis   Procedure Details Consent: Risks of procedure as well as the alternatives and risks of each were explained to the (patient/caregiver).  Consent for procedure obtained. Time Out: Verified patient identification, verified procedure, site/side was marked, verified correct patient position, special equipment/implants available, medications/allergies/relevent history reviewed, required imaging and test results available.   Performed.  The area was cleaned with iodine and alcohol swabs.    The right prepatellar bursa was inuected 4 cc's of 1% lidocaine with a 25 1 1/2" needle. An 18-gauge needle was then inserted and aspiration took place.  Ultrasound was used. Images were obtained in Long views showing the injection.    Amount of Fluid Aspirated: 45mL Character of Fluid: clear and bloody Fluid was sent for:n/a A sterile dressing was applied.  Patient did tolerate procedure well.         ASSESSMENT & PLAN:   LOW BACK PAIN Acute on chronic in nature. Localized to the lower back - counseled on pilates and yoga - has a solitary kidney so avoiding NSAIDS  - could try PT   Prepatellar bursitis of right knee  This is chronic and occurred after her fall about 6 months ago. No improvement with prednisone  - Aspiration today. - keflex  - if no improvement may have to to aspirate again. May have to consider surgery.

## 2018-02-08 NOTE — Assessment & Plan Note (Signed)
This is chronic and occurred after her fall about 6 months ago. No improvement with prednisone  - Aspiration today. - keflex  - if no improvement may have to to aspirate again. May have to consider surgery.

## 2018-02-08 NOTE — Patient Instructions (Addendum)
Please try ice  Please follow up with me if you don't have improvement.

## 2018-02-08 NOTE — Assessment & Plan Note (Signed)
Acute on chronic in nature. Localized to the lower back - counseled on pilates and yoga - has a solitary kidney so avoiding NSAIDS  - could try PT

## 2018-03-09 DIAGNOSIS — Z1231 Encounter for screening mammogram for malignant neoplasm of breast: Secondary | ICD-10-CM | POA: Diagnosis not present

## 2018-03-09 LAB — HM MAMMOGRAPHY

## 2018-03-14 ENCOUNTER — Other Ambulatory Visit: Payer: Self-pay | Admitting: Internal Medicine

## 2018-05-04 DIAGNOSIS — Z6826 Body mass index (BMI) 26.0-26.9, adult: Secondary | ICD-10-CM | POA: Diagnosis not present

## 2018-05-04 DIAGNOSIS — Z124 Encounter for screening for malignant neoplasm of cervix: Secondary | ICD-10-CM | POA: Diagnosis not present

## 2018-05-05 ENCOUNTER — Encounter: Payer: Self-pay | Admitting: Family Medicine

## 2018-05-05 ENCOUNTER — Ambulatory Visit (INDEPENDENT_AMBULATORY_CARE_PROVIDER_SITE_OTHER): Payer: Medicare HMO | Admitting: Family Medicine

## 2018-05-05 VITALS — BP 126/72 | HR 78 | Ht 66.0 in | Wt 151.0 lb

## 2018-05-05 DIAGNOSIS — M7041 Prepatellar bursitis, right knee: Secondary | ICD-10-CM

## 2018-05-05 DIAGNOSIS — G8929 Other chronic pain: Secondary | ICD-10-CM

## 2018-05-05 DIAGNOSIS — M545 Low back pain: Secondary | ICD-10-CM

## 2018-05-05 MED ORDER — GABAPENTIN 100 MG PO CAPS: 100.0000 mg | ORAL_CAPSULE | Freq: Two times a day (BID) | ORAL | 1 refills | Status: DC

## 2018-05-05 MED ORDER — CEPHALEXIN 500 MG PO CAPS: 500.0000 mg | ORAL_CAPSULE | Freq: Two times a day (BID) | ORAL | 0 refills | Status: DC

## 2018-05-05 NOTE — Assessment & Plan Note (Signed)
Acute on chronic in nature. Receives injection with limited improvement.  - try gabapentin. Reduced dose due to CCr  - if no improvement consider PT, pilates, manipulation with Dr. Tamala Julian

## 2018-05-05 NOTE — Progress Notes (Signed)
Carolyn Wells - 77 y.o. female MRN [id number]  Date of birth: 1941/06/16  SUBJECTIVE:  Including CC & ROS.  Chief Complaint  Patient presents with  . Follow-up    Carolyn Wells is a 77 y.o. female that here today for right knee follow up. She was seen on 02/27/18 for right knee bursitis,  5 ml was drained. She reports swelling has increased over the past three weeks. Denies tenderness or redness. Denies injury. Denies any trauma. Has not tried any for the swelling. Denies any recent illness.   Has a history of back pain over the years.  She has been seen by her back doctor that gives her injections.  These helped from time to time.  The pain is middle of her back and radiates to the bottom.  She denies any radicular symptoms today.  She has not tried any medications recently.  She has not had any physical therapy.  She denies any injury or inciting event.  Review of the CT lumbar spine from 2015 shows moderate left neural foraminal stenosis L4-5, mild to moderate right lateral recess stenosis at L1-2 and moderate to severe left facet arthrosis L5-S1.   Review of Systems  Constitutional: Negative for fever.  HENT: Negative for congestion.   Respiratory: Negative for cough.   Cardiovascular: Negative for chest pain.  Gastrointestinal: Negative for abdominal pain.  Musculoskeletal: Negative for gait problem.  Skin: Negative for color change.  Neurological: Negative for weakness.  Hematological: Negative for adenopathy.  Psychiatric/Behavioral: Negative for agitation.    HISTORY: Past Medical, Surgical, Social, and Family History Reviewed & Updated per EMR.   Pertinent Historical Findings include:  Past Medical History:  Diagnosis Date  . ABDOMINAL INCISIONAL HERNIA 08/13/2009   s/p repair  . Allergic rhinitis, cause unspecified 09/28/2011  . BRADYCARDIA 09/26/2010  . CKD (chronic kidney disease), stage III ([place]) 02/26/2015   Solitary kidney  . COLONIC POLYPS, HX OF 08/25/2007   . DIVERTICULOSIS, COLON 08/25/2007  . GASTROESOPHAGEAL REFLUX DISEASE 08/04/2007  . HYPERLIPIDEMIA 09/27/2010   not on Rx therapy  . HYPERTENSION 08/06/2007  . Hypothyroidism   . INTERNAL HEMORRHOIDS 08/04/2007  . LOW BACK PAIN 08/06/2007   scoliosis; DJD; gets injections  . OSTEOPENIA 08/04/2007  . PAF (paroxysmal atrial fibrillation) ([place])    a.  Echo 2/17:  Vigorous LVF, EF 65-70%, no RWMA, Ao sclerosis, trivial AI, trivial MR, trivial TR, PASP 23 mmHg  . Solitary kidney, acquired 09/28/2011   donated R kidney to husband    Past Surgical History:  Procedure Laterality Date  . LAMINECTOMY  1999   L4-5/lumbar  . NEPHRECTOMY     Right  . s/o incisional hernia repair     RUQ  . TONSILLECTOMY      Allergies  Allergen Reactions  . Hydrocodone Nausea Only and Other (See Comments)    lethargy    Family History  Problem Relation Age of Onset  . Coronary artery disease Mother 27       1st degree relative   . Diabetes Mother   . Heart attack Father 9  . Diabetes Other        1st degree relative  . Alzheimer's disease Brother      Social History   Socioeconomic History  . Marital status: Married    Spouse name: Not on file  . Number of children: 2  . Years of education: Not on file  . Highest education level: Not on file  Occupational History  .  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    Employer: RETIRED  Social Needs  . Financial resource strain: Not on file  . Food insecurity:    Worry: Not on file    Inability: Not on file  . Transportation needs:    Medical: Not on file    Non-medical: Not on file  Tobacco Use  . Smoking status: Never Smoker  . Smokeless tobacco: Never Used  Substance and Sexual Activity  . Alcohol use: No    Alcohol/week: 0.0 oz  . Drug use: No  . Sexual activity: Not on file  Lifestyle  . Physical activity:    Days per week: Not on file    Minutes per session: Not on file  . Stress: Not on file  Relationships  .  Social connections:    Talks on phone: Not on file    Gets together: Not on file    Attends religious service: Not on file    Active member of club or organization: Not on file    Attends meetings of clubs or organizations: Not on file    Relationship status: Not on file  . Intimate partner violence:    Fear of current or ex partner: Not on file    Emotionally abused: Not on file    Physically abused: Not on file    Forced sexual activity: Not on file  Other Topics Concern  . Not on file  Social History Narrative   Husband disabled, renal failure, DM, HTN.   Originally from [place], [place] >> came to [place] 1966 (husband in [employer])     PHYSICAL EXAM:  VS: BP 126/72 (BP Location: Left Arm, Patient Position: Sitting, Cuff Size: Normal)   Pulse 78   Ht 5\' 6"  (1.676 m)   Wt 151 lb (68.5 kg)   SpO2 98%   BMI 24.37 kg/m  Physical Exam Gen: NAD, alert, cooperative with exam, well-appearing ENT: normal lips, normal nasal mucosa,  Eye: normal EOM, normal conjunctiva and lids CV:  no edema, +2 pedal pulses   Resp: no accessory muscle use, non-labored,  Skin: no rashes, no areas of induration  Neuro: normal tone, normal sensation to touch Psych:  normal insight, alert and oriented MSK:  Right knee:  Pre patellar bursa evident.  Normal flexion and extension. Normal strength to resistance  Normal valgus and varus testing  Normal gait  Normal McMurray's testing  Neurovascularly intact     Aspiration/Injection Procedure Note TOMISHA REPPUCCI [birth year]  Procedure: Aspiration Indications: right pre patellar bursa   Procedure Details Consent: Risks of procedure as well as the alternatives and risks of each were explained to the (patient/caregiver).  Consent for procedure obtained. Time Out: Verified patient identification, verified procedure, site/side was marked, verified correct patient position, special equipment/implants available, medications/allergies/relevent history reviewed,  required imaging and test results available.  Performed.  The area was cleaned with iodine and alcohol swabs.    The right pre patellar bursa was aspiration     Amount of Fluid Aspirated: 7mL Character of Fluid: clear and straw colored Fluid was sent for:n/a A sterile dressing was applied.  Patient did tolerate procedure well.       ASSESSMENT & PLAN:   LOW BACK PAIN Acute on chronic in nature. Receives injection with limited improvement.  - try gabapentin. Reduced dose due to CCr  - if no improvement consider PT, pilates, manipulation with Dr. Tamala Julian   Prepatellar bursitis of right knee Recurrent epidose. Denies trauma. Normal appearance  on aspiration  - aspiration  - compression applied - keflex  - if no improvement consider aspiration

## 2018-05-05 NOTE — Patient Instructions (Addendum)
Good to see you  Please try to keep compression on your knee if you're walking around  Please complete the medicine   I have provided gabapentin which can help with back pain. If this doesn't help then we could try physical therapy.   Please see me back if it happens again

## 2018-05-05 NOTE — Assessment & Plan Note (Signed)
Recurrent epidose. Denies trauma. Normal appearance on aspiration  - aspiration  - compression applied - keflex  - if no improvement consider aspiration

## 2018-05-06 ENCOUNTER — Ambulatory Visit: Payer: Medicare HMO

## 2018-05-16 DIAGNOSIS — H401112 Primary open-angle glaucoma, right eye, moderate stage: Secondary | ICD-10-CM | POA: Diagnosis not present

## 2018-05-16 DIAGNOSIS — H401122 Primary open-angle glaucoma, left eye, moderate stage: Secondary | ICD-10-CM | POA: Diagnosis not present

## 2018-05-26 DIAGNOSIS — K219 Gastro-esophageal reflux disease without esophagitis: Secondary | ICD-10-CM | POA: Diagnosis not present

## 2018-05-26 DIAGNOSIS — E785 Hyperlipidemia, unspecified: Secondary | ICD-10-CM | POA: Diagnosis not present

## 2018-05-26 DIAGNOSIS — Q600 Renal agenesis, unilateral: Secondary | ICD-10-CM | POA: Diagnosis not present

## 2018-05-26 DIAGNOSIS — I129 Hypertensive chronic kidney disease with stage 1 through stage 4 chronic kidney disease, or unspecified chronic kidney disease: Secondary | ICD-10-CM | POA: Diagnosis not present

## 2018-05-26 DIAGNOSIS — R809 Proteinuria, unspecified: Secondary | ICD-10-CM | POA: Diagnosis not present

## 2018-05-26 DIAGNOSIS — N183 Chronic kidney disease, stage 3 (moderate): Secondary | ICD-10-CM | POA: Diagnosis not present

## 2018-06-14 ENCOUNTER — Telehealth: Payer: Self-pay | Admitting: Emergency Medicine

## 2018-06-14 NOTE — Telephone Encounter (Signed)
Called patient to schedule AWV. Pt declined at this time. 

## 2018-07-04 ENCOUNTER — Ambulatory Visit (INDEPENDENT_AMBULATORY_CARE_PROVIDER_SITE_OTHER): Payer: Medicare HMO | Admitting: Cardiovascular Disease

## 2018-07-04 ENCOUNTER — Encounter: Payer: Self-pay | Admitting: Cardiovascular Disease

## 2018-07-04 VITALS — BP 142/86 | HR 59 | Ht 66.0 in | Wt 154.0 lb

## 2018-07-04 DIAGNOSIS — I10 Essential (primary) hypertension: Secondary | ICD-10-CM | POA: Diagnosis not present

## 2018-07-04 DIAGNOSIS — I482 Chronic atrial fibrillation, unspecified: Secondary | ICD-10-CM

## 2018-07-04 MED ORDER — AMLODIPINE BESYLATE 2.5 MG PO TABS: 2.5000 mg | ORAL_TABLET | Freq: Every day | ORAL | 3 refills | Status: DC

## 2018-07-04 NOTE — Progress Notes (Signed)
Cardiology Office Note:    Date:  07/04/2018   ID:  Carolyn Wells, DOB [birth year], MRN [id number]  PCP:  Biagio Borg, MD  Cardiologist:    Liam Rogers   Electrophysiologist:  n/a  Referring MD:  Dr. Scarlette Shorts  Problem List  1. Paroxysmal atrial fib  2. Hyperlipidemia 3. Essential hypertension 4. CKD - donated a kidney to her husband   Chief Complaint  Patient presents with  . Atrial Fibrillation       Carolyn Wells is a 77 y.o. female with a hx of HTN, HL, atrial fib  CKD stage 3 (solitary kidney).   CHADS2-VASc=4 (female, age 38, HTN).  This patients CHA2DS2-VASc Score and unadjusted Ischemic Stroke Rate (% per year) is equal to 4.8 % stroke rate/year from a score of 4 Above score calculated as 1 point each if present [CHF, HTN, DM, Vascular=MI/PAD/Aortic Plaque, Age if 65-74, or Female] Above score calculated as 2 points each if present [Age > 75, or Stroke/TIA/TE]  Mar 24, 2016:  Feels very well. No symptoms.     still active.   Exercises regularly  Checks BP regularly ,  Her reading are all very good.  Is on metoprolol for rate control and mild HTN.    Nov. 20, 2017:  Carolyn Wells is doing great She remains asymptomatic. She is still in atrial ablation but cannot tell that her heart beat is irregular.  April 14, 2017:  Carolyn Wells is doing well Held her Xarelto for 7 days prior to back injection Staying active.   Rides her stationary bike   December 21, 2017: Carolyn Wells is seen today for follow-up of her atrial fibrillation.   She also has a history of hypertension and hyperlipidemia. Is on chronic anticoagulation Has a runny nose for the past 2 years .  Has had an extensive work up - no answers yet   July 04, 2018: Feels well BP is a bit elevated. Today  Has CKD Still eats lots of salt   Past Medical History:  Diagnosis Date  . ABDOMINAL INCISIONAL HERNIA 08/13/2009   s/p repair  . Allergic rhinitis, cause unspecified 09/28/2011  . BRADYCARDIA  09/26/2010  . CKD (chronic kidney disease), stage III ([place]) 02/26/2015   Solitary kidney  . COLONIC POLYPS, HX OF 08/25/2007  . DIVERTICULOSIS, COLON 08/25/2007  . GASTROESOPHAGEAL REFLUX DISEASE 08/04/2007  . HYPERLIPIDEMIA 09/27/2010   not on Rx therapy  . HYPERTENSION 08/06/2007  . Hypothyroidism   . INTERNAL HEMORRHOIDS 08/04/2007  . LOW BACK PAIN 08/06/2007   scoliosis; DJD; gets injections  . OSTEOPENIA 08/04/2007  . PAF (paroxysmal atrial fibrillation) ([facility])    a.  Echo 2/17:  Vigorous LVF, EF 65-70%, no RWMA, Ao sclerosis, trivial AI, trivial MR, trivial TR, PASP 23 mmHg  . Solitary kidney, acquired 09/28/2011   donated R kidney to husband    Past Surgical History:  Procedure Laterality Date  . LAMINECTOMY  1999   L4-5/lumbar  . NEPHRECTOMY     Right  . s/o incisional hernia repair     RUQ  . TONSILLECTOMY      Current Medications: Outpatient Medications Prior to Visit  Medication Sig Dispense Refill  . Ascorbic Acid (VITAMIN C) 1000 MG tablet Take 1,000 mg by mouth daily.    . Calcium Citrate (CITRACAL PO) Take 2 tablets by mouth daily.     [name] levothyroxine (SYNTHROID, LEVOTHROID) 25 MCG tablet TAKE 1 TABLET BY MOUTH ONCE DAILY 90 tablet 2  .  LUMIGAN 0.01 % SOLN Place 1 drop into both eyes at bedtime.     . metoprolol tartrate (LOPRESSOR) 50 MG tablet Take 1 tablet (50 mg total) by mouth 2 (two) times daily. 180 tablet 3  . Multiple Vitamins-Minerals (WOMENS 50+ MULTI VITAMIN/MIN PO) Take 1 tablet by mouth daily.    . Omega-3 Krill Oil 300 MG CAPS Take 1 capsule by mouth.    . pantoprazole (PROTONIX) 40 MG tablet TAKE 1 TABLET BY MOUTH ONCE DAILY 90 tablet 1  . Rivaroxaban (XARELTO) 15 MG TABS tablet Take 1 tablet (15 mg total) by mouth daily with supper. 90 tablet 3  . VITAMIN K PO Take 100 mcg by mouth daily.    . cephALEXin (KEFLEX) 500 MG capsule Take 1 capsule (500 mg total) by mouth 2 (two) times daily. For 3 days (Patient not taking: Reported on 07/04/2018) 6  capsule 0  . gabapentin (NEURONTIN) 100 MG capsule Take 1 capsule (100 mg total) by mouth 2 (two) times daily. (Patient not taking: Reported on 07/04/2018) 60 capsule 1   No facility-administered medications prior to visit.      Allergies:   Ipratropium and Hydrocodone   Social History   Socioeconomic History  . Marital status: Married    Spouse name: Not on file  . Number of children: 2  . Years of education: Not on file  . Highest education level: Not on file  Occupational History  . 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    Employer: RETIRED  Social Needs  . Financial resource strain: Not on file  . Food insecurity:    Worry: Not on file    Inability: Not on file  . Transportation needs:    Medical: Not on file    Non-medical: Not on file  Tobacco Use  . Smoking status: Never Smoker  . Smokeless tobacco: Never Used  Substance and Sexual Activity  . Alcohol use: No    Alcohol/week: 0.0 standard drinks  . Drug use: No  . Sexual activity: Not on file  Lifestyle  . Physical activity:    Days per week: Not on file    Minutes per session: Not on file  . Stress: Not on file  Relationships  . Social connections:    Talks on phone: Not on file    Gets together: Not on file    Attends religious service: Not on file    Active member of club or organization: Not on file    Attends meetings of clubs or organizations: Not on file    Relationship status: Not on file  Other Topics Concern  . Not on file  Social History Narrative   Husband disabled, renal failure, DM, HTN.   Originally from [place], [place] >> came to [place] 1966 (husband in [employer])     Family History:  The patient's family history includes Alzheimer's disease in her brother; Coronary artery disease (age of onset: 39) in her mother; Diabetes in her mother and other; Heart attack (age of onset: 58) in her father.   ROS:   As noted in current hx.   Physical Exam: Blood pressure (!) 142/86, pulse (!) 59,  height 5\' 6"  (1.676 m), weight 154 lb (69.9 kg), SpO2 97 %.  GEN:  Elderly female.  HEENT: Normal NECK: No JVD; No carotid bruits LYMPHATICS: No lymphadenopathy CARDIAC: irreg. Irreg. , no murmurs, rubs, gallops RESPIRATORY:  Clear to auscultation without rales, wheezing or rhonchi  ABDOMEN: Soft, non-tender, non-distended MUSCULOSKELETAL:  No  edema; No deformity  SKIN: Warm and dry NEUROLOGIC:  Alert and oriented x 3   Wt Readings from Last 3 Encounters:  07/04/18 154 lb (69.9 kg)  05/05/18 151 lb (68.5 kg)  02/08/18 151 lb (68.5 kg)      Studies/Labs Reviewed:     EKG:    Recent Labs: 08/27/2017: ALT 16; BUN 20; Creatinine, Ser 1.58; Hemoglobin 14.4; Platelets 229.0; Potassium 4.8; Sodium 139; TSH 3.68   Recent Lipid Panel    Component Value Date/Time   CHOL 228 (H) 08/27/2017 1443   TRIG 194.0 (H) 08/27/2017 1443   HDL 72.30 08/27/2017 1443   CHOLHDL 3 08/27/2017 1443   VLDL 38.8 08/27/2017 1443   LDLCALC 117 (H) 08/27/2017 1443   LDLDIRECT 117.7 09/26/2013 1420     Assessment / PLAN:      1.  Chronic atrial fibrillation: Carolyn Wells remains in atrial fibrillation.    Stable .     2. HTN -   blood pressure mildly elevated.  We will start amlodipine 2.5 mg a day.  She will follow with Nicki Reaper in 3 months.  3. CKD -   she donated 1 of her kidneys to her husband.  She has mildly elevated creatinine.  5. Hyperlipidemia:  -  Stable     [name], MD  07/04/2018 10:09 AM    [facility] [address], [place], [place]  [address] Phone: [phone]; Fax: [phone]

## 2018-07-04 NOTE — Patient Instructions (Signed)
Medication Instructions:  Your physician has recommended you make the following change in your medication:   START Amlodipine (Norvasc) 2.5 mg once daily   Labwork: None Ordered   Testing/Procedures: None Ordered   Follow-Up: Your physician recommends that you schedule a follow-up appointment in: 3 months with Richardson Dopp, PA   If you need a refill on your cardiac medications before your next appointment, please call your pharmacy.   Thank you for choosing [facility]! Christen Bame, RN [phone]

## 2018-07-07 DIAGNOSIS — M25561 Pain in right knee: Secondary | ICD-10-CM | POA: Diagnosis not present

## 2018-07-16 DIAGNOSIS — M25561 Pain in right knee: Secondary | ICD-10-CM | POA: Diagnosis not present

## 2018-07-22 DIAGNOSIS — M25561 Pain in right knee: Secondary | ICD-10-CM | POA: Insufficient documentation

## 2018-08-22 ENCOUNTER — Telehealth: Payer: Self-pay

## 2018-08-22 NOTE — Telephone Encounter (Signed)
   [facility] Pre-operative Risk Assessment    Request for surgical clearance:  1. What type of surgery is being performed? LEFT L5-S1 INTERLAMINAR INJECTION   2. When is this surgery scheduled?  TBD   3. What type of clearance is required (medical clearance vs. Pharmacy clearance to hold med vs. Both)?  BOTH  4. Are there any medications that need to be held prior to surgery and how long? Xarelto 3 days   5. Practice name and name of physician performing surgery?  [facility]/ Dr Brien Few   6. What is your office phone number [phone]    7.   What is your office fax number [phone]  8.   Anesthesia type (None, local, MAC, general) ?    Legrand Como  Kaena Santori 08/22/2018, 4:50 PM  _________________________________________________________________   (provider comments below)

## 2018-08-29 ENCOUNTER — Encounter: Payer: Medicare HMO | Admitting: Internal Medicine

## 2018-08-29 NOTE — Telephone Encounter (Signed)
See below as FYI 

## 2018-08-29 NOTE — Telephone Encounter (Addendum)
Carolyn Wells is a 49 yoF on Xarelto for atrial fibrillation (CVasc: 4; age, HTN, female). PMHx also significant for CKD (solitary kidney); CrCl ~32 mL/min. Given CHADsVasc score and renal function, would recommend holding rivaroxaban for 3-4 days prior to procedure.

## 2018-08-29 NOTE — Telephone Encounter (Signed)
   Primary Cardiologist: [name], MD  Chart reviewed as part of pre-operative protocol coverage. Patient was contacted 08/29/2018 in reference to pre-operative risk assessment for pending surgery as outlined below.  Carolyn Wells was last seen 06/2018 by Dr. Acie Fredrickson. H/o chronic atrial fib (clarified lower in office note), HTN, HLD, CKD III with solitary kidney.  Since that day, Carolyn Wells has done well without any new cardiac symptoms. She is riding her bike regularly without any issue. Therefore, based on ACC/AHA guidelines, the patient would be at acceptable risk for the planned procedure without further cardiovascular testing.   Per pharmacist recommendation, per office protocol, "Given CHADsVasc score and renal function, would recommend holding rivaroxaban for 3-4 days prior to procedure." The range is because of her borderline renal function.  It also sounds like patient wants Dr. Acie Fredrickson in the loop so will forward as FYI.  I will route this recommendation to the requesting party via Epic fax function and remove from pre-op pool.  Please call with questions.  Charlie Pitter, PA-C 08/29/2018, 4:47 PM

## 2018-08-29 NOTE — Telephone Encounter (Signed)
   Primary Cardiologist: [name], MD  Chart reviewed as part of pre-operative protocol coverage. Will route to pharm for input on Xarelto.  Charlie Pitter, PA-C 08/29/2018, 3:17 PM

## 2018-09-07 DIAGNOSIS — M48062 Spinal stenosis, lumbar region with neurogenic claudication: Secondary | ICD-10-CM | POA: Diagnosis not present

## 2018-09-07 DIAGNOSIS — M5417 Radiculopathy, lumbosacral region: Secondary | ICD-10-CM | POA: Diagnosis not present

## 2018-09-12 ENCOUNTER — Other Ambulatory Visit: Payer: Self-pay | Admitting: Internal Medicine

## 2018-09-13 ENCOUNTER — Other Ambulatory Visit (INDEPENDENT_AMBULATORY_CARE_PROVIDER_SITE_OTHER): Payer: Medicare HMO

## 2018-09-13 DIAGNOSIS — Z0000 Encounter for general adult medical examination without abnormal findings: Secondary | ICD-10-CM

## 2018-09-13 LAB — URINALYSIS, ROUTINE W REFLEX MICROSCOPIC
BILIRUBIN URINE: NEGATIVE
HGB URINE DIPSTICK: NEGATIVE
Ketones, ur: NEGATIVE
LEUKOCYTES UA: NEGATIVE
NITRITE: NEGATIVE
PH: 5.5 (ref 5.0–8.0)
RBC / HPF: NONE SEEN (ref 0–?)
Specific Gravity, Urine: 1.02 (ref 1.000–1.030)
Total Protein, Urine: NEGATIVE
URINE GLUCOSE: NEGATIVE
Urobilinogen, UA: 0.2 (ref 0.0–1.0)

## 2018-09-13 LAB — HEPATIC FUNCTION PANEL
ALK PHOS: 55 U/L (ref 39–117)
ALT: 13 U/L (ref 0–35)
AST: 14 U/L (ref 0–37)
Albumin: 4.2 g/dL (ref 3.5–5.2)
Bilirubin, Direct: 0.2 mg/dL (ref 0.0–0.3)
TOTAL PROTEIN: 6.9 g/dL (ref 6.0–8.3)
Total Bilirubin: 0.9 mg/dL (ref 0.2–1.2)

## 2018-09-13 LAB — CBC WITH DIFFERENTIAL/PLATELET
BASOS ABS: 0.1 10*3/uL (ref 0.0–0.1)
Basophils Relative: 0.8 % (ref 0.0–3.0)
EOS ABS: 0.1 10*3/uL (ref 0.0–0.7)
Eosinophils Relative: 1.3 % (ref 0.0–5.0)
HEMATOCRIT: 40.9 % (ref 36.0–46.0)
HEMOGLOBIN: 13.9 g/dL (ref 12.0–15.0)
LYMPHS PCT: 21.8 % (ref 12.0–46.0)
Lymphs Abs: 1.7 10*3/uL (ref 0.7–4.0)
MCHC: 34.1 g/dL (ref 30.0–36.0)
MCV: 96.9 fl (ref 78.0–100.0)
MONOS PCT: 9.4 % (ref 3.0–12.0)
Monocytes Absolute: 0.7 10*3/uL (ref 0.1–1.0)
Neutro Abs: 5.2 10*3/uL (ref 1.4–7.7)
Neutrophils Relative %: 66.7 % (ref 43.0–77.0)
Platelets: 222 10*3/uL (ref 150.0–400.0)
RBC: 4.22 Mil/uL (ref 3.87–5.11)
RDW: 13.9 % (ref 11.5–15.5)
WBC: 7.8 10*3/uL (ref 4.0–10.5)

## 2018-09-13 LAB — BASIC METABOLIC PANEL
BUN: 23 mg/dL (ref 6–23)
CO2: 29 mEq/L (ref 19–32)
CREATININE: 1.52 mg/dL — AB (ref 0.40–1.20)
Calcium: 10 mg/dL (ref 8.4–10.5)
Chloride: 103 mEq/L (ref 96–112)
GFR: 35.13 mL/min — AB (ref >60.00)
Glucose, Bld: 93 mg/dL (ref 70–99)
Potassium: 4.6 mEq/L (ref 3.5–5.1)
Sodium: 140 mEq/L (ref 135–145)

## 2018-09-13 LAB — LIPID PANEL
CHOLESTEROL: 219 mg/dL — AB (ref 0–200)
HDL: 76 mg/dL (ref >39.00)
LDL Cholesterol: 114 mg/dL — ABNORMAL HIGH (ref 0–99)
NonHDL: 142.75
Total CHOL/HDL Ratio: 3
Triglycerides: 146 mg/dL (ref 0.0–149.0)
VLDL: 29.2 mg/dL (ref 0.0–40.0)

## 2018-09-13 LAB — TSH: TSH: 8 u[IU]/mL — ABNORMAL HIGH (ref 0.35–4.50)

## 2018-09-16 ENCOUNTER — Ambulatory Visit (INDEPENDENT_AMBULATORY_CARE_PROVIDER_SITE_OTHER): Payer: Medicare HMO | Admitting: Internal Medicine

## 2018-09-16 ENCOUNTER — Encounter: Payer: Self-pay | Admitting: Internal Medicine

## 2018-09-16 VITALS — BP 132/86 | HR 76 | Temp 97.9°F | Ht 66.0 in | Wt 152.0 lb

## 2018-09-16 DIAGNOSIS — E039 Hypothyroidism, unspecified: Secondary | ICD-10-CM | POA: Diagnosis not present

## 2018-09-16 DIAGNOSIS — Z0000 Encounter for general adult medical examination without abnormal findings: Secondary | ICD-10-CM

## 2018-09-16 DIAGNOSIS — E785 Hyperlipidemia, unspecified: Secondary | ICD-10-CM

## 2018-09-16 DIAGNOSIS — Z23 Encounter for immunization: Secondary | ICD-10-CM

## 2018-09-16 MED ORDER — PIMECROLIMUS 1 % EX CREA: TOPICAL_CREAM | Freq: Two times a day (BID) | CUTANEOUS | 1 refills | Status: DC

## 2018-09-16 NOTE — Progress Notes (Signed)
Subjective:    Patient ID: Carolyn Wells, female    DOB: November 21, 1940, 77 y.o.   MRN: [id number]  HPI   Here for wellness and f/u;  Overall doing ok;  Pt denies Chest pain, worsening SOB, DOE, wheezing, orthopnea, PND, worsening LE edema, palpitations, dizziness or syncope.  Pt denies neurological change such as new headache, facial or extremity weakness.  Pt denies polydipsia, polyuria, or low sugar symptoms. Pt states overall good compliance with treatment and medications, good tolerability, and has been trying to follow appropriate diet.  Pt denies worsening depressive symptoms, suicidal ideation or panic. No fever, night sweats, wt loss, loss of appetite, or other constitutional symptoms.  Pt states good ability with ADL's, has low fall risk, home safety reviewed and adequate, no other significant changes in hearing or vision, and only occasionally active with exercise. Trying to stay active with stat bike 3 miles per day.  Denies hyper or hypo thyroid symptoms such as voice, skin or hair change. Wt Readings from Last 3 Encounters:  09/16/18 152 lb (68.9 kg)  07/04/18 154 lb (69.9 kg)  05/05/18 151 lb (68.5 kg)   Past Medical History:  Diagnosis Date  . ABDOMINAL INCISIONAL HERNIA 08/13/2009   s/p repair  . Allergic rhinitis, cause unspecified 09/28/2011  . BRADYCARDIA 09/26/2010  . CKD (chronic kidney disease), stage III ([place]) 02/26/2015   Solitary kidney  . COLONIC POLYPS, HX OF 08/25/2007  . DIVERTICULOSIS, COLON 08/25/2007  . GASTROESOPHAGEAL REFLUX DISEASE 08/04/2007  . HYPERLIPIDEMIA 09/27/2010   not on Rx therapy  . HYPERTENSION 08/06/2007  . Hypothyroidism   . INTERNAL HEMORRHOIDS 08/04/2007  . LOW BACK PAIN 08/06/2007   scoliosis; DJD; gets injections  . OSTEOPENIA 08/04/2007  . PAF (paroxysmal atrial fibrillation) ([place])    a.  Echo 2/17:  Vigorous LVF, EF 65-70%, no RWMA, Ao sclerosis, trivial AI, trivial MR, trivial TR, PASP 23 mmHg  . Solitary kidney, acquired 09/28/2011     donated R kidney to husband   Past Surgical History:  Procedure Laterality Date  . LAMINECTOMY  1999   L4-5/lumbar  . NEPHRECTOMY     Right  . s/o incisional hernia repair     RUQ  . TONSILLECTOMY      reports that she has never smoked. She has never used smokeless tobacco. She reports that she does not drink alcohol or use drugs. family history includes Alzheimer's disease in her brother; Coronary artery disease (age of onset: 57) in her mother; Diabetes in her mother and other; Heart attack (age of onset: 61) in her father. Allergies  Allergen Reactions  . Ipratropium     Causes nasal bleeding  . Hydrocodone Nausea Only and Other (See Comments)    lethargy   Current Outpatient Medications on File Prior to Visit  Medication Sig Dispense Refill  . amLODipine (NORVASC) 2.5 MG tablet Take 1 tablet (2.5 mg total) by mouth daily. 90 tablet 3  . Ascorbic Acid (VITAMIN C) 1000 MG tablet Take 1,000 mg by mouth daily.    . Calcium Citrate (CITRACAL PO) Take 2 tablets by mouth daily.     [name] levothyroxine (SYNTHROID, LEVOTHROID) 25 MCG tablet TAKE 1 TABLET BY MOUTH ONCE DAILY 90 tablet 1  . LUMIGAN 0.01 % SOLN Place 1 drop into both eyes at bedtime.     . Multiple Vitamins-Minerals (WOMENS 50+ MULTI VITAMIN/MIN PO) Take 1 tablet by mouth daily.    . pantoprazole (PROTONIX) 40 MG tablet TAKE 1 TABLET BY MOUTH  ONCE DAILY 90 tablet 1  . Rivaroxaban (XARELTO) 15 MG TABS tablet Take 1 tablet (15 mg total) by mouth daily with supper. 90 tablet 3  . VITAMIN K PO Take 100 mcg by mouth daily.    . metoprolol tartrate (LOPRESSOR) 50 MG tablet Take 1 tablet (50 mg total) by mouth 2 (two) times daily. 180 tablet 3   No current facility-administered medications on file prior to visit.    Review of Systems Constitutional: Negative for other unusual diaphoresis, sweats, appetite or weight changes HENT: Negative for other worsening hearing loss, ear pain, facial swelling, mouth sores or neck stiffness.    Eyes: Negative for other worsening pain, redness or other visual disturbance.  Respiratory: Negative for other stridor or swelling Cardiovascular: Negative for other palpitations or other chest pain  Gastrointestinal: Negative for worsening diarrhea or loose stools, blood in stool, distention or other pain Genitourinary: Negative for hematuria, flank pain or other change in urine volume.  Musculoskeletal: Negative for myalgias or other joint swelling.  Skin: Negative for other color change, or other wound or worsening drainage.  Neurological: Negative for other syncope or numbness. Hematological: Negative for other adenopathy or swelling Psychiatric/Behavioral: Negative for hallucinations, other worsening agitation, SI, self-injury, or new decreased concentration All other system neg per pt    Objective:   Physical Exam BP 132/86   Pulse 76   Temp 97.9 F (36.6 C) (Oral)   Ht 5\' 6"  (1.676 m)   Wt 152 lb (68.9 kg)   SpO2 97%   BMI 24.53 kg/m  VS noted,  Constitutional: Pt is oriented to person, place, and time. Appears well-developed and well-nourished, in no significant distress and comfortable Head: Normocephalic and atraumatic  Eyes: Conjunctivae and EOM are normal. Pupils are equal, round, and reactive to light Right Ear: External ear normal without discharge Left Ear: External ear normal without discharge Nose: Nose without discharge or deformity Mouth/Throat: Oropharynx is without other ulcerations and moist  Neck: Normal range of motion. Neck supple. No JVD present. No tracheal deviation present or significant neck LA or mass Cardiovascular: Normal rate, regular rhythm, normal heart sounds and intact distal pulses.   Pulmonary/Chest: WOB normal and breath sounds without rales or wheezing  Abdominal: Soft. Bowel sounds are normal. NT. No HSM  Musculoskeletal: Normal range of motion. Exhibits no edema Lymphadenopathy: Has no other cervical adenopathy.  Neurological: Pt is  alert and oriented to person, place, and time. Pt has normal reflexes. No cranial nerve deficit. Motor grossly intact, Gait intact Skin: Skin is warm and dry. No rash noted or new ulcerations Psychiatric:  Has normal mood and affect. Behavior is normal without agitation No other exam findings Lab Results  Component Value Date   WBC 7.8 09/13/2018   HGB 13.9 09/13/2018   HCT 40.9 09/13/2018   PLT 222.0 09/13/2018   GLUCOSE 93 09/13/2018   CHOL 219 (H) 09/13/2018   TRIG 146.0 09/13/2018   HDL 76.00 09/13/2018   LDLDIRECT 117.7 09/26/2013   LDLCALC 114 (H) 09/13/2018   ALT 13 09/13/2018   AST 14 09/13/2018   NA 140 09/13/2018   K 4.6 09/13/2018   CL 103 09/13/2018   CREATININE 1.52 (H) 09/13/2018   BUN 23 09/13/2018   CO2 29 09/13/2018   TSH 8.00 (H) 09/13/2018   INR 1.03 10/22/2009       Assessment & Plan:

## 2018-09-16 NOTE — Patient Instructions (Addendum)

## 2018-09-17 DIAGNOSIS — E039 Hypothyroidism, unspecified: Secondary | ICD-10-CM | POA: Insufficient documentation

## 2018-09-17 NOTE — Assessment & Plan Note (Signed)

## 2018-09-17 NOTE — Assessment & Plan Note (Signed)
Cont diet, declines other statin or change in tx

## 2018-09-17 NOTE — Assessment & Plan Note (Signed)
Mild uncontrolled, ok for increase levothyroxine to 50 mcg qd

## 2018-09-19 DIAGNOSIS — H2513 Age-related nuclear cataract, bilateral: Secondary | ICD-10-CM | POA: Diagnosis not present

## 2018-09-19 DIAGNOSIS — H401122 Primary open-angle glaucoma, left eye, moderate stage: Secondary | ICD-10-CM | POA: Diagnosis not present

## 2018-09-19 DIAGNOSIS — H401112 Primary open-angle glaucoma, right eye, moderate stage: Secondary | ICD-10-CM | POA: Diagnosis not present

## 2018-09-19 DIAGNOSIS — H5000 Unspecified esotropia: Secondary | ICD-10-CM | POA: Diagnosis not present

## 2018-09-20 ENCOUNTER — Telehealth: Payer: Self-pay

## 2018-09-20 NOTE — Telephone Encounter (Signed)
Per [employer] approved 11/07/17-11/08/18

## 2018-10-04 NOTE — Progress Notes (Signed)
Cardiology Office Note   Date:  10/05/2018   ID:  Carolyn Wells, DOB 06-13-1941, MRN [id number]  PCP:  Biagio Borg, MD  Cardiologist: Dr. Grayland Jack, MD    Chief Complaint  Patient presents with  . Atrial Fibrillation  . Hyperlipidemia  . Hypertension  . Follow-up   History of Present Illness: Carolyn Wells is a 77 y.o. female who presents for follow up for paroxysmal atrial fibrillation, HLD and HTN, seen for Dr. Acie Fredrickson. Carolyn Wells has a hx of HTN, PAF, CKD stage III, and solitary kidney. She was last seen by Dr. Acie Fredrickson on 07/04/2018 in follow up for AF and was noted to be doing well. Her BP was elevated at 142/86 and she was started on low dose amlodipine at 2.5mg  daily with plans for close follow up.   She presents today and her BP is improved to 140/90 and was 132/86 at her PCP office visit on 09/16/18. She has no complaints of chest pain, palpitations, SOB, PND, dizziness, HA or fatigue. She is feeling well. Her TSH was quite elevated at her PCP visit, up to 8.00 and her Synthroid was increased to 55mcg. She reports that she is feeling better after the medication adjustment. She continues to ride her stationary bike 20 minutes daily. She recently held her Xarelto for 3 days per pharmacy direction for a back injection but has now resumed.  Overall she is doing really well.   Past Medical History:  Diagnosis Date  . ABDOMINAL INCISIONAL HERNIA 08/13/2009   s/p repair  . Allergic rhinitis, cause unspecified 09/28/2011  . BRADYCARDIA 09/26/2010  . CKD (chronic kidney disease), stage III ([place]) 02/26/2015   Solitary kidney  . COLONIC POLYPS, HX OF 08/25/2007  . DIVERTICULOSIS, COLON 08/25/2007  . GASTROESOPHAGEAL REFLUX DISEASE 08/04/2007  . HYPERLIPIDEMIA 09/27/2010   not on Rx therapy  . HYPERTENSION 08/06/2007  . Hypothyroidism   . INTERNAL HEMORRHOIDS 08/04/2007  . LOW BACK PAIN 08/06/2007   scoliosis; DJD; gets injections  . OSTEOPENIA 08/04/2007  . PAF  (paroxysmal atrial fibrillation) ([place])    a.  Echo 2/17:  Vigorous LVF, EF 65-70%, no RWMA, Ao sclerosis, trivial AI, trivial MR, trivial TR, PASP 23 mmHg  . Solitary kidney, acquired 09/28/2011   donated R kidney to husband    Past Surgical History:  Procedure Laterality Date  . LAMINECTOMY  1999   L4-5/lumbar  . NEPHRECTOMY     Right  . s/o incisional hernia repair     RUQ  . TONSILLECTOMY      Current Outpatient Medications  Medication Sig Dispense Refill  . amLODipine (NORVASC) 2.5 MG tablet Take 1 tablet (2.5 mg total) by mouth daily. 90 tablet 3  . Ascorbic Acid (VITAMIN C) 1000 MG tablet Take 1,000 mg by mouth daily.    . Calcium Citrate (CITRACAL PO) Take 2 tablets by mouth daily.     [name] levothyroxine (SYNTHROID, LEVOTHROID) 25 MCG tablet Take 25 mcg by mouth 2 (two) times daily.    [name] LUMIGAN 0.01 % SOLN Place 1 drop into both eyes at bedtime.     . metoprolol tartrate (LOPRESSOR) 50 MG tablet Take 1 tablet (50 mg total) by mouth 2 (two) times daily. 180 tablet 3  . Multiple Vitamins-Minerals (WOMENS 50+ MULTI VITAMIN/MIN PO) Take 1 tablet by mouth daily.    . pantoprazole (PROTONIX) 40 MG tablet TAKE 1 TABLET BY MOUTH ONCE DAILY 90 tablet 1  . pimecrolimus (ELIDEL) 1 % cream  Apply topically 2 (two) times daily. 60 g 1  . Rivaroxaban (XARELTO) 15 MG TABS tablet Take 1 tablet (15 mg total) by mouth daily with supper. 90 tablet 3  . VITAMIN K PO Take 100 mcg by mouth daily.     No current facility-administered medications for this visit.    Allergies:   Hydrocodone and Ipratropium   Social History:  The patient  reports that she has never smoked. She has never used smokeless tobacco. She reports that she does not drink alcohol or use drugs.   Family History:  The patient'sfamily history includes Alzheimer's disease in her brother; Coronary artery disease (age of onset: 71) in her mother; Diabetes in her mother and other; Heart attack (age of onset: 70) in her father.    ROS:  Please see the history of present illness. Otherwise, review of systems are positive for none. All other systems are reviewed and negative.   PHYSICAL EXAM: VS:  BP 140/90   Pulse 77   Ht 5\' 6"  (1.676 m)   Wt 154 lb (69.9 kg)   SpO2 98%   BMI 24.86 kg/m  , BMI Body mass index is 24.86 kg/m.  General: Well developed, well nourished, NAD Head: Normocephalic, atraumatic, clear, moist mucus membranes. Neck: Negative for carotid bruits. No JVD Lungs:Clear to ausculation bilaterally. No wheezes, rales, or rhonchi. Breathing is unlabored. Cardiovascular: Irregularly irregular with S1 S2. No murmurs, rubs, gallops, or LV heave appreciated. MSK: Strength and tone appear normal for age. 5/5 in all extremities Extremities: No edema. No clubbing or cyanosis. DP/PT pulses 2+ bilaterally Neuro: Alert and oriented. No focal deficits. No facial asymmetry. MAE spontaneously. Psych: Responds to questions appropriately with normal affect.     EKG:  EKG is ordered today. The ekg ordered today demonstrates Atrial fibrillation with HR 72   Recent Labs: 09/13/2018: ALT 13; BUN 23; Creatinine, Ser 1.52; Hemoglobin 13.9; Platelets 222.0; Potassium 4.6; Sodium 140; TSH 8.00   Lipid Panel    Component Value Date/Time   CHOL 219 (H) 09/13/2018 0920   TRIG 146.0 09/13/2018 0920   HDL 76.00 09/13/2018 0920   CHOLHDL 3 09/13/2018 0920   VLDL 29.2 09/13/2018 0920   LDLCALC 114 (H) 09/13/2018 0920   LDLDIRECT 117.7 09/26/2013 1420     Wt Readings from Last 3 Encounters:  10/05/18 154 lb (69.9 kg)  09/16/18 152 lb (68.9 kg)  07/04/18 154 lb (69.9 kg)     Other studies Reviewed: Additional studies/ records that were reviewed today include:  Echocardiogram 12/26/2015: Study Conclusions  - Left ventricle: The cavity size was normal. Wall thickness was   normal. Systolic function was vigorous. The estimated ejection   fraction was in the range of 65% to 70%. Wall motion was normal;    there were no regional wall motion abnormalities. The study is   not technically sufficient to allow evaluation of LV diastolic   function. - Aortic valve: Trileaflet. Sclerosis without stenosis. There was   trivial regurgitation. - Mitral valve: Mildly thickened leaflets . There was trivial   regurgitation. - Left atrium: The atrium was normal in size. - Tricuspid valve: There was trivial regurgitation. - Pulmonary arteries: PA peak pressure: 23 mm Hg (S). - Inferior vena cava: The vessel was normal in size. The   respirophasic diameter changes were in the normal range (= 50%),   consistent with normal central venous pressure.  Impressions:  - LVEF 65-70%, normal wall thickness, normal wall motion, normal   biatrial  size.  ASSESSMENT AND PLAN:  1. Essential HTN: -Stable today at 140/90 and pt reports lower pressures at home. BP at last PCP visit earlier this month was 132/86. -Low dose amlodipine 2.5mg  daily was added at last visit with Dr. Acie Fredrickson -Continue current regimen and will see her back in a year or sooner if issues arise   2. Chronic atrial fibrillation: -EKG today with AF with controlled HR at 72bpm  -Asymptomatic -Continue Xarelto, no reports of blood in stool or urine -Xarelto recently held for 4 days prior to back injection per pharmacy direction, however has no resumed.  -CHADsVasc score=4 (female, age, HTN)   3. CKD stage III: -Creatinine, 1.52 on 09/13/18 -Baseline appears to be in the 1.2-1.7 range -Continue with good PO hydration, follows with PCP    4. HLD: -Stable, last LDL 114 on 09/13/18 -Continue current regimen   5. Elevated TSH: -Pt had TSH drawn on 09/13/18 with TSH of 8.00 -Baseline appears to be in the 2.5-3.5 range -Was seen by PCP on 09/16/18 who reviewed the results with recommendations to increase synthroid to 73mcg QD  Current medicines are reviewed at length with the patient today.  The patient does not have concerns regarding  medicines.  The following changes have been made:  no change  Labs/ tests ordered today include: None  No orders of the defined types were placed in this encounter.  Disposition:   FU with Dr. Acie Fredrickson in 1 year or sooner if needed   Signed, Kathyrn Drown, NP  10/05/2018 10:13 AM    [address], [place], [place]  [address] Phone: [phone]; Fax: [phone]

## 2018-10-05 ENCOUNTER — Telehealth: Payer: Self-pay | Admitting: Internal Medicine

## 2018-10-05 ENCOUNTER — Encounter: Payer: Self-pay | Admitting: Physician Assistant

## 2018-10-05 ENCOUNTER — Ambulatory Visit (INDEPENDENT_AMBULATORY_CARE_PROVIDER_SITE_OTHER): Payer: Medicare HMO | Admitting: Cardiology

## 2018-10-05 VITALS — BP 140/90 | HR 77 | Ht 66.0 in | Wt 154.0 lb

## 2018-10-05 DIAGNOSIS — E785 Hyperlipidemia, unspecified: Secondary | ICD-10-CM

## 2018-10-05 DIAGNOSIS — I10 Essential (primary) hypertension: Secondary | ICD-10-CM

## 2018-10-05 DIAGNOSIS — I482 Chronic atrial fibrillation, unspecified: Secondary | ICD-10-CM | POA: Diagnosis not present

## 2018-10-05 DIAGNOSIS — E039 Hypothyroidism, unspecified: Secondary | ICD-10-CM

## 2018-10-05 MED ORDER — LEVOTHYROXINE SODIUM 50 MCG PO TABS: 50.0000 ug | ORAL_TABLET | Freq: Every day | ORAL | 3 refills | Status: DC

## 2018-10-05 NOTE — Addendum Note (Signed)
Addended by: Biagio Borg on: 10/05/2018 05:24 PM   Modules accepted: Orders

## 2018-10-05 NOTE — Telephone Encounter (Signed)
Copied from [place] [id number]. Topic: Quick Communication - Rx Refill/Question >> Oct 05, 2018  2:45 PM Burchel, Abbi R wrote: Medication: levothyroxine (SYNTHROID, LEVOTHROID) 25 MCG tablet  Pt states she was instructed to increase this medication to 33mcg.  She states Dr Jenny Reichmann told her to eake 2 tablets per day and she is running out early.  Please advise.   Preferred Pharmacy: [facility] [address], [place] [address] Phone: [phone] Fax: [phone]    Pt was advised that RX refills may take up to 3 business days. We ask that you follow-up with your pharmacy.

## 2018-10-05 NOTE — Patient Instructions (Signed)
Medication Instructions:   NONE ORDERED  TODAY  If you need a refill on your cardiac medications before your next appointment, please call your pharmacy.   Lab work: NONE ORDERED  TODAY  If you have labs (blood work) drawn today and your tests are completely normal, you will receive your results only by: [name] MyChart Message (if you have MyChart) OR . A paper copy in the mail If you have any lab test that is abnormal or we need to change your treatment, we will call you to review the results.  Testing/Procedures: NONE ORDERED  TODAY   Follow-Up: At [facility], you and your health needs are our priority.  As part of our continuing mission to provide you with exceptional heart care, we have created designated Provider Care Teams.  These Care Teams include your primary Cardiologist (physician) and Advanced Practice Providers (APPs -  Physician Assistants and Nurse Practitioners) who all work together to provide you with the care you need, when you need it. You will need a follow up appointment in: 12 months Please call our office 2 months in advance to schedule this appointment.  You may see [name], MD or one of the following Advanced Practice Providers on your designated Care Team: Richardson Dopp, PA-C [place], [place] . Daune Perch, NP  Any Other Special Instructions Will Be Listed Below (If Applicable).

## 2018-10-05 NOTE — Telephone Encounter (Signed)
LOV 09-26-18 with Dr. Jenny Reichmann / It is noted in the office note to increase synthroid to 39mcg / Last TSH was on 09/16/18 / Please send in an updated prescription

## 2018-10-05 NOTE — Telephone Encounter (Signed)
Done erx 

## 2018-11-30 DIAGNOSIS — I4891 Unspecified atrial fibrillation: Secondary | ICD-10-CM | POA: Diagnosis not present

## 2018-11-30 DIAGNOSIS — Q600 Renal agenesis, unilateral: Secondary | ICD-10-CM | POA: Diagnosis not present

## 2018-11-30 DIAGNOSIS — R809 Proteinuria, unspecified: Secondary | ICD-10-CM | POA: Diagnosis not present

## 2018-11-30 DIAGNOSIS — I129 Hypertensive chronic kidney disease with stage 1 through stage 4 chronic kidney disease, or unspecified chronic kidney disease: Secondary | ICD-10-CM | POA: Diagnosis not present

## 2018-11-30 DIAGNOSIS — N183 Chronic kidney disease, stage 3 (moderate): Secondary | ICD-10-CM | POA: Diagnosis not present

## 2018-11-30 DIAGNOSIS — E039 Hypothyroidism, unspecified: Secondary | ICD-10-CM | POA: Diagnosis not present

## 2018-12-07 ENCOUNTER — Other Ambulatory Visit: Payer: Self-pay | Admitting: Cardiovascular Disease

## 2019-01-02 DIAGNOSIS — H5032 Intermittent alternating esotropia: Secondary | ICD-10-CM | POA: Diagnosis not present

## 2019-01-02 DIAGNOSIS — H5213 Myopia, bilateral: Secondary | ICD-10-CM | POA: Diagnosis not present

## 2019-01-06 ENCOUNTER — Telehealth: Payer: Self-pay | Admitting: *Deleted

## 2019-01-06 NOTE — Telephone Encounter (Signed)
   [facility] Pre-operative Risk Assessment    Request for surgical clearance:  1. What type of surgery is being performed? EYE MUSCLE SURGERY   2. When is this surgery scheduled? TBD   3. What type of clearance is required (medical clearance vs. Pharmacy clearance to hold med vs. Both)? BOTH  4. Are there any medications that need to be held prior to surgery and how long?[name]   5. Practice name and name of physician performing surgery? [facility] DR. Jocelyn Lamer MARTIN   6. What is your office phone number [phone]     7.   What is your office fax number [phone]  8.   Anesthesia type (None, local, MAC, general) ? GENERAL    Carolyn Wells 01/06/2019, 1:19 PM  _________________________________________________________________   (provider comments below)

## 2019-01-06 NOTE — Telephone Encounter (Signed)
Patient with diagnosis of afib on xarelto for anticoagulation.    Procedure: EYE MUSCLE SURGERY Date of procedure: TBD  CHADS2-VASc score of  4 (CHF, HTN, AGE, DM2, stroke/tia x 2, CAD, AGE, female)  CrCl 67ml/min (using adjusted body weight)  Per office protocol, patient can hold Xarelto for 3 days prior to procedure.

## 2019-01-06 NOTE — Telephone Encounter (Signed)
   Primary Cardiologist: [name], MD  Chart reviewed as part of pre-operative protocol coverage. Patient was contacted 01/06/2019 in reference to pre-operative risk assessment for pending surgery as outlined below.  Carolyn Wells was last seen on 10/05/18 by Kathyrn Drown NP.  Since that day, Carolyn Wells has done well. She is taking her xarelto without problems. She reports no new cardiac symptoms and no anginal symptoms. She can complete greater than 4.0 METS - she rides an exercise bike for 3 miles several times weekly.   Per our pharmacy staff: Patient with diagnosis of afib on xarelto for anticoagulation.    Procedure: EYE MUSCLE SURGERY Date of procedure: TBD  CHADS2-VASc score of  4 (CHF, HTN, AGE, DM2, stroke/tia x 2, CAD, AGE, female)  CrCl 63ml/min (using adjusted body weight)  Per office protocol, patient can hold Xarelto for 3 days prior to procedure  Therefore, based on ACC/AHA guidelines, the patient would be at acceptable risk for the planned procedure without further cardiovascular testing.   I will route this recommendation to the requesting party via Epic fax function and remove from pre-op pool.  Please call with questions.  Tami Lin Kaneshia Cater, PA 01/06/2019, 3:17 PM

## 2019-01-24 DIAGNOSIS — H5005 Alternating esotropia: Secondary | ICD-10-CM | POA: Diagnosis not present

## 2019-02-22 DIAGNOSIS — L0390 Cellulitis, unspecified: Secondary | ICD-10-CM | POA: Diagnosis not present

## 2019-02-22 DIAGNOSIS — W64XXXA Exposure to other animate mechanical forces, initial encounter: Secondary | ICD-10-CM | POA: Diagnosis not present

## 2019-02-22 DIAGNOSIS — T148XXA Other injury of unspecified body region, initial encounter: Secondary | ICD-10-CM | POA: Diagnosis not present

## 2019-03-06 ENCOUNTER — Other Ambulatory Visit: Payer: Self-pay | Admitting: Internal Medicine

## 2019-03-30 ENCOUNTER — Ambulatory Visit: Payer: Self-pay | Admitting: Ophthalmology

## 2019-04-05 DIAGNOSIS — H5032 Intermittent alternating esotropia: Secondary | ICD-10-CM | POA: Diagnosis not present

## 2019-04-10 ENCOUNTER — Encounter (HOSPITAL_BASED_OUTPATIENT_CLINIC_OR_DEPARTMENT_OTHER): Payer: Self-pay | Admitting: *Deleted

## 2019-04-10 ENCOUNTER — Other Ambulatory Visit: Payer: Self-pay

## 2019-04-11 ENCOUNTER — Other Ambulatory Visit (HOSPITAL_COMMUNITY)
Admission: RE | Admit: 2019-04-11 | Discharge: 2019-04-11 | Disposition: A | Discharge status: Home / Self Care | Payer: Medicare HMO | Source: Ambulatory Visit | Attending: Ophthalmology | Admitting: Ophthalmology

## 2019-04-11 DIAGNOSIS — Z1159 Encounter for screening for other viral diseases: Secondary | ICD-10-CM | POA: Diagnosis not present

## 2019-04-12 LAB — NOVEL CORONAVIRUS, NAA (HOSP ORDER, SEND-OUT TO REF LAB; TAT 18-24 HRS): SARS-CoV-2, NAA: NOT DETECTED

## 2019-04-14 ENCOUNTER — Encounter (HOSPITAL_BASED_OUTPATIENT_CLINIC_OR_DEPARTMENT_OTHER)
Admission: RE | Disposition: A | Discharge status: Home / Self Care | Payer: Self-pay | Source: Home / Self Care | Attending: Ophthalmology

## 2019-04-14 ENCOUNTER — Ambulatory Visit (HOSPITAL_BASED_OUTPATIENT_CLINIC_OR_DEPARTMENT_OTHER): Payer: Medicare HMO | Admitting: Certified Registered"

## 2019-04-14 ENCOUNTER — Other Ambulatory Visit: Payer: Self-pay

## 2019-04-14 ENCOUNTER — Encounter (HOSPITAL_BASED_OUTPATIENT_CLINIC_OR_DEPARTMENT_OTHER): Payer: Self-pay

## 2019-04-14 ENCOUNTER — Ambulatory Visit (HOSPITAL_BASED_OUTPATIENT_CLINIC_OR_DEPARTMENT_OTHER)
Admission: RE | Admit: 2019-04-14 | Discharge: 2019-04-14 | Disposition: A | Discharge status: Home / Self Care | Payer: Medicare HMO | Attending: Ophthalmology | Admitting: Ophthalmology

## 2019-04-14 DIAGNOSIS — H5000 Unspecified esotropia: Secondary | ICD-10-CM | POA: Diagnosis not present

## 2019-04-14 DIAGNOSIS — H518 Other specified disorders of binocular movement: Secondary | ICD-10-CM | POA: Insufficient documentation

## 2019-04-14 DIAGNOSIS — H5032 Intermittent alternating esotropia: Secondary | ICD-10-CM | POA: Diagnosis not present

## 2019-04-14 DIAGNOSIS — N183 Chronic kidney disease, stage 3 (moderate): Secondary | ICD-10-CM | POA: Insufficient documentation

## 2019-04-14 DIAGNOSIS — I129 Hypertensive chronic kidney disease with stage 1 through stage 4 chronic kidney disease, or unspecified chronic kidney disease: Secondary | ICD-10-CM | POA: Diagnosis not present

## 2019-04-14 DIAGNOSIS — Z905 Acquired absence of kidney: Secondary | ICD-10-CM | POA: Diagnosis not present

## 2019-04-14 DIAGNOSIS — H532 Diplopia: Secondary | ICD-10-CM | POA: Diagnosis present

## 2019-04-14 DIAGNOSIS — K219 Gastro-esophageal reflux disease without esophagitis: Secondary | ICD-10-CM | POA: Insufficient documentation

## 2019-04-14 HISTORY — DX: Cardiac arrhythmia, unspecified: I49.9

## 2019-04-14 HISTORY — DX: Nausea with vomiting, unspecified: R11.2

## 2019-04-14 HISTORY — PX: STRABISMUS SURGERY: SHX218

## 2019-04-14 HISTORY — DX: Other complications of anesthesia, initial encounter: T88.59XA

## 2019-04-14 HISTORY — DX: Other specified postprocedural states: Z98.890

## 2019-04-14 SURGERY — STRABISMUS SURGERY, BILATERAL
Anesthesia: General | Site: Eye | Laterality: Bilateral

## 2019-04-14 MED ORDER — TOBRAMYCIN-DEXAMETHASONE 0.3-0.1 % OP OINT
TOPICAL_OINTMENT | OPHTHALMIC | Status: DC | PRN
  Administered 2019-04-14: 1 via OPHTHALMIC

## 2019-04-14 MED ORDER — SUCCINYLCHOLINE CHLORIDE 200 MG/10ML IV SOSY
PREFILLED_SYRINGE | INTRAVENOUS | Status: AC
  Filled 2019-04-14: qty 10

## 2019-04-14 MED ORDER — ACETAMINOPHEN 160 MG/5ML PO SOLN: 1000.0000 mg | Freq: Once | ORAL | Status: DC | PRN

## 2019-04-14 MED ORDER — OXYCODONE HCL 5 MG PO TABS: 5.0000 mg | ORAL_TABLET | Freq: Once | ORAL | Status: DC | PRN

## 2019-04-14 MED ORDER — SCOPOLAMINE 1 MG/3DAYS TD PT72
1.0000 | MEDICATED_PATCH | Freq: Once | TRANSDERMAL | Status: AC | PRN
  Administered 2019-04-14: 1 via TRANSDERMAL

## 2019-04-14 MED ORDER — EPHEDRINE 5 MG/ML INJ
INTRAVENOUS | Status: AC
  Filled 2019-04-14: qty 10

## 2019-04-14 MED ORDER — SCOPOLAMINE 1 MG/3DAYS TD PT72
MEDICATED_PATCH | TRANSDERMAL | Status: AC
  Filled 2019-04-14: qty 1

## 2019-04-14 MED ORDER — PROPOFOL 10 MG/ML IV BOLUS
INTRAVENOUS | Status: AC
  Filled 2019-04-14: qty 20

## 2019-04-14 MED ORDER — BUPIVACAINE HCL (PF) 0.5 % IJ SOLN
INTRAMUSCULAR | Status: AC
  Filled 2019-04-14: qty 30

## 2019-04-14 MED ORDER — DEXAMETHASONE SODIUM PHOSPHATE 10 MG/ML IJ SOLN
INTRAMUSCULAR | Status: AC
  Filled 2019-04-14: qty 1

## 2019-04-14 MED ORDER — DEXAMETHASONE SODIUM PHOSPHATE 10 MG/ML IJ SOLN
INTRAMUSCULAR | Status: AC
  Filled 2019-04-14: qty 2

## 2019-04-14 MED ORDER — PHENYLEPHRINE 40 MCG/ML (10ML) SYRINGE FOR IV PUSH (FOR BLOOD PRESSURE SUPPORT)
PREFILLED_SYRINGE | INTRAVENOUS | Status: AC
  Filled 2019-04-14: qty 10

## 2019-04-14 MED ORDER — PROPOFOL 10 MG/ML IV BOLUS
INTRAVENOUS | Status: DC | PRN
  Administered 2019-04-14: 20 mg via INTRAVENOUS
  Administered 2019-04-14: 30 mg via INTRAVENOUS
  Administered 2019-04-14: 20 mg via INTRAVENOUS
  Administered 2019-04-14: 50 mg via INTRAVENOUS
  Administered 2019-04-14: 130 mg via INTRAVENOUS

## 2019-04-14 MED ORDER — ACETAMINOPHEN 10 MG/ML IV SOLN
1000.0000 mg | Freq: Once | INTRAVENOUS | Status: DC | PRN
  Administered 2019-04-14: 1000 mg via INTRAVENOUS

## 2019-04-14 MED ORDER — FENTANYL CITRATE (PF) 100 MCG/2ML IJ SOLN
INTRAMUSCULAR | Status: AC
  Filled 2019-04-14: qty 2

## 2019-04-14 MED ORDER — FENTANYL CITRATE (PF) 100 MCG/2ML IJ SOLN: 50.0000 ug | INTRAMUSCULAR | Status: DC | PRN

## 2019-04-14 MED ORDER — TOBRAMYCIN-DEXAMETHASONE 0.3-0.1 % OP OINT
TOPICAL_OINTMENT | OPHTHALMIC | Status: AC
  Filled 2019-04-14: qty 3.5

## 2019-04-14 MED ORDER — FENTANYL CITRATE (PF) 100 MCG/2ML IJ SOLN
25.0000 ug | INTRAMUSCULAR | Status: DC | PRN
  Administered 2019-04-14 (×2): 25 ug via INTRAVENOUS

## 2019-04-14 MED ORDER — PROPOFOL 500 MG/50ML IV EMUL
INTRAVENOUS | Status: DC | PRN
  Administered 2019-04-14: 50 ug/kg/min via INTRAVENOUS

## 2019-04-14 MED ORDER — LIDOCAINE 2% (20 MG/ML) 5 ML SYRINGE
INTRAMUSCULAR | Status: AC
  Filled 2019-04-14: qty 5

## 2019-04-14 MED ORDER — BSS IO SOLN
INTRAOCULAR | Status: AC
  Filled 2019-04-14: qty 15

## 2019-04-14 MED ORDER — DEXAMETHASONE SODIUM PHOSPHATE 10 MG/ML IJ SOLN
INTRAMUSCULAR | Status: DC | PRN
  Administered 2019-04-14: 5 mg via INTRAVENOUS

## 2019-04-14 MED ORDER — ACETAMINOPHEN 10 MG/ML IV SOLN
INTRAVENOUS | Status: AC
  Filled 2019-04-14: qty 100

## 2019-04-14 MED ORDER — OXYCODONE HCL 5 MG/5ML PO SOLN: 5.0000 mg | Freq: Once | ORAL | Status: DC | PRN

## 2019-04-14 MED ORDER — ACETAMINOPHEN 500 MG PO TABS: 1000.0000 mg | ORAL_TABLET | Freq: Once | ORAL | Status: DC | PRN

## 2019-04-14 MED ORDER — ONDANSETRON HCL 4 MG/2ML IJ SOLN
INTRAMUSCULAR | Status: DC | PRN
  Administered 2019-04-14: 4 mg via INTRAVENOUS

## 2019-04-14 MED ORDER — LACTATED RINGERS IV SOLN
INTRAVENOUS | Status: DC
  Administered 2019-04-14: 07:00:00 via INTRAVENOUS

## 2019-04-14 MED ORDER — MIDAZOLAM HCL 2 MG/2ML IJ SOLN: 1.0000 mg | INTRAMUSCULAR | Status: DC | PRN

## 2019-04-14 MED ORDER — BUPIVACAINE HCL (PF) 0.25 % IJ SOLN
INTRAMUSCULAR | Status: AC
  Filled 2019-04-14: qty 30

## 2019-04-14 MED ORDER — LIDOCAINE 2% (20 MG/ML) 5 ML SYRINGE
INTRAMUSCULAR | Status: DC | PRN
  Administered 2019-04-14: 60 mg via INTRAVENOUS

## 2019-04-14 SURGICAL SUPPLY — 33 items
APL SRG 3 HI ABS STRL LF PLS (MISCELLANEOUS) ×1
APL SWBSTK 6 STRL LF DISP (MISCELLANEOUS) ×4
APPLICATOR COTTON TIP 6 STRL (MISCELLANEOUS) ×4 IMPLANT
APPLICATOR COTTON TIP 6IN STRL (MISCELLANEOUS) ×8
APPLICATOR DR MATTHEWS STRL (MISCELLANEOUS) ×2 IMPLANT
BNDG EYE OVAL (GAUZE/BANDAGES/DRESSINGS) IMPLANT
CAUTERY EYE LOW TEMP 1300F FIN (OPHTHALMIC RELATED) IMPLANT
COVER BACK TABLE REUSABLE LG (DRAPES) ×2 IMPLANT
COVER MAYO STAND REUSABLE (DRAPES) ×2 IMPLANT
COVER WAND RF STERILE (DRAPES) IMPLANT
DRAPE SURG 17X23 STRL (DRAPES) ×4 IMPLANT
DRAPE U-SHAPE 76X120 STRL (DRAPES) ×1 IMPLANT
GLOVE BIO SURGEON STRL SZ 6.5 (GLOVE) ×4 IMPLANT
GLOVE BIOGEL M STRL SZ7.5 (GLOVE) ×2 IMPLANT
GOWN STRL REUS W/ TWL LRG LVL3 (GOWN DISPOSABLE) ×1 IMPLANT
GOWN STRL REUS W/TWL LRG LVL3 (GOWN DISPOSABLE) ×4
GOWN STRL REUS W/TWL XL LVL3 (GOWN DISPOSABLE) ×2 IMPLANT
NS IRRIG 1000ML POUR BTL (IV SOLUTION) ×2 IMPLANT
PACK BASIN DAY SURGERY FS (CUSTOM PROCEDURE TRAY) ×2 IMPLANT
SHEET MEDIUM DRAPE 40X70 STRL (DRAPES) IMPLANT
SPEAR EYE SURG WECK-CEL (MISCELLANEOUS) ×4 IMPLANT
STRIP CLOSURE SKIN 1/4X4 (GAUZE/BANDAGES/DRESSINGS) IMPLANT
SUT 6 0 SILK T G140 8DA (SUTURE) ×1 IMPLANT
SUT MERSILENE 6-0 18IN S14 8MM (SUTURE)
SUT PLAIN 6 0 TG1408 (SUTURE) ×1 IMPLANT
SUT SILK 4 0 C 3 735G (SUTURE) IMPLANT
SUT VICRYL 6 0 S 28 (SUTURE) IMPLANT
SUT VICRYL ABS 6-0 S29 18IN (SUTURE) ×2 IMPLANT
SUTURE MERSLN 6-0 18IN S14 8MM (SUTURE) IMPLANT
SYR 10ML LL (SYRINGE) ×2 IMPLANT
SYR TB 1ML LL NO SAFETY (SYRINGE) ×2 IMPLANT
TOWEL GREEN STERILE FF (TOWEL DISPOSABLE) ×2 IMPLANT
TRAY DSU PREP LF (CUSTOM PROCEDURE TRAY) ×2 IMPLANT

## 2019-04-14 NOTE — Anesthesia Preprocedure Evaluation (Signed)
Anesthesia Evaluation  Patient identified by MRN, date of birth, ID band Patient awake    Reviewed: Allergy & Precautions, NPO status , Patient's Chart, lab work & pertinent test results, reviewed documented beta blocker date and time   History of Anesthesia Complications (+) PONV and history of anesthetic complications  Airway Mallampati: II  TM Distance: >3 FB Neck ROM: Full    Dental  (+) Dental Advisory Given   Pulmonary    breath sounds clear to auscultation       Cardiovascular hypertension, Pt. on medications and Pt. on home beta blockers (-) angina+ dysrhythmias Atrial Fibrillation  Rhythm:Irregular     Neuro/Psych PSYCHIATRIC DISORDERS Anxiety  Neuromuscular disease    GI/Hepatic GERD  Medicated and Controlled,  Endo/Other  Hypothyroidism   Renal/GU CRFRenal disease     Musculoskeletal  (+) Arthritis ,   Abdominal   Peds  Hematology xarelto for afib   Anesthesia Other Findings 2017 tte: Left ventricle: The cavity size was normal. Wall thickness was   normal. Systolic function was vigorous. The estimated ejection   fraction was in the range of 65% to 70%. Wall motion was normal;   there were no regional wall motion abnormalities. The study is   not technically sufficient to allow evaluation of LV diastolic   function. - Aortic valve: Trileaflet. Sclerosis without stenosis. There was   trivial regurgitation. - Mitral valve: Mildly thickened leaflets . There was trivial   regurgitation. - Left atrium: The atrium was normal in size. - Tricuspid valve: There was trivial regurgitation. - Pulmonary arteries: PA peak pressure: 23 mm Hg (S). - Inferior vena cava: The vessel was normal in size. The   respirophasic diameter changes were in the normal range (= 50%),   consistent with normal central venous pressure.   Reproductive/Obstetrics                            Anesthesia  Physical Anesthesia Plan  ASA: III  Anesthesia Plan: General   Post-op Pain Management:    Induction: Intravenous  PONV Risk Score and Plan: 4 or greater and Ondansetron, Dexamethasone, Propofol infusion and Scopolamine patch - Pre-op  Airway Management Planned: Oral ETT  Additional Equipment: None  Intra-op Plan:   Post-operative Plan: Extubation in OR  Informed Consent: I have reviewed the patients History and Physical, chart, labs and discussed the procedure including the risks, benefits and alternatives for the proposed anesthesia with the patient or authorized representative who has indicated his/her understanding and acceptance.     Dental advisory given  Plan Discussed with: CRNA and Surgeon  Anesthesia Plan Comments:         Anesthesia Quick Evaluation

## 2019-04-14 NOTE — H&P (Signed)
Date of examination:  04-05-19  Indication for surgery: to relieve diplopia  Pertinent past medical history:  Past Medical History:  Diagnosis Date  . ABDOMINAL INCISIONAL HERNIA 08/13/2009   s/p repair  . Allergic rhinitis, cause unspecified 09/28/2011  . BRADYCARDIA 09/26/2010  . CKD (chronic kidney disease), stage III ([place]) 02/26/2015   Solitary kidney  . COLONIC POLYPS, HX OF 08/25/2007  . Complication of anesthesia   . DIVERTICULOSIS, COLON 08/25/2007  . Dysrhythmia   . GASTROESOPHAGEAL REFLUX DISEASE 08/04/2007  . HYPERLIPIDEMIA 09/27/2010   not on Rx therapy  . HYPERTENSION 08/06/2007  . Hypothyroidism   . INTERNAL HEMORRHOIDS 08/04/2007  . LOW BACK PAIN 08/06/2007   scoliosis; DJD; gets injections  . OSTEOPENIA 08/04/2007  . PAF (paroxysmal atrial fibrillation) ([place])    a.  Echo 2/17:  Vigorous LVF, EF 65-70%, no RWMA, Ao sclerosis, trivial AI, trivial MR, trivial TR, PASP 23 mmHg  . PONV (postoperative nausea and vomiting)   . Solitary kidney, acquired 09/28/2011   donated R kidney to husband    Pertinent ocular history:  Divergence insufficiency ET, dipliopic  x 5 years, tried prism  Pertinent family history:  Family History  Problem Relation Age of Onset  . Coronary artery disease Mother 57       1st degree relative   . Diabetes Mother   . Heart attack Father 52  . Diabetes Other        1st degree relative  . Alzheimer's disease Brother     General:  Healthy appearing patient in no distress.    Eyes:    Acuity  cc OD 20/25  OS 20/25  External: Within normal limits     Anterior segment: Within normal limits     Motility:   ET=20 ET'=15  Fundus: Normal      Heart: Regular rate and rhythm without murmur     Lungs: Clear to auscultation     Abdomen: Soft, nontender, normal bowel sounds     Impression:Divergence insufficiency esotropia  Plan: Medial rectus muscle recession both eyes.  Increase dose because divergence insufficiency  Derry Skill

## 2019-04-14 NOTE — Anesthesia Postprocedure Evaluation (Signed)
Anesthesia Post Note  Patient: ALETHA ALLEBACH  Procedure(s) Performed: STRABISMUS REPAIR BILATERAL (Bilateral Eye)     Patient location during evaluation: PACU Anesthesia Type: General Level of consciousness: awake and alert Pain management: pain level controlled Vital Signs Assessment: post-procedure vital signs reviewed and stable Respiratory status: spontaneous breathing, nonlabored ventilation, respiratory function stable and patient connected to nasal cannula oxygen Cardiovascular status: blood pressure returned to baseline and stable Postop Assessment: no apparent nausea or vomiting Anesthetic complications: no    Last Vitals:  Vitals:   04/14/19 0945 04/14/19 1000  BP: (!) 154/97 (!) 136/99  Pulse: 60 79  Resp: 18 16  Temp:  37.1 C  SpO2: 99% 100%    Last Pain:  Vitals:   04/14/19 1000  TempSrc:   PainSc: 5                  Nesanel Aguila

## 2019-04-14 NOTE — Discharge Instructions (Signed)
Diet: Clear liquids, advance to soft foods then regular diet as tolerated by this evening.  Pain control:   1)  Acetaminophen 350-500 mg by mouth every 4-6 hours as needed for pain No Tylenol until 3:15pm!  2)  Ice pack/cold compress to operated eye(s) as desired  Eye medications:   Tobradex or Zylet eye ointment 1/2 inch in operated eye(s) twice a day for one week  Activity: No swimming for 1 week.  It is OK to let water run over the face and eyes while showering or taking a bath, even during the first week.  No other restriction on exercise or activity.  Call Dr. Janee Morn office [phone] with any problems or concerns.    Post Anesthesia Home Care Instructions  Activity: Get plenty of rest for the remainder of the day. A responsible individual must stay with you for 24 hours following the procedure.  For the next 24 hours, DO NOT: -Drive a car -Paediatric nurse -Drink alcoholic beverages -Take any medication unless instructed by your physician -Make any legal decisions or sign important papers.  Meals: Start with liquid foods such as gelatin or soup. Progress to regular foods as tolerated. Avoid greasy, spicy, heavy foods. If nausea and/or vomiting occur, drink only clear liquids until the nausea and/or vomiting subsides. Call your physician if vomiting continues.  Special Instructions/Symptoms: Your throat may feel dry or sore from the anesthesia or the breathing tube placed in your throat during surgery. If this causes discomfort, gargle with warm salt water. The discomfort should disappear within 24 hours.  If you had a scopolamine patch placed behind your ear for the management of post- operative nausea and/or vomiting:  1. The medication in the patch is effective for 72 hours, after which it should be removed.  Wrap patch in a tissue and discard in the trash. Wash hands thoroughly with soap and water. 2. You may remove the patch earlier than 72 hours if you experience  unpleasant side effects which may include dry mouth, dizziness or visual disturbances. 3. Avoid touching the patch. Wash your hands with soap and water after contact with the patch.

## 2019-04-14 NOTE — Anesthesia Procedure Notes (Signed)
Procedure Name: LMA Insertion Date/Time: 04/14/2019 7:48 AM Performed by: Myna Bright, CRNA Pre-anesthesia Checklist: Patient identified, Emergency Drugs available, Suction available and Patient being monitored Patient Re-evaluated:Patient Re-evaluated prior to induction Oxygen Delivery Method: Circle system utilized Preoxygenation: Pre-oxygenation with 100% oxygen Induction Type: IV induction Ventilation: Mask ventilation without difficulty LMA: LMA inserted LMA Size: 4.0 Number of attempts: 1 Placement Confirmation: positive ETCO2 and breath sounds checked- equal and bilateral Tube secured with: Tape Dental Injury: Teeth and Oropharynx as per pre-operative assessment

## 2019-04-14 NOTE — Transfer of Care (Signed)
Immediate Anesthesia Transfer of Care Note  Patient: Carolyn Wells  Procedure(s) Performed: STRABISMUS REPAIR BILATERAL (Bilateral Eye)  Patient Location: PACU  Anesthesia Type:General  Level of Consciousness: sedated and responds to stimulation  Airway & Oxygen Therapy: Patient Spontanous Breathing and Patient connected to face mask oxygen  Post-op Assessment: Report given to RN, Post -op Vital signs reviewed and stable and Patient moving all extremities  Post vital signs: Reviewed and stable  Last Vitals:  Vitals Value Taken Time  BP 133/73 04/14/2019  8:49 AM  Temp    Pulse 70 04/14/2019  8:51 AM  Resp 14 04/14/2019  8:51 AM  SpO2 100 % 04/14/2019  8:51 AM  Vitals shown include unvalidated device data.  Last Pain:  Vitals:   04/14/19 0639  TempSrc: Temporal  PainSc: 0-No pain         Complications: No apparent anesthesia complications

## 2019-04-14 NOTE — Op Note (Signed)
04/14/2019  8:55 AM  PATIENT:  Carolyn Wells  78 y.o. female  PRE-OPERATIVE DIAGNOSIS:  Esotropia, divergence insufficiency   POST-OPERATIVE DIAGNOSIS:  same  PROCEDURE:  Medial rectus muscle recession  5.0 mm both eyes  SURGEON:  Lorne Skeens.Annamaria Boots, M.D.   ANESTHESIA:   general  COMPLICATIONS:None  DESCRIPTION OF PROCEDURE: The patient was taken to the operating room where She was identified by me. General anesthesia was induced without difficulty after placement of appropriate monitors. The patient was prepped and draped in standard sterile fashion. A lid speculum was placed in the left eye.  A traction suture of 6-0 silk pas placed at the inferonasal limbus and used to draw the eye superotemorally. Through an inferonasal fornix incision through conjunctiva and Tenon's fascia, the left medial rectus muscle was engaged on a series of muscle hooks and cleared of its fascial attachments. The tendon was secured with a double-armed 6-0 Vicryl suture with a double locking bite at each border of the muscle, 1 mm from the insertion. The muscle was disinserted, and was reattached to sclera at a measured distance of 5 millimeters posterior to the original insertion, using direct scleral passes in crossed swords fashion.  The suture ends were tied securely after the position of the muscle had been checked and found to be accurate. The conjunctival incision was closed with a single 6-0 plain gut suture.  A conjunctival rent near the nasal limbus was closed with a single 6-0 plain gut suture.  The speculum was transferred to the right eye, where the same procedure was performed, again effecting a 5.0 millimeters recession of the medial rectus muscle. TobraDex ointment was placed in both eye(s). The patient was awakened without difficulty and taken to the recovery room in stable condition, having suffered no intraoperative or immediate postoperative complications.  Lorne Skeens. Jarica Plass M.D.    PATIENT  DISPOSITION:  PACU - hemodynamically stable.

## 2019-04-17 ENCOUNTER — Encounter (HOSPITAL_BASED_OUTPATIENT_CLINIC_OR_DEPARTMENT_OTHER): Payer: Self-pay | Admitting: Ophthalmology

## 2019-06-02 ENCOUNTER — Other Ambulatory Visit: Payer: Self-pay | Admitting: Cardiovascular Disease

## 2019-06-03 NOTE — Telephone Encounter (Signed)
69f 69.9kg Scr 1.52 09/13/18 ccr 33 Lovw/nahser 07/04/18

## 2019-06-13 ENCOUNTER — Telehealth: Payer: Self-pay

## 2019-06-13 NOTE — Telephone Encounter (Signed)
   [facility] Pre-operative Risk Assessment    Request for surgical clearance:  1. What type of surgery is being performed? Lumbar ESI   2. When is this surgery scheduled? TBD   3. What type of clearance is required (medical clearance vs. Pharmacy clearance to hold med vs. Both)? Pharmacy  4. Are there any medications that need to be held prior to surgery and how long? Xarelto 3 days   5. Practice name and name of physician performing surgery? [facility]   Dr Marlaine Hind   6. What is your office phone number [phone]    7.   What is your office fax number [phone]  8.   Anesthesia type (None, local, MAC, general) ? local   Frederik Schmidt 06/13/2019, 9:34 AM  _________________________________________________________________   (provider comments below)

## 2019-06-13 NOTE — Telephone Encounter (Signed)
Pharm please address Xarelto

## 2019-06-13 NOTE — Telephone Encounter (Signed)
   Primary Cardiologist: [name], MD  Chart reviewed as part of pre-operative protocol coverage. Patient was contacted 06/13/2019 in reference to pre-operative risk assessment for pending surgery as outlined below.  ZAKAIYA LARES was last seen on 10/06/19 by Kathyrn Drown NP.  Since that day, JERNI SELMER has done well - on phone call no chest pain and no SOB is able to meed 4 METS of activity without problems. She does have PAF and HTN.   pt on 15 mg of Xarelto daily and it is ok to hold for 3 days prior to spinal procedure per protocol.   Therefore, based on ACC/AHA guidelines, the patient would be at acceptable risk for the planned procedure without further cardiovascular testing.   I will route this recommendation to the requesting party via Epic fax function and remove from pre-op pool.  Please call with questions.  Cecilie Kicks, NP 06/13/2019, 11:50 AM

## 2019-06-13 NOTE — Telephone Encounter (Signed)
Pt takes Xarelto for afib with CHADS2VASc score of 4 (age x2, sex, HTN). CrCl 37mL/min, pt on appropriately reduced dose of Xarelto 15mg  daily. Ok to hold Xarelto for 3 days prior to spinal procedure per protocol.

## 2019-06-27 DIAGNOSIS — M48062 Spinal stenosis, lumbar region with neurogenic claudication: Secondary | ICD-10-CM | POA: Diagnosis not present

## 2019-06-27 DIAGNOSIS — M5417 Radiculopathy, lumbosacral region: Secondary | ICD-10-CM | POA: Diagnosis not present

## 2019-08-04 DIAGNOSIS — Q600 Renal agenesis, unilateral: Secondary | ICD-10-CM | POA: Diagnosis not present

## 2019-08-04 DIAGNOSIS — E785 Hyperlipidemia, unspecified: Secondary | ICD-10-CM | POA: Diagnosis not present

## 2019-08-04 DIAGNOSIS — N183 Chronic kidney disease, stage 3 (moderate): Secondary | ICD-10-CM | POA: Diagnosis not present

## 2019-08-04 DIAGNOSIS — Z23 Encounter for immunization: Secondary | ICD-10-CM | POA: Diagnosis not present

## 2019-08-04 DIAGNOSIS — D472 Monoclonal gammopathy: Secondary | ICD-10-CM | POA: Diagnosis not present

## 2019-08-04 DIAGNOSIS — K219 Gastro-esophageal reflux disease without esophagitis: Secondary | ICD-10-CM | POA: Diagnosis not present

## 2019-08-04 DIAGNOSIS — I129 Hypertensive chronic kidney disease with stage 1 through stage 4 chronic kidney disease, or unspecified chronic kidney disease: Secondary | ICD-10-CM | POA: Diagnosis not present

## 2019-08-04 DIAGNOSIS — R809 Proteinuria, unspecified: Secondary | ICD-10-CM | POA: Diagnosis not present

## 2019-08-11 DIAGNOSIS — H04123 Dry eye syndrome of bilateral lacrimal glands: Secondary | ICD-10-CM | POA: Diagnosis not present

## 2019-08-11 DIAGNOSIS — H524 Presbyopia: Secondary | ICD-10-CM | POA: Diagnosis not present

## 2019-08-11 DIAGNOSIS — H5213 Myopia, bilateral: Secondary | ICD-10-CM | POA: Diagnosis not present

## 2019-08-11 DIAGNOSIS — H401112 Primary open-angle glaucoma, right eye, moderate stage: Secondary | ICD-10-CM | POA: Diagnosis not present

## 2019-08-14 DIAGNOSIS — L821 Other seborrheic keratosis: Secondary | ICD-10-CM | POA: Diagnosis not present

## 2019-08-14 DIAGNOSIS — L820 Inflamed seborrheic keratosis: Secondary | ICD-10-CM | POA: Diagnosis not present

## 2019-08-29 ENCOUNTER — Other Ambulatory Visit: Payer: Self-pay | Admitting: Internal Medicine

## 2019-08-29 ENCOUNTER — Other Ambulatory Visit: Payer: Self-pay | Admitting: Cardiovascular Disease

## 2019-08-30 NOTE — Telephone Encounter (Signed)
Xarelto 15mg  refill request received. Pt is 78 years old, weight-69.4kg, Crea-1.34 on 11/30/2018 via [facility], last seen by Kathyrn Drown on 10/05/2018, Diagnosis-Afib, CrCl-37.71ml/min; Dose is appropriate based on dosing criteria. Will send in refill to requested pharmacy.

## 2019-09-06 DIAGNOSIS — L308 Other specified dermatitis: Secondary | ICD-10-CM | POA: Diagnosis not present

## 2019-09-06 DIAGNOSIS — L570 Actinic keratosis: Secondary | ICD-10-CM | POA: Diagnosis not present

## 2019-09-06 DIAGNOSIS — X32XXXD Exposure to sunlight, subsequent encounter: Secondary | ICD-10-CM | POA: Diagnosis not present

## 2019-09-19 ENCOUNTER — Encounter: Payer: Self-pay | Admitting: Internal Medicine

## 2019-09-19 ENCOUNTER — Other Ambulatory Visit: Payer: Self-pay

## 2019-09-19 ENCOUNTER — Other Ambulatory Visit (INDEPENDENT_AMBULATORY_CARE_PROVIDER_SITE_OTHER): Payer: Medicare HMO

## 2019-09-19 ENCOUNTER — Ambulatory Visit (INDEPENDENT_AMBULATORY_CARE_PROVIDER_SITE_OTHER): Payer: Medicare HMO | Admitting: Internal Medicine

## 2019-09-19 VITALS — BP 124/82 | HR 142 | Temp 97.6°F | Ht 66.5 in | Wt 152.0 lb

## 2019-09-19 DIAGNOSIS — Z23 Encounter for immunization: Secondary | ICD-10-CM

## 2019-09-19 DIAGNOSIS — Z0001 Encounter for general adult medical examination with abnormal findings: Secondary | ICD-10-CM

## 2019-09-19 DIAGNOSIS — R21 Rash and other nonspecific skin eruption: Secondary | ICD-10-CM

## 2019-09-19 DIAGNOSIS — E611 Iron deficiency: Secondary | ICD-10-CM

## 2019-09-19 DIAGNOSIS — Z0000 Encounter for general adult medical examination without abnormal findings: Secondary | ICD-10-CM

## 2019-09-19 DIAGNOSIS — R739 Hyperglycemia, unspecified: Secondary | ICD-10-CM

## 2019-09-19 DIAGNOSIS — E559 Vitamin D deficiency, unspecified: Secondary | ICD-10-CM | POA: Diagnosis not present

## 2019-09-19 DIAGNOSIS — E538 Deficiency of other specified B group vitamins: Secondary | ICD-10-CM

## 2019-09-19 DIAGNOSIS — N183 Chronic kidney disease, stage 3 unspecified: Secondary | ICD-10-CM

## 2019-09-19 DIAGNOSIS — E039 Hypothyroidism, unspecified: Secondary | ICD-10-CM

## 2019-09-19 DIAGNOSIS — E785 Hyperlipidemia, unspecified: Secondary | ICD-10-CM | POA: Diagnosis not present

## 2019-09-19 DIAGNOSIS — I10 Essential (primary) hypertension: Secondary | ICD-10-CM

## 2019-09-19 LAB — LIPID PANEL
Cholesterol: 230 mg/dL — ABNORMAL HIGH (ref 0–200)
HDL: 68.3 mg/dL (ref >39.00)
LDL Cholesterol: 124 mg/dL — ABNORMAL HIGH (ref 0–99)
NonHDL: 162.11
Total CHOL/HDL Ratio: 3
Triglycerides: 189 mg/dL — ABNORMAL HIGH (ref 0.0–149.0)
VLDL: 37.8 mg/dL (ref 0.0–40.0)

## 2019-09-19 LAB — IBC PANEL
Iron: 174 ug/dL — ABNORMAL HIGH (ref 42–145)
Saturation Ratios: 60.3 % — ABNORMAL HIGH (ref 20.0–50.0)
Transferrin: 206 mg/dL — ABNORMAL LOW (ref 212.0–360.0)

## 2019-09-19 LAB — VITAMIN D 25 HYDROXY (VIT D DEFICIENCY, FRACTURES): VITD: 55.15 ng/mL (ref 30.00–100.00)

## 2019-09-19 LAB — HEMOGLOBIN A1C: Hgb A1c MFr Bld: 5.5 % (ref 4.6–6.5)

## 2019-09-19 LAB — VITAMIN B12: Vitamin B-12: 839 pg/mL (ref 211–911)

## 2019-09-19 LAB — TSH: TSH: 2.28 u[IU]/mL (ref 0.35–4.50)

## 2019-09-19 MED ORDER — ZOSTER VAC RECOMB ADJUVANTED 50 MCG/0.5ML IM SUSR: 0.5000 mL | Freq: Once | INTRAMUSCULAR | 1 refills | Status: AC

## 2019-09-19 MED ORDER — CLOTRIMAZOLE-BETAMETHASONE 1-0.05 % EX CREA: TOPICAL_CREAM | CUTANEOUS | 1 refills | Status: DC

## 2019-09-19 NOTE — Patient Instructions (Addendum)
You had the Tdap tetanus shot today  Your shingles shot prescription was sent to the pharmacy  Please take all new medication as prescribed - the cream  Please continue all other medications as before, and refills have been done if requested.  Please have the pharmacy call with any other refills you may need.  Please continue your efforts at being more active, low cholesterol diet, and weight control.  You are otherwise up to date with prevention measures today.  Please keep your appointments with your specialists as you may have planned  Please go to the LAB in the Basement (turn left off the elevator) for the tests to be done today  You will be contacted by phone if any changes need to be made immediately.  Otherwise, you will receive a letter about your results with an explanation, but please check with MyChart first.  Please remember to sign up for MyChart if you have not done so, as this will be important to you in the future with finding out test results, communicating by private email, and scheduling acute appointments online when needed.  Please return in 1 year for your yearly visit, or sooner if needed, with Lab testing done 3-5 days before

## 2019-09-19 NOTE — Progress Notes (Signed)
Subjective:    Patient ID: Carolyn Wells, female    DOB: 1941-07-18, 78 y.o.   MRN: [id number]  HPI  Here for wellness and f/u;  Overall doing ok;  Pt denies Chest pain, worsening SOB, DOE, wheezing, orthopnea, PND, worsening LE edema, palpitations, dizziness or syncope.  Pt denies neurological change such as new headache, facial or extremity weakness.  Pt denies polydipsia, polyuria, or low sugar symptoms. Pt states overall good compliance with treatment and medications, good tolerability, and has been trying to follow appropriate diet.  Pt denies worsening depressive symptoms, suicidal ideation or panic. No fever, night sweats, wt loss, loss of appetite, or other constitutional symptoms.  Pt states good ability with ADL's, has low fall risk, home safety reviewed and adequate, no other significant changes in hearing or vision, and only occasionally active with exercise. Also c/o right buttock rash for several wks, non tender, kind of scaly and red, not better with neosporin, and no pain.   Nothing else seems to make better or worse.  No recent insect.   Past Medical History:  Diagnosis Date  . ABDOMINAL INCISIONAL HERNIA 08/13/2009   s/p repair  . Allergic rhinitis, cause unspecified 09/28/2011  . BRADYCARDIA 09/26/2010  . CKD (chronic kidney disease), stage III 02/26/2015   Solitary kidney  . COLONIC POLYPS, HX OF 08/25/2007  . Complication of anesthesia   . DIVERTICULOSIS, COLON 08/25/2007  . Dysrhythmia   . GASTROESOPHAGEAL REFLUX DISEASE 08/04/2007  . HYPERLIPIDEMIA 09/27/2010   not on Rx therapy  . HYPERTENSION 08/06/2007  . Hypothyroidism   . INTERNAL HEMORRHOIDS 08/04/2007  . LOW BACK PAIN 08/06/2007   scoliosis; DJD; gets injections  . OSTEOPENIA 08/04/2007  . PAF (paroxysmal atrial fibrillation) ([place])    a.  Echo 2/17:  Vigorous LVF, EF 65-70%, no RWMA, Ao sclerosis, trivial AI, trivial MR, trivial TR, PASP 23 mmHg  . PONV (postoperative nausea and vomiting)   . Solitary  kidney, acquired 09/28/2011   donated R kidney to husband   Past Surgical History:  Procedure Laterality Date  . LAMINECTOMY  1999   L4-5/lumbar  . NEPHRECTOMY     Right  . s/o incisional hernia repair     RUQ  . STRABISMUS SURGERY Bilateral 04/14/2019   Procedure: STRABISMUS REPAIR BILATERAL;  Surgeon: Everitt Amber, MD;  Location: [facility];  Service: Ophthalmology;  Laterality: Bilateral;  . TONSILLECTOMY      reports that she has never smoked. She has never used smokeless tobacco. She reports that she does not drink alcohol or use drugs. family history includes Alzheimer's disease in her brother; Coronary artery disease (age of onset: 31) in her mother; Diabetes in her mother and another family member; Heart attack (age of onset: 77) in her father. Allergies  Allergen Reactions  . Hydrocodone Nausea Only and Other (See Comments)    lethargy  . Ipratropium     Causes nasal bleeding   Current Outpatient Medications on File Prior to Visit  Medication Sig Dispense Refill  . amLODipine (NORVASC) 2.5 MG tablet Take 1 tablet (2.5 mg total) by mouth daily. Please keep upcoming appt in December with Dr. Acie Fredrickson for future refills. Thank you 90 tablet 0  . Ascorbic Acid (VITAMIN C) 1000 MG tablet Take 1,000 mg by mouth daily.    . Calcium Citrate (CITRACAL PO) Take 2 tablets by mouth daily.     [name] levothyroxine (SYNTHROID) 50 MCG tablet TAKE 1 TABLET BY MOUTH ONCE DAILY BEFORE  BREAKFAST  90 tablet 0  . LUMIGAN 0.01 % SOLN Place 1 drop into both eyes at bedtime.     . metoprolol tartrate (LOPRESSOR) 50 MG tablet TAKE 1 TABLET BY MOUTH TWICE DAILY 180 tablet 3  . Multiple Vitamins-Minerals (WOMENS 50+ MULTI VITAMIN/MIN PO) Take 1 tablet by mouth daily.    . pantoprazole (PROTONIX) 40 MG tablet Take 1 tablet by mouth once daily 90 tablet 3  . pimecrolimus (ELIDEL) 1 % cream Apply topically 2 (two) times daily. 60 g 1  . VITAMIN K PO Take 100 mcg by mouth daily.    Alveda Reasons 15  MG TABS tablet TAKE 1 TABLET BY MOUTH ONCE DAILY WITH SUPPER 90 tablet 1   No current facility-administered medications on file prior to visit.    Review of Systems Constitutional: Negative for other unusual diaphoresis, sweats, appetite or weight changes HENT: Negative for other worsening hearing loss, ear pain, facial swelling, mouth sores or neck stiffness.   Eyes: Negative for other worsening pain, redness or other visual disturbance.  Respiratory: Negative for other stridor or swelling Cardiovascular: Negative for other palpitations or other chest pain  Gastrointestinal: Negative for worsening diarrhea or loose stools, blood in stool, distention or other pain Genitourinary: Negative for hematuria, flank pain or other change in urine volume.  Musculoskeletal: Negative for myalgias or other joint swelling.  Skin: Negative for other color change, or other wound or worsening drainage.  Neurological: Negative for other syncope or numbness. Hematological: Negative for other adenopathy or swelling Psychiatric/Behavioral: Negative for hallucinations, other worsening agitation, SI, self-injury, or new decreased concentration All otherwise neg per pt    Objective:   Physical Exam BP 124/82   Pulse (!) 142   Temp 97.6 F (36.4 C) (Oral)   Ht 5' 6.5" (1.689 m)   Wt 152 lb (68.9 kg)   SpO2 98%   BMI 24.17 kg/m  VS noted,  Constitutional: Pt is oriented to person, place, and time. Appears well-developed and well-nourished, in no significant distress and comfortable Head: Normocephalic and atraumatic  Eyes: Conjunctivae and EOM are normal. Pupils are equal, round, and reactive to light Right Ear: External ear normal without discharge Left Ear: External ear normal without discharge Nose: Nose without discharge or deformity Mouth/Throat: Oropharynx is without other ulcerations and moist  Neck: Normal range of motion. Neck supple. No JVD present. No tracheal deviation present or significant  neck LA or mass Cardiovascular: Normal rate, regular rhythm, normal heart sounds and intact distal pulses.   Pulmonary/Chest: WOB normal and breath sounds without rales or wheezing  Abdominal: Soft. Bowel sounds are normal. NT. No HSM  Musculoskeletal: Normal range of motion. Exhibits no edema Lymphadenopathy: Has no other cervical adenopathy.  Neurological: Pt is alert and oriented to person, place, and time. Pt has normal reflexes. No cranial nerve deficit. Motor grossly intact, Gait intact Skin: Skin is warm and dry. 3 cm area right buttock scaly rash noted non tender red,, no new ulcerations Psychiatric:  Has normal mood and affect. Behavior is normal without agitation All otherwise neg per pt Lab Results  Component Value Date   WBC 7.8 09/13/2018   HGB 13.9 09/13/2018   HCT 40.9 09/13/2018   PLT 222.0 09/13/2018   GLUCOSE 93 09/13/2018   CHOL 230 (H) 09/19/2019   TRIG 189.0 (H) 09/19/2019   HDL 68.30 09/19/2019   LDLDIRECT 117.7 09/26/2013   LDLCALC 124 (H) 09/19/2019   ALT 13 09/13/2018   AST 14 09/13/2018   NA 140  09/13/2018   K 4.6 09/13/2018   CL 103 09/13/2018   CREATININE 1.52 (H) 09/13/2018   BUN 23 09/13/2018   CO2 29 09/13/2018   TSH 2.28 09/19/2019   INR 1.03 10/22/2009   HGBA1C 5.5 09/19/2019       Assessment & Plan:

## 2019-09-21 ENCOUNTER — Other Ambulatory Visit: Payer: Self-pay | Admitting: Internal Medicine

## 2019-09-21 ENCOUNTER — Encounter: Payer: Self-pay | Admitting: Internal Medicine

## 2019-09-21 MED ORDER — ATORVASTATIN CALCIUM 10 MG PO TABS: 10.0000 mg | ORAL_TABLET | Freq: Every day | ORAL | 3 refills | Status: DC

## 2019-09-24 ENCOUNTER — Encounter: Payer: Self-pay | Admitting: Internal Medicine

## 2019-09-24 DIAGNOSIS — R21 Rash and other nonspecific skin eruption: Secondary | ICD-10-CM | POA: Insufficient documentation

## 2019-09-24 NOTE — Assessment & Plan Note (Signed)
stable overall by history and exam, recent data reviewed with pt, and pt to continue medical treatment as before,  to f/u any worsening symptoms or concerns  

## 2019-09-24 NOTE — Assessment & Plan Note (Addendum)
?   Etiology, for lotrisone asd,  to f/u any worsening symptoms or concerns  In addition to the time spent performing CPE, I spent an additional 25 minutes face to face,in which greater than 50% of this time was spent in counseling and coordination of care for patient's acute illness as documented, including the differential dx, treatment, further evaluation and other management of rash, ckd, htn, hypothryodism, HLD

## 2019-10-26 ENCOUNTER — Encounter: Payer: Self-pay | Admitting: Cardiovascular Disease

## 2019-10-26 NOTE — Progress Notes (Signed)
Cardiology Office Note:    Date:  10/27/2019   ID:  Carolyn Wells, DOB [birth year], MRN [id number]  PCP:  Biagio Borg, MD  Cardiologist:    Liam Rogers   Electrophysiologist:  n/a  Referring MD:  Dr. Scarlette Shorts  Problem List  1. Paroxysmal atrial fib  2. Hyperlipidemia 3. Essential hypertension 4. CKD - donated a kidney to her husband   Chief Complaint  Patient presents with  . Atrial Fibrillation  . Hypertension       Carolyn Wells is a 78 y.o. female with a hx of HTN, HL, atrial fib  CKD stage 3 (solitary kidney).   CHADS2-VASc=4 (female, age 55, HTN).  This patients CHA2DS2-VASc Score and unadjusted Ischemic Stroke Rate (% per year) is equal to 4.8 % stroke rate/year from a score of 4 Above score calculated as 1 point each if present [CHF, HTN, DM, Vascular=MI/PAD/Aortic Plaque, Age if 65-74, or Female] Above score calculated as 2 points each if present [Age > 75, or Stroke/TIA/TE]  Mar 24, 2016:  Feels very well. No symptoms.     still active.   Exercises regularly  Checks BP regularly ,  Her reading are all very good.  Is on metoprolol for rate control and mild HTN.    Nov. 20, 2017:  Carolyn Wells is doing great She remains asymptomatic. She is still in atrial ablation but cannot tell that her heart beat is irregular.  April 14, 2017:  Carolyn Wells is doing well Held her Xarelto for 7 days prior to back injection Staying active.   Rides her stationary bike   December 21, 2017: Carolyn Wells is seen today for follow-up of her atrial fibrillation.   She also has a history of hypertension and hyperlipidemia. Is on chronic anticoagulation Has a runny nose for the past 2 years .  Has had an extensive work up - no answers yet   July 04, 2018: Feels well BP is a bit elevated. Today  Has CKD Still eats lots of salt   October 27, 2019:  Carolyn Wells is seen today for follow-up of her hypertension and persistent atrial fibrillation.  She has mild chronic kidney  disease. Cannot tell that her HR is irreg.  Able to get out and exercise normally .   Has some back aches.  Rides her bike - no CP or dyspnea    Past Medical History:  Diagnosis Date  . ABDOMINAL INCISIONAL HERNIA 08/13/2009   s/p repair  . Allergic rhinitis, cause unspecified 09/28/2011  . BRADYCARDIA 09/26/2010  . CKD (chronic kidney disease), stage III 02/26/2015   Solitary kidney  . COLONIC POLYPS, HX OF 08/25/2007  . Complication of anesthesia   . DIVERTICULOSIS, COLON 08/25/2007  . Dysrhythmia   . GASTROESOPHAGEAL REFLUX DISEASE 08/04/2007  . HYPERLIPIDEMIA 09/27/2010   not on Rx therapy  . HYPERTENSION 08/06/2007  . Hypothyroidism   . INTERNAL HEMORRHOIDS 08/04/2007  . LOW BACK PAIN 08/06/2007   scoliosis; DJD; gets injections  . OSTEOPENIA 08/04/2007  . PAF (paroxysmal atrial fibrillation) ([place])    a.  Echo 2/17:  Vigorous LVF, EF 65-70%, no RWMA, Ao sclerosis, trivial AI, trivial MR, trivial TR, PASP 23 mmHg  . PONV (postoperative nausea and vomiting)   . Solitary kidney, acquired 09/28/2011   donated R kidney to husband    Past Surgical History:  Procedure Laterality Date  . LAMINECTOMY  1999   L4-5/lumbar  . NEPHRECTOMY     Right  . s/o incisional hernia  repair     RUQ  . STRABISMUS SURGERY Bilateral 04/14/2019   Procedure: STRABISMUS REPAIR BILATERAL;  Surgeon: Everitt Amber, MD;  Location: [facility];  Service: Ophthalmology;  Laterality: Bilateral;  . TONSILLECTOMY      Current Medications: Outpatient Medications Prior to Visit  Medication Sig Dispense Refill  . amLODipine (NORVASC) 2.5 MG tablet Take 1 tablet (2.5 mg total) by mouth daily. Please keep upcoming appt in December with Dr. Acie Fredrickson for future refills. Thank you 90 tablet 0  . Ascorbic Acid (VITAMIN C) 1000 MG tablet Take 1,000 mg by mouth daily.    [name] atorvastatin (LIPITOR) 10 MG tablet Take 1 tablet (10 mg total) by mouth daily. 90 tablet 3  . Calcium Citrate (CITRACAL PO) Take 2  tablets by mouth daily.     . clotrimazole-betamethasone (LOTRISONE) cream Use as directed twice per day as needed 15 g 1  . levothyroxine (SYNTHROID) 50 MCG tablet TAKE 1 TABLET BY MOUTH ONCE DAILY BEFORE  BREAKFAST 90 tablet 0  . LUMIGAN 0.01 % SOLN Place 1 drop into both eyes at bedtime.     . metoprolol tartrate (LOPRESSOR) 50 MG tablet TAKE 1 TABLET BY MOUTH TWICE DAILY 180 tablet 3  . Multiple Vitamins-Minerals (WOMENS 50+ MULTI VITAMIN/MIN PO) Take 1 tablet by mouth daily.    . pantoprazole (PROTONIX) 40 MG tablet Take 1 tablet by mouth once daily 90 tablet 3  . pimecrolimus (ELIDEL) 1 % cream Apply topically 2 (two) times daily. 60 g 1  . VITAMIN K PO Take 100 mcg by mouth daily.    Alveda Reasons 15 MG TABS tablet TAKE 1 TABLET BY MOUTH ONCE DAILY WITH SUPPER 90 tablet 1   No facility-administered medications prior to visit.     Allergies:   Hydrocodone and Ipratropium   Social History   Socioeconomic History  . Marital status: Married    Spouse name: Not on file  . Number of children: 2  . Years of education: Not on file  . Highest education level: Not on file  Occupational History  . 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    Employer: RETIRED  Tobacco Use  . Smoking status: Never Smoker  . Smokeless tobacco: Never Used  Substance and Sexual Activity  . Alcohol use: No    Alcohol/week: 0.0 standard drinks  . Drug use: No  . Sexual activity: Not on file  Other Topics Concern  . Not on file  Social History Narrative   Husband disabled, renal failure, DM, HTN.   Originally from [place], [place] >> came to [place] 1966 (husband in [employer])   Social Determinants of Radio broadcast assistant Strain:   . Difficulty of Paying Living Expenses: Not on file  Food Insecurity:   . Worried About Charity fundraiser in the Last Year: Not on file  . Ran Out of Food in the Last Year: Not on file  Transportation Needs:   . Lack of Transportation (Medical): Not on file  . Lack  of Transportation (Non-Medical): Not on file  Physical Activity:   . Days of Exercise per Week: Not on file  . Minutes of Exercise per Session: Not on file  Stress:   . Feeling of Stress : Not on file  Social Connections:   . Frequency of Communication with Friends and Family: Not on file  . Frequency of Social Gatherings with Friends and Family: Not on file  . Attends Religious Services: Not on file  .  Active Member of Clubs or Organizations: Not on file  . Attends Archivist Meetings: Not on file  . Marital Status: Not on file     Family History:  The patient's family history includes Alzheimer's disease in her brother; Coronary artery disease (age of onset: 62) in her mother; Diabetes in her mother and another family member; Heart attack (age of onset: 31) in her father.   ROS:   As noted in current hx.   Physical Exam: Blood pressure 130/80, pulse 73, height 5' 6.5" (1.689 m), weight 151 lb 3.2 oz (68.6 kg), SpO2 98 %.  GEN:  Well nourished, well developed in no acute distress HEENT: Normal NECK: No JVD; No carotid bruits LYMPHATICS: No lymphadenopathy CARDIAC: Irreg. Irreg. , no murmurs, rubs, gallops RESPIRATORY:  Clear to auscultation without rales, wheezing or rhonchi  ABDOMEN: Soft, non-tender, non-distended MUSCULOSKELETAL:  No edema; No deformity  SKIN: Warm and dry NEUROLOGIC:  Alert and oriented x 3  ECG :  Atrial fib at HR of 73.   No ST or T wave changes.   Wt Readings from Last 3 Encounters:  10/27/19 151 lb 3.2 oz (68.6 kg)  09/19/19 152 lb (68.9 kg)  04/14/19 153 lb (69.4 kg)      Studies/Labs Reviewed:     EKG:    Recent Labs: 09/19/2019: TSH 2.28   Recent Lipid Panel    Component Value Date/Time   CHOL 230 (H) 09/19/2019 1418   TRIG 189.0 (H) 09/19/2019 1418   HDL 68.30 09/19/2019 1418   CHOLHDL 3 09/19/2019 1418   VLDL 37.8 09/19/2019 1418   LDLCALC 124 (H) 09/19/2019 1418   LDLDIRECT 117.7 09/26/2013 1420     Assessment /  PLAN:      1.  Chronic atrial fibrillation: His H fibrillation.  Her rate is very well controlled and she really cannot tell that her heart is beating irregularly.  We will continue with a strategy of rate control and anticoagulation. Stable .     2. HTN -blood pressure seems to be well controlled.  Continue current medications..  3. CKD -   she donated one of her kidneys to her husband for renal transplant.  Renal function seems to be stable.  5. Hyperlipidemia:  -Lipid levels are stable.   [name], MD  10/27/2019 3:41 PM    [facility] [place], [place], [place]  [address] Phone: [phone]; Fax: [phone]

## 2019-10-27 ENCOUNTER — Other Ambulatory Visit: Payer: Self-pay

## 2019-10-27 ENCOUNTER — Ambulatory Visit (INDEPENDENT_AMBULATORY_CARE_PROVIDER_SITE_OTHER): Payer: Medicare HMO | Admitting: Cardiovascular Disease

## 2019-10-27 ENCOUNTER — Encounter: Payer: Self-pay | Admitting: Cardiovascular Disease

## 2019-10-27 VITALS — BP 130/80 | HR 73 | Ht 66.5 in | Wt 151.2 lb

## 2019-10-27 DIAGNOSIS — I4811 Longstanding persistent atrial fibrillation: Secondary | ICD-10-CM

## 2019-10-27 DIAGNOSIS — I10 Essential (primary) hypertension: Secondary | ICD-10-CM

## 2019-10-27 NOTE — Patient Instructions (Signed)
Medication Instructions:  Your physician recommends that you continue on your current medications as directed. Please refer to the Current Medication list given to you today.  *If you need a refill on your cardiac medications before your next appointment, please call your pharmacy*   Lab Work: None Ordered   Testing/Procedures: None Ordered   Follow-Up: At [facility], you and your health needs are our priority.  As part of our continuing mission to provide you with exceptional heart care, we have created designated Provider Care Teams.  These Care Teams include your primary Cardiologist (physician) and Advanced Practice Providers (APPs -  Physician Assistants and Nurse Practitioners) who all work together to provide you with the care you need, when you need it.  Your next appointment:   6 month(s)  The format for your next appointment:   Either In Person or Virtual  Provider:   You may see Philip Nahser, MD or one of the following Advanced Practice Providers on your designated Care Team:    Scott Weaver, PA-C  Vin Bhagat, PA-C  Janine Hammond, NP    

## 2019-11-15 DIAGNOSIS — H401122 Primary open-angle glaucoma, left eye, moderate stage: Secondary | ICD-10-CM | POA: Diagnosis not present

## 2019-11-15 DIAGNOSIS — H2513 Age-related nuclear cataract, bilateral: Secondary | ICD-10-CM | POA: Diagnosis not present

## 2019-11-15 DIAGNOSIS — H401112 Primary open-angle glaucoma, right eye, moderate stage: Secondary | ICD-10-CM | POA: Diagnosis not present

## 2019-11-27 ENCOUNTER — Other Ambulatory Visit: Payer: Self-pay | Admitting: Internal Medicine

## 2019-11-27 ENCOUNTER — Other Ambulatory Visit: Payer: Self-pay | Admitting: Cardiovascular Disease

## 2020-01-11 ENCOUNTER — Ambulatory Visit: Payer: Medicare HMO | Attending: Internal Medicine

## 2020-01-11 DIAGNOSIS — Z23 Encounter for immunization: Secondary | ICD-10-CM

## 2020-01-11 NOTE — Progress Notes (Signed)
   Covid-19 Vaccination Clinic  Name:  Carolyn Wells    MRN: [id number] DOB: [birth year]  01/11/2020  Ms. [name] was observed post Covid-19 immunization for 15 minutes without incident. She was provided with Vaccine Information Sheet and instruction to access the V-Safe system.   Ms. [name] was instructed to call 911 with any severe reactions post vaccine: [name] Difficulty breathing  . Swelling of face and throat  . A fast heartbeat  . A bad rash all over body  . Dizziness and weakness   Immunizations Administered    Name Date Dose VIS Date Route   Pfizer COVID-19 Vaccine 01/11/2020 12:26 PM 0.3 mL 10/20/2019 Intramuscular   Manufacturer: [place]   Lot: [id number]   [place]: [id number]

## 2020-02-06 ENCOUNTER — Ambulatory Visit: Payer: Medicare HMO | Attending: Internal Medicine

## 2020-02-06 DIAGNOSIS — Z23 Encounter for immunization: Secondary | ICD-10-CM

## 2020-02-06 NOTE — Progress Notes (Signed)
   Covid-19 Vaccination Clinic  Name:  Carolyn Wells    MRN: [id number] DOB: 25-Jan-1941  02/06/2020  Carolyn Wells was observed post Covid-19 immunization for 15 minutes without incident. She was provided with Vaccine Information Sheet and instruction to access the V-Safe system.   Carolyn Wells was instructed to call 911 with any severe reactions post vaccine: [name] Difficulty breathing  . Swelling of face and throat  . A fast heartbeat  . A bad rash all over body  . Dizziness and weakness   Immunizations Administered    Name Date Dose VIS Date Route   Pfizer COVID-19 Vaccine 02/06/2020  3:12 PM 0.3 mL 10/20/2019 Intramuscular   Manufacturer: [employer]   Lot: [id number]   [place]: [id number]

## 2020-02-23 ENCOUNTER — Other Ambulatory Visit: Payer: Self-pay | Admitting: Internal Medicine

## 2020-02-23 ENCOUNTER — Other Ambulatory Visit: Payer: Self-pay | Admitting: Cardiovascular Disease

## 2020-02-23 NOTE — Telephone Encounter (Signed)
Prescription refill request for Xarelto received.  Last office visit: Nahser, 10/27/2019 Weight: 68.6 kg  Age: 79 y.o. Scr: 1.55, 08/04/2019 CrCl: 31 ml/min   Prescription refill sent.

## 2020-04-28 NOTE — Progress Notes (Signed)
Cardiology Office Note:    Date:  04/29/2020   ID:  Carolyn Wells, DOB 12-04-1940, MRN [id number]  PCP:  Biagio Borg, MD  Cardiologist:  [name], MD   Electrophysiologist:  None   Referring MD: Biagio Borg, MD   Chief Complaint:  Follow-up (AFib)    Patient Profile:    Carolyn Wells is a 79 y.o. female with:   Permanent atrial fibrillation  CHA2DS2-VASc=4 (female, age x 2, hypertension) >> Rivaroxaban   Chronic kidney disease  Solitary kidney  Hypertension  Hyperlipidemia  Prior CV studies: Echocardiogram 12/26/2015 EF 65-70, normal wall motion, aortic sclerosis (no stenosis), trivial AI, trivial MR, trivial TR, PASP 23  History of Present Illness:    Carolyn Wells was last seen in clinic by Dr. Acie Fredrickson in December 2020.  She returns for follow-up.  She is here alone. Since last seen, she has not had chest pain, syncope, orthopnea, leg swelling.  She has not had any melena or hematochezia.  She gets short of breath with some activities.  This is chronic and unchanged.  She has noted some neck discomfort and lightheadedness associated with this.       Past Medical History:  Diagnosis Date  . ABDOMINAL INCISIONAL HERNIA 08/13/2009   s/p repair  . Allergic rhinitis, cause unspecified 09/28/2011  . BRADYCARDIA 09/26/2010  . CKD (chronic kidney disease), stage III 02/26/2015   Solitary kidney  . COLONIC POLYPS, HX OF 08/25/2007  . Complication of anesthesia   . DIVERTICULOSIS, COLON 08/25/2007  . Dysrhythmia   . GASTROESOPHAGEAL REFLUX DISEASE 08/04/2007  . HYPERLIPIDEMIA 09/27/2010   not on Rx therapy  . HYPERTENSION 08/06/2007  . Hypothyroidism   . INTERNAL HEMORRHOIDS 08/04/2007  . LOW BACK PAIN 08/06/2007   scoliosis; DJD; gets injections  . OSTEOPENIA 08/04/2007  . PAF (paroxysmal atrial fibrillation) ([place])    a.  Echo 2/17:  Vigorous LVF, EF 65-70%, no RWMA, Ao sclerosis, trivial AI, trivial MR, trivial TR, PASP 23 mmHg  . PONV (postoperative  nausea and vomiting)   . Solitary kidney, acquired 09/28/2011   donated R kidney to husband    Current Medications: Current Meds  Medication Sig  . amLODipine (NORVASC) 2.5 MG tablet Take 1 tablet (2.5 mg total) by mouth daily.  . Ascorbic Acid (VITAMIN C) 1000 MG tablet Take 1,000 mg by mouth daily.  . Calcium Citrate (CITRACAL PO) Take 2 tablets by mouth daily.   [name] levothyroxine (SYNTHROID) 50 MCG tablet TAKE 1 TABLET BY MOUTH ONCE DAILY BEFORE BREAKFAST  . LUMIGAN 0.01 % SOLN Place 1 drop into both eyes at bedtime.   . metoprolol succinate (TOPROL-XL) 50 MG 24 hr tablet Take 50 mg by mouth daily.  . Multiple Vitamins-Minerals (WOMENS 50+ MULTI VITAMIN/MIN PO) Take 1 tablet by mouth daily.  . pantoprazole (PROTONIX) 40 MG tablet Take 1 tablet by mouth once daily  . VITAMIN K PO Take 100 mcg by mouth daily.  Alveda Reasons 15 MG TABS tablet TAKE 1 TABLET BY MOUTH ONCE DAILY WITH SUPPER     Allergies:   Hydrocodone and Ipratropium   Social History   Tobacco Use  . Smoking status: Never Smoker  . Smokeless tobacco: Never Used  Substance Use Topics  . Alcohol use: No    Alcohol/week: 0.0 standard drinks  . Drug use: No     Family Hx: The patient's family history includes Alzheimer's disease in her brother; Coronary artery disease (age of onset: 91) in her mother;  Diabetes in her mother and another family member; Heart attack (age of onset: 42) in her father.  ROS   EKGs/Labs/Other Test Reviewed:    EKG:  EKG is   ordered today.  The ekg ordered today demonstrates atrial fibrillation, HR 87, normal axis, septal Q waves, QTc 418, no changes.   Recent Labs: 09/19/2019: TSH 2.28   Recent Lipid Panel Lab Results  Component Value Date/Time   CHOL 230 (H) 09/19/2019 02:18 PM   TRIG 189.0 (H) 09/19/2019 02:18 PM   HDL 68.30 09/19/2019 02:18 PM   CHOLHDL 3 09/19/2019 02:18 PM   LDLCALC 124 (H) 09/19/2019 02:18 PM   LDLDIRECT 117.7 09/26/2013 02:20 PM    Physical Exam:    VS:   BP 112/80   Pulse 87   Ht 5' 6.25" (1.683 m)   Wt 151 lb 1.9 oz (68.5 kg)   SpO2 98%   BMI 24.21 kg/m     Wt Readings from Last 3 Encounters:  04/29/20 151 lb 1.9 oz (68.5 kg)  10/27/19 151 lb 3.2 oz (68.6 kg)  09/19/19 152 lb (68.9 kg)     Constitutional:      Appearance: Healthy appearance. Not in distress.  Neck:     Thyroid: No thyromegaly.     Vascular: JVD normal.  Pulmonary:     Effort: Pulmonary effort is normal.     Breath sounds: No wheezing. No rales.  Cardiovascular:     Normal rate. Irregularly irregular rhythm. Normal S1. Normal S2.     Murmurs: There is no murmur.  Edema:    Peripheral edema present.    Pretibial: bilateral trace edema of the pretibial area. Abdominal:     Palpations: Abdomen is soft. There is no hepatomegaly.  Skin:    General: Skin is warm and dry.  Neurological:     Mental Status: Alert and oriented to person, place and time.     Cranial Nerves: Cranial nerves are intact.       ASSESSMENT & PLAN:    1. Chronic atrial fibrillation (HCC) HR is well controlled.  She is tolerating anticoagulation.  Creatinine Clearance 32.  She is on the appropriate dose of Rivaroxaban at 15 mg once daily.  Continue Rivaroxaban, Metoprolol succinate.    2. Essential hypertension The patient's blood pressure is controlled on her current regimen.  Continue current therapy.    3. Neck pain I have asked her to follow up with primary care for evaluation.    Dispo:  Return in about 6 months (around 10/29/2020) for Routine Follow Up, w/ Dr. Acie Fredrickson, in person.   Medication Adjustments/Labs and Tests Ordered: Current medicines are reviewed at length with the patient today.  Concerns regarding medicines are outlined above.  Tests Ordered: Orders Placed This Encounter  Procedures  . EKG 12-Lead   Medication Changes: No orders of the defined types were placed in this encounter.   Signed, Richardson Dopp, PA-C  04/29/2020 12:49 PM    [facility] [place], [place], [place]  [address] Phone: [phone]; Fax: [phone]

## 2020-04-29 ENCOUNTER — Encounter: Payer: Self-pay | Admitting: Physician Assistant

## 2020-04-29 ENCOUNTER — Ambulatory Visit (INDEPENDENT_AMBULATORY_CARE_PROVIDER_SITE_OTHER): Payer: Medicare HMO | Admitting: Physician Assistant

## 2020-04-29 ENCOUNTER — Other Ambulatory Visit: Payer: Self-pay

## 2020-04-29 VITALS — BP 112/80 | HR 87 | Ht 66.25 in | Wt 151.1 lb

## 2020-04-29 DIAGNOSIS — I10 Essential (primary) hypertension: Secondary | ICD-10-CM | POA: Diagnosis not present

## 2020-04-29 DIAGNOSIS — M542 Cervicalgia: Secondary | ICD-10-CM | POA: Diagnosis not present

## 2020-04-29 DIAGNOSIS — I482 Chronic atrial fibrillation, unspecified: Secondary | ICD-10-CM

## 2020-04-29 NOTE — Patient Instructions (Addendum)
Medication Instructions:   Your physician recommends that you continue on your current medications as directed. Please refer to the Current Medication list given to you today.  *If you need a refill on your cardiac medications before your next appointment, please call your pharmacy*  Lab Work:  None ordered today  Testing/Procedures:  None ordered today  Follow-Up:  On 10/21/20 at 11:20AM with [name], MD

## 2020-08-21 ENCOUNTER — Other Ambulatory Visit: Payer: Self-pay | Admitting: Internal Medicine

## 2020-08-21 ENCOUNTER — Other Ambulatory Visit: Payer: Self-pay | Admitting: Cardiovascular Disease

## 2020-08-21 NOTE — Telephone Encounter (Signed)
Prescription refill request for Xarelto received.   Last office visit:04/29/2020 Weight: 68.5 kg Age: 79 yo Scr: 1.56, 3/17/202, [facility] CrCl: 31 ml/min   Prescription refill sent.

## 2020-09-13 ENCOUNTER — Other Ambulatory Visit: Payer: Self-pay | Admitting: Internal Medicine

## 2020-09-19 ENCOUNTER — Encounter: Payer: Medicare HMO | Admitting: Internal Medicine

## 2020-10-08 ENCOUNTER — Other Ambulatory Visit: Payer: Self-pay

## 2020-10-09 ENCOUNTER — Ambulatory Visit (INDEPENDENT_AMBULATORY_CARE_PROVIDER_SITE_OTHER): Payer: Medicare HMO | Admitting: Internal Medicine

## 2020-10-09 ENCOUNTER — Encounter: Payer: Self-pay | Admitting: Internal Medicine

## 2020-10-09 ENCOUNTER — Encounter (INDEPENDENT_AMBULATORY_CARE_PROVIDER_SITE_OTHER): Payer: Self-pay

## 2020-10-09 VITALS — BP 120/76 | HR 81 | Temp 98.1°F | Ht 66.0 in | Wt 150.0 lb

## 2020-10-09 DIAGNOSIS — N183 Chronic kidney disease, stage 3 unspecified: Secondary | ICD-10-CM

## 2020-10-09 DIAGNOSIS — G8929 Other chronic pain: Secondary | ICD-10-CM

## 2020-10-09 DIAGNOSIS — I10 Essential (primary) hypertension: Secondary | ICD-10-CM | POA: Diagnosis not present

## 2020-10-09 DIAGNOSIS — Z0001 Encounter for general adult medical examination with abnormal findings: Secondary | ICD-10-CM

## 2020-10-09 DIAGNOSIS — Z0000 Encounter for general adult medical examination without abnormal findings: Secondary | ICD-10-CM

## 2020-10-09 DIAGNOSIS — M545 Low back pain, unspecified: Secondary | ICD-10-CM | POA: Diagnosis not present

## 2020-10-09 DIAGNOSIS — E7849 Other hyperlipidemia: Secondary | ICD-10-CM

## 2020-10-09 DIAGNOSIS — Z23 Encounter for immunization: Secondary | ICD-10-CM

## 2020-10-09 DIAGNOSIS — E039 Hypothyroidism, unspecified: Secondary | ICD-10-CM

## 2020-10-09 MED ORDER — LEVOTHYROXINE SODIUM 50 MCG PO TABS
ORAL_TABLET | ORAL | 3 refills | Status: DC
Start: 2020-10-09 — End: 2020-11-14

## 2020-10-09 MED ORDER — AMLODIPINE BESYLATE 2.5 MG PO TABS
2.5000 mg | ORAL_TABLET | Freq: Every day | ORAL | 3 refills | Status: DC
Start: 2020-10-09 — End: 2021-11-12

## 2020-10-09 MED ORDER — PANTOPRAZOLE SODIUM 40 MG PO TBEC
40.0000 mg | DELAYED_RELEASE_TABLET | Freq: Every day | ORAL | 3 refills | Status: DC
Start: 2020-10-09 — End: 2021-11-12

## 2020-10-09 MED ORDER — METOPROLOL SUCCINATE ER 50 MG PO TB24
50.0000 mg | ORAL_TABLET | Freq: Every day | ORAL | 3 refills | Status: DC
Start: 2020-10-09 — End: 2021-12-05

## 2020-10-09 NOTE — Patient Instructions (Addendum)
You had the flu shot today  You will be contacted regarding the referral for: Physical Therapy  Please continue all other medications as before, and refills have been done if requested.  Please have the pharmacy call with any other refills you may need.  Please continue your efforts at being more active, low cholesterol diet, and weight control.  You are otherwise up to date with prevention measures today.  Please keep your appointments with your specialists as you may have planned  We can hold on further lab work today  Please make an Appointment to return for your 1 year visit, or sooner if needed

## 2020-10-09 NOTE — Progress Notes (Signed)
Subjective:    Patient ID: [name], female    DOB: 1941/03/21, 79 y.o.   MRN: [id number]  HPI  Here for wellness and f/u;  Overall doing ok;  Pt denies Chest pain, worsening SOB, DOE, wheezing, orthopnea, PND, worsening LE edema, palpitations, dizziness or syncope.  Pt denies neurological change such as new headache, facial or extremity weakness.  Pt denies polydipsia, polyuria, or low sugar symptoms. Pt states overall good compliance with treatment and medications, good tolerability, and has been trying to follow appropriate diet.  Pt denies worsening depressive symptoms, suicidal ideation or panic. No fever, night sweats, wt loss, loss of appetite, or other constitutional symptoms.  Pt states good ability with ADL's, has low fall risk, home safety reviewed and adequate, no other significant changes in hearing or vision, and only occasionally active with exercise. Pt continues to have recurring LBP without change in severity, bowel or bladder change, fever, wt loss,  worsening LE pain/numbness/weakness, gait change or falls.  Has a cane to use but did not bring today.  Asks for repeat PT in [place]  Had labs with renal last wk - declines further today Past Medical History:  Diagnosis Date  . ABDOMINAL INCISIONAL HERNIA 08/13/2009   s/p repair  . Allergic rhinitis, cause unspecified 09/28/2011  . BRADYCARDIA 09/26/2010  . CKD (chronic kidney disease), stage III ([place]) 02/26/2015   Solitary kidney  . COLONIC POLYPS, HX OF 08/25/2007  . Complication of anesthesia   . DIVERTICULOSIS, COLON 08/25/2007  . Dysrhythmia   . GASTROESOPHAGEAL REFLUX DISEASE 08/04/2007  . HYPERLIPIDEMIA 09/27/2010   not on Rx therapy  . HYPERTENSION 08/06/2007  . Hypothyroidism   . INTERNAL HEMORRHOIDS 08/04/2007  . LOW BACK PAIN 08/06/2007   scoliosis; DJD; gets injections  . OSTEOPENIA 08/04/2007  . PAF (paroxysmal atrial fibrillation) ([place])    a.  Echo 2/17:  Vigorous LVF, EF 65-70%, no RWMA, Ao  sclerosis, trivial AI, trivial MR, trivial TR, PASP 23 mmHg  . PONV (postoperative nausea and vomiting)   . Solitary kidney, acquired 09/28/2011   donated R kidney to husband   Past Surgical History:  Procedure Laterality Date  . LAMINECTOMY  1999   L4-5/lumbar  . NEPHRECTOMY     Right  . s/o incisional hernia repair     RUQ  . STRABISMUS SURGERY Bilateral 04/14/2019   Procedure: STRABISMUS REPAIR BILATERAL;  Surgeon: Everitt Amber, MD;  Location: [facility];  Service: Ophthalmology;  Laterality: Bilateral;  . TONSILLECTOMY      reports that she has never smoked. She has never used smokeless tobacco. She reports that she does not drink alcohol and does not use drugs. family history includes Alzheimer's disease in her brother; Coronary artery disease (age of onset: 39) in her mother; Diabetes in her mother and another family member; Heart attack (age of onset: 45) in her father. Allergies  Allergen Reactions  . Hydrocodone Nausea Only and Other (See Comments)    lethargy  . Ipratropium     Causes nasal bleeding   Current Outpatient Medications on File Prior to Visit  Medication Sig Dispense Refill  . Ascorbic Acid (VITAMIN C) 1000 MG tablet Take 1,000 mg by mouth daily.    . Calcium Citrate (CITRACAL PO) Take 2 tablets by mouth daily.     [name] LUMIGAN 0.01 % SOLN Place 1 drop into both eyes at bedtime.     . Multiple Vitamins-Minerals (WOMENS 50+ MULTI VITAMIN/MIN PO) Take 1 tablet by  mouth daily.    [name] VITAMIN K PO Take 100 mcg by mouth daily.    Alveda Reasons 15 MG TABS tablet TAKE 1 TABLET BY MOUTH ONCE DAILY WITH SUPPER 90 tablet 1   No current facility-administered medications on file prior to visit.   Review of Systems All otherwise neg per pt    Objective:   Physical Exam BP 120/76 (BP Location: Left Arm, Patient Position: Sitting, Cuff Size: Large)   Pulse 81   Temp 98.1 F (36.7 C) (Oral)   Ht 5\' 6"  (1.676 m)   Wt 150 lb (68 kg)   SpO2 98%   BMI 24.21  kg/m  VS noted,  Constitutional: Pt appears in NAD HENT: Head: NCAT.  Right Ear: External ear normal.  Left Ear: External ear normal.  Eyes: . Pupils are equal, round, and reactive to light. Conjunctivae and EOM are normal Nose: without d/c or deformity Neck: Neck supple. Gross normal ROM Cardiovascular: Normal rate and regular rhythm.   Pulmonary/Chest: Effort normal and breath sounds without rales or wheezing.  Abd:  Soft, NT, ND, + BS, no organomegaly Neurological: Pt is alert. At baseline orientation, motor grossly intact Skin: Skin is warm. No rashes, other new lesions, no LE edema Psychiatric: Pt behavior is normal without agitation  All otherwise neg per pt Lab Results  Component Value Date   WBC 7.8 09/13/2018   HGB 13.9 09/13/2018   HCT 40.9 09/13/2018   PLT 222.0 09/13/2018   GLUCOSE 93 09/13/2018   CHOL 230 (H) 09/19/2019   TRIG 189.0 (H) 09/19/2019   HDL 68.30 09/19/2019   LDLDIRECT 117.7 09/26/2013   LDLCALC 124 (H) 09/19/2019   ALT 13 09/13/2018   AST 14 09/13/2018   NA 140 09/13/2018   K 4.6 09/13/2018   CL 103 09/13/2018   CREATININE 1.52 (H) 09/13/2018   BUN 23 09/13/2018   CO2 29 09/13/2018   TSH 2.28 09/19/2019   INR 1.03 10/22/2009   HGBA1C 5.5 09/19/2019      Assessment & Plan:

## 2020-10-09 NOTE — Assessment & Plan Note (Addendum)
Due to ongoing lumbar disc dz, sees Dr Brien Few, asks for outpt PT  I spent 31 minutes in addition to time for CPX wellness examination in preparing to see the patient by review of recent labs, imaging and procedures, obtaining and reviewing separately obtained history, communicating with the patient and family or caregiver, ordering medications, tests or procedures, and documenting clinical information in the EHR including the differential Dx, treatment, and any further evaluation and other management of back pain, ckd, htn, hld, hypothyroid

## 2020-10-13 ENCOUNTER — Encounter: Payer: Self-pay | Admitting: Internal Medicine

## 2020-10-13 NOTE — Assessment & Plan Note (Signed)
stable overall by history and exam, recent data reviewed with pt, and pt to continue medical treatment as before,  to f/u any worsening symptoms or concerns  

## 2020-10-13 NOTE — Assessment & Plan Note (Signed)
stable overall by history and exam, recent data reviewed with pt, and pt to continue medical treatment as before,  to f/u any worsening symptoms or concerns Lab Results  Component Value Date   LDLCALC 124 (H) 09/19/2019  declines f/u lab

## 2020-10-13 NOTE — Assessment & Plan Note (Signed)

## 2020-10-13 NOTE — Assessment & Plan Note (Signed)
Lab Results  Component Value Date   TSH 2.28 09/19/2019  stable overall by history and exam, recent data reviewed with pt, and pt to continue medical treatment as before,  to f/u any worsening symptoms or concerns, declines lab today

## 2020-10-21 ENCOUNTER — Ambulatory Visit: Payer: Medicare HMO | Admitting: Cardiovascular Disease

## 2020-11-13 ENCOUNTER — Other Ambulatory Visit: Payer: Self-pay | Admitting: Internal Medicine

## 2020-11-13 NOTE — Telephone Encounter (Signed)
Please refill as per office routine med refill policy (all routine meds refilled for 3 mo or monthly per pt preference up to one year from last visit, then month to month grace period for 3 mo, then further med refills will have to be denied)  

## 2020-11-27 ENCOUNTER — Telehealth: Payer: Self-pay | Admitting: *Deleted

## 2020-11-27 NOTE — Telephone Encounter (Signed)
   [facility] Pre-operative Risk Assessment    HEARTCARE STAFF: - Please ensure there is not already an duplicate clearance open for this procedure. - Under Visit Info/Reason for Call, type in Other and utilize the format Clearance MM/DD/YY or Clearance TBD. Do not use dashes or single digits. - If request is for dental extraction, please clarify the # of teeth to be extracted.  Request for surgical clearance:  1. What type of surgery is being performed? SPINAL INJECTION   2. When is this surgery scheduled? TBD   3. What type of clearance is required (medical clearance vs. Pharmacy clearance to hold med vs. Both)? BOTH  4. Are there any medications that need to be held prior to surgery and how long? XARELTO x 3-5 DAYS PRIOR TO PROCEDURE PER CLEARANCE REQUEST   5. Practice name and name of physician performing surgery? [facility]; DR. Gwenlyn Perking BARTKO   6. What is the office phone number? [phone]   7.   What is the office fax number? [phone]  8.   Anesthesia type (None, local, MAC, general) ? NONE LISTED   Julaine Hua 11/27/2020, 10:09 AM  _________________________________________________________________   (provider comments below)

## 2020-11-28 NOTE — Telephone Encounter (Signed)
   Primary Cardiologist: [name], MD  Chart reviewed as part of pre-operative protocol coverage. Because of Carolyn Wells's past medical history and time since last visit, she will require a follow-up visit in order to better assess preoperative cardiovascular risk.  She has an appt 12/03/20 with Dr. Acie Fredrickson. I have updated appointment notes. Medical clearance will be addressed at that time.  Per pharmacy review and office protocols, she may hold Xarelto 3 days prior to the planned procedure. Patient will not need bridging with Lovenox (enoxaparin).   Will route to requesting party so they are aware.   Will remove from preop inbox.    Loel Dubonnet, NP  11/28/2020, 3:48 PM

## 2020-11-28 NOTE — Telephone Encounter (Signed)
Patient with diagnosis of atrial fibrillation on Xarelto for anticoagulation.    Procedure: spinal injection Date of procedure: TBD   CHA2DS2-VASc Score = 4  This indicates a 4.8% annual risk of stroke. The patient's score is based upon: CHF History: No HTN History: Yes Diabetes History: No Stroke History: No Vascular Disease History: No Age Score: 2 Gender Score: 1  CrCl 34.4 Platelet count 234  Per office protocol, patient can hold Xarelto for 3 days prior to procedure.    Patient will not need bridging with Lovenox (enoxaparin) around procedure.

## 2020-12-02 ENCOUNTER — Encounter: Payer: Self-pay | Admitting: Cardiovascular Disease

## 2020-12-02 NOTE — Progress Notes (Signed)
Cardiology Office Note:    Date:  12/02/2020   ID:  Carolyn Wells, DOB [birth year], MRN [id number]  PCP:  Biagio Borg, MD  Cardiologist:    Liam Rogers   Electrophysiologist:  n/a  Referring MD:  Dr. Scarlette Shorts  Problem List  1. Paroxysmal atrial fib  2. Hyperlipidemia 3. Essential hypertension 4. CKD - donated a kidney to her husband   Chief Complaint  Patient presents with  . Hypertension       . Atrial Fibrillation       Carolyn Wells is a 80 y.o. female with a hx of HTN, HL, atrial fib  CKD stage 3 (solitary kidney).   CHADS2-VASc=4 (female, age 81, HTN).  This patients CHA2DS2-VASc Score and unadjusted Ischemic Stroke Rate (% per year) is equal to 4.8 % stroke rate/year from a score of 4 Above score calculated as 1 point each if present [CHF, HTN, DM, Vascular=MI/PAD/Aortic Plaque, Age if 65-74, or Female] Above score calculated as 2 points each if present [Age > 75, or Stroke/TIA/TE]  Mar 24, 2016:  Feels very well. No symptoms.     still active.   Exercises regularly  Checks BP regularly ,  Her reading are all very good.  Is on metoprolol for rate control and mild HTN.    Nov. 20, 2017:  Carolyn Wells is doing great She remains asymptomatic. She is still in atrial ablation but cannot tell that her heart beat is irregular.  April 14, 2017:  Carolyn Wells is doing well Held her Xarelto for 7 days prior to back injection Staying active.   Rides her stationary bike   December 21, 2017: Carolyn Wells is seen today for follow-up of her atrial fibrillation.   She also has a history of hypertension and hyperlipidemia. Is on chronic anticoagulation Has a runny nose for the past 2 years .  Has had an extensive work up - no answers yet   July 04, 2018: Feels well BP is a bit elevated. Today  Has CKD Still eats lots of salt   October 27, 2019:  Carolyn Wells is seen today for follow-up of her hypertension and persistent atrial fibrillation.   Seen with daughter,  Benjamine Mola. She has mild chronic kidney disease. Cannot tell that her HR is irreg.  Able to get out and exercise normally .   Has some back aches.  Rides her bike - no CP or dyspnea   Jan. 25, 2022: Carolyn Wells is seen today for follow up of her HTN, Atrial fib She is on chronic anticoagulaion She fell on her face while carrying xarelto . Lost her balance carrying too many groceries.  She needs to have a spinal injection She is at low risk for the procedure.   She may hold her Xarelto for 3 days prior to injection.  Ive routed the pre-op clearance .    Past Medical History:  Diagnosis Date  . ABDOMINAL INCISIONAL HERNIA 08/13/2009   s/p repair  . Allergic rhinitis, cause unspecified 09/28/2011  . BRADYCARDIA 09/26/2010  . CKD (chronic kidney disease), stage III ([place]) 02/26/2015   Solitary kidney  . COLONIC POLYPS, HX OF 08/25/2007  . Complication of anesthesia   . DIVERTICULOSIS, COLON 08/25/2007  . Dysrhythmia   . GASTROESOPHAGEAL REFLUX DISEASE 08/04/2007  . HYPERLIPIDEMIA 09/27/2010   not on Rx therapy  . HYPERTENSION 08/06/2007  . Hypothyroidism   . INTERNAL HEMORRHOIDS 08/04/2007  . LOW BACK PAIN 08/06/2007   scoliosis; DJD; gets injections  . OSTEOPENIA  08/04/2007  . PAF (paroxysmal atrial fibrillation) ([place])    a.  Echo 2/17:  Vigorous LVF, EF 65-70%, no RWMA, Ao sclerosis, trivial AI, trivial MR, trivial TR, PASP 23 mmHg  . PONV (postoperative nausea and vomiting)   . Solitary kidney, acquired 09/28/2011   donated R kidney to husband    Past Surgical History:  Procedure Laterality Date  . LAMINECTOMY  1999   L4-5/lumbar  . NEPHRECTOMY     Right  . s/o incisional hernia repair     RUQ  . STRABISMUS SURGERY Bilateral 04/14/2019   Procedure: STRABISMUS REPAIR BILATERAL;  Surgeon: Everitt Amber, MD;  Location: [facility];  Service: Ophthalmology;  Laterality: Bilateral;  . TONSILLECTOMY      Current Medications: Outpatient Medications Prior to Visit   Medication Sig Dispense Refill  . amLODipine (NORVASC) 2.5 MG tablet Take 1 tablet (2.5 mg total) by mouth daily. 90 tablet 3  . Ascorbic Acid (VITAMIN C) 1000 MG tablet Take 1,000 mg by mouth daily.    . Calcium Citrate (CITRACAL PO) Take 2 tablets by mouth daily.     . EUTHYROX 50 MCG tablet TAKE 1 TABLET BY MOUTH ONCE DAILY BEFORE BREAKFAST 30 tablet 0  . LUMIGAN 0.01 % SOLN Place 1 drop into both eyes at bedtime.     . metoprolol succinate (TOPROL-XL) 50 MG 24 hr tablet Take 1 tablet (50 mg total) by mouth daily. 90 tablet 3  . Multiple Vitamins-Minerals (WOMENS 50+ MULTI VITAMIN/MIN PO) Take 1 tablet by mouth daily.    . pantoprazole (PROTONIX) 40 MG tablet Take 1 tablet (40 mg total) by mouth daily. 90 tablet 3  . VITAMIN K PO Take 100 mcg by mouth daily.    Alveda Reasons 15 MG TABS tablet TAKE 1 TABLET BY MOUTH ONCE DAILY WITH SUPPER 90 tablet 1  . XIIDRA 5 % SOLN Place 1 drop into both eyes 2 (two) times daily.     No facility-administered medications prior to visit.     Allergies:   Hydrocodone and Ipratropium   Social History   Socioeconomic History  . Marital status: Married    Spouse name: Not on file  . Number of children: 2  . Years of education: Not on file  . Highest education level: Not on file  Occupational History  . 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    Employer: RETIRED  Tobacco Use  . Smoking status: Never Smoker  . Smokeless tobacco: Never Used  Substance and Sexual Activity  . Alcohol use: No    Alcohol/week: 0.0 standard drinks  . Drug use: No  . Sexual activity: Not on file  Other Topics Concern  . Not on file  Social History Narrative   Husband disabled, renal failure, DM, HTN.   Originally from [place], [place] >> came to [place] 1966 (husband in [employer])   Social Determinants of Radio broadcast assistant Strain: Not on file  Food Insecurity: Not on file  Transportation Needs: Not on file  Physical Activity: Not on file  Stress: Not on  file  Social Connections: Not on file     Family History:  The patient's family history includes Alzheimer's disease in her brother; Coronary artery disease (age of onset: 61) in her mother; Diabetes in her mother and another family member; Heart attack (age of onset: 64) in her father.   ROS:   As noted in current hx.    Physical Exam: There were no vitals taken for  this visit.  GEN:  Well nourished, well developed in no acute distress HEENT: Normal NECK: No JVD; No carotid bruits LYMPHATICS: No lymphadenopathy CARDIAC: irreg. Irreg. no murmurs, rubs, gallops RESPIRATORY:  Clear to auscultation without rales, wheezing or rhonchi  ABDOMEN: Soft, non-tender, non-distended MUSCULOSKELETAL:  No edema; No deformity  SKIN: Warm and dry NEUROLOGIC:  Alert and oriented x 3   ECG :     Wt Readings from Last 3 Encounters:  10/09/20 150 lb (68 kg)  04/29/20 151 lb 1.9 oz (68.5 kg)  10/27/19 151 lb 3.2 oz (68.6 kg)      Studies/Labs Reviewed:     EKG:    Recent Labs: No results found for requested labs within last 8760 hours.   Recent Lipid Panel    Component Value Date/Time   CHOL 230 (H) 09/19/2019 1418   TRIG 189.0 (H) 09/19/2019 1418   HDL 68.30 09/19/2019 1418   CHOLHDL 3 09/19/2019 1418   VLDL 37.8 09/19/2019 1418   LDLCALC 124 (H) 09/19/2019 1418   LDLDIRECT 117.7 09/26/2013 1420     Assessment / PLAN:      1.  Chronic atrial fibrillation:   She has stable atrial fibrillation.  She is on Xarelto.  We will draw a basic metabolic profile and CBC today to make sure that her labs are stable.  2. HTN -blood pressures well controlled.  3. CKD -    5. Hyperlipidemia:     [name], MD  12/02/2020 8:50 PM    [facility] [address], [place], [place]  [address] Phone: [phone]; Fax: [phone]

## 2020-12-03 ENCOUNTER — Ambulatory Visit (INDEPENDENT_AMBULATORY_CARE_PROVIDER_SITE_OTHER): Payer: Medicare HMO | Admitting: Cardiovascular Disease

## 2020-12-03 ENCOUNTER — Other Ambulatory Visit: Payer: Self-pay

## 2020-12-03 ENCOUNTER — Encounter: Payer: Self-pay | Admitting: Cardiovascular Disease

## 2020-12-03 ENCOUNTER — Telehealth: Payer: Self-pay | Admitting: Cardiovascular Disease

## 2020-12-03 VITALS — BP 114/82 | HR 83 | Ht 66.0 in | Wt 149.6 lb

## 2020-12-03 DIAGNOSIS — I482 Chronic atrial fibrillation, unspecified: Secondary | ICD-10-CM | POA: Diagnosis not present

## 2020-12-03 LAB — CBC WITH DIFFERENTIAL/PLATELET
Basophils Absolute: 0 10*3/uL (ref 0.0–0.2)
Basos: 1 %
EOS (ABSOLUTE): 0.1 10*3/uL (ref 0.0–0.4)
Eos: 1 %
Hematocrit: 37.5 % (ref 34.0–46.6)
Hemoglobin: 12.8 g/dL (ref 11.1–15.9)
Immature Grans (Abs): 0 10*3/uL (ref 0.0–0.1)
Immature Granulocytes: 0 %
Lymphocytes Absolute: 1.6 10*3/uL (ref 0.7–3.1)
Lymphs: 19 %
MCH: 32.9 pg (ref 26.6–33.0)
MCHC: 34.1 g/dL (ref 31.5–35.7)
MCV: 96 fL (ref 79–97)
Monocytes Absolute: 0.8 10*3/uL (ref 0.1–0.9)
Monocytes: 9 %
Neutrophils Absolute: 6 10*3/uL (ref 1.4–7.0)
Neutrophils: 70 %
Platelets: 183 10*3/uL (ref 150–450)
RBC: 3.89 x10E6/uL (ref 3.77–5.28)
RDW: 12.2 % (ref 11.7–15.4)
WBC: 8.5 10*3/uL (ref 3.4–10.8)

## 2020-12-03 LAB — BASIC METABOLIC PANEL
BUN/Creatinine Ratio: 13 (ref 12–28)
BUN: 21 mg/dL (ref 8–27)
CO2: 23 mmol/L (ref 20–29)
Calcium: 9.9 mg/dL (ref 8.7–10.3)
Chloride: 99 mmol/L (ref 96–106)
Creatinine, Ser: 1.63 mg/dL — ABNORMAL HIGH (ref 0.57–1.00)
GFR calc Af Amer: 34 mL/min/{1.73_m2} — ABNORMAL LOW (ref >59)
GFR calc non Af Amer: 30 mL/min/{1.73_m2} — ABNORMAL LOW (ref >59)
Glucose: 93 mg/dL (ref 65–99)
Potassium: 4.6 mmol/L (ref 3.5–5.2)
Sodium: 138 mmol/L (ref 134–144)

## 2020-12-03 NOTE — Telephone Encounter (Signed)
Patient is calling to speak with Dr. Acie Fredrickson. Please call back   [place] [phone] Ext [phone])

## 2020-12-03 NOTE — Telephone Encounter (Signed)
Molly from [facility] calling to request the clearance be sent again, because they have not received it.

## 2020-12-03 NOTE — Telephone Encounter (Signed)
   Primary Cardiologist: [name], MD  Chart reviewed as part of pre-operative protocol coverage. Given past medical history and time since last visit, based on ACC/AHA guidelines, MOE BRIER would be at acceptable risk for the planned procedure without further cardiovascular testing.   Her Xarelto may be held for 3 days prior to injection.  Please resume her Xarelto the following day as long as she remains stable.    I will route this recommendation to the requesting party via Epic fax function and remove from pre-op pool.  Please call with questions.  Jossie Ng. Demetruis Depaul NP-C    12/03/2020, 1:35 PM [facility] [address] Office [phone] Fax [phone]

## 2020-12-03 NOTE — Patient Instructions (Signed)
Medication Instructions:  Your provider recommends that you continue on your current medications as directed. Please refer to the Current Medication list given to you today.   *If you need a refill on your cardiac medications before your next appointment, please call your pharmacy*  Lab Work: TODAY! BMET, CBC If you have labs (blood work) drawn today and your tests are completely normal, you will receive your results only by: [name] MyChart Message (if you have MyChart) OR . A paper copy in the mail If you have any lab test that is abnormal or we need to change your treatment, we will call you to review the results.  Follow-Up: At [facility], you and your health needs are our priority.  As part of our continuing mission to provide you with exceptional heart care, we have created designated Provider Care Teams.  These Care Teams include your primary Cardiologist (physician) and Advanced Practice Providers (APPs -  Physician Assistants and Nurse Practitioners) who all work together to provide you with the care you need, when you need it. Your next appointment:   12 month(s) The format for your next appointment:   In Person Provider:   You may see [name], MD or one of the following Advanced Practice Providers on your designated Care Team:    Richardson Dopp, PA-C  [place], [place]

## 2020-12-03 NOTE — Telephone Encounter (Signed)
Per Coletta Memos, FNP pre op provider I will forward this note tom pre op pool.

## 2020-12-03 NOTE — Telephone Encounter (Signed)
I returned call to [place] [phone] Ext [phone]) though office was already closed. I did forward note to Dr. Acie Fredrickson. I believe call may be in regards to clearance. Pt saw Dr. Acie Fredrickson today. Though looks like clearance was fax 11/27/20 to requesting office. I will fax again to requesting office fax # [phone] which is on the clearance form. I did send to MD as well due to there may be another question that may needs to answered by MD.

## 2020-12-03 NOTE — Telephone Encounter (Signed)
Carolyn Wells is at low risk for her spinal injection. She may hold her Xarelto for 3 days prior to the injection.  She should restart the Xarelto the day following injection if everything is stable.

## 2020-12-16 ENCOUNTER — Telehealth: Payer: Self-pay | Admitting: Internal Medicine

## 2020-12-16 MED ORDER — LEVOTHYROXINE SODIUM 50 MCG PO TABS: ORAL_TABLET | ORAL | 3 refills | Status: DC

## 2020-12-16 NOTE — Telephone Encounter (Signed)
Has been corrected.

## 2020-12-16 NOTE — Telephone Encounter (Signed)
levothyroxine (EUTHYROX) 50 MCG  Patient requesting a refill for this medication, but at her last visit she had requested it be a 90 day supply but only a 30 day supply was sent in.  [place], [place] Phone:  [phone]  Fax:  [phone]     Last seen- 12.01.21 Next apt- 12.05.22

## 2020-12-19 NOTE — Telephone Encounter (Signed)
I will re-fax to requesting office.  

## 2020-12-19 NOTE — Telephone Encounter (Signed)
   Primary Cardiologist: [name], MD  I am covering pre-operative pool today. This message was listed in "Addendum notification" but not clear to me it was acted upon. Will forward to callback team to ensure it was sent as requested.   Charlie Pitter, PA-C 12/19/2020, 8:36 AM

## 2021-01-23 ENCOUNTER — Telehealth: Payer: Self-pay | Admitting: *Deleted

## 2021-01-23 DIAGNOSIS — Z79899 Other long term (current) drug therapy: Secondary | ICD-10-CM | POA: Insufficient documentation

## 2021-01-23 DIAGNOSIS — F411 Generalized anxiety disorder: Secondary | ICD-10-CM | POA: Insufficient documentation

## 2021-01-23 NOTE — Telephone Encounter (Signed)
   [facility] Pre-operative Risk Assessment    HEARTCARE STAFF: - Please ensure there is not already an duplicate clearance open for this procedure. - Under Visit Info/Reason for Call, type in Other and utilize the format Clearance MM/DD/YY or Clearance TBD. Do not use dashes or single digits. - If request is for dental extraction, please clarify the # of teeth to be extracted.  Request for surgical clearance:  1. What type of surgery is being performed? MEDIAL BRANCH BLOCK   2. When is this surgery scheduled? TBD   3. What type of clearance is required (medical clearance vs. Pharmacy clearance to hold med vs. Both)? BOTH  4. Are there any medications that need to be held prior to surgery and how long? XARELTO x 7 DAYS PRIOR   5. Practice name and name of physician performing surgery? [facility]; DR. Gwenlyn Perking BARTKO   6. What is the office phone number? [phone]   7.   What is the office fax number? [phone]  8.   Anesthesia type (None, local, MAC, general) ? NOT LISTED   Julaine Hua 01/23/2021, 11:18 AM  _________________________________________________________________   (provider comments below)

## 2021-01-24 NOTE — Telephone Encounter (Signed)
   Primary Cardiologist: [name], MD  Chart reviewed as part of pre-operative protocol coverage. Patient was contacted 01/24/2021 in reference to pre-operative risk assessment for pending surgery as outlined below.  KYRSTYN GREEAR was last seen on 12/03/20 by Dr. Acie Fredrickson.  Since that day, NIRVANA BLANCHETT has done well from a cardiac standpoint. She rides her stationary bike for 3 miles per day. She can easily complete 4 METs without anginal complaints  Therefore, based on ACC/AHA guidelines, the patient would be at acceptable risk for the planned procedure without further cardiovascular testing.   The patient was advised that if she develops new symptoms prior to surgery to contact our office to arrange for a follow-up visit, and she verbalized understanding.  Per pharmacy recommendations, patient can hold xarelto 3 days prior to her upcoming injection with plans to restart as soon as she is cleared to do so by her surgeon. Please note, this recommendations is different than the request 1 week hold to minimize her stroke risk.   I will route this recommendation to the requesting party via Epic fax function and remove from pre-op pool. Please call with questions.  Abigail Butts, PA-C 01/24/2021, 9:02 AM

## 2021-01-24 NOTE — Telephone Encounter (Signed)
Patient with diagnosis of A Fib on Xarelto for anticoagulation.    1. Procedure: MEDIAL BRANCH BLOCK   Date of procedure: TBD   CHA2DS2-VASc Score = 4  This indicates a 4.8% annual risk of stroke. The patient's score is based upon: CHF History: No HTN History: Yes Diabetes History: No Stroke History: No Vascular Disease History: No Age Score: 2 Gender Score: 1   CrCl 29 mL/min Platelet count 183K  Per office protocol, patient can hold Xarelto for 3 days prior to procedure.  If MD's office requires a 7 day hold or longer than 3 days, would need authorization from Dr. Acie Fredrickson.  If not bridging, patient should restart Xarelto on the evening of procedure or day after, at discretion of procedure MD

## 2021-03-05 ENCOUNTER — Telehealth: Payer: Self-pay | Admitting: *Deleted

## 2021-03-05 NOTE — Telephone Encounter (Signed)
Patient with diagnosis of afib on Xarelto for anticoagulation.    Procedure: spinal injection Date of procedure: TBD  CHA2DS2-VASc Score = 4  This indicates a 4.8% annual risk of stroke. The patient's score is based upon: CHF History: No HTN History: Yes Diabetes History: No Stroke History: No Vascular Disease History: No Age Score: 2 Gender Score: 1   CrCl 36mL/min (pt on appropriately reduced dose of Xarelto) Platelet count 183K  Per office protocol, patient can hold Xarelto for 3 days prior to procedure.

## 2021-03-05 NOTE — Telephone Encounter (Signed)
Clinical pharmacist to review Xarelto 

## 2021-03-05 NOTE — Telephone Encounter (Signed)
   [facility] Pre-operative Risk Assessment    Patient Name: MIO SCHELLINGER  DOB: 04-15-1941  MRN: [id number]   HEARTCARE STAFF: - Please ensure there is not already an duplicate clearance open for this procedure. - Under Visit Info/Reason for Call, type in Other and utilize the format Clearance MM/DD/YY or Clearance TBD. Do not use dashes or single digits. - If request is for dental extraction, please clarify the # of teeth to be extracted.  Request for surgical clearance:  1. What type of surgery is being performed? Spinal injection   2. When is this surgery scheduled? TBD   3. What type of clearance is required (medical clearance vs. Pharmacy clearance to hold med vs. Both)? Both  4. Are there any medications that need to be held prior to surgery and how long?Xarelto for 3-5 days   5. Practice name and name of physician performing surgery? [facility], Dr Marlaine Hind   6. What is the office phone number? [phone]   7.   What is the office fax number? [phone]  8.   Anesthesia type (None, local, MAC, general) ? None listed   Juventino Slovak 03/05/2021, 11:01 AM  _________________________________________________________________   (provider comments below)

## 2021-03-05 NOTE — Telephone Encounter (Signed)
    LOREDANA MEDELLIN DOB:  15-Feb-1941  MRN:  [id number]   Primary Cardiologist: [name], MD  Chart reviewed as part of pre-operative protocol coverage. Given past medical history and time since last visit, based on ACC/AHA guidelines, TIMEA BREED would be at acceptable risk for the planned procedure without further cardiovascular testing.   The patient was advised that if she develops new symptoms prior to surgery to contact our office to arrange for a follow-up visit, and she verbalized understanding.  Patient may hold Xarelto for 3 days prior to the procedure and restart as soon as possible afterward at the surgeon's discretion.  I will route this recommendation to the requesting party via Epic fax function and remove from pre-op pool.  Please call with questions.  [place], [place] 03/05/2021, 6:18 PM

## 2021-03-11 ENCOUNTER — Telehealth: Payer: Self-pay | Admitting: Internal Medicine

## 2021-03-11 NOTE — Telephone Encounter (Signed)
LVM for pt to rtn my call to schedule AWV with [facility]. Please schedule AWV if pt calls the office  

## 2021-03-14 LAB — HM MAMMOGRAPHY

## 2021-03-20 ENCOUNTER — Encounter: Payer: Self-pay | Admitting: Internal Medicine

## 2021-03-25 ENCOUNTER — Telehealth: Payer: Self-pay | Admitting: *Deleted

## 2021-03-25 ENCOUNTER — Other Ambulatory Visit: Payer: Self-pay | Admitting: Cardiovascular Disease

## 2021-03-25 NOTE — Telephone Encounter (Signed)
Will route to PharmD for rec's re: holding anticoagulation. Richardson Dopp, PA-C    03/25/2021 10:01 AM

## 2021-03-25 NOTE — Telephone Encounter (Signed)
Patient with diagnosis of atrial fibrillation on Xarelto for anticoagulation.    Procedure: spinal injection Date of procedure: TBD   CHA2DS2-VASc Score = 4  This indicates a 4.8% annual risk of stroke. The patient's score is based upon: CHF History: No HTN History: Yes Diabetes History: No Stroke History: No Vascular Disease History: No Age Score: 2 Gender Score: 1  CrCl 29.5 Platelet count 183  Per office protocol, patient can hold Xarelto for 3 days prior to procedure.   Patient will not need bridging with Lovenox (enoxaparin) around procedure.

## 2021-03-25 NOTE — Telephone Encounter (Signed)
Xarelto 15mg  refill request received. Pt is 80 years old, weight-67.9kg, Crea-1.63 on 12/03/2020, last seen by Dr. Acie Fredrickson on 12/03/2020, Diagnosis-Afib, CrCl-29.26ml/min; Dose is appropriate based on dosing criteria. Will send in refill to requested pharmacy.

## 2021-03-25 NOTE — Telephone Encounter (Signed)
   [facility] Pre-operative Risk Assessment    Patient Name: Carolyn Wells  DOB: May 02, 1941  MRN: [id number]   HEARTCARE STAFF: - Please ensure there is not already an duplicate clearance open for this procedure. - Under Visit Info/Reason for Call, type in Other and utilize the format Clearance MM/DD/YY or Clearance TBD. Do not use dashes or single digits. - If request is for dental extraction, please clarify the # of teeth to be extracted.  Request for surgical clearance:  1. What type of surgery is being performed? SPINAL INJECTION   2. When is this surgery scheduled? TBD   3. What type of clearance is required (medical clearance vs. Pharmacy clearance to hold med vs. Both)? BOTH  4. Are there any medications that need to be held prior to surgery and how long? XARELTO x 3-5 DAYS PRIOR TO INJECTION   5. Practice name and name of physician performing surgery? [facility]; DR. Gwenlyn Perking BARTKO   6. What is the office phone number? [phone]   7.   What is the office fax number? [phone]  8.   Anesthesia type (None, local, MAC, general) ? NOT LISTED   Julaine Hua 03/25/2021, 8:33 AM  _________________________________________________________________   (provider comments below)

## 2021-03-27 NOTE — Telephone Encounter (Signed)
   Primary Cardiologist: [name], MD  Chart reviewed as part of pre-operative protocol coverage. Given past medical history and time since last visit, based on ACC/AHA guidelines, Carolyn Wells would be at acceptable risk for the planned procedure without further cardiovascular testing.   Patient with diagnosis of atrial fibrillation on Xarelto for anticoagulation.    Procedure: spinal injection Date of procedure: TBD   CHA2DS2-VASc Score = 4  This indicates a 4.8% annual risk of stroke. The patient's score is based upon: CHF History: No HTN History: Yes Diabetes History: No Stroke History: No Vascular Disease History: No Age Score: 2 Gender Score: 1  CrCl 29.5 Platelet count 183  Per office protocol, patient can hold Xarelto for 3 days prior to procedure.   Patient will not need bridging with Lovenox (enoxaparin) around procedure.  I will route this recommendation to the requesting party via Epic fax function and remove from pre-op pool.  Please call with questions.  Jossie Ng. Damion Kant NP-C    03/27/2021, 8:24 AM [address] Office [phone] Fax [phone]

## 2021-07-21 ENCOUNTER — Other Ambulatory Visit: Payer: Self-pay | Admitting: Student

## 2021-07-21 DIAGNOSIS — M48062 Spinal stenosis, lumbar region with neurogenic claudication: Secondary | ICD-10-CM

## 2021-07-25 ENCOUNTER — Other Ambulatory Visit: Payer: Self-pay

## 2021-07-25 ENCOUNTER — Ambulatory Visit
Admission: RE | Admit: 2021-07-25 | Discharge: 2021-07-25 | Disposition: A | Discharge status: Home / Self Care | Payer: Medicare HMO | Source: Ambulatory Visit | Attending: Student | Admitting: Student

## 2021-07-25 DIAGNOSIS — M48062 Spinal stenosis, lumbar region with neurogenic claudication: Secondary | ICD-10-CM

## 2021-07-25 MED ORDER — ONDANSETRON HCL 4 MG/2ML IJ SOLN
4.0000 mg | Freq: Once | INTRAMUSCULAR | Status: DC | PRN
Start: 2021-07-25 — End: 2021-07-26

## 2021-07-25 MED ORDER — MEPERIDINE HCL 50 MG/ML IJ SOLN: 50.0000 mg | Freq: Once | INTRAMUSCULAR | Status: DC | PRN

## 2021-07-25 MED ORDER — IOPAMIDOL (ISOVUE-M 200) INJECTION 41%
15.0000 mL | Freq: Once | INTRAMUSCULAR | Status: AC
  Administered 2021-07-25: 15 mL via INTRATHECAL

## 2021-07-25 MED ORDER — DIAZEPAM 5 MG PO TABS
5.0000 mg | ORAL_TABLET | Freq: Once | ORAL | Status: AC
  Administered 2021-07-25: 5 mg via ORAL

## 2021-07-25 NOTE — Discharge Instructions (Signed)

## 2021-09-07 ENCOUNTER — Other Ambulatory Visit: Payer: Self-pay | Admitting: Cardiovascular Disease

## 2021-09-08 NOTE — Telephone Encounter (Signed)
Prescription refill request for Xarelto received.   Indication: afib  Last office visit: [place], 12/03/2020 Weight:67.9 kg  Age: 79 yo  Scr: 1.63, 12/03/2020 CrCl: 26ml/min   Refill sent.

## 2021-09-11 ENCOUNTER — Telehealth: Payer: Self-pay | Admitting: Internal Medicine

## 2021-09-11 NOTE — Telephone Encounter (Signed)
Approval given to pharmacy

## 2021-09-11 NOTE — Telephone Encounter (Signed)
Pharmacy requesting approval to switch manufactures for rx levothyroxine (EUTHYROX) 50 MCG tablet  Please advise [place], [place]  [phone]

## 2021-10-09 ENCOUNTER — Ambulatory Visit (INDEPENDENT_AMBULATORY_CARE_PROVIDER_SITE_OTHER): Payer: Medicare HMO

## 2021-10-09 ENCOUNTER — Encounter: Payer: Medicare HMO | Admitting: Internal Medicine

## 2021-10-09 ENCOUNTER — Encounter: Payer: Self-pay | Admitting: Podiatry

## 2021-10-09 ENCOUNTER — Other Ambulatory Visit: Payer: Self-pay

## 2021-10-09 ENCOUNTER — Ambulatory Visit (INDEPENDENT_AMBULATORY_CARE_PROVIDER_SITE_OTHER): Payer: Medicare HMO | Admitting: Podiatry

## 2021-10-09 DIAGNOSIS — M778 Other enthesopathies, not elsewhere classified: Secondary | ICD-10-CM | POA: Diagnosis not present

## 2021-10-09 DIAGNOSIS — G5793 Unspecified mononeuropathy of bilateral lower limbs: Secondary | ICD-10-CM | POA: Diagnosis not present

## 2021-10-09 MED ORDER — TRIAMCINOLONE ACETONIDE 40 MG/ML IJ SUSP
20.0000 mg | Freq: Once | INTRAMUSCULAR | Status: AC
  Administered 2021-10-09: 20 mg

## 2021-10-09 MED ORDER — GABAPENTIN 100 MG PO CAPS
100.0000 mg | ORAL_CAPSULE | Freq: Every day | ORAL | 3 refills | Status: DC
Start: 2021-10-09 — End: 2022-04-15

## 2021-10-09 NOTE — Progress Notes (Signed)
Subjective:  Patient ID: Carolyn Wells, female    DOB: July 28, 1941,  MRN: [id number] HPI Chief Complaint  Patient presents with   Foot Pain    Dorsal and plantar forefoot right - aching plantarly x 1-2 months, no injury, now noticed swelling dorsally, also in both feet has a lot of numbness and burning at night-tried medicated lotion-no help   New Patient (Initial Visit)    Est pt 2017    80 y.o. female presents with the above complaint.   ROS: Denies fever chills nausea vomiting muscle aches pains calf pain back pain chest pain shortness of breath.  Past Medical History:  Diagnosis Date   ABDOMINAL INCISIONAL HERNIA 08/13/2009   s/p repair   Allergic rhinitis, cause unspecified 09/28/2011   BRADYCARDIA 09/26/2010   CKD (chronic kidney disease), stage III ([place]) 02/26/2015   Solitary kidney   COLONIC POLYPS, HX OF 01/77/9390   Complication of anesthesia    DIVERTICULOSIS, COLON 08/25/2007   Dysrhythmia    GASTROESOPHAGEAL REFLUX DISEASE 08/04/2007   HYPERLIPIDEMIA 09/27/2010   not on Rx therapy   HYPERTENSION 08/06/2007   Hypothyroidism    INTERNAL HEMORRHOIDS 08/04/2007   LOW BACK PAIN 08/06/2007   scoliosis; DJD; gets injections   OSTEOPENIA 08/04/2007   PAF (paroxysmal atrial fibrillation) ([place])    a.  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   Past Surgical History:  Procedure Laterality Date   LAMINECTOMY  1999   L4-5/lumbar   NEPHRECTOMY     Right   s/o incisional hernia repair     RUQ   STRABISMUS SURGERY Bilateral 04/14/2019   Procedure: STRABISMUS REPAIR BILATERAL;  Surgeon: Everitt Amber, MD;  Location: [facility];  Service: Ophthalmology;  Laterality: Bilateral;   TONSILLECTOMY      Current Outpatient Medications:    gabapentin (NEURONTIN) 100 MG capsule, Take 1  capsule (100 mg total) by mouth at bedtime., Disp: 90 capsule, Rfl: 3   amLODipine (NORVASC) 2.5 MG tablet, Take 1 tablet (2.5 mg total) by mouth daily., Disp: 90 tablet, Rfl: 3   Ascorbic Acid (VITAMIN C) 1000 MG tablet, Take 1,000 mg by mouth daily., Disp: , Rfl:    Calcium Citrate (CITRACAL PO), Take 2 tablets by mouth daily. , Disp: , Rfl:    levothyroxine (EUTHYROX) 50 MCG tablet, TAKE 1 TABLET BY MOUTH ONCE DAILY BEFORE BREAKFAST, Disp: 90 tablet, Rfl: 3   LUMIGAN 0.01 % SOLN, Place 1 drop into both eyes at bedtime. , Disp: , Rfl:    metoprolol succinate (TOPROL-XL) 50 MG 24 hr tablet, Take 1 tablet (50 mg total) by mouth daily., Disp: 90 tablet, Rfl: 3   Multiple Vitamins-Minerals (PRESERVISION AREDS 2 PO), Take 1 tablet by mouth 2 (two) times daily., Disp: , Rfl:    Multiple Vitamins-Minerals (WOMENS 50+ MULTI VITAMIN/MIN PO), Take 1 tablet by mouth daily., Disp: , Rfl:    pantoprazole (PROTONIX) 40 MG tablet, Take 1 tablet (40 mg total) by mouth daily., Disp: 90 tablet, Rfl: 3   Rivaroxaban (XARELTO) 15 MG TABS tablet, TAKE 1 TABLET BY MOUTH ONCE DAILY WITH SUPPER, Disp: 90 tablet, Rfl: 1   traMADol (ULTRAM) 50 MG tablet, Take 50 mg by mouth every 8 (eight) hours as needed., Disp: , Rfl:    VITAMIN K PO, Take  100 mcg by mouth daily., Disp: , Rfl:    XIIDRA 5 % SOLN, Place 1 drop into both eyes 2 (two) times daily., Disp: , Rfl:   Allergies  Allergen Reactions   Hydrocodone Nausea Only and Other (See Comments)    lethargy   Ipratropium     Causes nasal bleeding   Review of Systems Objective:  There were no vitals filed for this visit.  General: Well developed, nourished, in no acute distress, alert and oriented x3   Dermatological: Skin is warm, dry and supple bilateral. Nails x 10 are well maintained; remaining integument appears unremarkable at this time. There are no open sores, no preulcerative lesions, no rash or signs of infection present.  Vascular: Dorsalis Pedis artery  and Posterior Tibial artery pedal pulses are 2/4 bilateral with immedate capillary fill time. Pedal hair growth present. No varicosities and no lower extremity edema present bilateral.   Neruologic: Grossly intact via light touch bilateral. Vibratory intact via tuning fork bilateral. Protective threshold with Semmes Wienstein monofilament intact to all pedal sites bilateral. Patellar and Achilles deep tendon reflexes 2+ bilateral. No Babinski or clonus noted bilateral.   Musculoskeletal: No gross boney pedal deformities bilateral. No pain, crepitus, or limitation noted with foot and ankle range of motion bilateral. Muscular strength 5/5 in all groups tested bilateral.  Pain on palpation and end range of motion of the second metatarsal phalangeal joint of the right foot.  Gait: Unassisted, Nonantalgic.    Radiographs:  Radiographs taken today demonstrate an osseously mature individual mild hammertoe deformity mild bunion deformities no significant osteoarthritic changes in these areas.  Assessment & Plan:   Assessment: Capsulitis second metatarsophalangeal joint of the right foot.  Nocturnal neuropathy burning foot syndrome central medial arch bilateral  Plan: Started her on gabapentin 100 mg nightly and discussed the possible side effects associated with this drug.  She understands this and is amenable to it.  We also discussed appropriate shoe gear and ice therapy for the metatarsal phalangeal joint as well as an injection of 10 mg of Kenalog and local anesthetic around the joint.    I would like to follow-up with her in about 6 weeks or so.      Creola Krotz T. [place], [place]

## 2021-10-13 ENCOUNTER — Encounter: Payer: Medicare HMO | Admitting: Internal Medicine

## 2021-10-15 ENCOUNTER — Other Ambulatory Visit: Payer: Self-pay

## 2021-10-15 ENCOUNTER — Encounter: Payer: Self-pay | Admitting: Internal Medicine

## 2021-10-15 ENCOUNTER — Ambulatory Visit (INDEPENDENT_AMBULATORY_CARE_PROVIDER_SITE_OTHER): Payer: Medicare HMO | Admitting: Internal Medicine

## 2021-10-15 VITALS — BP 124/64 | HR 78 | Resp 18 | Ht 66.0 in | Wt 147.8 lb

## 2021-10-15 DIAGNOSIS — M545 Low back pain, unspecified: Secondary | ICD-10-CM

## 2021-10-15 DIAGNOSIS — G8929 Other chronic pain: Secondary | ICD-10-CM

## 2021-10-15 DIAGNOSIS — F418 Other specified anxiety disorders: Secondary | ICD-10-CM | POA: Diagnosis not present

## 2021-10-15 DIAGNOSIS — E7800 Pure hypercholesterolemia, unspecified: Secondary | ICD-10-CM

## 2021-10-15 DIAGNOSIS — I10 Essential (primary) hypertension: Secondary | ICD-10-CM | POA: Diagnosis not present

## 2021-10-15 DIAGNOSIS — N183 Chronic kidney disease, stage 3 unspecified: Secondary | ICD-10-CM

## 2021-10-15 DIAGNOSIS — Z0001 Encounter for general adult medical examination with abnormal findings: Secondary | ICD-10-CM | POA: Diagnosis not present

## 2021-10-15 MED ORDER — TRAMADOL HCL ER 200 MG PO TB24: 200.0000 mg | ORAL_TABLET | Freq: Every day | ORAL | 5 refills | Status: DC

## 2021-10-15 MED ORDER — DULOXETINE HCL 30 MG PO CPEP: 30.0000 mg | ORAL_CAPSULE | Freq: Every day | ORAL | 3 refills | Status: DC

## 2021-10-15 NOTE — Progress Notes (Signed)
Patient ID: Carolyn Wells, female   DOB: [birth year], 80 y.o.   MRN: [id number]         Chief Complaint:: wellness exam and chronic lbp, htn, hld, ckd       HPI:  Carolyn Wells is a 80 y.o. female here for wellness exam; declines covid booster, o/w up to date                        Also Pt continues to have recurring LBP without change in severity, bowel or bladder change, fever, wt loss,  worsening LE pain/numbness/weakness, gait change or falls.  Needs better overall pain control, just not functioning well anymore.  Had labs with renal last wk, declines further labs  Pt denies chest pain, increased sob or doe, wheezing, orthopnea, PND, increased LE swelling, palpitations, dizziness or syncope.   Pt denies polydipsia, polyuria, or new focal neuro s/s.   Pt denies fever, wt loss, night sweats, loss of appetite, or other constitutional symptoms  no other new complaints Wt Readings from Last 3 Encounters:  10/15/21 147 lb 12.8 oz (67 kg)  12/03/20 149 lb 9.6 oz (67.9 kg)  10/09/20 150 lb (68 kg)   BP Readings from Last 3 Encounters:  10/15/21 124/64  07/25/21 (!) 162/94  12/03/20 114/82   Immunization History  Administered Date(s) Administered   Fluad Quad(high Dose 65+) 10/09/2020, 10/08/2021   Influenza Split 09/28/2011, 09/28/2012   Influenza Whole 09/10/2006, 08/25/2007, 08/30/2008, 08/13/2009, 09/26/2010   Influenza, High Dose Seasonal PF 08/27/2017, 09/16/2018   Influenza, Seasonal, Injecte, Preservative Fre 09/01/2013   Influenza,inj,Quad PF,6+ Mos 08/28/2015   PFIZER(Purple Top)SARS-COV-2 Vaccination 01/11/2020, 02/06/2020, 08/07/2020   Pneumococcal Conjugate-13 09/28/2013   Pneumococcal Polysaccharide-23 08/09/2005, 08/10/2005, 03/27/2011   Td 11/09/1989, 08/13/2009   Tdap 09/19/2019   Zoster Recombinat (Shingrix) 09/20/2019, 11/19/2019   Zoster, Live 07/28/2006   There are no preventive care reminders to display for this patient.     Past Medical History:   Diagnosis Date   ABDOMINAL INCISIONAL HERNIA 08/13/2009   s/p repair   Allergic rhinitis, cause unspecified 09/28/2011   BRADYCARDIA 09/26/2010   CKD (chronic kidney disease), stage III ([place]) 02/26/2015   Solitary kidney   COLONIC POLYPS, HX OF 26/71/2458   Complication of anesthesia    DIVERTICULOSIS, COLON 08/25/2007   Dysrhythmia    GASTROESOPHAGEAL REFLUX DISEASE 08/04/2007   HYPERLIPIDEMIA 09/27/2010   not on Rx therapy   HYPERTENSION 08/06/2007   Hypothyroidism    INTERNAL HEMORRHOIDS 08/04/2007   LOW BACK PAIN 08/06/2007   scoliosis; DJD; gets injections   OSTEOPENIA 08/04/2007   PAF (paroxysmal atrial fibrillation) ([place])    a.  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   Past Surgical History:  Procedure Laterality Date   LAMINECTOMY  1999   L4-5/lumbar   NEPHRECTOMY     Right   s/o incisional hernia repair     RUQ   STRABISMUS SURGERY Bilateral 04/14/2019   Procedure: STRABISMUS REPAIR BILATERAL;  Surgeon: Everitt Amber, MD;  Location: [facility];  Service: Ophthalmology;  Laterality: Bilateral;   TONSILLECTOMY      reports that she has never smoked. She has never used smokeless tobacco. She reports that she does not drink alcohol and does not use drugs.  family history includes Alzheimer's disease in her brother; Coronary artery disease (age of onset: 48) in her mother; Diabetes in her mother and another family member; Heart attack (age of onset: 64) in her father. Allergies  Allergen Reactions   Hydrocodone Nausea Only and Other (See Comments)    lethargy   Ipratropium     Causes nasal bleeding   Current Outpatient Medications on File Prior to Visit  Medication Sig Dispense Refill   amLODipine (NORVASC) 2.5 MG tablet Take 1 tablet (2.5 mg total) by mouth daily. 90 tablet 3    Ascorbic Acid (VITAMIN C) 1000 MG tablet Take 1,000 mg by mouth daily.     Calcium Citrate (CITRACAL PO) Take 2 tablets by mouth daily.      gabapentin (NEURONTIN) 100 MG capsule Take 1 capsule (100 mg total) by mouth at bedtime. 90 capsule 3   levothyroxine (EUTHYROX) 50 MCG tablet TAKE 1 TABLET BY MOUTH ONCE DAILY BEFORE BREAKFAST 90 tablet 3   LUMIGAN 0.01 % SOLN Place 1 drop into both eyes at bedtime.      metoprolol succinate (TOPROL-XL) 50 MG 24 hr tablet Take 1 tablet (50 mg total) by mouth daily. 90 tablet 3   Multiple Vitamins-Minerals (PRESERVISION AREDS 2 PO) Take 1 tablet by mouth 2 (two) times daily.     Multiple Vitamins-Minerals (WOMENS 50+ MULTI VITAMIN/MIN PO) Take 1 tablet by mouth daily.     pantoprazole (PROTONIX) 40 MG tablet Take 1 tablet (40 mg total) by mouth daily. 90 tablet 3   Rivaroxaban (XARELTO) 15 MG TABS tablet TAKE 1 TABLET BY MOUTH ONCE DAILY WITH SUPPER 90 tablet 1   VITAMIN K PO Take 100 mcg by mouth daily.     XIIDRA 5 % SOLN Place 1 drop into both eyes 2 (two) times daily.     No current facility-administered medications on file prior to visit.        ROS:  All others reviewed and negative.  Objective        PE:  BP 124/64   Pulse 78   Resp 18   Ht 5\' 6"  (1.676 m)   Wt 147 lb 12.8 oz (67 kg)   SpO2 99%   BMI 23.86 kg/m                 Constitutional: Pt appears in NAD               HENT: Head: NCAT.                Right Ear: External ear normal.                 Left Ear: External ear normal.                Eyes: . Pupils are equal, round, and reactive to light. Conjunctivae and EOM are normal               Nose: without d/c or deformity               Neck: Neck supple. Gross normal ROM               Cardiovascular: Normal rate and regular rhythm.                 Pulmonary/Chest: Effort normal and breath sounds without rales or wheezing.                Abd:  Soft, NT, ND, + BS,  no organomegaly               Neurological: Pt is alert. At  baseline orientation, motor grossly intact               Skin: Skin is warm. No rashes, no other new lesions, LE edema - none               Psychiatric: Pt behavior is normal without agitation   Micro: none  Cardiac tracings I have personally interpreted today:  none  Pertinent Radiological findings (summarize): none   Lab Results  Component Value Date   WBC 8.5 12/03/2020   HGB 12.8 12/03/2020   HCT 37.5 12/03/2020   PLT 183 12/03/2020   GLUCOSE 93 12/03/2020   CHOL 230 (H) 09/19/2019   TRIG 189.0 (H) 09/19/2019   HDL 68.30 09/19/2019   LDLDIRECT 117.7 09/26/2013   LDLCALC 124 (H) 09/19/2019   ALT 13 09/13/2018   AST 14 09/13/2018   NA 138 12/03/2020   K 4.6 12/03/2020   CL 99 12/03/2020   CREATININE 1.63 (H) 12/03/2020   BUN 21 12/03/2020   CO2 23 12/03/2020   TSH 2.28 09/19/2019   INR 1.03 10/22/2009   HGBA1C 5.5 09/19/2019   Assessment/Plan:  Carolyn Wells is a 80 y.o. White or Caucasian [1] female with  has a past medical history of ABDOMINAL INCISIONAL HERNIA (08/13/2009), Allergic rhinitis, cause unspecified (09/28/2011), BRADYCARDIA (09/26/2010), CKD (chronic kidney disease), stage III ([place]) (02/26/2015), COLONIC POLYPS, HX OF (92/44/6286), Complication of anesthesia, DIVERTICULOSIS, COLON (08/25/2007), Dysrhythmia, GASTROESOPHAGEAL REFLUX DISEASE (08/04/2007), HYPERLIPIDEMIA (09/27/2010), HYPERTENSION (08/06/2007), Hypothyroidism, INTERNAL HEMORRHOIDS (08/04/2007), LOW BACK PAIN (08/06/2007), OSTEOPENIA (08/04/2007), PAF (paroxysmal atrial fibrillation) ([place]), PONV (postoperative nausea and vomiting), and Solitary kidney, acquired (09/28/2011).  Encounter for well adult exam with abnormal findings Age and sex appropriate education and counseling updated with regular exercise and diet Referrals for preventative services - none needed Immunizations addressed -declines covid booster Smoking counseling  - none needed Evidence for depression or other mood disorder - none  significant Most recent labs reviewed. I have personally reviewed and have noted: 1) the patient's medical and social history 2) The patient's current medications and supplements 3) The patient's height, weight, and BMI have been recorded in the chart   Hyperlipidemia Lab Results  Component Value Date   LDLCALC 124 (H) 09/19/2019   Uncontrolled, pt declines statin or f/u lab today, for lower chol diet   Essential hypertension BP Readings from Last 3 Encounters:  10/15/21 124/64  07/25/21 (!) 162/94  12/03/20 114/82   Stable, pt to continue medical treatment amlodipine, toprol   CKD (chronic kidney disease), stage III ([place]) Lab Results  Component Value Date   CREATININE 1.63 (H) 12/03/2020   Stable overall, cont to avoid nephrotoxins, cont to f/u with renal   Chronic low back pain Uncontrolled, now mod uncontrolled, declines pain management referral, will try tramadol ER 200 qd  Depression with anxiety Mild worsenign recent in light of worsening chronic pain, also for cymbalta 30 qd  Followup: No follow-ups on file.  Cathlean Cower, MD 10/19/2021 6:06 PM [facility]

## 2021-10-15 NOTE — Patient Instructions (Addendum)
Please take all new medication as prescribed - the tramadol ER 200 mg - 1 per day for pain, AND the generic for Cymbalta 30 mg per day for pain and mood as well  Please continue all other medications as before, and refills have been done if requested.  Please have the pharmacy call with any other refills you may need.  Please continue your efforts at being more active, low cholesterol diet, and weight control.  Please keep your appointments with your specialists as you may have planned  Please make an Appointment to return in 6 months, or sooner if needed

## 2021-10-19 NOTE — Assessment & Plan Note (Signed)
Lab Results  Component Value Date   LDLCALC 124 (H) 09/19/2019   Uncontrolled, pt declines statin or f/u lab today, for lower chol diet

## 2021-10-19 NOTE — Assessment & Plan Note (Signed)
BP Readings from Last 3 Encounters:  10/15/21 124/64  07/25/21 (!) 162/94  12/03/20 114/82   Stable, pt to continue medical treatment amlodipine, toprol

## 2021-10-19 NOTE — Assessment & Plan Note (Signed)
Mild worsenign recent in light of worsening chronic pain, also for cymbalta 30 qd

## 2021-10-19 NOTE — Assessment & Plan Note (Signed)

## 2021-10-19 NOTE — Assessment & Plan Note (Signed)
Uncontrolled, now mod uncontrolled, declines pain management referral, will try tramadol ER 200 qd

## 2021-10-19 NOTE — Assessment & Plan Note (Addendum)
Lab Results  Component Value Date   CREATININE 1.63 (H) 12/03/2020   Stable overall, cont to avoid nephrotoxins, cont to f/u with renal

## 2021-10-20 DIAGNOSIS — J449 Chronic obstructive pulmonary disease, unspecified: Secondary | ICD-10-CM | POA: Insufficient documentation

## 2021-11-10 ENCOUNTER — Other Ambulatory Visit: Payer: Self-pay | Admitting: Internal Medicine

## 2021-11-10 NOTE — Telephone Encounter (Signed)
Please refill as per office routine med refill policy (all routine meds to be refilled for 3 mo or monthly (per pt preference) up to one year from last visit, then month to month grace period for 3 mo, then further med refills will have to be denied) ? ?

## 2021-11-20 ENCOUNTER — Ambulatory Visit: Payer: Medicare HMO | Admitting: Podiatry

## 2021-12-04 ENCOUNTER — Encounter: Payer: Self-pay | Admitting: Cardiovascular Disease

## 2021-12-04 ENCOUNTER — Other Ambulatory Visit: Payer: Self-pay | Admitting: Internal Medicine

## 2021-12-04 NOTE — Progress Notes (Signed)
Cardiology Office Note:    Date:  12/05/2021   ID:  [name], DOB 03-16-1941, MRN [id number]  PCP:  Biagio Borg, MD  Cardiologist:    Liam Rogers   Electrophysiologist:  n/a  Referring MD:  Dr. Scarlette Shorts  Problem List  1. Paroxysmal atrial fib  2. Hyperlipidemia 3. Essential hypertension 4. CKD - donated a kidney to her husband   Chief Complaint  Patient presents with   Atrial Fibrillation        Hypertension       Carolyn Wells is a 81 y.o. female with a hx of HTN, HL, atrial fib  CKD stage 3 (solitary kidney).   CHADS2-VASc=4 (female, age 51, HTN).  This patients CHA2DS2-VASc Score and unadjusted Ischemic Stroke Rate (% per year) is equal to 4.8 % stroke rate/year from a score of 4 Above score calculated as 1 point each if present [CHF, HTN, DM, Vascular=MI/PAD/Aortic Plaque, Age if 65-74, or Female] Above score calculated as 2 points each if present [Age > 75, or Stroke/TIA/TE]  Mar 24, 2016:  Feels very well. No symptoms.     still active.   Exercises regularly  Checks BP regularly ,  Her reading are all very good.  Is on metoprolol for rate control and mild HTN.    Nov. 20, 2017:  [name] is doing great She remains asymptomatic. She is still in atrial ablation but cannot tell that her heart beat is irregular.  April 14, 2017:  [name] is doing well Held her Xarelto for 7 days prior to back injection Staying active.   Rides her stationary bike   December 21, 2017: [name] is seen today for follow-up of her atrial fibrillation.   She also has a history of hypertension and hyperlipidemia. Is on chronic anticoagulation Has a runny nose for the past 2 years .  Has had an extensive work up - no answers yet   July 04, 2018: Feels well BP is a bit elevated. Today  Has CKD Still eats lots of salt   October 27, 2019:  Carolyn Wells is seen today for follow-up of her hypertension and persistent atrial fibrillation.   Seen with daughter,  Benjamine Mola. She has mild chronic kidney disease. Cannot tell that her HR is irreg.  Able to get out and exercise normally .   Has some back aches.  Rides her bike - no CP or dyspnea   Jan. 25, 2022: Carolyn Wells is seen today for follow up of her HTN, Atrial fib She is on chronic anticoagulaion She fell on her face while carrying xarelto . Lost her balance carrying too many groceries.  She needs to have a spinal injection She is at low risk for the procedure.   She may hold her Xarelto for 3 days prior to injection.  Ive routed the pre-op clearance .   Jan. 27, 2023; Carolyn Wells is seen for follow up of his HTN, Atrial fib She took a Tramadol 200 mg SR,  developed nausea, vomitting   Caused her to go into rapid AF  Was hospitalized in [place] with rapid atrial fib. Her metoprolol was increased  Is hydrating with gatorlyte  She needs to have her second cataract surgery    Past Medical History:  Diagnosis Date   ABDOMINAL INCISIONAL HERNIA 08/13/2009   s/p repair   Allergic rhinitis, cause unspecified 09/28/2011   BRADYCARDIA 09/26/2010   CKD (chronic kidney disease), stage III ([place]) 02/26/2015   Solitary kidney   COLONIC  POLYPS, HX OF 30/16/0109   Complication of anesthesia    DIVERTICULOSIS, COLON 08/25/2007   Dysrhythmia    GASTROESOPHAGEAL REFLUX DISEASE 08/04/2007   HYPERLIPIDEMIA 09/27/2010   not on Rx therapy   HYPERTENSION 08/06/2007   Hypothyroidism    INTERNAL HEMORRHOIDS 08/04/2007   LOW BACK PAIN 08/06/2007   scoliosis; DJD; gets injections   OSTEOPENIA 08/04/2007   PAF (paroxysmal atrial fibrillation) ([place])    a.  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    Past Surgical History:  Procedure Laterality Date   LAMINECTOMY  1999   L4-5/lumbar   NEPHRECTOMY     Right   s/o incisional hernia  repair     RUQ   STRABISMUS SURGERY Bilateral 04/14/2019   Procedure: STRABISMUS REPAIR BILATERAL;  Surgeon: Everitt Amber, MD;  Location: [facility];  Service: Ophthalmology;  Laterality: Bilateral;   TONSILLECTOMY      Current Medications: Outpatient Medications Prior to Visit  Medication Sig Dispense Refill   amLODipine (NORVASC) 2.5 MG tablet Take 1 tablet by mouth once daily 90 tablet 3   Ascorbic Acid (VITAMIN C) 1000 MG tablet Take 1,000 mg by mouth daily.     levothyroxine (SYNTHROID) 50 MCG tablet TAKE 1 TABLET BY MOUTH ONCE DAILY BEFORE BREAKFAST 90 tablet 3   LUMIGAN 0.01 % SOLN Place 1 drop into both eyes at bedtime.      magnesium oxide (MAG-OX) 400 MG tablet Take 400 mg by mouth daily.     Multiple Vitamins-Minerals (PRESERVISION AREDS 2 PO) Take 1 tablet by mouth 2 (two) times daily.     Multiple Vitamins-Minerals (WOMENS 50+ MULTI VITAMIN/MIN PO) Take 1 tablet by mouth daily.     pantoprazole (PROTONIX) 40 MG tablet Take 1 tablet by mouth once daily 90 tablet 3   Rivaroxaban (XARELTO) 15 MG TABS tablet TAKE 1 TABLET BY MOUTH ONCE DAILY WITH SUPPER 90 tablet 1   VITAMIN K PO Take 100 mcg by mouth daily.     XIIDRA 5 % SOLN Place 1 drop into both eyes 2 (two) times daily.     levothyroxine (EUTHYROX) 50 MCG tablet TAKE 1 TABLET BY MOUTH ONCE DAILY BEFORE BREAKFAST 90 tablet 3   metoprolol succinate (TOPROL-XL) 25 MG 24 hr tablet Take 25 mg by mouth daily.     metoprolol succinate (TOPROL-XL) 50 MG 24 hr tablet Take 1 tablet (50 mg total) by mouth daily. 90 tablet 3   Calcium Citrate (CITRACAL PO) Take 2 tablets by mouth daily.  (Patient not taking: Reported on 12/05/2021)     DULoxetine (CYMBALTA) 30 MG capsule Take 1 capsule (30 mg total) by mouth daily. (Patient not taking: Reported on 12/05/2021) 90 capsule 3   gabapentin (NEURONTIN) 100 MG capsule Take 1 capsule (100 mg total) by mouth at bedtime. (Patient not taking: Reported on 12/05/2021) 90 capsule 3    traMADol (ULTRAM-ER) 200 MG 24 hr tablet Take 1 tablet (200 mg total) by mouth daily. (Patient not taking: Reported on 12/05/2021) 30 tablet 5   No facility-administered medications prior to visit.     Allergies:   Hydrocodone and Ipratropium   Social History   Socioeconomic History   Marital status: Married    Spouse name: Not on file   Number of children: 2  Years of education: Not on file   Highest education level: Not on file  Occupational History   Occupation: retired Artist co.    Employer: RETIRED  Tobacco Use   Smoking status: Never   Smokeless tobacco: Never  Substance and Sexual Activity   Alcohol use: No    Alcohol/week: 0.0 standard drinks   Drug use: No   Sexual activity: Not on file  Other Topics Concern   Not on file  Social History Narrative   Husband disabled, renal failure, DM, HTN.   Originally from [place], [place] >> came to [place] 1966 (husband in [employer])   Social Determinants of Radio broadcast assistant Strain: Not on file  Food Insecurity: Not on file  Transportation Needs: Not on file  Physical Activity: Not on file  Stress: Not on file  Social Connections: Not on file     Family History:  The patient's family history includes Alzheimer's disease in her brother; Coronary artery disease (age of onset: 4) in her mother; Diabetes in her mother and another family member; Heart attack (age of onset: 67) in her father.   ROS:   As noted in current hx.    Physical Exam: Blood pressure 138/80, pulse 80, height 5\' 6"  (1.676 m), weight 149 lb 12.8 oz (67.9 kg), SpO2 97 %.  GEN:  Well nourished, well developed in no acute distress HEENT: Normal NECK: No JVD; No carotid bruits LYMPHATICS: No lymphadenopathy CARDIAC:  irreg. Irreg.  RESPIRATORY:  Clear to auscultation without rales, wheezing or rhonchi  ABDOMEN: Soft, non-tender, non-distended MUSCULOSKELETAL:  No edema; No deformity  SKIN: Warm and dry NEUROLOGIC:  Alert and  oriented x 3    ECG : December 05, 2021: Atrial fibrillation with a heart rate of 85.  Nonspecific ST and T wave changes.  No changes from previous EKG.   Wt Readings from Last 3 Encounters:  12/05/21 149 lb 12.8 oz (67.9 kg)  10/15/21 147 lb 12.8 oz (67 kg)  12/03/20 149 lb 9.6 oz (67.9 kg)      Studies/Labs Reviewed:     Recent Labs: No results found for requested labs within last 8760 hours.   Recent Lipid Panel    Component Value Date/Time   CHOL 230 (H) 09/19/2019 1418   TRIG 189.0 (H) 09/19/2019 1418   HDL 68.30 09/19/2019 1418   CHOLHDL 3 09/19/2019 1418   VLDL 37.8 09/19/2019 1418   LDLCALC 124 (H) 09/19/2019 1418   LDLDIRECT 117.7 09/26/2013 1420     Assessment / PLAN:      1.  Chronic atrial fibrillation:    cont meds.   Meteprolol XL  is now 75 mg a day .  HR is well controlled . She is hydrating better    2. HTN  - bp is well controlled.   3. CKD -   creatinine has been stable .   Was 1.29 on Dec, 14, 2022   5. Hyperlipidemia:  lipids have been stable    [name], MD  12/05/2021 11:48 AM    [address], [place], [place]  [address] Phone: [phone]; Fax: [phone]

## 2021-12-04 NOTE — Telephone Encounter (Signed)
Please refill as per office routine med refill policy (all routine meds to be refilled for 3 mo or monthly (per pt preference) up to one year from last visit, then month to month grace period for 3 mo, then further med refills will have to be denied) ? ?

## 2021-12-05 ENCOUNTER — Other Ambulatory Visit: Payer: Self-pay

## 2021-12-05 ENCOUNTER — Encounter: Payer: Self-pay | Admitting: Cardiovascular Disease

## 2021-12-05 ENCOUNTER — Ambulatory Visit (INDEPENDENT_AMBULATORY_CARE_PROVIDER_SITE_OTHER): Payer: Medicare HMO | Admitting: Cardiovascular Disease

## 2021-12-05 VITALS — BP 138/80 | HR 80 | Ht 66.0 in | Wt 149.8 lb

## 2021-12-05 DIAGNOSIS — I10 Essential (primary) hypertension: Secondary | ICD-10-CM | POA: Diagnosis not present

## 2021-12-05 DIAGNOSIS — I482 Chronic atrial fibrillation, unspecified: Secondary | ICD-10-CM

## 2021-12-05 MED ORDER — METOPROLOL SUCCINATE ER 25 MG PO TB24: 75.0000 mg | ORAL_TABLET | Freq: Every day | ORAL | 3 refills | Status: DC

## 2021-12-05 NOTE — Patient Instructions (Addendum)
Medication Instructions:  Your physician has recommended you make the following change in your medication:  CHANGE the Toprol to 25 mg taking 3 tablets daily    *If you need a refill on your cardiac medications before your next appointment, please call your pharmacy*   Lab Work: None ordered  If you have labs (blood work) drawn today and your tests are completely normal, you will receive your results only by: [facility] (if you have MyChart) OR A paper copy in the mail If you have any lab test that is abnormal or we need to change your treatment, we will call you to review the results.   Testing/Procedures: None ordered   Follow-Up: At [facility], you and your health needs are our priority.  As part of our continuing mission to provide you with exceptional heart care, we have created designated Provider Care Teams.  These Care Teams include your primary Cardiologist (physician) and Advanced Practice Providers (APPs -  Physician Assistants and Nurse Practitioners) who all work together to provide you with the care you need, when you need it.  We recommend signing up for the patient portal called "MyChart".  Sign up information is provided on this After Visit Summary.  MyChart is used to connect with patients for Virtual Visits (Telemedicine).  Patients are able to view lab/test results, encounter notes, upcoming appointments, etc.  Non-urgent messages can be sent to your provider as well.   To learn more about what you can do with MyChart, go to [email or web address].    Your next appointment:   12 month(s)  The format for your next appointment:   In Person  Provider:   [name], MD  or Robbie Lis, PA-C, Christen Bame, NP, or Richardson Dopp, PA-C         Other Instructions

## 2022-02-02 ENCOUNTER — Telehealth: Payer: Self-pay | Admitting: Internal Medicine

## 2022-02-02 NOTE — Telephone Encounter (Signed)
Left message for patient to call back to schedule Medicare Annual Wellness Visit  ? ?Last AWV  05/05/17 ? ?Please schedule at anytime with [facility] if patient calls the office back.   ? ? ?Any questions, please call me at [phone]  ?

## 2022-02-06 ENCOUNTER — Ambulatory Visit (INDEPENDENT_AMBULATORY_CARE_PROVIDER_SITE_OTHER): Payer: Medicare HMO

## 2022-02-06 DIAGNOSIS — Z0000 Encounter for general adult medical examination without abnormal findings: Secondary | ICD-10-CM

## 2022-02-06 NOTE — Progress Notes (Signed)
?I connected with Hassel Neth today by telephone and verified that I am speaking with the correct person using two identifiers. ?Location patient: home ?Location provider: work ?Persons participating in the virtual visit: patient, provider. ?  ?I discussed the limitations, risks, security and privacy concerns of performing an evaluation and management service by telephone and the availability of in person appointments. I also discussed with the patient that there may be a patient responsible charge related to this service. The patient expressed understanding and verbally consented to this telephonic visit.  ?  ?Interactive audio and video telecommunications were attempted between this provider and patient, however failed, due to patient having technical difficulties OR patient did not have access to video capability.  We continued and completed visit with audio only. ? ?Some vital signs may be absent or patient reported.  ? ?Time Spent with patient on telephone encounter: 30 minutes ? ?Subjective:  ? Carolyn Wells is a 81 y.o. female who presents for Medicare Annual (Subsequent) preventive examination. ? ?Review of Systems    ? ?Cardiac Risk Factors include: advanced age (>47mn, >>1women);dyslipidemia;hypertension;family history of premature cardiovascular disease ? ?   ?Objective:  ?  ?There were no vitals filed for this visit. ?There is no height or weight on file to calculate BMI. ? ? ?  02/06/2022  ?  9:47 AM 04/14/2019  ?  6:34 AM 04/10/2019  ?  2:47 PM 05/05/2017  ? 11:47 AM 05/04/2016  ? 11:18 AM 05/06/2015  ?  1:55 PM  ?Advanced Directives  ?Does Patient Have a Medical Advance Directive? Yes No No No No No;Yes  ?Type of Advance Directive Living will;Healthcare Power of ASarah Ann ?Does patient want to make changes to medical advance directive? No - Patient declined   No - Patient declined  No - Patient declined  ?Copy of [place] Chart? No - copy  requested       ?Would patient like information on creating a medical advance directive?  No - Patient declined No - Patient declined  No - patient declined information   ? ? ?Current Medications (verified) ?Outpatient Encounter Medications as of 02/06/2022  ?Medication Sig  ? amLODipine (NORVASC) 2.5 MG tablet Take 1 tablet by mouth once daily  ? Ascorbic Acid (VITAMIN C) 1000 MG tablet Take 1,000 mg by mouth daily.  ? Calcium Citrate (CITRACAL PO) Take 2 tablets by mouth daily.  (Patient not taking: Reported on 12/05/2021)  ? DULoxetine (CYMBALTA) 30 MG capsule Take 1 capsule (30 mg total) by mouth daily. (Patient not taking: Reported on 12/05/2021)  ? gabapentin (NEURONTIN) 100 MG capsule Take 1 capsule (100 mg total) by mouth at bedtime. (Patient not taking: Reported on 12/05/2021)  ? levothyroxine (SYNTHROID) 50 MCG tablet TAKE 1 TABLET BY MOUTH ONCE DAILY BEFORE BREAKFAST  ? LUMIGAN 0.01 % SOLN Place 1 drop into both eyes at bedtime.   ? magnesium oxide (MAG-OX) 400 MG tablet Take 400 mg by mouth daily.  ? metoprolol succinate (TOPROL XL) 25 MG 24 hr tablet Take 3 tablets (75 mg total) by mouth daily.  ? Multiple Vitamins-Minerals (PRESERVISION AREDS 2 PO) Take 1 tablet by mouth 2 (two) times daily.  ? Multiple Vitamins-Minerals (WOMENS 50+ MULTI VITAMIN/MIN PO) Take 1 tablet by mouth daily.  ? pantoprazole (PROTONIX) 40 MG tablet Take 1 tablet by mouth once daily  ? Rivaroxaban (XARELTO) 15 MG TABS tablet TAKE 1 TABLET BY MOUTH ONCE  DAILY WITH SUPPER  ? traMADol (ULTRAM-ER) 200 MG 24 hr tablet Take 1 tablet (200 mg total) by mouth daily. (Patient not taking: Reported on 12/05/2021)  ? VITAMIN K PO Take 100 mcg by mouth daily.  ? XIIDRA 5 % SOLN Place 1 drop into both eyes 2 (two) times daily.  ? ?No facility-administered encounter medications on file as of 02/06/2022.  ? ? ?Allergies (verified) ?Hydrocodone and Ipratropium  ? ?History: ?Past Medical History:  ?Diagnosis Date  ? ABDOMINAL INCISIONAL HERNIA 08/13/2009   ? s/p repair  ? Allergic rhinitis, cause unspecified 09/28/2011  ? BRADYCARDIA 09/26/2010  ? CKD (chronic kidney disease), stage III ([facility] [facility]) 02/26/2015  ? Solitary kidney  ? COLONIC POLYPS, HX OF 08/25/2007  ? Complication of anesthesia   ? DIVERTICULOSIS, COLON 08/25/2007  ? Dysrhythmia   ? GASTROESOPHAGEAL REFLUX DISEASE 08/04/2007  ? HYPERLIPIDEMIA 09/27/2010  ? not on Rx therapy  ? HYPERTENSION 08/06/2007  ? Hypothyroidism   ? INTERNAL HEMORRHOIDS 08/04/2007  ? LOW BACK PAIN 08/06/2007  ? scoliosis; DJD; gets injections  ? OSTEOPENIA 08/04/2007  ? PAF (paroxysmal atrial fibrillation) ([facility] [facility])   ? a.  Echo 2/17:  Vigorous LVF, EF 65-70%, no RWMA, Ao sclerosis, trivial AI, trivial MR, trivial TR, PASP 23 mmHg  ? PONV (postoperative nausea and vomiting)   ? Solitary kidney, acquired 09/28/2011  ? donated R kidney to husband  ? ?Past Surgical History:  ?Procedure Laterality Date  ? LAMINECTOMY  1999  ? L4-5/lumbar  ? NEPHRECTOMY    ? Right  ? s/o incisional hernia repair    ? RUQ  ? STRABISMUS SURGERY Bilateral 04/14/2019  ? Procedure: STRABISMUS REPAIR BILATERAL;  Surgeon: Everitt Amber, MD;  Location: [facility];  Service: Ophthalmology;  Laterality: Bilateral;  ? TONSILLECTOMY    ? ?Family History  ?Problem Relation Age of Onset  ? Coronary artery disease Mother 50  ?     1st degree relative   ? Diabetes Mother   ? Heart attack Father 50  ? Diabetes Other   ?     1st degree relative  ? Alzheimer's disease Brother   ? ?Social History  ? ?Socioeconomic History  ? Marital status: Married  ?  Spouse name: Not on file  ? Number of children: 2  ? Years of education: Not on file  ? Highest education level: Not on file  ?Occupational History  ? 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  ?  Employer: RETIRED  ?Tobacco Use  ? Smoking status: Never  ? Smokeless tobacco: Never  ?Substance and Sexual Activity  ? Alcohol use: No  ?  Alcohol/week: 0.0 standard drinks  ? Drug use: No  ? Sexual activity: Not on file   ?Other Topics Concern  ? Not on file  ?Social History Narrative  ? Husband disabled, renal failure, DM, HTN.  ? Originally from [place], [place] >> came to [place] 1966 (husband in [employer])  ? ?Social Determinants of Health  ? ?Financial Resource Strain: Low Risk   ? Difficulty of Paying Living Expenses: Not hard at all  ?Food Insecurity: No Food Insecurity  ? Worried About Charity fundraiser in the Last Year: Never true  ? Ran Out of Food in the Last Year: Never true  ?Transportation Needs: No Transportation Needs  ? Lack of Transportation (Medical): No  ? Lack of Transportation (Non-Medical): No  ?Physical Activity: Sufficiently Active  ? Days of Exercise per Week: 7 days  ? Minutes of Exercise per  Session: 60 min  ?Stress: No Stress Concern Present  ? Feeling of Stress : Not at all  ?Social Connections: Moderately Integrated  ? Frequency of Communication with Friends and Family: More than three times a week  ? Frequency of Social Gatherings with Friends and Family: More than three times a week  ? Attends Religious Services: More than 4 times per year  ? Active Member of Clubs or Organizations: No  ? Attends Archivist Meetings: More than 4 times per year  ? Marital Status: Widowed  ? ? ?Tobacco Counseling ?Counseling given: Not Answered ? ? ?Clinical Intake: ? ?Pre-visit preparation completed: Yes ? ?Pain : No/denies pain ? ?  ? ?Nutritional Risks: None ?Diabetes: No ? ?How often do you need to have someone help you when you read instructions, pamphlets, or other written materials from your doctor or pharmacy?: 1 - Never ?What is the last grade level you completed in school?: HSG ? ?Diabetic? no ? ?Interpreter Needed?: No ? ?Information entered by :: Lisette Abu, LPN ? ? ?Activities of Daily Living ? ?  02/06/2022  ?  9:48 AM 10/15/2021  ?  2:47 PM  ?In your present state of health, do you have any difficulty performing the following activities:  ?Hearing? 0 0  ?Vision? 0 0  ?Difficulty concentrating or  making decisions? 0 0  ?Walking or climbing stairs? 0 0  ?Dressing or bathing? 0 0  ?Doing errands, shopping? 0 0  ?Preparing Food and eating ? N   ?Using the Toilet? N   ?In the past six months, have you acc

## 2022-02-06 NOTE — Patient Instructions (Signed)
Carolyn Wells , ?Thank you for taking time to come for your Medicare Wellness Visit. I appreciate your ongoing commitment to your health goals. Please review the following plan we discussed and let me know if I can assist you in the future.  ? ?Screening recommendations/referrals: ?Colonoscopy: Not a candidate for screening due to age ?Mammogram: 03/14/2021; due every year ?Bone Density: 05/03/2017; no longer recommended ?Recommended yearly ophthalmology/optometry visit for glaucoma screening and checkup ?Recommended yearly dental visit for hygiene and checkup ? ?Vaccinations: ?Influenza vaccine: 10/08/2021 ?Pneumococcal vaccine: 03/27/2011, 09/28/2013 ?Tdap vaccine: 09/19/2019; due every 10 years ?Shingles vaccine: 09/20/2019, 11/19/2019   ?Covid-19: 01/11/2020, 02/06/2020, 08/07/2020 ? ?Advanced directives: Please bring a copy of your health care power of attorney and living will to the office at your convenience. ? ?Conditions/risks identified: Yes ? ?Next appointment: Please schedule your next Medicare Wellness Visit with your Nurse Health Advisor in 1 year or 366 days by calling [phone]. ? ? ?Preventive Care 22 Years and Older, Female ?Preventive care refers to lifestyle choices and visits with your health care provider that can promote health and wellness. ?What does preventive care include? ?A yearly physical exam. This is also called an annual well check. ?Dental exams once or twice a year. ?Routine eye exams. Ask your health care provider how often you should have your eyes checked. ?Personal lifestyle choices, including: ?Daily care of your teeth and gums. ?Regular physical activity. ?Eating a healthy diet. ?Avoiding tobacco and drug use. ?Limiting alcohol use. ?Practicing safe sex. ?Taking low-dose aspirin every day. ?Taking vitamin and mineral supplements as recommended by your health care provider. ?What happens during an annual well check? ?The services and screenings done by your health care provider  during your annual well check will depend on your age, overall health, lifestyle risk factors, and family history of disease. ?Counseling  ?Your health care provider may ask you questions about your: ?Alcohol use. ?Tobacco use. ?Drug use. ?Emotional well-being. ?Home and relationship well-being. ?Sexual activity. ?Eating habits. ?History of falls. ?Memory and ability to understand (cognition). ?Work and work Statistician. ?Reproductive health. ?Screening  ?You may have the following tests or measurements: ?Height, weight, and BMI. ?Blood pressure. ?Lipid and cholesterol levels. These may be checked every 5 years, or more frequently if you are over 67 years old. ?Skin check. ?Lung cancer screening. You may have this screening every year starting at age 22 if you have a 30-pack-year history of smoking and currently smoke or have quit within the past 15 years. ?Fecal occult blood test (FOBT) of the stool. You may have this test every year starting at age 73. ?Flexible sigmoidoscopy or colonoscopy. You may have a sigmoidoscopy every 5 years or a colonoscopy every 10 years starting at age 59. ?Hepatitis C blood test. ?Hepatitis B blood test. ?Sexually transmitted disease (STD) testing. ?Diabetes screening. This is done by checking your blood sugar (glucose) after you have not eaten for a while (fasting). You may have this done every 1-3 years. ?Bone density scan. This is done to screen for osteoporosis. You may have this done starting at age 84. ?Mammogram. This may be done every 1-2 years. Talk to your health care provider about how often you should have regular mammograms. ?Talk with your health care provider about your test results, treatment options, and if necessary, the need for more tests. ?Vaccines  ?Your health care provider may recommend certain vaccines, such as: ?Influenza vaccine. This is recommended every year. ?Tetanus, diphtheria, and acellular pertussis (Tdap, Td) vaccine. You may  need a Td booster every  10 years. ?Zoster vaccine. You may need this after age 63. ?Pneumococcal 13-valent conjugate (PCV13) vaccine. One dose is recommended after age 47. ?Pneumococcal polysaccharide (PPSV23) vaccine. One dose is recommended after age 21. ?Talk to your health care provider about which screenings and vaccines you need and how often you need them. ?This information is not intended to replace advice given to you by your health care provider. Make sure you discuss any questions you have with your health care provider. ?Document Released: 11/22/2015 Document Revised: 07/15/2016 Document Reviewed: 08/27/2015 ?Elsevier Interactive Patient Education ? 2017 [employer]. ? ?Fall Prevention in the Home ?Falls can cause injuries. They can happen to people of all ages. There are many things you can do to make your home safe and to help prevent falls. ?What can I do on the outside of my home? ?Regularly fix the edges of walkways and driveways and fix any cracks. ?Remove anything that might make you trip as you walk through a door, such as a raised step or threshold. ?Trim any bushes or trees on the path to your home. ?Use bright outdoor lighting. ?Clear any walking paths of anything that might make someone trip, such as rocks or tools. ?Regularly check to see if handrails are loose or broken. Make sure that both sides of any steps have handrails. ?Any raised decks and porches should have guardrails on the edges. ?Have any leaves, snow, or ice cleared regularly. ?Use sand or salt on walking paths during winter. ?Clean up any spills in your garage right away. This includes oil or grease spills. ?What can I do in the bathroom? ?Use night lights. ?Install grab bars by the toilet and in the tub and shower. Do not use towel bars as grab bars. ?Use non-skid mats or decals in the tub or shower. ?If you need to sit down in the shower, use a plastic, non-slip stool. ?Keep the floor dry. Clean up any water that spills on the floor as soon as it  happens. ?Remove soap buildup in the tub or shower regularly. ?Attach bath mats securely with double-sided non-slip rug tape. ?Do not have throw rugs and other things on the floor that can make you trip. ?What can I do in the bedroom? ?Use night lights. ?Make sure that you have a light by your bed that is easy to reach. ?Do not use any sheets or blankets that are too big for your bed. They should not hang down onto the floor. ?Have a firm chair that has side arms. You can use this for support while you get dressed. ?Do not have throw rugs and other things on the floor that can make you trip. ?What can I do in the kitchen? ?Clean up any spills right away. ?Avoid walking on wet floors. ?Keep items that you use a lot in easy-to-reach places. ?If you need to reach something above you, use a strong step stool that has a grab bar. ?Keep electrical cords out of the way. ?Do not use floor polish or wax that makes floors slippery. If you must use wax, use non-skid floor wax. ?Do not have throw rugs and other things on the floor that can make you trip. ?What can I do with my stairs? ?Do not leave any items on the stairs. ?Make sure that there are handrails on both sides of the stairs and use them. Fix handrails that are broken or loose. Make sure that handrails are as long as the stairways. ?  Check any carpeting to make sure that it is firmly attached to the stairs. Fix any carpet that is loose or worn. ?Avoid having throw rugs at the top or bottom of the stairs. If you do have throw rugs, attach them to the floor with carpet tape. ?Make sure that you have a light switch at the top of the stairs and the bottom of the stairs. If you do not have them, ask someone to add them for you. ?What else can I do to help prevent falls? ?Wear shoes that: ?Do not have high heels. ?Have rubber bottoms. ?Are comfortable and fit you well. ?Are closed at the toe. Do not wear sandals. ?If you use a stepladder: ?Make sure that it is fully opened.  Do not climb a closed stepladder. ?Make sure that both sides of the stepladder are locked into place. ?Ask someone to hold it for you, if possible. ?Clearly mark and make sure that you can see: ?Any grab

## 2022-03-09 ENCOUNTER — Other Ambulatory Visit: Payer: Self-pay | Admitting: Cardiovascular Disease

## 2022-03-10 NOTE — Telephone Encounter (Signed)
Prescription refill request for Xarelto received.  ?Indication: Atrial Fib ?Last office visit: 12/05/21  Rae Halsted MD ?Weight: 67.9kg ?Age: 81 ?Scr: 1.29 on 10/22/21 ?CrCl: 36.66 ? ?Based on above findings Xarelto '15mg'$  daily is the appropriate dose.  Refill approved. ? ?

## 2022-03-20 LAB — HM MAMMOGRAPHY

## 2022-04-15 ENCOUNTER — Ambulatory Visit (INDEPENDENT_AMBULATORY_CARE_PROVIDER_SITE_OTHER): Payer: Medicare HMO | Admitting: Internal Medicine

## 2022-04-15 ENCOUNTER — Encounter: Payer: Self-pay | Admitting: Internal Medicine

## 2022-04-15 VITALS — BP 122/68 | HR 52 | Temp 97.9°F | Ht 66.0 in | Wt 151.0 lb

## 2022-04-15 DIAGNOSIS — R739 Hyperglycemia, unspecified: Secondary | ICD-10-CM | POA: Diagnosis not present

## 2022-04-15 DIAGNOSIS — E7800 Pure hypercholesterolemia, unspecified: Secondary | ICD-10-CM

## 2022-04-15 DIAGNOSIS — E039 Hypothyroidism, unspecified: Secondary | ICD-10-CM | POA: Diagnosis not present

## 2022-04-15 DIAGNOSIS — K649 Unspecified hemorrhoids: Secondary | ICD-10-CM

## 2022-04-15 DIAGNOSIS — Z0001 Encounter for general adult medical examination with abnormal findings: Secondary | ICD-10-CM

## 2022-04-15 DIAGNOSIS — E538 Deficiency of other specified B group vitamins: Secondary | ICD-10-CM | POA: Diagnosis not present

## 2022-04-15 DIAGNOSIS — I10 Essential (primary) hypertension: Secondary | ICD-10-CM | POA: Diagnosis not present

## 2022-04-15 DIAGNOSIS — E559 Vitamin D deficiency, unspecified: Secondary | ICD-10-CM

## 2022-04-15 DIAGNOSIS — N1831 Chronic kidney disease, stage 3a: Secondary | ICD-10-CM

## 2022-04-15 LAB — URINALYSIS, ROUTINE W REFLEX MICROSCOPIC
Bilirubin Urine: NEGATIVE
Hgb urine dipstick: NEGATIVE
Ketones, ur: NEGATIVE
Leukocytes,Ua: NEGATIVE
Nitrite: NEGATIVE
RBC / HPF: NONE SEEN (ref 0–?)
Specific Gravity, Urine: 1.015 (ref 1.000–1.030)
Total Protein, Urine: NEGATIVE
Urine Glucose: NEGATIVE
Urobilinogen, UA: 0.2 (ref 0.0–1.0)
pH: 5.5 (ref 5.0–8.0)

## 2022-04-15 LAB — LIPID PANEL
Cholesterol: 225 mg/dL — ABNORMAL HIGH (ref 0–200)
HDL: 76 mg/dL (ref >39.00)
LDL Cholesterol: 111 mg/dL — ABNORMAL HIGH (ref 0–99)
NonHDL: 148.97
Total CHOL/HDL Ratio: 3
Triglycerides: 189 mg/dL — ABNORMAL HIGH (ref 0.0–149.0)
VLDL: 37.8 mg/dL (ref 0.0–40.0)

## 2022-04-15 LAB — CBC WITH DIFFERENTIAL/PLATELET
Basophils Absolute: 0 10*3/uL (ref 0.0–0.1)
Basophils Relative: 0.4 % (ref 0.0–3.0)
Eosinophils Absolute: 0.1 10*3/uL (ref 0.0–0.7)
Eosinophils Relative: 0.8 % (ref 0.0–5.0)
HCT: 39.4 % (ref 36.0–46.0)
Hemoglobin: 13.1 g/dL (ref 12.0–15.0)
Lymphocytes Relative: 12.5 % (ref 12.0–46.0)
Lymphs Abs: 1.4 10*3/uL (ref 0.7–4.0)
MCHC: 33.4 g/dL (ref 30.0–36.0)
MCV: 99 fl (ref 78.0–100.0)
Monocytes Absolute: 1.1 10*3/uL — ABNORMAL HIGH (ref 0.1–1.0)
Monocytes Relative: 10 % (ref 3.0–12.0)
Neutro Abs: 8.4 10*3/uL — ABNORMAL HIGH (ref 1.4–7.7)
Neutrophils Relative %: 76.3 % (ref 43.0–77.0)
Platelets: 241 10*3/uL (ref 150.0–400.0)
RBC: 3.98 Mil/uL (ref 3.87–5.11)
RDW: 14 % (ref 11.5–15.5)
WBC: 10.9 10*3/uL — ABNORMAL HIGH (ref 4.0–10.5)

## 2022-04-15 LAB — HEPATIC FUNCTION PANEL
ALT: 29 U/L (ref 0–35)
AST: 23 U/L (ref 0–37)
Albumin: 4.2 g/dL (ref 3.5–5.2)
Alkaline Phosphatase: 110 U/L (ref 39–117)
Bilirubin, Direct: 0.2 mg/dL (ref 0.0–0.3)
Total Bilirubin: 0.5 mg/dL (ref 0.2–1.2)
Total Protein: 7.2 g/dL (ref 6.0–8.3)

## 2022-04-15 LAB — BASIC METABOLIC PANEL
BUN: 24 mg/dL — ABNORMAL HIGH (ref 6–23)
CO2: 26 mEq/L (ref 19–32)
Calcium: 10 mg/dL (ref 8.4–10.5)
Chloride: 105 mEq/L (ref 96–112)
Creatinine, Ser: 1.45 mg/dL — ABNORMAL HIGH (ref 0.40–1.20)
GFR: 33.74 mL/min — ABNORMAL LOW (ref >60.00)
Glucose, Bld: 96 mg/dL (ref 70–99)
Potassium: 5 mEq/L (ref 3.5–5.1)
Sodium: 140 mEq/L (ref 135–145)

## 2022-04-15 LAB — HEMOGLOBIN A1C: Hgb A1c MFr Bld: 5.8 % (ref 4.6–6.5)

## 2022-04-15 LAB — VITAMIN D 25 HYDROXY (VIT D DEFICIENCY, FRACTURES): VITD: 80.26 ng/mL (ref 30.00–100.00)

## 2022-04-15 LAB — TSH: TSH: 3.25 u[IU]/mL (ref 0.35–5.50)

## 2022-04-15 LAB — VITAMIN B12: Vitamin B-12: 717 pg/mL (ref 211–911)

## 2022-04-15 NOTE — Progress Notes (Signed)
Patient ID: Carolyn Wells, female   DOB: 1941/07/10, 81 y.o.   MRN: [id number]         Chief Complaint:: wellness exam and hemorrhoid pain, low thyroid, hld, htn, ckd        HPI:  Carolyn Wells is a 81 y.o. female here for wellness exam; up to date                        Also S/p bilateral cataract surgury feb 23.  Sees renal every 6 mo.  Pt denies chest pain, increased sob or doe, wheezing, orthopnea, PND, increased LE swelling, palpitations, dizziness or syncope.   Pt denies polydipsia, polyuria, or new focal neuro s/s.    Pt denies fever, wt loss, night sweats, loss of appetite, or other constitutional symptoms  Pt continues to have recurring LBP without change in severity, bowel or bladder change, fever, wt loss,  worsening LE pain/numbness/weakness, gait change or falls.  Does have persistent worsening hemorrhoid pain for > 3 mo, asking for referral as topical and supp med not working, has occasional small BRBPR  Wt Readings from Last 3 Encounters:  04/15/22 151 lb (68.5 kg)  12/05/21 149 lb 12.8 oz (67.9 kg)  10/15/21 147 lb 12.8 oz (67 kg)   BP Readings from Last 3 Encounters:  04/15/22 122/68  12/05/21 138/80  10/15/21 124/64   Immunization History  Administered Date(s) Administered   Fluad Quad(high Dose 65+) 10/09/2020, 10/08/2021   Influenza Split 09/28/2011, 09/28/2012   Influenza Whole 09/10/2006, 08/25/2007, 08/30/2008, 08/13/2009, 09/26/2010   Influenza, High Dose Seasonal PF 08/27/2017, 09/16/2018   Influenza, Seasonal, Injecte, Preservative Fre 09/01/2013   Influenza,inj,Quad PF,6+ Mos 08/28/2015   PFIZER(Purple Top)SARS-COV-2 Vaccination 01/11/2020, 02/06/2020, 08/07/2020   Pneumococcal Conjugate-13 09/28/2013   Pneumococcal Polysaccharide-23 08/09/2005, 08/10/2005, 03/27/2011   Td 11/09/1989, 08/13/2009   Tdap 09/19/2019   Zoster Recombinat (Shingrix) 09/20/2019, 11/19/2019   Zoster, Live 07/28/2006  There are no preventive care reminders to display for  this patient.    Past Medical History:  Diagnosis Date   ABDOMINAL INCISIONAL HERNIA 08/13/2009   s/p repair   Allergic rhinitis, cause unspecified 09/28/2011   BRADYCARDIA 09/26/2010   CKD (chronic kidney disease), stage III ([place]) 02/26/2015   Solitary kidney   COLONIC POLYPS, HX OF 47/07/6282   Complication of anesthesia    DIVERTICULOSIS, COLON 08/25/2007   Dysrhythmia    GASTROESOPHAGEAL REFLUX DISEASE 08/04/2007   HYPERLIPIDEMIA 09/27/2010   not on Rx therapy   HYPERTENSION 08/06/2007   Hypothyroidism    INTERNAL HEMORRHOIDS 08/04/2007   LOW BACK PAIN 08/06/2007   scoliosis; DJD; gets injections   OSTEOPENIA 08/04/2007   PAF (paroxysmal atrial fibrillation) ([place])    a.  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   Past Surgical History:  Procedure Laterality Date   LAMINECTOMY  1999   L4-5/lumbar   NEPHRECTOMY     Right   s/o incisional hernia repair     RUQ   STRABISMUS SURGERY Bilateral 04/14/2019   Procedure: STRABISMUS REPAIR BILATERAL;  Surgeon: Everitt Amber, MD;  Location: [facility];  Service: Ophthalmology;  Laterality: Bilateral;   TONSILLECTOMY      reports that she has never smoked. She has never used smokeless tobacco. She reports that  she does not drink alcohol and does not use drugs. family history includes Alzheimer's disease in her brother; Coronary artery disease (age of onset: 38) in her mother; Diabetes in her mother and another family member; Heart attack (age of onset: 33) in her father. Allergies  Allergen Reactions   Tramadol Other (See Comments)    Loopy feeling   Hydrocodone Nausea Only and Other (See Comments)    lethargy   Ipratropium     Causes nasal bleeding   Current Outpatient Medications on File Prior to Visit  Medication Sig Dispense Refill    amLODipine (NORVASC) 2.5 MG tablet Take 1 tablet by mouth once daily 90 tablet 3   Ascorbic Acid (VITAMIN C) 1000 MG tablet Take 1,000 mg by mouth daily.     levothyroxine (SYNTHROID) 50 MCG tablet TAKE 1 TABLET BY MOUTH ONCE DAILY BEFORE BREAKFAST 90 tablet 3   LUMIGAN 0.01 % SOLN Place 1 drop into both eyes at bedtime.      metoprolol succinate (TOPROL XL) 25 MG 24 hr tablet Take 3 tablets (75 mg total) by mouth daily. 270 tablet 3   Multiple Vitamins-Minerals (PRESERVISION AREDS 2 PO) Take 1 tablet by mouth 2 (two) times daily.     Multiple Vitamins-Minerals (WOMENS 50+ MULTI VITAMIN/MIN PO) Take 1 tablet by mouth daily.     pantoprazole (PROTONIX) 40 MG tablet Take 1 tablet by mouth once daily 90 tablet 3   Rivaroxaban (XARELTO) 15 MG TABS tablet TAKE 1 TABLET BY MOUTH ONCE DAILY WITH SUPPER 90 tablet 1   VITAMIN K PO Take 100 mcg by mouth daily.     No current facility-administered medications on file prior to visit.        ROS:  All others reviewed and negative.  Objective        PE:  BP 122/68 (BP Location: Right Arm, Patient Position: Sitting, Cuff Size: Normal)   Pulse (!) 52   Temp 97.9 F (36.6 C) (Oral)   Ht '5\' 6"'$  (1.676 m)   Wt 151 lb (68.5 kg)   SpO2 100%   BMI 24.37 kg/m                 Constitutional: Pt appears in NAD               HENT: Head: NCAT.                Right Ear: External ear normal.                 Left Ear: External ear normal.                Eyes: . Pupils are equal, round, and reactive to light. Conjunctivae and EOM are normal               Nose: without d/c or deformity               Neck: Neck supple. Gross normal ROM               Cardiovascular: Normal rate and regular rhythm.                 Pulmonary/Chest: Effort normal and breath sounds without rales or wheezing.                Abd:  Soft, NT, ND, + BS, no organomegaly               Neurological: Pt is alert. At baseline  orientation, motor grossly intact               Skin: Skin is  warm. No rashes, no other new lesions, LE edema - none               Psychiatric: Pt behavior is normal without agitation   Micro: none  Cardiac tracings I have personally interpreted today:  none  Pertinent Radiological findings (summarize): none   Lab Results  Component Value Date   WBC 10.9 (H) 04/15/2022   HGB 13.1 04/15/2022   HCT 39.4 04/15/2022   PLT 241.0 04/15/2022   GLUCOSE 96 04/15/2022   CHOL 225 (H) 04/15/2022   TRIG 189.0 (H) 04/15/2022   HDL 76.00 04/15/2022   LDLDIRECT 117.7 09/26/2013   LDLCALC 111 (H) 04/15/2022   ALT 29 04/15/2022   AST 23 04/15/2022   NA 140 04/15/2022   K 5.0 04/15/2022   CL 105 04/15/2022   CREATININE 1.45 (H) 04/15/2022   BUN 24 (H) 04/15/2022   CO2 26 04/15/2022   TSH 3.25 04/15/2022   INR 1.03 10/22/2009   HGBA1C 5.8 04/15/2022   Assessment/Plan:  Carolyn Wells is a 81 y.o. White or Caucasian [1] female with  has a past medical history of ABDOMINAL INCISIONAL HERNIA (08/13/2009), Allergic rhinitis, cause unspecified (09/28/2011), BRADYCARDIA (09/26/2010), CKD (chronic kidney disease), stage III ([place]) (02/26/2015), COLONIC POLYPS, HX OF (62/22/9798), Complication of anesthesia, DIVERTICULOSIS, COLON (08/25/2007), Dysrhythmia, GASTROESOPHAGEAL REFLUX DISEASE (08/04/2007), HYPERLIPIDEMIA (09/27/2010), HYPERTENSION (08/06/2007), Hypothyroidism, INTERNAL HEMORRHOIDS (08/04/2007), LOW BACK PAIN (08/06/2007), OSTEOPENIA (08/04/2007), PAF (paroxysmal atrial fibrillation) ([place]), PONV (postoperative nausea and vomiting), and Solitary kidney, acquired (09/28/2011).  Encounter for well adult exam with abnormal findings Age and sex appropriate education and counseling updated with regular exercise and diet Referrals for preventative services - none needed Immunizations addressed - none needed Smoking counseling  - none needed Evidence for depression or other mood disorder - none significant Most recent labs reviewed. I have personally reviewed and  have noted: 1) the patient's medical and social history 2) The patient's current medications and supplements 3) The patient's height, weight, and BMI have been recorded in the chart   Hypothyroidism Lab Results  Component Value Date   TSH 3.25 04/15/2022   Stable, pt to continue levothyroxine 50 mcg qd   Hyperlipidemia Lab Results  Component Value Date   LDLCALC 111 (H) 04/15/2022   Uncontrolled, goal ldl < 100 pt to continue current low chol diet, declines statin   Essential hypertension BP Readings from Last 3 Encounters:  04/15/22 122/68  12/05/21 138/80  10/15/21 124/64   Stable, pt to continue medical treatment norvasc 2.5 qd, toprol xl 75 qd   CKD (chronic kidney disease), stage III ([facility]) Lab Results  Component Value Date   CREATININE 1.45 (H) 04/15/2022   Stable overall, cont to avoid nephrotoxins   Hemorrhoids Persistent worsening symptoms - for  GI referral  Followup: Return in about 1 year (around 04/16/2023).  Cathlean Cower, MD 04/16/2022 9:53 PM [facility]

## 2022-04-15 NOTE — Patient Instructions (Signed)
You will be contacted regarding the referral for: Dr Carlean Purl - Gastroenterology  Please continue all other medications as before, and refills have been done if requested.  Please have the pharmacy call with any other refills you may need.  Please continue your efforts at being more active, low cholesterol diet, and weight control.  You are otherwise up to date with prevention measures today.  Please keep your appointments with your specialists as you may have planned - kidney doctor and Dr Posey Pronto orthopedic  Please go to the LAB at the blood drawing area for the tests to be done  You will be contacted by phone if any changes need to be made immediately.  Otherwise, you will receive a letter about your results with an explanation, but please check with MyChart first.  Please remember to sign up for MyChart if you have not done so, as this will be important to you in the future with finding out test results, communicating by private email, and scheduling acute appointments online when needed.  Please make an Appointment to return for your 1 year visit, or sooner if needed

## 2022-04-16 ENCOUNTER — Encounter: Payer: Self-pay | Admitting: Internal Medicine

## 2022-04-16 DIAGNOSIS — K649 Unspecified hemorrhoids: Secondary | ICD-10-CM | POA: Insufficient documentation

## 2022-04-16 NOTE — Assessment & Plan Note (Signed)
Persistent worsening symptoms - for  GI referral

## 2022-04-16 NOTE — Assessment & Plan Note (Signed)
Lab Results  Component Value Date   LDLCALC 111 (H) 04/15/2022   Uncontrolled, goal ldl < 100 pt to continue current low chol diet, declines statin

## 2022-04-16 NOTE — Assessment & Plan Note (Signed)
BP Readings from Last 3 Encounters:  04/15/22 122/68  12/05/21 138/80  10/15/21 124/64   Stable, pt to continue medical treatment norvasc 2.5 qd, toprol xl 75 qd

## 2022-04-16 NOTE — Assessment & Plan Note (Signed)

## 2022-04-16 NOTE — Assessment & Plan Note (Signed)
Lab Results  Component Value Date   CREATININE 1.45 (H) 04/15/2022   Stable overall, cont to avoid nephrotoxins

## 2022-04-16 NOTE — Assessment & Plan Note (Signed)
Lab Results  Component Value Date   TSH 3.25 04/15/2022   Stable, pt to continue levothyroxine 50 mcg qd

## 2022-04-18 IMAGING — XA DG MYELOGRAPHY LUMBAR INJ LUMBOSACRAL
6 of 18 series · 6 of 18 positions shown · non-contrast
Comparison: Lumbar spine MRI 12/26/2020

CLINICAL DATA: Lumbar spinal stenosis with neurogenic claudication
pain in the low back, buttocks, and hips, equal between right and
left sides.
TECHNIQUE: Contiguous axial images were obtained through the Lumbar spine after
the intrathecal infusion of contrast. Coronal and sagittal
reconstructions were obtained of the axial image sets.

[Series 1: w lumbar spine lat · 0.15mm/px · 1 of 1 slices shown]
[im 1/1]
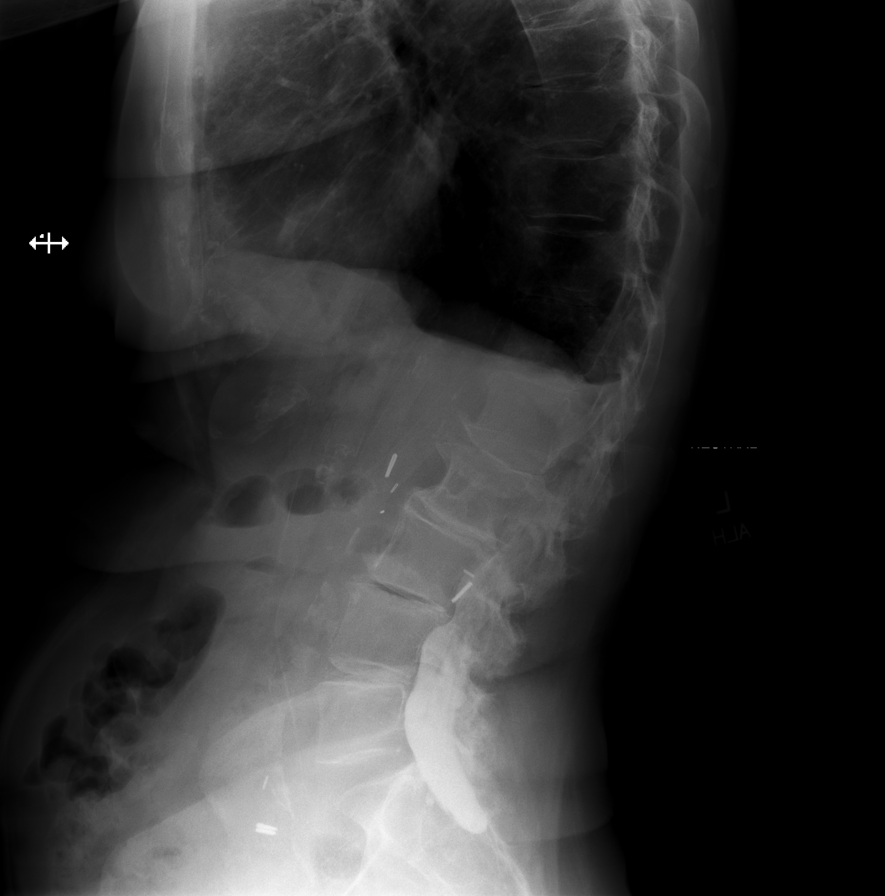

[Series 1: vasc adipose · 1 of 1 slices shown (1 of 3)]
[im 1/1]
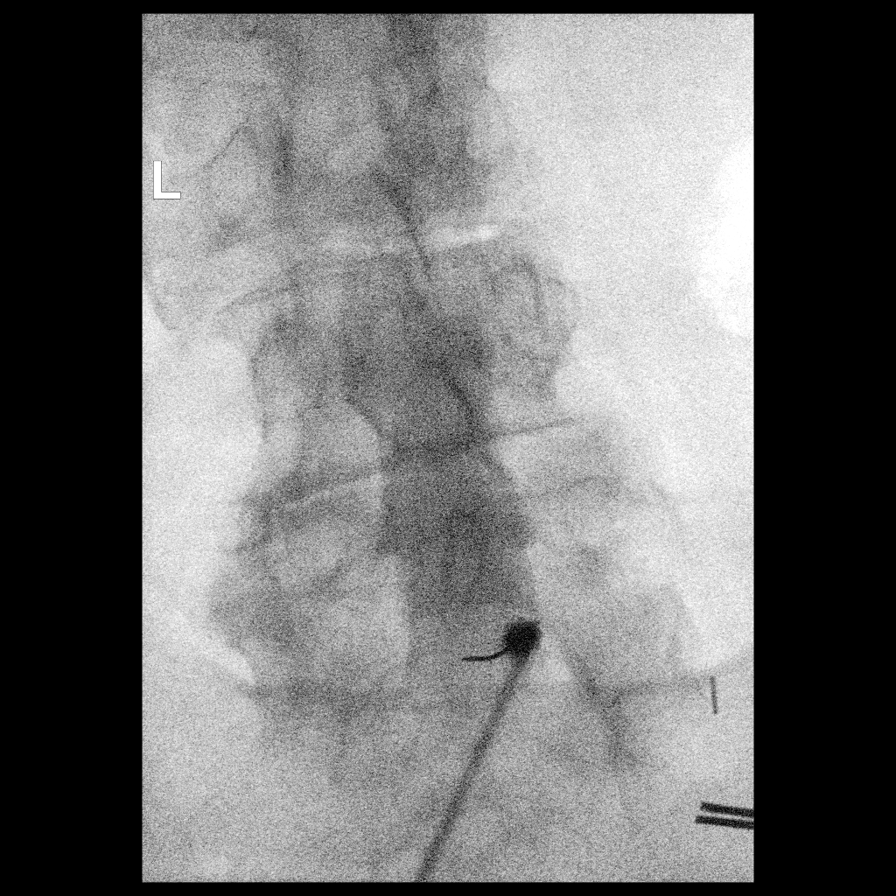

[Series 2: vasc adipose · 1 of 1 slices shown (2 of 3)]
[im 1/1]
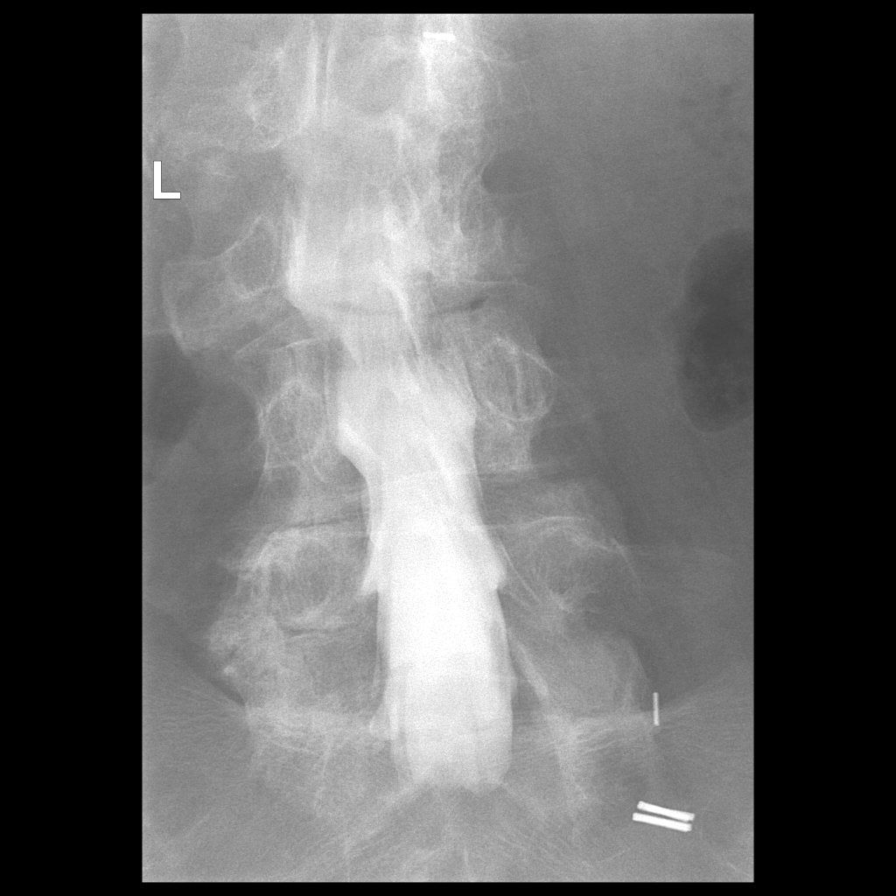

[Series 2: w lumbar spine flexion · 0.15mm/px · 1 of 1 slices shown]
[im 1/1]
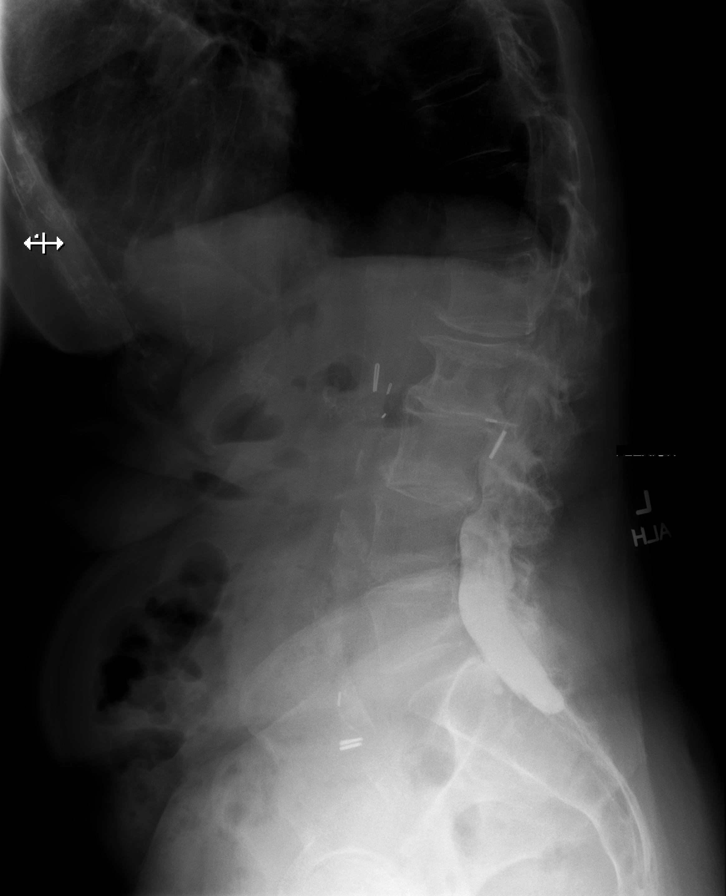

[Series 3: vasc adipose · 1 of 1 slices shown (3 of 3)]
[im 1/1]
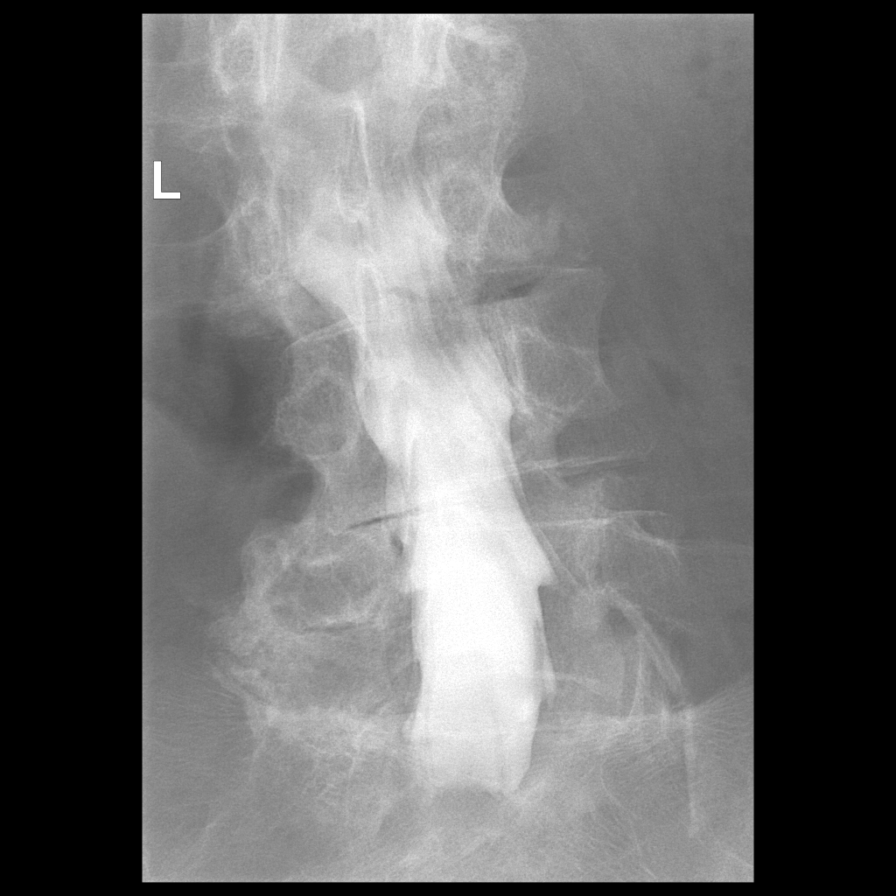

[Series 3: w lumbar spine extension · 0.15mm/px · 1 of 1 slices shown]
[im 1/1]
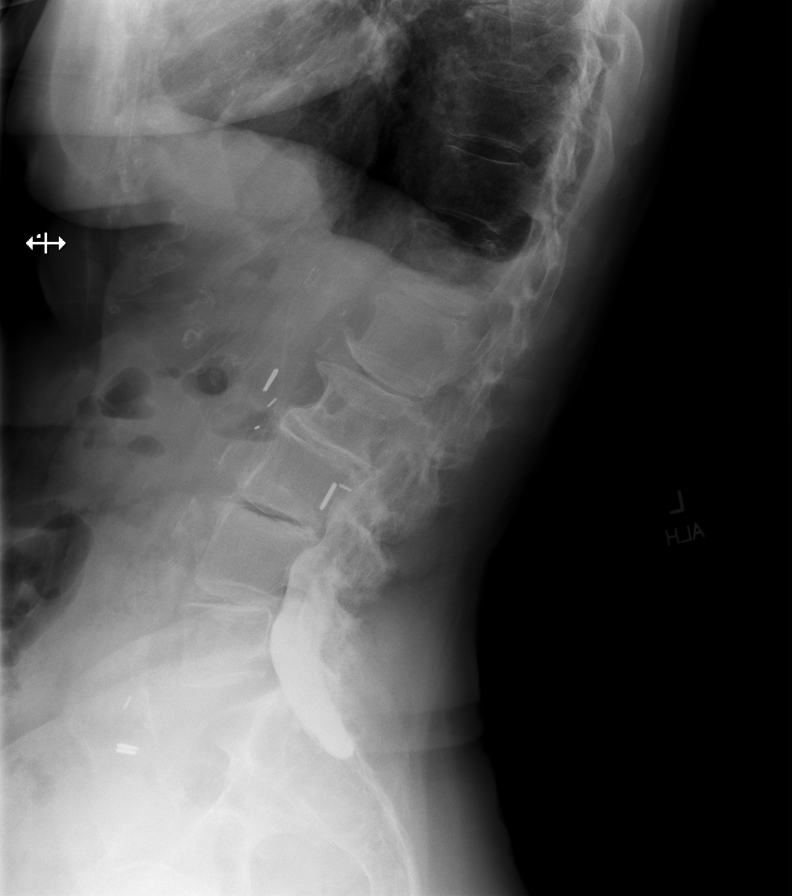

[6 of 18 positions shown; findings below may reference images not displayed]

EXAM:
LUMBAR MYELOGRAM

FLUOROSCOPY TIME:  Fluoroscopy Time: 55 seconds

Radiation Exposure Index: 198.38 microGray*m^2

PROCEDURE:
After thorough discussion of risks and benefits of the procedure
including bleeding, infection, injury to nerves, blood vessels,
adjacent structures as well as headache and CSF leak, written and
oral informed consent was obtained. Consent was obtained by Dr.
Rudi Jumper. Time out form was completed.

Patient was positioned prone on the fluoroscopy table. Local
anesthesia was provided with 1% lidocaine without epinephrine after
prepped and draped in the usual sterile fashion. Puncture was
performed at L5-S1 using a 3 1/2 inch 22-gauge spinal needle via a
right interlaminar approach. Using a single pass through the dura,
the needle was placed within the thecal sac, with return of clear
CSF. 15 mL of Isovue Z-HII was injected into the thecal sac, with
normal opacification of the nerve roots and cauda equina consistent
with free flow within the subarachnoid space.

I personally performed the lumbar puncture and administered the
intrathecal contrast. I also personally supervised acquisition of
the myelogram images.
FINDINGS: LUMBAR MYELOGRAM FINDINGS:

There are 5 non rib-bearing lumbar type vertebrae. Minimal
retrolisthesis of L1 on L2, L2 on L3, L3 on L4, and L4 on L5 and
minimal anterolisthesis of L5 on S1 do not significantly change with
flexion or extension. There are small ventral extradural defects at
L1-2, L2-3, and L3-4 with evidence of asymmetric right lateral
recess narrowing but no high-grade spinal stenosis.

CT LUMBAR MYELOGRAM FINDINGS:

There is severe levoscoliosis with apex L2. Trace retrolisthesis
from L1-2 to L4-5 and trace anterolisthesis at L5-S1 are unchanged
from the prior MRI. No acute fracture or suspicious osseous lesion
is identified. There is asymmetrically severe disc space narrowing
on the right at L2-3 and L3-4 and on the left at L4-5. A bulky,
solid bridging right lateral vertebral osteophyte is noted at L2-3.
The conus medullaris terminates at L1. Aortic atherosclerosis and
right nephrectomy are noted.

T12-L1: A small central disc extrusion has mildly enlarged and
demonstrates mild, predominantly cranial migration. The extrusion
contacts and slightly flattens the ventral spinal cord without
significant generalized spinal stenosis. Mild facet arthrosis.
Patent neural foramina.

L1-2: Retrolisthesis with bulging uncovered disc, a shallow right
paracentral to subarticular disc protrusion, and moderate facet and
ligamentum flavum hypertrophy result in mild to moderate right
lateral recess stenosis and mild right neural foraminal stenosis
without spinal stenosis, unchanged.

L2-3: Right eccentric disc bulging, endplate spurring, and moderate
facet and ligamentum flavum hypertrophy result in mild right lateral
recess stenosis and borderline to mild right neural foraminal
stenosis without spinal stenosis, unchanged.

L3-4: Right eccentric disc bulging, endplate spurring, severe
right-sided disc space height loss, and severe facet hypertrophy
result in mild spinal stenosis, moderate right and mild left lateral
recess stenosis, and moderate right neural foraminal stenosis,
unchanged. Potential right L3 and L4 nerve root impingement.

L4-5: Prior left laminectomy. Mild disc bulging, endplate spurring,
severe left-sided disc space height loss, and moderate right and
severe left facet hypertrophy result in borderline to mild right and
moderate left neural foraminal stenosis without spinal stenosis,
unchanged.

L5-S1: Minimal disc bulging and markedly severe left facet arthrosis
without stenosis, unchanged.
IMPRESSION: 1. Severe lumbar levoscoliosis with advanced multilevel disc and
facet degeneration.
2. Mild enlargement of a central disc extrusion at T12-L1 with
slight cord flattening but no significant stenosis.
3. Unchanged mild-to-moderate lateral recess and neural foraminal
stenosis elsewhere as above.
4. Aortic Atherosclerosis ([id number]).

## 2022-06-01 ENCOUNTER — Encounter: Payer: Self-pay | Admitting: Podiatry

## 2022-06-01 ENCOUNTER — Ambulatory Visit (INDEPENDENT_AMBULATORY_CARE_PROVIDER_SITE_OTHER): Payer: Medicare HMO | Admitting: Podiatry

## 2022-06-01 ENCOUNTER — Ambulatory Visit (INDEPENDENT_AMBULATORY_CARE_PROVIDER_SITE_OTHER): Payer: Medicare HMO

## 2022-06-01 DIAGNOSIS — M778 Other enthesopathies, not elsewhere classified: Secondary | ICD-10-CM

## 2022-06-01 DIAGNOSIS — I872 Venous insufficiency (chronic) (peripheral): Secondary | ICD-10-CM

## 2022-06-01 NOTE — Progress Notes (Signed)
Subjective:  Patient ID: Carolyn Wells, female    DOB: 04-Jul-1941,  MRN: [id number] HPI Chief Complaint  Patient presents with   Foot Pain    Dorsal midfoot and lateral ankle right - lots of swelling and aching x 3-4 weeks - per daughter: thinks her issue is more circulatory, no injuries to feet, heart doctor states she needs to drink more water, daughter has encouraged her to elevate and ice and drink more water, but doesn't seem to get through to her mom    81 y.o. female presents with the above complaint.   ROS: Denies fever chills nausea vomiting muscle aches pains calf pain back pain chest pain shortness of breath.  Past Medical History:  Diagnosis Date   ABDOMINAL INCISIONAL HERNIA 08/13/2009   s/p repair   Allergic rhinitis, cause unspecified 09/28/2011   BRADYCARDIA 09/26/2010   CKD (chronic kidney disease), stage III ([place]) 02/26/2015   Solitary kidney   COLONIC POLYPS, HX OF 52/77/8242   Complication of anesthesia    DIVERTICULOSIS, COLON 08/25/2007   Dysrhythmia    GASTROESOPHAGEAL REFLUX DISEASE 08/04/2007   HYPERLIPIDEMIA 09/27/2010   not on Rx therapy   HYPERTENSION 08/06/2007   Hypothyroidism    INTERNAL HEMORRHOIDS 08/04/2007   LOW BACK PAIN 08/06/2007   scoliosis; DJD; gets injections   OSTEOPENIA 08/04/2007   PAF (paroxysmal atrial fibrillation) ([place])    a.  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   Past Surgical History:  Procedure Laterality Date   LAMINECTOMY  1999   L4-5/lumbar   NEPHRECTOMY     Right   s/o incisional hernia repair     RUQ   STRABISMUS SURGERY Bilateral 04/14/2019   Procedure: STRABISMUS REPAIR BILATERAL;  Surgeon: Everitt Amber, MD;  Location: [facility];  Service: Ophthalmology;  Laterality: Bilateral;   TONSILLECTOMY      Current  Outpatient Medications:    amLODipine (NORVASC) 2.5 MG tablet, Take 1 tablet by mouth once daily, Disp: 90 tablet, Rfl: 3   Ascorbic Acid (VITAMIN C) 1000 MG tablet, Take 1,000 mg by mouth daily., Disp: , Rfl:    levothyroxine (SYNTHROID) 50 MCG tablet, TAKE 1 TABLET BY MOUTH ONCE DAILY BEFORE BREAKFAST, Disp: 90 tablet, Rfl: 3   LUMIGAN 0.01 % SOLN, Place 1 drop into both eyes at bedtime. , Disp: , Rfl:    metoprolol succinate (TOPROL XL) 25 MG 24 hr tablet, Take 3 tablets (75 mg total) by mouth daily., Disp: 270 tablet, Rfl: 3   Multiple Vitamins-Minerals (PRESERVISION AREDS 2 PO), Take 1 tablet by mouth 2 (two) times daily., Disp: , Rfl:    Multiple Vitamins-Minerals (WOMENS 50+ MULTI VITAMIN/MIN PO), Take 1 tablet by mouth daily., Disp: , Rfl:    pantoprazole (PROTONIX) 40 MG tablet, Take 1 tablet by mouth once daily, Disp: 90 tablet, Rfl: 3   Rivaroxaban (XARELTO) 15 MG TABS tablet, TAKE 1 TABLET BY MOUTH ONCE DAILY WITH SUPPER, Disp: 90 tablet, Rfl: 1   VITAMIN K PO, Take 100 mcg by mouth daily., Disp: , Rfl:   Allergies  Allergen Reactions   Tramadol Other (See Comments)    Loopy feeling   Hydrocodone Nausea Only and Other (See Comments)    lethargy   Ipratropium     Causes nasal bleeding   Review  of Systems Objective:  There were no vitals filed for this visit.  General: Well developed, nourished, in no acute distress, alert and oriented x3   Dermatological: Skin is warm, dry and supple bilateral. Nails x 10 are well maintained; remaining integument appears unremarkable at this time. There are no open sores, no preulcerative lesions, no rash or signs of infection present.  Vascular: Dorsalis Pedis artery and Posterior Tibial artery pedal pulses are 2/4 bilateral with immedate capillary fill time. Pedal hair growth present. No varicosities and   She does have pitting edema bilateral.  Right greater than left.  Neruologic: Grossly intact via light touch bilateral. Vibratory  intact via tuning fork bilateral. Protective threshold with Semmes Wienstein monofilament intact to all pedal sites bilateral. Patellar and Achilles deep tendon reflexes 2+ bilateral. No Babinski or clonus noted bilateral.   Musculoskeletal: No gross boney pedal deformities bilateral. No pain, crepitus, or limitation noted with foot and ankle range of motion bilateral. Muscular strength 5/5 in all groups tested bilateral.  Gait: Unassisted, Nonantalgic.    Radiographs:  Radiographs taken today demonstrate osseously mature individual mild osteopenia is noted.  She does have some swelling to the right lower extremity.  She also has some early osteoarthritic changes of the tarsometatarsal joints.  Assessment & Plan:   Assessment: Venous insufficiency right lower extremity significant edema right lower extremity  Plan: At this point we are requesting evaluation for vascular for venous insufficiency studies.     Young Mulvey T. [place], [place]

## 2022-06-08 ENCOUNTER — Ambulatory Visit (HOSPITAL_COMMUNITY)
Admission: RE | Admit: 2022-06-08 | Discharge: 2022-06-08 | Disposition: A | Discharge status: Home / Self Care | Payer: Medicare HMO | Source: Ambulatory Visit | Attending: Cardiology | Admitting: Cardiology

## 2022-06-08 DIAGNOSIS — I872 Venous insufficiency (chronic) (peripheral): Secondary | ICD-10-CM | POA: Diagnosis present

## 2022-06-09 ENCOUNTER — Telehealth: Payer: Self-pay | Admitting: *Deleted

## 2022-06-09 DIAGNOSIS — I872 Venous insufficiency (chronic) (peripheral): Secondary | ICD-10-CM

## 2022-06-09 NOTE — Telephone Encounter (Signed)
Referral for vascular consult placed today

## 2022-06-09 NOTE — Telephone Encounter (Signed)
-----   Message from Garrel Ridgel, [place] sent at 06/09/2022  7:11 AM EDT ----- The test is positive for venous reflux and recommends that she see the vascular doctor.  Could you please make sure she is scheduled for this?

## 2022-06-12 ENCOUNTER — Telehealth: Payer: Self-pay | Admitting: *Deleted

## 2022-06-12 NOTE — Telephone Encounter (Signed)
Patient's daughter is calling for VAS test results.  Results given, and that Dr Milinda Pointer is referring her to see a vascular surgeon, their office will contact to schedule.

## 2022-06-18 ENCOUNTER — Telehealth: Payer: Self-pay

## 2022-06-18 NOTE — Telephone Encounter (Signed)
Encounter created in error

## 2022-07-22 NOTE — Progress Notes (Signed)
[facility]           OF   History and Physical   Carolyn Wells is a 81 y.o. female who presents with RLE swelling.  She was seen by podiatry, Dr. Milinda Pointer in July and was having swelling and aching in the right leg for about 3-4 weeks at that time.  She did not have any injuries to her leg/foot and was referred for further evaluation.     She comes in today with her daughter.  Pt tells me that she started having swelling in the right leg about 2 months ago.  She has little swelling in the left leg but this is not bothersome to her.  She states she has some burning on the bottom of her feet that wakes her at night.  This improves with cutting down the air temperature, putting her foot outside the covers or putting it down on the cold tile.  She denies any claudication or rest pain.  She does not like to wear compression.  She does not elevate her legs higher than her heart.  She does have back issues and is currently on Prednisone for this.  She is followed by spine MD for this.  She denies any hx of DVT.  She does not have family hx of leg swelling or varicose veins.    She has hx of afib and on Xarelto.  She does have some chronic kidney issues and has hx of donating kidney to her husband in the past.   The pt is not on a statin for cholesterol management.  The pt is not on a daily aspirin.   Other AC:  Xarelto The pt is on CCB, BB for hypertension.   The pt is not diabetic.   Tobacco hx:  never  Pt does not have family hx of AAA.  Past Medical History:  Diagnosis Date   ABDOMINAL INCISIONAL HERNIA 08/13/2009   s/p repair   Allergic rhinitis, cause unspecified 09/28/2011   BRADYCARDIA 09/26/2010   CKD (chronic kidney disease), stage III ([place]) 02/26/2015   Solitary kidney   COLONIC POLYPS, HX OF 71/24/5809   Complication of anesthesia    DIVERTICULOSIS, COLON 08/25/2007   Dysrhythmia    GASTROESOPHAGEAL REFLUX DISEASE 08/04/2007    HYPERLIPIDEMIA 09/27/2010   not on Rx therapy   HYPERTENSION 08/06/2007   Hypothyroidism    INTERNAL HEMORRHOIDS 08/04/2007   LOW BACK PAIN 08/06/2007   scoliosis; DJD; gets injections   OSTEOPENIA 08/04/2007   PAF (paroxysmal atrial fibrillation) ([place])    a.  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    Past Surgical History:  Procedure Laterality Date   LAMINECTOMY  1999   L4-5/lumbar   NEPHRECTOMY     Right   s/o incisional hernia repair     RUQ   STRABISMUS SURGERY Bilateral 04/14/2019   Procedure: STRABISMUS REPAIR BILATERAL;  Surgeon: Everitt Amber, MD;  Location: [facility];  Service: Ophthalmology;  Laterality: Bilateral;   TONSILLECTOMY      Social History   Socioeconomic History   Marital status: Married    Spouse name: Not on file   Number of children: 2   Years of education: Not on file   Highest  education level: Not on file  Occupational History   Occupation: retired [employer]    Employer: RETIRED  Tobacco Use   Smoking status: Never   Smokeless tobacco: Never  Substance and Sexual Activity   Alcohol use: No    Alcohol/week: 0.0 standard drinks of alcohol   Drug use: No   Sexual activity: Not on file  Other Topics Concern   Not on file  Social History Narrative   Husband disabled, renal failure, DM, HTN.   Originally from [place], [place] >> came to [place] 1966 (husband in [employer])   Social Determinants of Health   Financial Resource Strain: Low Risk  (02/06/2022)   Overall Financial Resource Strain (CARDIA)    Difficulty of Paying Living Expenses: Not hard at all  Food Insecurity: No Food Insecurity (02/06/2022)   Hunger Vital Sign    Worried About Running Out of Food in the Last Year: Never true    Ran Out of Food in the Last Year: Never true   Transportation Needs: No Transportation Needs (02/06/2022)   PRAPARE - Hydrologist (Medical): No    Lack of Transportation (Non-Medical): No  Physical Activity: Sufficiently Active (02/06/2022)   Exercise Vital Sign    Days of Exercise per Week: 7 days    Minutes of Exercise per Session: 60 min  Stress: No Stress Concern Present (02/06/2022)   [place]    Feeling of Stress : Not at all  Social Connections: Moderately Integrated (02/06/2022)   Social Connection and Isolation Panel [NHANES]    Frequency of Communication with Friends and Family: More than three times a week    Frequency of Social Gatherings with Friends and Family: More than three times a week    Attends Religious Services: More than 4 times per year    Active Member of [employer] or Organizations: No    Attends Music therapist: More than 4 times per year    Marital Status: Widowed  Intimate Partner Violence: Not At Risk (02/06/2022)   Humiliation, Afraid, Rape, and Kick questionnaire    Fear of Current or Ex-Partner: No    Emotionally Abused: No    Physically Abused: No    Sexually Abused: No     Family History  Problem Relation Age of Onset   Coronary artery disease Mother 33       1st degree relative    Diabetes Mother    Heart attack Father 74   Diabetes Other        1st degree relative   Alzheimer's disease Brother     Current Outpatient Medications  Medication Sig Dispense Refill   amLODipine (NORVASC) 2.5 MG tablet Take 1 tablet by mouth once daily 90 tablet 3   Ascorbic Acid (VITAMIN C) 1000 MG tablet Take 1,000 mg by mouth daily.     levothyroxine (SYNTHROID) 50 MCG tablet TAKE 1 TABLET BY MOUTH ONCE DAILY BEFORE BREAKFAST 90 tablet 3   LUMIGAN 0.01 % SOLN Place 1 drop into both eyes at bedtime.      metoprolol succinate (TOPROL XL) 25 MG 24 hr tablet Take 3 tablets (75 mg total) by mouth daily.  270 tablet 3   Multiple Vitamins-Minerals (PRESERVISION AREDS 2 PO) Take 1 tablet by mouth 2 (two) times daily.     Multiple Vitamins-Minerals (WOMENS 50+ MULTI VITAMIN/MIN PO) Take 1 tablet by mouth daily.     pantoprazole (PROTONIX) 40  MG tablet Take 1 tablet by mouth once daily 90 tablet 3   Rivaroxaban (XARELTO) 15 MG TABS tablet TAKE 1 TABLET BY MOUTH ONCE DAILY WITH SUPPER 90 tablet 1   VITAMIN K PO Take 100 mcg by mouth daily.     No current facility-administered medications for this visit.    Allergies  Allergen Reactions   Tramadol Other (See Comments)    Loopy feeling   Hydrocodone Nausea Only and Other (See Comments)    lethargy   Ipratropium     Causes nasal bleeding    REVIEW OF SYSTEMS:   '[X]'$  denotes positive finding, '[ ]'$  denotes negative finding Cardiac  Comments:  Chest pain or chest pressure:    Shortness of breath upon exertion:    Short of breath when lying flat:    Irregular heart rhythm:        Vascular    Pain in calf, thigh, or hip brought on by ambulation:    Pain in feet at night that wakes you up from your sleep:     Blood clot in your veins:    Leg swelling:  x See HPI      Pulmonary    Oxygen at home:    Productive cough:     Wheezing:         Neurologic    Sudden weakness in arms or legs:     Sudden numbness in arms or legs:     Sudden onset of difficulty speaking or slurred speech:    Temporary loss of vision in one eye:     Problems with dizziness:         Gastrointestinal    Blood in stool:     Vomited blood:         Genitourinary    Burning when urinating:     Blood in urine:        Psychiatric    Major depression:         Hematologic    Bleeding problems:    Problems with blood clotting too easily:        Skin    Rashes or ulcers:        Constitutional    Fever or chills:      PHYSICAL EXAMINATION:  Today's Vitals   07/24/22 1013  BP: (!) 172/99  Pulse: 74  Resp: 16  Temp: (!) 97.3 F (36.3 C)  TempSrc:  Temporal  Weight: 154 lb (69.9 kg)  Height: 5' 5.5" (1.664 m)  PainSc: 10-Worst pain ever   Body mass index is 25.24 kg/m.   General:  WDWN in NAD; vital signs documented above Gait: Not observed HENT: WNL, normocephalic Pulmonary: normal non-labored breathing without wheezing Cardiac: regular HR; without carotid bruits Abdomen: soft, NT, aortic pulse is not palpable Skin: without rashes Vascular Exam/Pulses:  Right Left  Radial 2+ (normal) 2+ (normal)  DP 2+ (normal) Brisk doppler flow 1+ (weak) Brisk doppler flow  PT Brisk doppler flow Brisk doppler flow   Extremities: RLE swelling greater than left.  No ischemic changes present  Neurologic: A&O X 3;  moving all extremities equally Psychiatric:  The pt has Normal affect.   Non-Invasive Vascular Imaging:   Venous duplex on 06/08/2022: Venous Reflux Times  +---------------------------+---------+------+-----------+------------+----  RIGHT                      Reflux NoRefluxReflux TimeDiameter cmsComments  Yes                               +---------------------------+---------+------+-----------+------------+----  CFV                        no                                          +---------------------------+---------+------+-----------+------------+----   FV prox                    no                                          +---------------------------+---------+------+-----------+------------+----  FV mid                     no                                          +---------------------------+---------+------+-----------+------------+----  FV dist                    no                                          +---------------------------+---------+------+-----------+------------+----  Popliteal                  no                                          +---------------------------+---------+------+-----------+------------+----  GSV  at [facility]                 no                            0.93          +---------------------------+---------+------+-----------+------------+----  GSV prox thigh             no                            0.63          +---------------------------+---------+------+-----------+------------+----  GSV mid thigh                        yes    >500 ms      0.54         +---------------------------+---------+------+-----------+------------+----  GSV dist thigh                       yes    >500 ms      0.50          +---------------------------+---------+------+-----------+------------+----  GSV at knee                          yes    >  500 ms      0.59          +---------------------------+---------+------+-----------+------------+----  GSV mid calf                         yes    >500 ms      0.36           +---------------------------+---------+------+-----------+------------+----  SSV prox calf                        yes    >500 ms      0.33          +---------------------------+---------+------+-----------+------------+----  Anterior acc. saphenous              yes    >500 ms      0.40         vein                                                                   +---------------------------+---------+------+-----------+------------+----   Summary:  Right:  - No evidence of deep vein thrombosis seen in the right lower extremity, from the common femoral through the popliteal veins.  - No evidence of superficial venous thrombosis in the right lower extremity.  - Venous reflux is noted in the right greater saphenous vein in the thigh.  - Venous reflux is noted in the right greater saphenous vein in the calf.  - Venous reflux is noted in the right short saphenous vein.     Recommendations: VASCULAR CONSULT IS INDICATED.    Carolyn Wells is a 81 y.o. female who presents with: RLE swelling    -pt has palpable pedal pulses and brisk doppler flow  in bilateral DP/PT -pt does not have evidence of DVT.  Pt does not have venous reflux in the deep venous system on the right.  She does have venous reflux in the GSV in the mid and distal thigh down to the calf and AASV.  She does not have reflux at the [facility].  She and her daughter understand that she may or may not be a candidate for laser ablation therapy but will have them return in 3 months to see is she has had any improvement in her sx with compression and elevation and discussion with MD.   -she may have some component of neuropathy given the burning sensation on the plantar aspect of both feet. I do not feel this is arterial in nature as she has palpable DP pulses and brisk doppler flow bilateral feet.   She does have hx of arthritis in her spine.  -discussed with pt about wearing thigh high 20-30 mmHg compression stockings and pt was measured for these today and did get a pair.  They live in [place] so they have access to Elastic therapy -discussed the importance of leg elevation and how to elevate properly - pt is advised to elevate their legs and a diagram is given to them to demonstrate for pt to lay flat on their back with knees elevated and slightly bent with their feet higher than their knees, which puts their feet higher than their heart for 15 minutes per day.  If pt cannot lay  flat, advised to lay as flat as possible.  -pt is advised to continue as much walking as possible and avoid sitting or standing for long periods of time.  -discussed importance of weight loss and exercise and that water aerobics would also be beneficial.  -handout with recommendations given    Leontine Locket, [facility] [phone]  Clinic MD:  Virl Cagey

## 2022-07-24 ENCOUNTER — Encounter: Payer: Self-pay | Admitting: Physician Assistant

## 2022-07-24 ENCOUNTER — Ambulatory Visit (INDEPENDENT_AMBULATORY_CARE_PROVIDER_SITE_OTHER): Payer: Medicare HMO | Admitting: Physician Assistant

## 2022-07-24 VITALS — BP 172/99 | HR 74 | Temp 97.3°F | Resp 16 | Ht 65.5 in | Wt 154.0 lb

## 2022-07-24 DIAGNOSIS — M7989 Other specified soft tissue disorders: Secondary | ICD-10-CM | POA: Diagnosis not present

## 2022-08-26 ENCOUNTER — Other Ambulatory Visit: Payer: Self-pay | Admitting: Cardiovascular Disease

## 2022-08-26 NOTE — Telephone Encounter (Signed)
Prescription refill request for Xarelto received.  Indication: Afib  Last office visit: 12/05/21 ([place])  Weight: 69.9kg Age: 81 Scr: 1.45 (04/15/22) CrCl: 33.22m/min  Appropriate dose and rfill sent to requested pharmacy.

## 2022-09-11 ENCOUNTER — Encounter: Payer: Self-pay | Admitting: Internal Medicine

## 2022-09-11 ENCOUNTER — Ambulatory Visit (INDEPENDENT_AMBULATORY_CARE_PROVIDER_SITE_OTHER): Payer: Medicare HMO | Admitting: Internal Medicine

## 2022-09-11 VITALS — BP 144/90 | HR 80 | Ht 64.0 in | Wt 152.4 lb

## 2022-09-11 DIAGNOSIS — K648 Other hemorrhoids: Secondary | ICD-10-CM

## 2022-09-11 DIAGNOSIS — K625 Hemorrhage of anus and rectum: Secondary | ICD-10-CM

## 2022-09-11 MED ORDER — HYDROCORTISONE ACETATE 25 MG RE SUPP: 25.0000 mg | Freq: Every day | RECTAL | 2 refills | Status: DC

## 2022-09-11 MED ORDER — HYDROCORTISONE (PERIANAL) 2.5 % EX CREA: 1.0000 | TOPICAL_CREAM | Freq: Two times a day (BID) | CUTANEOUS | 1 refills | Status: DC

## 2022-09-11 NOTE — Progress Notes (Signed)
HISTORY OF PRESENT ILLNESS:  Carolyn Wells is a 81 y.o. female with past medical history as listed below.  She presents today for evaluation and treatment of symptomatic hemorrhoids.  Patient underwent colonoscopy in 2008.  She was found to have adenomatous and tubulovillous polyps as well as diverticulosis.  I last saw her in the office April 2018.  See that dictation.  She was not interested in surveillance colonoscopy as documented.  She has not been seen since.  Patient tells me that she has problems with hemorrhoids including protrusion and occasional minor bleeding on the tissue.  She is on chronic anticoagulation.  No significant pain.  She states she has 2 or 3 semiformed bowel movements daily.  Occasionally she strains.  No incontinence.  She has tried Preparation H for short periods of time without significant improvement.  She is looking for nonsurgical management of her hemorrhoids.  Next, she asked me about lateral wall hernia which she had repaired after donating her kidney to her husband.  REVIEW OF SYSTEMS:  All non-GI ROS negative unless otherwise stated in the HPI except for arthritis, back pain, ankle edema, shortness of breath, heart rhythm change  Past Medical History:  Diagnosis Date   ABDOMINAL INCISIONAL HERNIA 08/13/2009   s/p repair   Allergic rhinitis, cause unspecified 09/28/2011   BRADYCARDIA 09/26/2010   CKD (chronic kidney disease), stage III ([place]) 02/26/2015   Solitary kidney   COLONIC POLYPS, HX OF 68/10/7516   Complication of anesthesia    DIVERTICULOSIS, COLON 08/25/2007   Dysrhythmia    GASTROESOPHAGEAL REFLUX DISEASE 08/04/2007   HYPERLIPIDEMIA 09/27/2010   not on Rx therapy   HYPERTENSION 08/06/2007   Hypothyroidism    INTERNAL HEMORRHOIDS 08/04/2007   LOW BACK PAIN 08/06/2007   scoliosis; DJD; gets injections   OSTEOPENIA 08/04/2007   PAF (paroxysmal atrial fibrillation) ([place])    a.  Echo 2/17:  Vigorous LVF, EF 65-70%, no RWMA, Ao sclerosis,  trivial AI, trivial MR, trivial TR, PASP 23 mmHg   PONV (postoperative nausea and vomiting)    Solitary kidney, acquired 09/28/2011   donated R kidney to husband    Past Surgical History:  Procedure Laterality Date   LAMINECTOMY  1999   L4-5/lumbar   NEPHRECTOMY     Right   s/o incisional hernia repair     RUQ   STRABISMUS SURGERY Bilateral 04/14/2019   Procedure: STRABISMUS REPAIR BILATERAL;  Surgeon: Everitt Amber, MD;  Location: [facility];  Service: Ophthalmology;  Laterality: Bilateral;   TONSILLECTOMY      Social History Carolyn Wells  reports that she has never smoked. She has never used smokeless tobacco. She reports that she does not drink alcohol and does not use drugs.  family history includes Alzheimer's disease in her brother; Coronary artery disease (age of onset: 9) in her mother; Diabetes in her mother and another family member; Heart attack (age of onset: 73) in her father.  Allergies  Allergen Reactions   Tramadol Other (See Comments)    Loopy feeling   Hydrocodone Nausea Only and Other (See Comments)    lethargy   Ipratropium     Causes nasal bleeding       PHYSICAL EXAMINATION: Vital signs: BP (!) 144/90 (BP Location: Left Arm, Patient Position: Sitting, Cuff Size: Normal)   Pulse 80 Comment: irregular  Ht '5\' 4"'$  (1.626 m) Comment: height measured without shoes  Wt 152 lb 6 oz (69.1 kg)   BMI 26.16 kg/m  Constitutional: generally well-appearing, no acute distress Psychiatric: alert and oriented x3, cooperative Eyes: Anicteric Abdomen: soft, nontender, nondistended, no obvious ascites, no peritoneal signs, normal bowel sounds, no organomegaly.  Previous right-sided incision healed.  No obvious hernia Rectal: Moderate internal hemorrhoids with tendency toward prolapse.  Otherwise normal Extremities: no clubbing, cyanosis, or lower extremity edema bilaterally Skin: no lesions on visible extremities Neuro: No focal deficits.   Cranial nerves intact  ASSESSMENT:  1.  Symptomatic internal hemorrhoids 2.  No evidence for recurrent right-sided hernia 3.  Multiple medical problems   PLAN:  1.  Prescribe Anusol HC suppositories.  1 per rectum at night for 30 days.  Multiple refills to be used on demand as needed 2.  Citrucel 2 tablespoons daily 3.  GI follow-up as needed

## 2022-09-11 NOTE — Patient Instructions (Signed)
_______________________________________________________  If you are age 81 or older, your body mass index should be between 23-30. Your Body mass index is 26.16 kg/m. If this is out of the aforementioned range listed, please consider follow up with your Primary Care Provider.  If you are age 63 or younger, your body mass index should be between 19-25. Your Body mass index is 26.16 kg/m. If this is out of the aformentioned range listed, please consider follow up with your Primary Care Provider.   ________________________________________________________  The [facility] providers would like to encourage you to use [facility] to communicate with providers for non-urgent requests or questions.  Due to long hold times on the telephone, sending your provider a message by [facility] may be a faster and more efficient way to get a response.  Please allow 48 business hours for a response.  Please remember that this is for non-urgent requests.  _______________________________________________________  We have sent the following medications to your pharmacy for you to pick up at your convenience:  Anusol HC cream  Purchase Preparation H suppositories over the counter.  Place a small amount of the cream on the suppositories and insert rectally at night.  Take 2 tablespoons of Citrucel daily.  Please follow up as needed

## 2022-10-21 ENCOUNTER — Ambulatory Visit (INDEPENDENT_AMBULATORY_CARE_PROVIDER_SITE_OTHER): Payer: Medicare HMO | Admitting: Vascular Surgery

## 2022-10-21 ENCOUNTER — Encounter: Payer: Self-pay | Admitting: Vascular Surgery

## 2022-10-21 VITALS — BP 162/93 | HR 89 | Temp 97.8°F | Resp 16 | Ht 66.0 in | Wt 152.0 lb

## 2022-10-21 DIAGNOSIS — M7989 Other specified soft tissue disorders: Secondary | ICD-10-CM

## 2022-10-21 NOTE — Progress Notes (Signed)
REASON FOR VISIT:   33-monthfollow-up visit.  MEDICAL ISSUES:   CHRONIC VENOUS INSUFFICIENCY: This patient has C3 venous disease (leg swelling).  By my SonoSite the patient has significant reflux from the right saphenofemoral junction down to the proximal calf.  Diameters of the vein ranged from 5-6 mm.  She also has reflux in the right anterior accessory saphenous vein.  She has no deep venous reflux.  We have again discussed importance of intermittent leg elevation and the proper positioning for this.  She does think that her thigh-high compression stockings helped significantly and we have refitted her for another pair.  I have encouraged her to avoid prolonged sitting and standing.  We discussed importance of exercise possibly walking and water aerobics.  I think water aerobics especially will be helpful given her back issues.  Fortunately she has a healthy weight.  If her swelling progresses or she develops worsening symptoms of venous hypertension I think she would be a good candidate for laser ablation of the right great saphenous vein and also staged ablation of the right anterior accessory saphenous vein.  She will call if her symptoms or swelling progress.   HPI:   Carolyn CREEDONis a pleasant 81y.o. female who was seen by SLiana Crocker PA on 07/24/2022 with right lower extremity swelling.  Since she was seen here last, she states that she has been wearing her thigh-high compression stockings and this has helped her swelling significantly.  She does describe some aching pain in her legs aggravated by standing and relieved with elevation.  Her activity is somewhat limited by her back issues.  She has had no previous history of DVT.  I do not get any history of claudication.  She does describe that her feet get warm at night and also she has some pain on the plantar aspect of her feet.  She did have frostbite when she is very young and [place] 81  She may have some underlying neuropathy.  Past Medical History:  Diagnosis Date   ABDOMINAL INCISIONAL HERNIA 08/13/2009   s/p repair   Allergic rhinitis, cause unspecified 09/28/2011   BRADYCARDIA 09/26/2010   CKD (chronic kidney disease), stage III ([place] 02/26/2015   Solitary kidney   COLONIC POLYPS, HX OF 170/35/0093  Complication of anesthesia    DIVERTICULOSIS, COLON 08/25/2007   Dysrhythmia    GASTROESOPHAGEAL REFLUX DISEASE 08/04/2007   HYPERLIPIDEMIA 09/27/2010   not on Rx therapy   HYPERTENSION 08/06/2007   Hypothyroidism    INTERNAL HEMORRHOIDS 08/04/2007   LOW BACK PAIN 08/06/2007   scoliosis; DJD; gets injections   OSTEOPENIA 08/04/2007   PAF (paroxysmal atrial fibrillation) ([place]    a.  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    Family History  Problem Relation Age of Onset   Coronary artery disease Mother 653      1st degree relative    Diabetes Mother    Heart attack Father 673  Diabetes Other        1st degree relative   Alzheimer's disease Brother     SOCIAL HISTORY: Social History   Tobacco Use   Smoking status: Never   Smokeless tobacco: Never  Substance Use Topics  Alcohol use: No    Alcohol/week: 0.0 standard drinks of alcohol    Allergies  Allergen Reactions   Tramadol Other (See Comments)    Loopy feeling   Hydrocodone Nausea Only and Other (See Comments)    lethargy   Ipratropium     Causes nasal bleeding    Current Outpatient Medications  Medication Sig Dispense Refill   amLODipine (NORVASC) 2.5 MG tablet Take 1 tablet by mouth once daily 90 tablet 3   Ascorbic Acid (VITAMIN C) 1000 MG tablet Take 1,000 mg by mouth daily.     hydrocortisone (ANUSOL-HC) 2.5 % rectal cream Place 1 Application rectally 2 (two) times daily. 30 g 1   hydrocortisone  (ANUSOL-HC) 25 MG suppository Place 1 suppository (25 mg total) rectally at bedtime. 24 suppository 2   levothyroxine (SYNTHROID) 50 MCG tablet TAKE 1 TABLET BY MOUTH ONCE DAILY BEFORE BREAKFAST 90 tablet 3   LUMIGAN 0.01 % SOLN Place 1 drop into both eyes at bedtime.      metoprolol succinate (TOPROL XL) 25 MG 24 hr tablet Take 3 tablets (75 mg total) by mouth daily. 270 tablet 3   Multiple Vitamins-Minerals (PRESERVISION AREDS 2 PO) Take 1 tablet by mouth 2 (two) times daily.     Multiple Vitamins-Minerals (WOMENS 50+ MULTI VITAMIN/MIN PO) Take 1 tablet by mouth daily.     pantoprazole (PROTONIX) 40 MG tablet Take 1 tablet by mouth once daily 90 tablet 3   Rivaroxaban (XARELTO) 15 MG TABS tablet TAKE 1 TABLET BY MOUTH ONCE DAILY WITH SUPPER 90 tablet 1   VITAMIN K PO Take 100 mcg by mouth daily.     No current facility-administered medications for this visit.    REVIEW OF SYSTEMS:  '[X]'$  denotes positive finding, '[ ]'$  denotes negative finding Cardiac  Comments:  Chest pain or chest pressure:    Shortness of breath upon exertion:    Short of breath when lying flat:    Irregular heart rhythm:        Vascular    Pain in calf, thigh, or hip brought on by ambulation:    Pain in feet at night that wakes you up from your sleep:     Blood clot in your veins:    Leg swelling:  x       Pulmonary    Oxygen at home:    Productive cough:     Wheezing:         Neurologic    Sudden weakness in arms or legs:     Sudden numbness in arms or legs:     Sudden onset of difficulty speaking or slurred speech:    Temporary loss of vision in one eye:     Problems with dizziness:         Gastrointestinal    Blood in stool:     Vomited blood:         Genitourinary    Burning when urinating:     Blood in urine:        Psychiatric    Major depression:         Hematologic    Bleeding problems:    Problems with blood clotting too easily:        Skin    Rashes or ulcers:        Constitutional     Fever or chills:     PHYSICAL EXAM:   Vitals:   10/21/22 1407  BP: (!) 162/93  Pulse: 89  Resp: 16  Temp: 97.8 F (36.6 C)  TempSrc: Temporal  SpO2: 95%  Weight: 152 lb (68.9 kg)  Height: '5\' 6"'$  (1.676 m)    GENERAL: The patient is a well-nourished female, in no acute distress. The vital signs are documented above. CARDIAC: There is a regular rate and rhythm.  VASCULAR: I do not detect carotid bruits. She has palpable pedal pulses and biphasic dorsalis pedis and posterior tibial signals bilaterally. She has mild bilateral lower extremity swelling. Of note, Sonya Rankin, RN and myself both looked at the great saphenous vein on the right myself with the SonoSite and clearly there is reflux in the saphenofemoral femoral junction on the right and in the proximal saphenous vein.  There is reflux demonstrated all the way down to the proximal calf.  Not sure why the previous study in July did not demonstrate reflux in the proximal vein as this is quite pronounced. PULMONARY: There is good air exchange bilaterally without wheezing or rales. ABDOMEN: Soft and non-tender with normal pitched bowel sounds.  MUSCULOSKELETAL: There are no major deformities or cyanosis. NEUROLOGIC: No focal weakness or paresthesias are detected. SKIN: There are no ulcers or rashes noted. PSYCHIATRIC: The patient has a normal affect.  DATA:    VENOUS DUPLEX: I have reviewed this patient's venous duplex scan that was done on 06/08/2022.  This was of the right lower extremity only.  There was no evidence of DVT.  There was no deep venous reflux.  There was superficial venous reflux in the right great saphenous vein from the mid thigh to the mid calf.  Diameters of the vein ranged from 3.6-5.9 mm.  There was no reflux in the saphenofemoral junction or proximal thigh.  There was a short segment of reflux in the right small saphenous vein although the vein was not dilated.  There was also reflux and a fairly small  anterior accessory saphenous vein which was dilated up to 4 mm.  The results of this test are summarized on the diagram below.    Deitra Mayo [facility] [phone]

## 2022-11-08 ENCOUNTER — Other Ambulatory Visit: Payer: Self-pay | Admitting: Internal Medicine

## 2022-11-09 NOTE — Telephone Encounter (Signed)
Please refill as per office routine med refill policy (all routine meds to be refilled for 3 mo or monthly (per pt preference) up to one year from last visit, then month to month grace period for 3 mo, then further med refills will have to be denied) ? ?

## 2022-11-23 ENCOUNTER — Other Ambulatory Visit: Payer: Self-pay | Admitting: Cardiovascular Disease

## 2022-11-23 ENCOUNTER — Other Ambulatory Visit: Payer: Self-pay | Admitting: Internal Medicine

## 2022-11-23 ENCOUNTER — Other Ambulatory Visit: Payer: Self-pay

## 2022-11-23 NOTE — Telephone Encounter (Signed)
Please refill as per office routine med refill policy (all routine meds to be refilled for 3 mo or monthly (per pt preference) up to one year from last visit, then month to month grace period for 3 mo, then further med refills will have to be denied)

## 2022-12-13 NOTE — Progress Notes (Unsigned)
Cardiology Office Note:    Date:  12/15/2022   ID:  Carolyn Wells, DOB 03-10-1941, MRN [id number]  PCP:  Biagio Borg, MD   [facility] Providers Cardiologist:  [name], MD     Referring MD: Biagio Borg, MD   Chief Complaint: follow-up atrial fibrillation  History of Present Illness:    Carolyn Wells is a very pleasant 82 y.o. female with a hx of HTN, hyperlipidemia, CKD stage III (donated kidney to her husband), atrial fibrillation on chronic anticoagulation, and hypothyroidism.   She established with cardiology 12/17/2015 after presenting for colonoscopy and noted to be in AF with RVR.  She was asymptomatic. She was started on anticoagulation and metoprolol for rate control. LDL cholesterol elevated but she did not wish to start statin therapy.  She has remained overall asymptomatic with her A-fib and has maintained consistent follow-up.  Last cardiology clinic visit was 12/05/2021 with Dr. Acie Fredrickson. She reported an episode of rapid AF after taking a tramadol 200 mg tablet and developing nausea and vomiting.  She took additional metoprolol and improved her hydration with resolution of symptoms.  No changes were made to treatment plan.  She was advised to return in 1 year for follow-up.  Today, she is here alone for follow-up. Reports she has been feeling well. Admits to increased sodium intake recently and BP has been higher. Feels her heart beat irregularly at times but this is not bothersome to her. Notes increased shortness of breath with activities such as taking out the trash cans.  No orthopnea, PND.  Had swelling in RLE, wore compression x 3 months and edema resolved. Will wear compression if she notices edema returning. She denies chest pain, fatigue, palpitations, melena, hematuria, hemoptysis, diaphoresis, weakness, presyncope, syncope. Physical activity is limited 2/2 severe back pain. Is concerned about herniation of abdomen where kidney was removed.    Past  Medical History:  Diagnosis Date   ABDOMINAL INCISIONAL HERNIA 08/13/2009   s/p repair   Allergic rhinitis, cause unspecified 09/28/2011   BRADYCARDIA 09/26/2010   CKD (chronic kidney disease), stage III ([place]) 02/26/2015   Solitary kidney   COLONIC POLYPS, HX OF 31/54/0086   Complication of anesthesia    DIVERTICULOSIS, COLON 08/25/2007   Dysrhythmia    GASTROESOPHAGEAL REFLUX DISEASE 08/04/2007   HYPERLIPIDEMIA 09/27/2010   not on Rx therapy   HYPERTENSION 08/06/2007   Hypothyroidism    INTERNAL HEMORRHOIDS 08/04/2007   LOW BACK PAIN 08/06/2007   scoliosis; DJD; gets injections   OSTEOPENIA 08/04/2007   PAF (paroxysmal atrial fibrillation) ([place])    a.  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    Past Surgical History:  Procedure Laterality Date   LAMINECTOMY  1999   L4-5/lumbar   NEPHRECTOMY     Right   s/o incisional hernia repair     RUQ   STRABISMUS SURGERY Bilateral 04/14/2019   Procedure: STRABISMUS REPAIR BILATERAL;  Surgeon: Everitt Amber, MD;  Location: [facility];  Service: Ophthalmology;  Laterality: Bilateral;   TONSILLECTOMY      Current Medications: Current Meds  Medication Sig   Ascorbic Acid (VITAMIN C) 1000 MG tablet Take 1,000 mg by mouth daily.   hydrocortisone (ANUSOL-HC) 2.5 % rectal cream Place 1 Application rectally 2 (two) times daily.  hydrocortisone (ANUSOL-HC) 25 MG suppository Place 1 suppository (25 mg total) rectally at bedtime.   levothyroxine (SYNTHROID) 50 MCG tablet TAKE 1 TABLET BY MOUTH ONCE DAILY BEFORE BREAKFAST   LUMIGAN 0.01 % SOLN Place 1 drop into both eyes at bedtime.    metoprolol succinate (TOPROL-XL) 25 MG 24 hr tablet Take 3 tablets (75 mg total) by mouth daily.   Multiple Vitamins-Minerals (PRESERVISION AREDS 2 PO) Take 1 tablet by  mouth 2 (two) times daily.   Multiple Vitamins-Minerals (WOMENS 50+ MULTI VITAMIN/MIN PO) Take 1 tablet by mouth daily.   pantoprazole (PROTONIX) 40 MG tablet Take 1 tablet by mouth once daily   Rivaroxaban (XARELTO) 15 MG TABS tablet TAKE 1 TABLET BY MOUTH ONCE DAILY WITH SUPPER   VITAMIN K PO Take 100 mcg by mouth daily.   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    Employer: RETIRED  Tobacco Use   Smoking status: Never   Smokeless tobacco: Never  Substance and Sexual Activity   Alcohol use: No    Alcohol/week: 0.0 standard drinks of alcohol   Drug use: No   Sexual activity: Not on file  Other Topics Concern   Not on file  Social History Narrative   Husband disabled, renal failure, DM, HTN.   Originally from [place], [place] >> came to [place] 1966 (husband in [employer])   Social Determinants of Health   Financial Resource Strain: Low Risk  (02/06/2022)   Overall Financial Resource Strain (CARDIA)    Difficulty of Paying Living Expenses: Not hard at all  Food Insecurity: No Food Insecurity (02/06/2022)   Hunger Vital Sign    Worried About Running Out of Food in the Last Year: Never true    Ran Out of Food in the Last Year: Never true  Transportation Needs: No Transportation Needs (02/06/2022)   PRAPARE - Hydrologist (Medical): No    Lack of Transportation (Non-Medical): No  Physical Activity: Sufficiently Active (02/06/2022)   Exercise Vital Sign    Days of Exercise per Week: 7 days    Minutes of Exercise per Session: 60 min  Stress: No Stress Concern Present (02/06/2022)   [place]    Feeling of Stress : Not at all  Social Connections: Moderately Integrated (02/06/2022)   Social Connection and Isolation Panel [NHANES]    Frequency of Communication with Friends and Family: More than three times a week    Frequency of Social Gatherings with Friends and Family: More than three times a week    Attends Religious Services: More than 4 times per year    Active Member of [employer] or Organizations: No    Attends Music therapist: More than 4 times per year    Marital Status: Widowed     Family History: The patient's family history includes Alzheimer's disease in her brother; Coronary artery disease (age of onset: 55) in her mother; Diabetes in her mother and another family member; Heart attack (age of onset: 51) in her father.  ROS:  Please see the history of present illness.    + shortness of breath All other systems reviewed and are negative.  Labs/Other Studies Reviewed:    The following studies were reviewed today:   Echo 12/26/2015 Left ventricle: The cavity size was normal. Wall thickness was    normal. Systolic function was vigorous. The estimated ejection    fraction was in the range of 65% to 70%. Wall motion was normal;    there were no regional wall motion abnormalities. The study is    not technically sufficient to allow evaluation of LV diastolic    function.  - Aortic valve: Trileaflet. Sclerosis without stenosis. There was    trivial regurgitation.  - Mitral valve: Mildly thickened leaflets . There was trivial    regurgitation.  - Left atrium: The atrium was normal in size.  - Tricuspid valve: There was trivial regurgitation.  - Pulmonary arteries: PA peak pressure: 23 mm Hg (S).  - Inferior vena cava: The vessel was normal in size. The    respirophasic diameter changes were in the normal range (= 50%),    consistent with normal central venous pressure. Recent Labs: 04/15/2022:  ALT 29; BUN 24; Creatinine, Ser 1.45; Hemoglobin 13.1; Platelets 241.0; Potassium 5.0; Sodium 140; TSH 3.25  Recent Lipid Panel    Component Value Date/Time   CHOL 225 (H) 04/15/2022 1444   TRIG 189.0 (H) 04/15/2022 1444   HDL 76.00 04/15/2022 1444   CHOLHDL 3 04/15/2022 1444   VLDL 37.8 04/15/2022 1444   LDLCALC 111 (H) 04/15/2022 1444   LDLDIRECT 117.7 09/26/2013 1420     Risk Assessment/Calculations:    CHA2DS2-VASc Score = 4  {This indicates a 4.8% annual risk of stroke. The patient's score is based upon: CHF History: 0 HTN History: 1 Diabetes History: 0 Stroke History: 0 Vascular Disease History: 0 Age Score: 2 Gender Score: 1    Physical Exam:    VS:  BP (!) 140/78   Pulse 87   Ht '5\' 6"'$  (1.676 m)   Wt 153 lb 3.2 oz (69.5 kg)   SpO2 96%   BMI 24.73 kg/m     Wt Readings from Last 3 Encounters:  12/15/22 153 lb 3.2 oz (69.5 kg)  10/21/22 152 lb (68.9 kg)  09/11/22 152 lb 6 oz (69.1 kg)     GEN:  Well nourished, well developed in no acute distress HEENT: Normal NECK: No JVD; No carotid bruits CARDIAC: Irregular RR, no murmurs, rubs, gallops RESPIRATORY:  Clear to auscultation without rales, wheezing or rhonchi  ABDOMEN: Soft, non-tender, non-distended MUSCULOSKELETAL:  No edema; No deformity. 2+ pedal pulses, equal bilaterally SKIN: Warm and dry NEUROLOGIC:  Alert and oriented x 3 PSYCHIATRIC:  Normal affect   EKG:  EKG is ordered today.  The ekg ordered today demonstrates atrial fibrillation with well-controlled ventricular rate at 87 bpm, septal infarct, age undetermined  HYPERTENSION CONTROL Vitals:   12/15/22 1320 12/15/22 1342  BP: (!) 136/95 (!) 140/78    The patient's blood pressure is elevated above target today.  In order to address the patient's elevated BP: A current anti-hypertensive medication was adjusted today.     Diagnoses:    1. Atrial fibrillation with RVR ([place])   2. Essential hypertension   3. Chronic anticoagulation   4.  Shortness of breath   5. Incisional hernia, without obstruction or gangrene   6. Longstanding persistent atrial fibrillation (HCC)   7. Stage 3b chronic kidney disease ([place])    Assessment and  Plan:     Shortness of breath: Increased shortness of breath with certain activities over the past few months. Admits to dietary indiscretion with sodium and elevated BP readings.  No orthopnea, PND.  No edema presently, however she occasionally has LE edema for which she wears compression stockings with improvement. Appears euvolemic on exam. Advised low-sodium diet. We will get echocardiogram to evaluate for structural heart disease/heart failure.  BP is elevated today. If echo reveals heart failure, would recommend reaching out to nephrology to see if she will tolerate SGLT2i or ACE/ARB/ARNI/MRA.     Longstanding AF on chronic anticoagulation: AF with well controlled ventricular rate on EKG. Occasionally palpitations which are not concerning to her. HR well-controlled on metoprolol succinate 75 mg daily. No bleeding problems on Xarelto. Continue Xarelto 15 mg daily which is appropriate dose for creatinine clearance.  Hypertension: BP initially elevated and remains so on my recheck. Will have her increase amlodipine to 2.5 mg twice daily. If echo reveals heart failure, will consult with nephrology for GDMT. Low sodium diet.   CKD Stage 3b: S/p nephrectomy 2012. Sees nephrologist 2 times per year. Scr 1.3 on 09/16/22, no concerns that she is aware of. Continue to avoid nephrotoxic medications. As noted above, will need agreement from nephrology if GDMT indicated for CHF.   Abdominal hernia: She is concerned about bulging on right side at sight of previous incision. Saw surgeon at [facility] many years ago.  Will refer her back for evaluation of possible hernia.  Disposition: 6 months with Dr. Acie Fredrickson or me  Medication Adjustments/Labs and Tests Ordered: Current medicines are reviewed at length with the patient  today.  Concerns regarding medicines are outlined above.  Orders Placed This Encounter  Procedures   Ambulatory referral to General Surgery   EKG 12-Lead   ECHOCARDIOGRAM COMPLETE   Meds ordered this encounter  Medications   amLODipine (NORVASC) 2.5 MG tablet    Sig: Take 1 tablet (2.5 mg total) by mouth in the morning and at bedtime.    Dispense:  180 tablet    Refill:  3    Patient Instructions  Medication Instructions:   INCREASE Amlodipine one (1) tablet by mouth (2.5 mg) twice daily.  *If you need a refill on your cardiac medications before your next appointment, please call your pharmacy*   Lab Work:  None ordered.  If you have labs (blood work) drawn today and your tests are completely normal, you will receive your results only by: [place] (if you have MyChart) OR A paper copy in the mail If you have any lab test that is abnormal or we need to change your treatment, we will call you to review the results.   Testing/Procedures:  Your physician has requested that you have an echocardiogram. Echocardiography is a painless test that uses sound waves to create images of your heart. It provides your doctor with information about the size and shape of your heart and how well your heart's chambers and valves are working. This procedure takes approximately one hour. There are no restrictions for this procedure. Please do NOT wear perfume or lotions (deodorant is allowed). Please arrive 15 minutes prior to your appointment time.   Follow-Up: At [facility], you and your health needs are our priority.  As part of our continuing mission to provide you with exceptional heart care, we have created designated Provider Care Teams.  These Care Teams include your primary Cardiologist (physician) and Advanced Practice Providers (APPs -  Physician  Assistants and Nurse Practitioners) who all work together to provide you with the care you need, when you need it.  We  recommend signing up for the patient portal called "MyChart".  Sign up information is provided on this After Visit Summary.  MyChart is used to connect with patients for Virtual Visits (Telemedicine).  Patients are able to view lab/test results, encounter notes, upcoming appointments, etc.  Non-urgent messages can be sent to your provider as well.   To learn more about what you can do with MyChart, go to [email or web address].    Your next appointment:   6 month(s)  Provider:   [name], MD     Other Instructions  You have been referred to General Surgery,  This office will call you to schedule consult for your hernia.   HOW TO TAKE YOUR BLOOD PRESSURE  Rest 5 minutes before taking your blood pressure. Don't  smoke or drink caffeinated beverages for at least 30 minutes before. Take your blood pressure before (not after) you eat. Sit comfortably with your back supported and both feet on the floor ( don't cross your legs). Elevate your arm to heart level on a table or a desk. Use the proper sized cuff.  It should fit smoothly and snugly around your bare upper arm.  There should be  Enough room to slip a fingertip under the cuff.  The bottom edge of the cuff should be 1 inch above the crease Of the elbow. Please monitor your blood pressure once daily 2 hours after your am medication. If you blood pressure Consistently remains above 951 (systolic) top number or over 80 ( diastolic) bottom number X 3 days  Consecutively.  Please call our office at [phone] or send Mychart message.     ----Avoid cold medicines with D or DM at the end of them----  DASH Eating Plan DASH stands for Dietary Approaches to Stop Hypertension. The DASH eating plan is a healthy eating plan that has been shown to: Reduce high blood pressure (hypertension). Reduce your risk for type 2 diabetes, heart disease, and stroke. Help with weight loss. What are tips for following this plan? Reading food  labels Check food labels for the amount of salt (sodium) per serving. Choose foods with less than 5 percent of the Daily Value of sodium. Generally, foods with less than 300 milligrams (mg) of sodium per serving fit into this eating plan. To find whole grains, look for the word "whole" as the first word in the ingredient list. Shopping Buy products labeled as "low-sodium" or "no salt added." Buy fresh foods. Avoid canned foods and pre-made or frozen meals. Cooking Avoid adding salt when cooking. Use salt-free seasonings or herbs instead of table salt or sea salt. Check with your health care provider or pharmacist before using salt substitutes. Do not fry foods. Cook foods using healthy methods such as baking, boiling, grilling, roasting, and broiling instead. Cook with heart-healthy oils, such as olive, canola, avocado, soybean, or sunflower oil. Meal planning  Eat a balanced diet that includes: 4 or more servings of fruits and 4 or more servings of vegetables each day. Try to fill one-half of your plate with fruits and vegetables. 6-8 servings of whole grains each day. Less than 6 oz (170 g) of lean meat, poultry, or fish each day. A 3-oz (85-g) serving of meat is about the same size as a deck of cards. One egg equals 1 oz (28 g). 2-3 servings of low-fat dairy each day. One  serving is 1 cup (237 mL). 1 serving of nuts, seeds, or beans 5 times each week. 2-3 servings of heart-healthy fats. Healthy fats called omega-3 fatty acids are found in foods such as walnuts, flaxseeds, fortified milks, and eggs. These fats are also found in cold-water fish, such as sardines, salmon, and mackerel. Limit how much you eat of: Canned or prepackaged foods. Food that is high in trans fat, such as some fried foods. Food that is high in saturated fat, such as fatty meat. Desserts and other sweets, sugary drinks, and other foods with added sugar. Full-fat dairy products. Do not salt foods before eating. Do not  eat more than 4 egg yolks a week. Try to eat at least 2 vegetarian meals a week. Eat more home-cooked food and less restaurant, buffet, and fast food. Lifestyle When eating at a restaurant, ask that your food be prepared with less salt or no salt, if possible. If you drink alcohol: Limit how much you use to: 0-1 drink a day for women who are not pregnant. 0-2 drinks a day for men. Be aware of how much alcohol is in your drink. In the [place] one drink equals one 12 oz bottle of beer (355 mL), one 5 oz glass of wine (148 mL), or one 1 oz glass of hard liquor (44 mL). General information Avoid eating more than 2,300 mg of salt a day. If you have hypertension, you may need to reduce your sodium intake to 1,500 mg a day. Work with your health care provider to maintain a healthy body weight or to lose weight. Ask what an ideal weight is for you. Get at least 30 minutes of exercise that causes your heart to beat faster (aerobic exercise) most days of the week. Activities may include walking, swimming, or biking. Work with your health care provider or dietitian to adjust your eating plan to your individual calorie needs. What foods should I eat? Fruits All fresh, dried, or frozen fruit. Canned fruit in natural juice (without added sugar). Vegetables Fresh or frozen vegetables (raw, steamed, roasted, or grilled). Low-sodium or reduced-sodium tomato and vegetable juice. Low-sodium or reduced-sodium tomato sauce and tomato paste. Low-sodium or reduced-sodium canned vegetables. Grains Whole-grain or whole-wheat bread. Whole-grain or whole-wheat pasta. Brown rice. [place]. Bulgur. Whole-grain and low-sodium cereals. Pita bread. Low-fat, low-sodium crackers. Whole-wheat flour tortillas. Meats and other proteins Skinless chicken or [place]. Ground chicken or [place]. Pork with fat trimmed off. Fish and seafood. Egg whites. Dried beans, peas, or lentils. Unsalted nuts, nut butters, and seeds. Unsalted  canned beans. Lean cuts of beef with fat trimmed off. Low-sodium, lean precooked or cured meat, such as sausages or meat loaves. Dairy Low-fat (1%) or fat-free (skim) milk. Reduced-fat, low-fat, or fat-free cheeses. Nonfat, low-sodium ricotta or cottage cheese. Low-fat or nonfat yogurt. Low-fat, low-sodium cheese. Fats and oils Soft margarine without trans fats. Vegetable oil. Reduced-fat, low-fat, or light mayonnaise and salad dressings (reduced-sodium). Canola, safflower, olive, avocado, soybean, and sunflower oils. Avocado. Seasonings and condiments Herbs. Spices. Seasoning mixes without salt. Other foods Unsalted popcorn and pretzels. Fat-free sweets. The items listed above may not be a complete list of foods and beverages you can eat. Contact a dietitian for more information. What foods should I avoid? Fruits Canned fruit in a light or heavy syrup. Fried fruit. Fruit in cream or butter sauce. Vegetables Creamed or fried vegetables. Vegetables in a cheese sauce. Regular canned vegetables (not low-sodium or reduced-sodium). Regular canned tomato sauce and paste (not low-sodium  or reduced-sodium). Regular tomato and vegetable juice (not low-sodium or reduced-sodium). Angie Fava. Olives. Grains Baked goods made with fat, such as croissants, muffins, or some breads. Dry pasta or rice meal packs. Meats and other proteins Fatty cuts of meat. Ribs. Fried meat. Berniece Salines. Bologna, salami, and other precooked or cured meats, such as sausages or meat loaves. Fat from the back of a pig (fatback). Bratwurst. Salted nuts and seeds. Canned beans with added salt. Canned or smoked fish. Whole eggs or egg yolks. Chicken or [place] with skin. Dairy Whole or 2% milk, cream, and half-and-half. Whole or full-fat cream cheese. Whole-fat or sweetened yogurt. Full-fat cheese. Nondairy creamers. Whipped toppings. Processed cheese and cheese spreads. Fats and oils Butter. Stick margarine. Lard. Shortening. Ghee. Bacon fat.  Tropical oils, such as coconut, palm kernel, or palm oil. Seasonings and condiments Onion salt, garlic salt, seasoned salt, table salt, and sea salt. Worcestershire sauce. Tartar sauce. Barbecue sauce. Teriyaki sauce. Soy sauce, including reduced-sodium. Steak sauce. Canned and packaged gravies. Fish sauce. Oyster sauce. Cocktail sauce. Store-bought horseradish. Ketchup. Mustard. Meat flavorings and tenderizers. Bouillon cubes. Hot sauces. Pre-made or packaged marinades. Pre-made or packaged taco seasonings. Relishes. Regular salad dressings. Other foods Salted popcorn and pretzels. The items listed above may not be a complete list of foods and beverages you should avoid. Contact a dietitian for more information. Where to find more information National Heart, Lung, and Blood Institute: https://wilson-eaton.com/ American Heart Association: www.heart.org Academy of Nutrition and Dietetics: www.eatright.[place]: www.kidney.org Summary The DASH eating plan is a healthy eating plan that has been shown to reduce high blood pressure (hypertension). It may also reduce your risk for type 2 diabetes, heart disease, and stroke. When on the DASH eating plan, aim to eat more fresh fruits and vegetables, whole grains, lean proteins, low-fat dairy, and heart-healthy fats. With the DASH eating plan, you should limit salt (sodium) intake to 2,300 mg a day. If you have hypertension, you may need to reduce your sodium intake to 1,500 mg a day. Work with your health care provider or dietitian to adjust your eating plan to your individual calorie needs. This information is not intended to replace advice given to you by your health care provider. Make sure you discuss any questions you have with your health care provider. Document Revised: 09/29/2019 Document Reviewed: 09/29/2019 Elsevier Patient Education  2023 Prowers, Emmaline Life, NP  12/15/2022 5:19 PM    [place]

## 2022-12-15 ENCOUNTER — Encounter: Payer: Self-pay | Admitting: Nurse Practitioner

## 2022-12-15 ENCOUNTER — Ambulatory Visit: Payer: Medicare HMO | Attending: Nurse Practitioner | Admitting: Nurse Practitioner

## 2022-12-15 VITALS — BP 140/78 | HR 87 | Ht 66.0 in | Wt 153.2 lb

## 2022-12-15 DIAGNOSIS — R0602 Shortness of breath: Secondary | ICD-10-CM | POA: Diagnosis not present

## 2022-12-15 DIAGNOSIS — I4891 Unspecified atrial fibrillation: Secondary | ICD-10-CM

## 2022-12-15 DIAGNOSIS — K432 Incisional hernia without obstruction or gangrene: Secondary | ICD-10-CM

## 2022-12-15 DIAGNOSIS — I10 Essential (primary) hypertension: Secondary | ICD-10-CM | POA: Diagnosis not present

## 2022-12-15 DIAGNOSIS — Z7901 Long term (current) use of anticoagulants: Secondary | ICD-10-CM | POA: Diagnosis not present

## 2022-12-15 DIAGNOSIS — I4811 Longstanding persistent atrial fibrillation: Secondary | ICD-10-CM

## 2022-12-15 DIAGNOSIS — N1832 Chronic kidney disease, stage 3b: Secondary | ICD-10-CM

## 2022-12-15 MED ORDER — AMLODIPINE BESYLATE 2.5 MG PO TABS: 2.5000 mg | ORAL_TABLET | Freq: Two times a day (BID) | ORAL | 3 refills | Status: DC

## 2022-12-15 NOTE — Patient Instructions (Addendum)
Medication Instructions:   INCREASE Amlodipine one (1) tablet by mouth (2.5 mg) twice daily.  *If you need a refill on your cardiac medications before your next appointment, please call your pharmacy*   Lab Work:  None ordered.  If you have labs (blood work) drawn today and your tests are completely normal, you will receive your results only by: [place] (if you have MyChart) OR A paper copy in the mail If you have any lab test that is abnormal or we need to change your treatment, we will call you to review the results.   Testing/Procedures:  Your physician has requested that you have an echocardiogram. Echocardiography is a painless test that uses sound waves to create images of your heart. It provides your doctor with information about the size and shape of your heart and how well your heart's chambers and valves are working. This procedure takes approximately one hour. There are no restrictions for this procedure. Please do NOT wear perfume or lotions (deodorant is allowed). Please arrive 15 minutes prior to your appointment time.   Follow-Up: At [facility], you and your health needs are our priority.  As part of our continuing mission to provide you with exceptional heart care, we have created designated Provider Care Teams.  These Care Teams include your primary Cardiologist (physician) and Advanced Practice Providers (APPs -  Physician Assistants and Nurse Practitioners) who all work together to provide you with the care you need, when you need it.  We recommend signing up for the patient portal called "MyChart".  Sign up information is provided on this After Visit Summary.  MyChart is used to connect with patients for Virtual Visits (Telemedicine).  Patients are able to view lab/test results, encounter notes, upcoming appointments, etc.  Non-urgent messages can be sent to your provider as well.   To learn more about what you can do with MyChart, go to  [email or web address].    Your next appointment:   6 month(s)  Provider:   [name], MD     Other Instructions  You have been referred to General Surgery,  This office will call you to schedule consult for your hernia.   HOW TO TAKE YOUR BLOOD PRESSURE  Rest 5 minutes before taking your blood pressure. Don't  smoke or drink caffeinated beverages for at least 30 minutes before. Take your blood pressure before (not after) you eat. Sit comfortably with your back supported and both feet on the floor ( don't cross your legs). Elevate your arm to heart level on a table or a desk. Use the proper sized cuff.  It should fit smoothly and snugly around your bare upper arm.  There should be  Enough room to slip a fingertip under the cuff.  The bottom edge of the cuff should be 1 inch above the crease Of the elbow. Please monitor your blood pressure once daily 2 hours after your am medication. If you blood pressure Consistently remains above 629 (systolic) top number or over 80 ( diastolic) bottom number X 3 days  Consecutively.  Please call our office at [phone] or send Mychart message.     ----Avoid cold medicines with D or DM at the end of them----  DASH Eating Plan DASH stands for Dietary Approaches to Stop Hypertension. The DASH eating plan is a healthy eating plan that has been shown to: Reduce high blood pressure (hypertension). Reduce your risk for type 2 diabetes, heart disease, and stroke. Help with weight loss. What are  tips for following this plan? Reading food labels Check food labels for the amount of salt (sodium) per serving. Choose foods with less than 5 percent of the Daily Value of sodium. Generally, foods with less than 300 milligrams (mg) of sodium per serving fit into this eating plan. To find whole grains, look for the word "whole" as the first word in the ingredient list. Shopping Buy products labeled as "low-sodium" or "no salt added." Buy fresh foods.  Avoid canned foods and pre-made or frozen meals. Cooking Avoid adding salt when cooking. Use salt-free seasonings or herbs instead of table salt or sea salt. Check with your health care provider or pharmacist before using salt substitutes. Do not fry foods. Cook foods using healthy methods such as baking, boiling, grilling, roasting, and broiling instead. Cook with heart-healthy oils, such as olive, canola, avocado, soybean, or sunflower oil. Meal planning  Eat a balanced diet that includes: 4 or more servings of fruits and 4 or more servings of vegetables each day. Try to fill one-half of your plate with fruits and vegetables. 6-8 servings of whole grains each day. Less than 6 oz (170 g) of lean meat, poultry, or fish each day. A 3-oz (85-g) serving of meat is about the same size as a deck of cards. One egg equals 1 oz (28 g). 2-3 servings of low-fat dairy each day. One serving is 1 cup (237 mL). 1 serving of nuts, seeds, or beans 5 times each week. 2-3 servings of heart-healthy fats. Healthy fats called omega-3 fatty acids are found in foods such as walnuts, flaxseeds, fortified milks, and eggs. These fats are also found in cold-water fish, such as sardines, salmon, and mackerel. Limit how much you eat of: Canned or prepackaged foods. Food that is high in trans fat, such as some fried foods. Food that is high in saturated fat, such as fatty meat. Desserts and other sweets, sugary drinks, and other foods with added sugar. Full-fat dairy products. Do not salt foods before eating. Do not eat more than 4 egg yolks a week. Try to eat at least 2 vegetarian meals a week. Eat more home-cooked food and less restaurant, buffet, and fast food. Lifestyle When eating at a restaurant, ask that your food be prepared with less salt or no salt, if possible. If you drink alcohol: Limit how much you use to: 0-1 drink a day for women who are not pregnant. 0-2 drinks a day for men. Be aware of how much  alcohol is in your drink. In the [place] one drink equals one 12 oz bottle of beer (355 mL), one 5 oz glass of wine (148 mL), or one 1 oz glass of hard liquor (44 mL). General information Avoid eating more than 2,300 mg of salt a day. If you have hypertension, you may need to reduce your sodium intake to 1,500 mg a day. Work with your health care provider to maintain a healthy body weight or to lose weight. Ask what an ideal weight is for you. Get at least 30 minutes of exercise that causes your heart to beat faster (aerobic exercise) most days of the week. Activities may include walking, swimming, or biking. Work with your health care provider or dietitian to adjust your eating plan to your individual calorie needs. What foods should I eat? Fruits All fresh, dried, or frozen fruit. Canned fruit in natural juice (without added sugar). Vegetables Fresh or frozen vegetables (raw, steamed, roasted, or grilled). Low-sodium or reduced-sodium tomato and vegetable juice.  Low-sodium or reduced-sodium tomato sauce and tomato paste. Low-sodium or reduced-sodium canned vegetables. Grains Whole-grain or whole-wheat bread. Whole-grain or whole-wheat pasta. Brown rice. [place]. Bulgur. Whole-grain and low-sodium cereals. Pita bread. Low-fat, low-sodium crackers. Whole-wheat flour tortillas. Meats and other proteins Skinless chicken or [place]. Ground chicken or [place]. Pork with fat trimmed off. Fish and seafood. Egg whites. Dried beans, peas, or lentils. Unsalted nuts, nut butters, and seeds. Unsalted canned beans. Lean cuts of beef with fat trimmed off. Low-sodium, lean precooked or cured meat, such as sausages or meat loaves. Dairy Low-fat (1%) or fat-free (skim) milk. Reduced-fat, low-fat, or fat-free cheeses. Nonfat, low-sodium ricotta or cottage cheese. Low-fat or nonfat yogurt. Low-fat, low-sodium cheese. Fats and oils Soft margarine without trans fats. Vegetable oil. Reduced-fat, low-fat, or light  mayonnaise and salad dressings (reduced-sodium). Canola, safflower, olive, avocado, soybean, and sunflower oils. Avocado. Seasonings and condiments Herbs. Spices. Seasoning mixes without salt. Other foods Unsalted popcorn and pretzels. Fat-free sweets. The items listed above may not be a complete list of foods and beverages you can eat. Contact a dietitian for more information. What foods should I avoid? Fruits Canned fruit in a light or heavy syrup. Fried fruit. Fruit in cream or butter sauce. Vegetables Creamed or fried vegetables. Vegetables in a cheese sauce. Regular canned vegetables (not low-sodium or reduced-sodium). Regular canned tomato sauce and paste (not low-sodium or reduced-sodium). Regular tomato and vegetable juice (not low-sodium or reduced-sodium). Angie Fava. Olives. Grains Baked goods made with fat, such as croissants, muffins, or some breads. Dry pasta or rice meal packs. Meats and other proteins Fatty cuts of meat. Ribs. Fried meat. Berniece Salines. Bologna, salami, and other precooked or cured meats, such as sausages or meat loaves. Fat from the back of a pig (fatback). Bratwurst. Salted nuts and seeds. Canned beans with added salt. Canned or smoked fish. Whole eggs or egg yolks. Chicken or [place] with skin. Dairy Whole or 2% milk, cream, and half-and-half. Whole or full-fat cream cheese. Whole-fat or sweetened yogurt. Full-fat cheese. Nondairy creamers. Whipped toppings. Processed cheese and cheese spreads. Fats and oils Butter. Stick margarine. Lard. Shortening. Ghee. Bacon fat. Tropical oils, such as coconut, palm kernel, or palm oil. Seasonings and condiments Onion salt, garlic salt, seasoned salt, table salt, and sea salt. Worcestershire sauce. Tartar sauce. Barbecue sauce. Teriyaki sauce. Soy sauce, including reduced-sodium. Steak sauce. Canned and packaged gravies. Fish sauce. Oyster sauce. Cocktail sauce. Store-bought horseradish. Ketchup. Mustard. Meat flavorings and  tenderizers. Bouillon cubes. Hot sauces. Pre-made or packaged marinades. Pre-made or packaged taco seasonings. Relishes. Regular salad dressings. Other foods Salted popcorn and pretzels. The items listed above may not be a complete list of foods and beverages you should avoid. Contact a dietitian for more information. Where to find more information National Heart, Lung, and Blood Institute: https://wilson-eaton.com/ American Heart Association: www.heart.org Academy of Nutrition and Dietetics: www.eatright.[place]: www.kidney.org Summary The DASH eating plan is a healthy eating plan that has been shown to reduce high blood pressure (hypertension). It may also reduce your risk for type 2 diabetes, heart disease, and stroke. When on the DASH eating plan, aim to eat more fresh fruits and vegetables, whole grains, lean proteins, low-fat dairy, and heart-healthy fats. With the DASH eating plan, you should limit salt (sodium) intake to 2,300 mg a day. If you have hypertension, you may need to reduce your sodium intake to 1,500 mg a day. Work with your health care provider or dietitian to adjust your eating plan to your individual  calorie needs. This information is not intended to replace advice given to you by your health care provider. Make sure you discuss any questions you have with your health care provider. Document Revised: 09/29/2019 Document Reviewed: 09/29/2019 Elsevier Patient Education  [place].

## 2023-01-07 ENCOUNTER — Ambulatory Visit (HOSPITAL_COMMUNITY): Payer: Medicare HMO | Attending: Internal Medicine

## 2023-01-07 DIAGNOSIS — K432 Incisional hernia without obstruction or gangrene: Secondary | ICD-10-CM | POA: Diagnosis present

## 2023-01-07 DIAGNOSIS — I10 Essential (primary) hypertension: Secondary | ICD-10-CM | POA: Diagnosis present

## 2023-01-07 DIAGNOSIS — I4891 Unspecified atrial fibrillation: Secondary | ICD-10-CM | POA: Diagnosis present

## 2023-01-07 DIAGNOSIS — R0602 Shortness of breath: Secondary | ICD-10-CM | POA: Insufficient documentation

## 2023-01-07 DIAGNOSIS — Z7901 Long term (current) use of anticoagulants: Secondary | ICD-10-CM | POA: Diagnosis present

## 2023-01-08 LAB — ECHOCARDIOGRAM COMPLETE
Area-P 1/2: 4.29 cm2
MV M vel: 5.32 m/s
MV Peak grad: 113 mmHg
P 1/2 time: 385 msec
S' Lateral: 3 cm

## 2023-01-12 ENCOUNTER — Other Ambulatory Visit: Payer: Self-pay | Admitting: *Deleted

## 2023-01-12 DIAGNOSIS — R0602 Shortness of breath: Secondary | ICD-10-CM

## 2023-01-12 DIAGNOSIS — I4891 Unspecified atrial fibrillation: Secondary | ICD-10-CM

## 2023-01-12 MED ORDER — POTASSIUM CHLORIDE CRYS ER 10 MEQ PO TBCR: 10.0000 meq | EXTENDED_RELEASE_TABLET | Freq: Every day | ORAL | 3 refills | Status: DC

## 2023-01-12 MED ORDER — FUROSEMIDE 20 MG PO TABS: 20.0000 mg | ORAL_TABLET | Freq: Every day | ORAL | 3 refills | Status: DC

## 2023-01-22 ENCOUNTER — Ambulatory Visit: Payer: Medicare HMO | Attending: Nurse Practitioner

## 2023-01-22 DIAGNOSIS — R0602 Shortness of breath: Secondary | ICD-10-CM

## 2023-01-22 DIAGNOSIS — I4891 Unspecified atrial fibrillation: Secondary | ICD-10-CM

## 2023-01-23 LAB — BASIC METABOLIC PANEL
BUN/Creatinine Ratio: 17 (ref 12–28)
BUN: 37 mg/dL — ABNORMAL HIGH (ref 8–27)
CO2: 20 mmol/L (ref 20–29)
Calcium: 10.2 mg/dL (ref 8.7–10.3)
Chloride: 104 mmol/L (ref 96–106)
Creatinine, Ser: 2.15 mg/dL — ABNORMAL HIGH (ref 0.57–1.00)
Glucose: 107 mg/dL — ABNORMAL HIGH (ref 70–99)
Potassium: 5.4 mmol/L — ABNORMAL HIGH (ref 3.5–5.2)
Sodium: 139 mmol/L (ref 134–144)
eGFR: 22 mL/min/{1.73_m2} — ABNORMAL LOW (ref >59)

## 2023-01-25 ENCOUNTER — Telehealth: Payer: Self-pay | Admitting: Cardiovascular Disease

## 2023-01-25 DIAGNOSIS — Z79899 Other long term (current) drug therapy: Secondary | ICD-10-CM

## 2023-01-25 NOTE — Telephone Encounter (Signed)
Pt was returning nurses call in regards to results. Please advise

## 2023-01-26 ENCOUNTER — Other Ambulatory Visit: Payer: Self-pay | Admitting: *Deleted

## 2023-01-26 MED ORDER — FUROSEMIDE 20 MG PO TABS: 20.0000 mg | ORAL_TABLET | ORAL | 3 refills | Status: DC

## 2023-01-26 NOTE — Telephone Encounter (Signed)
-----   Message from Emmaline Life, NP sent at 01/23/2023  7:50 AM EDT ----- Kidney function and potassium have bumped on Lasix and potassium supplement. Would recommend that she hold potassium supplement and take Lasix 20 mg every other day. Return in 1 week or repeat BMET.

## 2023-02-02 ENCOUNTER — Ambulatory Visit: Payer: Medicare HMO | Attending: Cardiovascular Disease

## 2023-02-02 ENCOUNTER — Telehealth: Payer: Self-pay

## 2023-02-02 DIAGNOSIS — Z79899 Other long term (current) drug therapy: Secondary | ICD-10-CM

## 2023-02-02 NOTE — Telephone Encounter (Signed)
Murray to schedule their annual wellness visit. Appointment made for 02/09/23.  Norton Blizzard, [place] ([facility])  [facility] [phone]

## 2023-02-03 LAB — BASIC METABOLIC PANEL
BUN/Creatinine Ratio: 15 (ref 12–28)
BUN: 28 mg/dL — ABNORMAL HIGH (ref 8–27)
CO2: 19 mmol/L — ABNORMAL LOW (ref 20–29)
Calcium: 9.7 mg/dL (ref 8.7–10.3)
Chloride: 105 mmol/L (ref 96–106)
Creatinine, Ser: 1.85 mg/dL — ABNORMAL HIGH (ref 0.57–1.00)
Glucose: 96 mg/dL (ref 70–99)
Potassium: 4.5 mmol/L (ref 3.5–5.2)
Sodium: 141 mmol/L (ref 134–144)
eGFR: 27 mL/min/{1.73_m2} — ABNORMAL LOW (ref >59)

## 2023-02-04 ENCOUNTER — Telehealth: Payer: Self-pay | Admitting: Cardiovascular Disease

## 2023-02-04 MED ORDER — POTASSIUM CHLORIDE CRYS ER 10 MEQ PO TBCR: 10.0000 meq | EXTENDED_RELEASE_TABLET | ORAL | 3 refills | Status: DC

## 2023-02-04 NOTE — Telephone Encounter (Signed)
Returned call to patient to discuss lab results.  Per Christen Bame, NP: Kidney function and potassium have improved on lower dose of Lasix and holding potassium supplement. How is her shortness of breath? If she is feeling well, would recommend that she continue Lasix 20 mg and Kdur 10 mEq every other day. If she continues to have shortness of breath, we should make her a sooner appointment with me or Dr. Acie Fredrickson for medication adjustment.   Patient reports no change in SOB, she still gets short of breath and needs to stop to rest when walking up her driveway carrying items. She states she also has back pain from arthritis complicating this issue. She reports no increase in SOB since decreasing dose of Lasix.  Patient will continue taking Lasix 20mg  every other day and Kdur 83mEq every other day.  Advised patient to call our office is she experiences any worsening SOB.  Patient verbalized understanding of the above and expressed appreciation for call.

## 2023-02-04 NOTE — Telephone Encounter (Signed)
Patient is returning call about lab results  

## 2023-02-09 ENCOUNTER — Ambulatory Visit (INDEPENDENT_AMBULATORY_CARE_PROVIDER_SITE_OTHER): Payer: Medicare HMO

## 2023-02-09 VITALS — Ht 66.0 in | Wt 148.0 lb

## 2023-02-09 DIAGNOSIS — Z0000 Encounter for general adult medical examination without abnormal findings: Secondary | ICD-10-CM

## 2023-02-09 NOTE — Progress Notes (Signed)
I connected with  [name] on 02/09/23 by a audio enabled telemedicine application and verified that I am speaking with the correct person using two identifiers.  Patient Location: Home  Provider Location: Office/Clinic  I discussed the limitations of evaluation and management by telemedicine. The patient expressed understanding and agreed to proceed.  Subjective:   Carolyn Wells is a 82 y.o. female who presents for Medicare Annual (Subsequent) preventive examination.  Review of Systems     Cardiac Risk Factors include: advanced age (>61men, >53 women);dyslipidemia;family history of premature cardiovascular disease;hypertension     Objective:    Today's Vitals   02/09/23 1016  Weight: 148 lb (67.1 kg)  Height: 5\' 6"  (1.676 m)  PainSc: 0-No pain   Body mass index is 23.89 kg/m.     02/09/2023   10:18 AM 02/06/2022    9:47 AM 04/14/2019    6:34 AM 04/10/2019    2:47 PM 05/05/2017   11:47 AM 05/04/2016   11:18 AM 05/06/2015    1:55 PM  Advanced Directives  Does Patient Have a Medical Advance Directive? Yes Yes No No No No No;Yes  Type of Advance Directive Healthcare Power of [place]  Does patient want to make changes to medical advance directive?  No - Patient declined   No - Patient declined  No - Patient declined  Copy of [place] in Chart? No - copy requested No - copy requested       Would patient like information on creating a medical advance directive?   No - Patient declined No - Patient declined  No - patient declined information     Current Medications (verified) Outpatient Encounter Medications as of 02/09/2023  Medication Sig   amLODipine (NORVASC) 2.5 MG tablet Take 1 tablet (2.5 mg total) by mouth in the morning and at bedtime.   Ascorbic Acid (VITAMIN C) 1000 MG tablet Take 1,000 mg by mouth daily.   furosemide (LASIX) 20 MG tablet Take 1 tablet (20 mg total)  by mouth every other day.   hydrocortisone (ANUSOL-HC) 2.5 % rectal cream Place 1 Application rectally 2 (two) times daily.   hydrocortisone (ANUSOL-HC) 25 MG suppository Place 1 suppository (25 mg total) rectally at bedtime.   levothyroxine (SYNTHROID) 50 MCG tablet TAKE 1 TABLET BY MOUTH ONCE DAILY BEFORE BREAKFAST   LUMIGAN 0.01 % SOLN Place 1 drop into both eyes at bedtime.    metoprolol succinate (TOPROL-XL) 25 MG 24 hr tablet Take 3 tablets (75 mg total) by mouth daily.   Multiple Vitamins-Minerals (PRESERVISION AREDS 2 PO) Take 1 tablet by mouth 2 (two) times daily.   Multiple Vitamins-Minerals (WOMENS 50+ MULTI VITAMIN/MIN PO) Take 1 tablet by mouth daily.   pantoprazole (PROTONIX) 40 MG tablet Take 1 tablet by mouth once daily   potassium chloride (KLOR-CON M) 10 MEQ tablet Take 1 tablet (10 mEq total) by mouth every other day.   Rivaroxaban (XARELTO) 15 MG TABS tablet TAKE 1 TABLET BY MOUTH ONCE DAILY WITH SUPPER   VITAMIN K PO Take 100 mcg by mouth daily.   No facility-administered encounter medications on file as of 02/09/2023.    Allergies (verified) Tramadol, Hydrocodone, and Ipratropium   History: Past Medical History:  Diagnosis Date   ABDOMINAL INCISIONAL HERNIA 08/13/2009   s/p repair   Allergic rhinitis, cause unspecified 09/28/2011   BRADYCARDIA 09/26/2010   CKD (chronic kidney disease), stage III 02/26/2015  Solitary kidney   COLONIC POLYPS, HX OF [id number]   Complication of anesthesia    DIVERTICULOSIS, COLON 08/25/2007   Dysrhythmia    GASTROESOPHAGEAL REFLUX DISEASE 08/04/2007   HYPERLIPIDEMIA 09/27/2010   not on Rx therapy   HYPERTENSION 08/06/2007   Hypothyroidism    INTERNAL HEMORRHOIDS 08/04/2007   LOW BACK PAIN 08/06/2007   scoliosis; DJD; gets injections   OSTEOPENIA 08/04/2007   PAF (paroxysmal atrial fibrillation)    a.  Echo 2/17:  Vigorous LVF, EF 65-70%, no RWMA, Ao sclerosis, trivial AI, trivial MR, trivial TR, PASP 23 mmHg   PONV (postoperative  nausea and vomiting)    Solitary kidney, acquired 09/28/2011   donated R kidney to husband   Past Surgical History:  Procedure Laterality Date   LAMINECTOMY  1999   L4-5/lumbar   NEPHRECTOMY     Right   s/o incisional hernia repair     RUQ   STRABISMUS SURGERY Bilateral 04/14/2019   Procedure: STRABISMUS REPAIR BILATERAL;  Surgeon: Everitt Amber, MD;  Location: [facility];  Service: Ophthalmology;  Laterality: Bilateral;   TONSILLECTOMY     Family History  Problem Relation Age of Onset   Coronary artery disease Mother 40       1st degree relative    Diabetes Mother    Heart attack Father 47   Diabetes Other        1st degree relative   Alzheimer's disease Brother    Social History   Socioeconomic History   Marital status: Married    Spouse name: Not on file   Number of children: 2   Years of education: Not on file   Highest education level: Not on file  Occupational History   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    Employer: RETIRED  Tobacco Use   Smoking status: Never   Smokeless tobacco: Never  Substance and Sexual Activity   Alcohol use: No    Alcohol/week: 0.0 standard drinks of alcohol   Drug use: No   Sexual activity: Not on file  Other Topics Concern   Not on file  Social History Narrative   Husband disabled, renal failure, DM, HTN.   Originally from [place], [place] >> came to [place] 1966 (husband in [employer])   Social Determinants of Health   Financial Resource Strain: Low Risk  (02/09/2023)   Overall Financial Resource Strain (CARDIA)    Difficulty of Paying Living Expenses: Not hard at all  Food Insecurity: No Food Insecurity (02/09/2023)   Hunger Vital Sign    Worried About Running Out of Food in the Last Year: Never true    Ran Out of Food in the Last Year: Never true  Transportation Needs: No Transportation Needs (02/09/2023)   PRAPARE - Hydrologist (Medical): No    Lack of Transportation  (Non-Medical): No  Physical Activity: Sufficiently Active (02/09/2023)   Exercise Vital Sign    Days of Exercise per Week: 5 days    Minutes of Exercise per Session: 30 min  Stress: No Stress Concern Present (02/09/2023)   [place]    Feeling of Stress : Not at all  Social Connections: Moderately Integrated (02/09/2023)   Social Connection and Isolation Panel [NHANES]    Frequency of Communication with Friends and Family: More than three times a week    Frequency of Social Gatherings with Friends and Family: More than three times a week  Attends Religious Services: More than 4 times per year    Active Member of Clubs or Organizations: No    Attends Archivist Meetings: More than 4 times per year    Marital Status: Widowed    Tobacco Counseling Counseling given: Not Answered   Clinical Intake:  Pre-visit preparation completed: Yes  Pain : No/denies pain Pain Score: 0-No pain     BMI - recorded: 23.89 Nutritional Status: BMI of 19-24  Normal Nutritional Risks: None Diabetes: No  How often do you need to have someone help you when you read instructions, pamphlets, or other written materials from your doctor or pharmacy?: 1 - Never What is the last grade level you completed in school?: HSG  Diabetic? No  Interpreter Needed?: No  Information entered by :: Lisette Abu, LPN.   Activities of Daily Living    02/09/2023   10:21 AM  In your present state of health, do you have any difficulty performing the following activities:  Hearing? 0  Vision? 0  Difficulty concentrating or making decisions? 0  Walking or climbing stairs? 0  Dressing or bathing? 0  Doing errands, shopping? 0  Preparing Food and eating ? N  Using the Toilet? N  In the past six months, have you accidently leaked urine? N  Do you have problems with loss of bowel control? N  Managing your Medications? N  Managing your  Finances? N  Housekeeping or managing your Housekeeping? N    Patient Care Team: Biagio Borg, MD as PCP - General Nahser, Wonda Cheng, MD as PCP - Cardiology (Cardiology) Irene Shipper, MD as Consulting Physician (Gastroenterology)  Indicate any recent Medical Services you may have received from other than [facility] providers in the past year (date may be approximate).     Assessment:   This is a routine wellness examination for [place].  Hearing/Vision screen Hearing Screening - Comments:: Denies hearing difficulties   Vision Screening - Comments:: Cataracts removed; use readers for small print - up to date with routine eye exams with Marygrace Drought, MD.   Dietary issues and exercise activities discussed: Current Exercise Habits: Home exercise routine, Type of exercise: Other - see comments (statinary bike and will start back with physical therapy for the next 3 months starting 02/2023.), Time (Minutes): 30, Frequency (Times/Week): 5, Weekly Exercise (Minutes/Week): 150, Intensity: Mild, Exercise limited by: orthopedic condition(s);cardiac condition(s);respiratory conditions(s)   Goals Addressed             This Visit's Progress    My goal for 2024 is to get my back better by starting physical therapy for the next 3 months.        Depression Screen    02/09/2023   10:21 AM 04/15/2022    2:18 PM 04/15/2022    1:55 PM 02/06/2022    9:42 AM 10/15/2021    2:45 PM 10/09/2020    1:32 PM 10/09/2020    1:15 PM  PHQ 2/9 Scores  PHQ - 2 Score 0 0 0 0 3 1 0  PHQ- 9 Score   2      Exception Documentation     Other- indicate reason in comment box    Not completed     Back pain is causing her to fee depressed.      Fall Risk    02/09/2023   10:20 AM 04/15/2022    2:18 PM 04/15/2022    1:55 PM 02/06/2022    9:39 AM 10/15/2021  2:46 PM  Fall Risk   Falls in the past year? 0 0 0 0 0  Number falls in past yr: 0 0 0 0 0  Injury with Fall? 0 0 0 0 0  Risk for fall due to : No Fall Risks   No Fall  Risks   Follow up Falls prevention discussed   Falls evaluation completed     FALL RISK PREVENTION PERTAINING TO THE HOME:  Any stairs in or around the home? No  If so, are there any without handrails? No  Home free of loose throw rugs in walkways, pet beds, electrical cords, etc? Yes  Adequate lighting in your home to reduce risk of falls? Yes   ASSISTIVE DEVICES UTILIZED TO PREVENT FALLS:  Life alert? Yes  Patient has Alexa and Cameras Use of a cane, walker or w/c? No  Grab bars in the bathroom? Yes  Shower chair or bench in shower? No  Elevated toilet seat or a handicapped toilet? No   TIMED UP AND GO:  Was the test performed? No . Telephonic Visit  Cognitive Function:        02/09/2023   10:21 AM 02/06/2022    9:49 AM  6CIT Screen  What Year? 0 points 0 points  What month? 0 points 0 points  What time? 0 points 0 points  Count back from 20 0 points 0 points  Months in reverse 0 points 0 points  Repeat phrase 0 points 0 points  Total Score 0 points 0 points    Immunizations Immunization History  Administered Date(s) Administered   Fluad Quad(high Dose 65+) 10/09/2020, 10/08/2021   Influenza Split 09/28/2011, 09/28/2012   Influenza Whole 09/10/2006, 08/25/2007, 08/30/2008, 08/13/2009, 09/26/2010   Influenza, High Dose Seasonal PF 08/27/2017, 09/16/2018   Influenza, Seasonal, Injecte, Preservative Fre 09/01/2013   Influenza,inj,Quad PF,6+ Mos 08/28/2015   PFIZER(Purple Top)SARS-COV-2 Vaccination 01/11/2020, 02/06/2020, 08/07/2020   Pneumococcal Conjugate-13 09/28/2013   Pneumococcal Polysaccharide-23 08/09/2005, 08/10/2005, 03/27/2011   Td 11/09/1989, 08/13/2009   Tdap 09/19/2019   Zoster Recombinat (Shingrix) 09/20/2019, 11/19/2019   Zoster, Live 07/28/2006    TDAP status: Up to date  Flu Vaccine status: Declined, Education has been provided regarding the importance of this vaccine but patient still declined. Advised may receive this vaccine at local  pharmacy or Health Dept. Aware to provide a copy of the vaccination record if obtained from local pharmacy or Health Dept. Verbalized acceptance and understanding.  Pneumococcal vaccine status: Up to date  Covid-19 vaccine status: Completed vaccines  Qualifies for Shingles Vaccine? Yes   Zostavax completed Yes   Shingrix Completed?: Yes  Screening Tests Health Maintenance  Topic Date Due   INFLUENZA VACCINE  06/10/2023   Medicare Annual Wellness (AWV)  02/09/2024   DTaP/Tdap/Td (4 - Td or Tdap) 09/18/2029   Pneumonia Vaccine 40+ Years old  Completed   DEXA SCAN  Completed   Zoster Vaccines- Shingrix  Completed   HPV VACCINES  Aged Out   COVID-19 Vaccine  Discontinued    Health Maintenance  There are no preventive care reminders to display for this patient.   Colorectal cancer screening: No longer required.   Mammogram status: Completed 03/20/2022. Repeat every year  Bone Density status: Completed 05/03/2017. Results reflect: Bone density results: OSTEOPENIA. Repeat every 5 years. Completed at OB/GYN office.  Lung Cancer Screening: (Low Dose CT Chest recommended if Age 65-80 years, 30 pack-year currently smoking OR have quit w/in 15years.) does not qualify.   Lung Cancer Screening Referral:  no  Additional Screening:  Hepatitis C Screening: does not qualify; Completed: No  Vision Screening: Recommended annual ophthalmology exams for early detection of glaucoma and other disorders of the eye. Is the patient up to date with their annual eye exam?  Yes  Who is the provider or what is the name of the office in which the patient attends annual eye exams? Marygrace Drought, MD. If pt is not established with a provider, would they like to be referred to a provider to establish care? No .   Dental Screening: Recommended annual dental exams for proper oral hygiene  Community Resource Referral / Chronic Care Management: CRR required this visit?  No   CCM required this visit?  No       Plan:     I have personally reviewed and noted the following in the patient's chart:   Medical and social history Use of alcohol, tobacco or illicit drugs  Current medications and supplements including opioid prescriptions. Patient is not currently taking opioid prescriptions. Functional ability and status Nutritional status Physical activity Advanced directives List of other physicians Hospitalizations, surgeries, and ER visits in previous 12 months Vitals Screenings to include cognitive, depression, and falls Referrals and appointments  In addition, I have reviewed and discussed with patient certain preventive protocols, quality metrics, and best practice recommendations. A written personalized care plan for preventive services as well as general preventive health recommendations were provided to patient.     Sheral Flow, LPN   [id number]   Nurse Notes: Normal cognitive status assessed by direct observation by this Nurse Health Advisor. No abnormalities found.

## 2023-02-09 NOTE — Patient Instructions (Signed)
[name] , Thank you for taking time to come for your Medicare Wellness Visit. I appreciate your ongoing commitment to your health goals. Please review the following plan we discussed and let me know if I can assist you in the future.   These are the goals we discussed:  Goals      My goal for 2024 is to get my back better by starting physical therapy for the next 3 months.        This is a list of the screening recommended for you and due dates:  Health Maintenance  Topic Date Due   Flu Shot  06/10/2023   Medicare Annual Wellness Visit  02/09/2024   DTaP/Tdap/Td vaccine (4 - Td or Tdap) 09/18/2029   Pneumonia Vaccine  Completed   DEXA scan (bone density measurement)  Completed   Zoster (Shingles) Vaccine  Completed   HPV Vaccine  Aged Out   COVID-19 Vaccine  Discontinued    Advanced directives: Yes; patient has a Power of Attorney  Conditions/risks identified: Yes  Next appointment: Follow up in one year for your annual wellness visit.   Preventive Care 60 Years and Older, Female Preventive care refers to lifestyle choices and visits with your health care provider that can promote health and wellness. What does preventive care include? A yearly physical exam. This is also called an annual well check. Dental exams once or twice a year. Routine eye exams. Ask your health care provider how often you should have your eyes checked. Personal lifestyle choices, including: Daily care of your teeth and gums. Regular physical activity. Eating a healthy diet. Avoiding tobacco and drug use. Limiting alcohol use. Practicing safe sex. Taking low-dose aspirin every day. Taking vitamin and mineral supplements as recommended by your health care provider. What happens during an annual well check? The services and screenings done by your health care provider during your annual well check will depend on your age, overall health, lifestyle risk factors, and family history of  disease. Counseling  Your health care provider may ask you questions about your: Alcohol use. Tobacco use. Drug use. Emotional well-being. Home and relationship well-being. Sexual activity. Eating habits. History of falls. Memory and ability to understand (cognition). Work and work Statistician. Reproductive health. Screening  You may have the following tests or measurements: Height, weight, and BMI. Blood pressure. Lipid and cholesterol levels. These may be checked every 5 years, or more frequently if you are over 20 years old. Skin check. Lung cancer screening. You may have this screening every year starting at age 77 if you have a 30-pack-year history of smoking and currently smoke or have quit within the past 15 years. Fecal occult blood test (FOBT) of the stool. You may have this test every year starting at age 49. Flexible sigmoidoscopy or colonoscopy. You may have a sigmoidoscopy every 5 years or a colonoscopy every 10 years starting at age 36. Hepatitis C blood test. Hepatitis B blood test. Sexually transmitted disease (STD) testing. Diabetes screening. This is done by checking your blood sugar (glucose) after you have not eaten for a while (fasting). You may have this done every 1-3 years. Bone density scan. This is done to screen for osteoporosis. You may have this done starting at age 55. Mammogram. This may be done every 1-2 years. Talk to your health care provider about how often you should have regular mammograms. Talk with your health care provider about your test results, treatment options, and if necessary, the need for  more tests. Vaccines  Your health care provider may recommend certain vaccines, such as: Influenza vaccine. This is recommended every year. Tetanus, diphtheria, and acellular pertussis (Tdap, Td) vaccine. You may need a Td booster every 10 years. Zoster vaccine. You may need this after age 57. Pneumococcal 13-valent conjugate (PCV13) vaccine. One  dose is recommended after age 26. Pneumococcal polysaccharide (PPSV23) vaccine. One dose is recommended after age 105. Talk to your health care provider about which screenings and vaccines you need and how often you need them. This information is not intended to replace advice given to you by your health care provider. Make sure you discuss any questions you have with your health care provider. Document Released: 11/22/2015 Document Revised: 07/15/2016 Document Reviewed: 08/27/2015 Elsevier Interactive Patient Education  2017 [place] Prevention in the Home Falls can cause injuries. They can happen to people of all ages. There are many things you can do to make your home safe and to help prevent falls. What can I do on the outside of my home? Regularly fix the edges of walkways and driveways and fix any cracks. Remove anything that might make you trip as you walk through a door, such as a raised step or threshold. Trim any bushes or trees on the path to your home. Use bright outdoor lighting. Clear any walking paths of anything that might make someone trip, such as rocks or tools. Regularly check to see if handrails are loose or broken. Make sure that both sides of any steps have handrails. Any raised decks and porches should have guardrails on the edges. Have any leaves, snow, or ice cleared regularly. Use sand or salt on walking paths during winter. Clean up any spills in your garage right away. This includes oil or grease spills. What can I do in the bathroom? Use night lights. Install grab bars by the toilet and in the tub and shower. Do not use towel bars as grab bars. Use non-skid mats or decals in the tub or shower. If you need to sit down in the shower, use a plastic, non-slip stool. Keep the floor dry. Clean up any water that spills on the floor as soon as it happens. Remove soap buildup in the tub or shower regularly. Attach bath mats securely with double-sided  non-slip rug tape. Do not have throw rugs and other things on the floor that can make you trip. What can I do in the bedroom? Use night lights. Make sure that you have a light by your bed that is easy to reach. Do not use any sheets or blankets that are too big for your bed. They should not hang down onto the floor. Have a firm chair that has side arms. You can use this for support while you get dressed. Do not have throw rugs and other things on the floor that can make you trip. What can I do in the kitchen? Clean up any spills right away. Avoid walking on wet floors. Keep items that you use a lot in easy-to-reach places. If you need to reach something above you, use a strong step stool that has a grab bar. Keep electrical cords out of the way. Do not use floor polish or wax that makes floors slippery. If you must use wax, use non-skid floor wax. Do not have throw rugs and other things on the floor that can make you trip. What can I do with my stairs? Do not leave any items on the stairs. Make  sure that there are handrails on both sides of the stairs and use them. Fix handrails that are broken or loose. Make sure that handrails are as long as the stairways. Check any carpeting to make sure that it is firmly attached to the stairs. Fix any carpet that is loose or worn. Avoid having throw rugs at the top or bottom of the stairs. If you do have throw rugs, attach them to the floor with carpet tape. Make sure that you have a light switch at the top of the stairs and the bottom of the stairs. If you do not have them, ask someone to add them for you. What else can I do to help prevent falls? Wear shoes that: Do not have high heels. Have rubber bottoms. Are comfortable and fit you well. Are closed at the toe. Do not wear sandals. If you use a stepladder: Make sure that it is fully opened. Do not climb a closed stepladder. Make sure that both sides of the stepladder are locked into place. Ask  someone to hold it for you, if possible. Clearly mark and make sure that you can see: Any grab bars or handrails. First and last steps. Where the edge of each step is. Use tools that help you move around (mobility aids) if they are needed. These include: Canes. Walkers. Scooters. Crutches. Turn on the lights when you go into a dark area. Replace any light bulbs as soon as they burn out. Set up your furniture so you have a clear path. Avoid moving your furniture around. If any of your floors are uneven, fix them. If there are any pets around you, be aware of where they are. Review your medicines with your doctor. Some medicines can make you feel dizzy. This can increase your chance of falling. Ask your doctor what other things that you can do to help prevent falls. This information is not intended to replace advice given to you by your health care provider. Make sure you discuss any questions you have with your health care provider. Document Released: 08/22/2009 Document Revised: 04/02/2016 Document Reviewed: 11/30/2014 Elsevier Interactive Patient Education  2017 [employer].

## 2023-02-23 ENCOUNTER — Other Ambulatory Visit: Payer: Self-pay | Admitting: Internal Medicine

## 2023-03-02 ENCOUNTER — Telehealth: Payer: Self-pay | Admitting: Internal Medicine

## 2023-03-02 MED ORDER — LEVOTHYROXINE SODIUM 50 MCG PO TABS: 50.0000 ug | ORAL_TABLET | Freq: Every day | ORAL | 0 refills | Status: DC

## 2023-03-02 MED ORDER — PANTOPRAZOLE SODIUM 40 MG PO TBEC: 40.0000 mg | DELAYED_RELEASE_TABLET | Freq: Every day | ORAL | 0 refills | Status: DC

## 2023-03-02 NOTE — Telephone Encounter (Signed)
Sent 90 day to pof. Pt is due for annual in June.Marland KitchenRaechel Wells

## 2023-03-02 NOTE — Telephone Encounter (Signed)
Prescription Request  03/02/2023  LOV: 04/15/2022  What is the name of the medication or equipment? pantoprazole (PROTONIX) 40 MG tablet  levothyroxine (SYNTHROID) 50 MCG tablet  Have you contacted your pharmacy to request a refill? Yes   Which pharmacy would you like this sent to?  [id number], [place] - [address] [facility] [place] [address] Phone: [phone] Fax: [phone]    Patient notified that their request is being sent to the clinical staff for review and that they should receive a response within 2 business days.   Please advise at [facility] [phone]   Next OV is 04/16/2023

## 2023-03-18 LAB — LAB REPORT - SCANNED: EGFR: 29

## 2023-03-28 ENCOUNTER — Other Ambulatory Visit: Payer: Self-pay | Admitting: Cardiovascular Disease

## 2023-03-29 NOTE — Telephone Encounter (Signed)
Prescription refill request for Xarelto received.  Indication:afib Last office visit:2/24 Weight:67.1  kg Age:82 Scr:1.8 CrCl:25.52  ml/min  Prescription refilled

## 2023-04-02 LAB — HM MAMMOGRAPHY

## 2023-04-07 ENCOUNTER — Encounter: Payer: Self-pay | Admitting: Internal Medicine

## 2023-04-16 ENCOUNTER — Ambulatory Visit: Payer: Medicare HMO | Admitting: Internal Medicine

## 2023-04-21 ENCOUNTER — Encounter: Payer: Self-pay | Admitting: Internal Medicine

## 2023-04-21 ENCOUNTER — Ambulatory Visit (INDEPENDENT_AMBULATORY_CARE_PROVIDER_SITE_OTHER): Payer: Medicare HMO | Admitting: Internal Medicine

## 2023-04-21 ENCOUNTER — Telehealth: Payer: Self-pay

## 2023-04-21 VITALS — BP 130/86 | HR 54 | Temp 98.0°F | Ht 66.0 in | Wt 146.1 lb

## 2023-04-21 DIAGNOSIS — E039 Hypothyroidism, unspecified: Secondary | ICD-10-CM

## 2023-04-21 DIAGNOSIS — R739 Hyperglycemia, unspecified: Secondary | ICD-10-CM

## 2023-04-21 DIAGNOSIS — N1831 Chronic kidney disease, stage 3a: Secondary | ICD-10-CM

## 2023-04-21 DIAGNOSIS — E559 Vitamin D deficiency, unspecified: Secondary | ICD-10-CM | POA: Diagnosis not present

## 2023-04-21 DIAGNOSIS — M545 Low back pain, unspecified: Secondary | ICD-10-CM

## 2023-04-21 DIAGNOSIS — E538 Deficiency of other specified B group vitamins: Secondary | ICD-10-CM

## 2023-04-21 DIAGNOSIS — I700 Atherosclerosis of aorta: Secondary | ICD-10-CM | POA: Diagnosis not present

## 2023-04-21 DIAGNOSIS — Z0001 Encounter for general adult medical examination with abnormal findings: Secondary | ICD-10-CM | POA: Diagnosis not present

## 2023-04-21 DIAGNOSIS — I10 Essential (primary) hypertension: Secondary | ICD-10-CM | POA: Diagnosis not present

## 2023-04-21 DIAGNOSIS — E7800 Pure hypercholesterolemia, unspecified: Secondary | ICD-10-CM

## 2023-04-21 DIAGNOSIS — G8929 Other chronic pain: Secondary | ICD-10-CM

## 2023-04-21 LAB — BASIC METABOLIC PANEL
BUN: 29 mg/dL — ABNORMAL HIGH (ref 6–23)
CO2: 25 mEq/L (ref 19–32)
Calcium: 9.8 mg/dL (ref 8.4–10.5)
Chloride: 104 mEq/L (ref 96–112)
Creatinine, Ser: 1.73 mg/dL — ABNORMAL HIGH (ref 0.40–1.20)
GFR: 27.1 mL/min — ABNORMAL LOW (ref >60.00)
Glucose, Bld: 96 mg/dL (ref 70–99)
Potassium: 4.7 mEq/L (ref 3.5–5.1)
Sodium: 138 mEq/L (ref 135–145)

## 2023-04-21 LAB — CBC WITH DIFFERENTIAL/PLATELET
Basophils Absolute: 0 10*3/uL (ref 0.0–0.1)
Basophils Relative: 0.5 % (ref 0.0–3.0)
Eosinophils Absolute: 0.1 10*3/uL (ref 0.0–0.7)
Eosinophils Relative: 1.6 % (ref 0.0–5.0)
HCT: 37.2 % (ref 36.0–46.0)
Hemoglobin: 12.2 g/dL (ref 12.0–15.0)
Lymphocytes Relative: 18.5 % (ref 12.0–46.0)
Lymphs Abs: 1.5 10*3/uL (ref 0.7–4.0)
MCHC: 32.9 g/dL (ref 30.0–36.0)
MCV: 101.2 fl — ABNORMAL HIGH (ref 78.0–100.0)
Monocytes Absolute: 0.9 10*3/uL (ref 0.1–1.0)
Monocytes Relative: 11.6 % (ref 3.0–12.0)
Neutro Abs: 5.3 10*3/uL (ref 1.4–7.7)
Neutrophils Relative %: 67.8 % (ref 43.0–77.0)
Platelets: 272 10*3/uL (ref 150.0–400.0)
RBC: 3.68 Mil/uL — ABNORMAL LOW (ref 3.87–5.11)
RDW: 14 % (ref 11.5–15.5)
WBC: 7.9 10*3/uL (ref 4.0–10.5)

## 2023-04-21 LAB — HEPATIC FUNCTION PANEL
ALT: 12 U/L (ref 0–35)
AST: 14 U/L (ref 0–37)
Albumin: 4.1 g/dL (ref 3.5–5.2)
Alkaline Phosphatase: 88 U/L (ref 39–117)
Bilirubin, Direct: 0.1 mg/dL (ref 0.0–0.3)
Total Bilirubin: 0.5 mg/dL (ref 0.2–1.2)
Total Protein: 7.3 g/dL (ref 6.0–8.3)

## 2023-04-21 LAB — LIPID PANEL
Cholesterol: 209 mg/dL — ABNORMAL HIGH (ref 0–200)
HDL: 67.9 mg/dL (ref >39.00)
LDL Cholesterol: 104 mg/dL — ABNORMAL HIGH (ref 0–99)
NonHDL: 140.89
Total CHOL/HDL Ratio: 3
Triglycerides: 182 mg/dL — ABNORMAL HIGH (ref 0.0–149.0)
VLDL: 36.4 mg/dL (ref 0.0–40.0)

## 2023-04-21 LAB — URINALYSIS, ROUTINE W REFLEX MICROSCOPIC
Bilirubin Urine: NEGATIVE
Hgb urine dipstick: NEGATIVE
Ketones, ur: NEGATIVE
Leukocytes,Ua: NEGATIVE
Nitrite: NEGATIVE
RBC / HPF: NONE SEEN (ref 0–?)
Specific Gravity, Urine: 1.015 (ref 1.000–1.030)
Total Protein, Urine: NEGATIVE
Urine Glucose: NEGATIVE
Urobilinogen, UA: 0.2 (ref 0.0–1.0)
pH: 5.5 (ref 5.0–8.0)

## 2023-04-21 LAB — VITAMIN B12: Vitamin B-12: 1006 pg/mL — ABNORMAL HIGH (ref 211–911)

## 2023-04-21 LAB — TSH: TSH: 2.12 u[IU]/mL (ref 0.35–5.50)

## 2023-04-21 LAB — VITAMIN D 25 HYDROXY (VIT D DEFICIENCY, FRACTURES): VITD: >120 ng/mL

## 2023-04-21 LAB — HEMOGLOBIN A1C: Hgb A1c MFr Bld: 5.4 % (ref 4.6–6.5)

## 2023-04-21 NOTE — Telephone Encounter (Signed)
CRITICAL VALUE STICKER  CRITICAL VALUE: Vitamin D greater than 120  RECEIVER (on-site recipient of call): Carolyn Wells,CMA  DATE & TIME NOTIFIED: 04/21/2023  MESSENGER (representative from lab): Autry.Rack  MD NOTIFIED: Dr Jonny Ruiz   TIME OF NOTIFICATION:4:37  RESPONSE: n/a

## 2023-04-21 NOTE — Progress Notes (Signed)
Patient ID: Carolyn Wells, female   DOB: 08-16-1941, 82 y.o.   MRN: [id number]         Chief Complaint:: wellness exam and chronic lbp, aortic atherosclerosis, hld, htn, ckd3a, low thyroid       HPI:  Carolyn Wells is a 82 y.o. female here for wellness exam; up to date               Also Pt continues to have recurring LBP without change in severity, bowel or bladder change, fever, wt loss,  worsening LE pain/numbness/weakness, gait change or falls.  Has pain only with standing and walking  Currently has ongoing PT.  Has ESI recently but no help so far.  Was walker at home.  Has ortho f/u appt tomorrow.  Does not want tramadl or pain management referral for now.    Pt denies chest pain, increased sob or doe, wheezing, orthopnea, PND, increased LE swelling, palpitations, dizziness or syncope.   Pt denies polydipsia, polyuria, or new focal neuro s/s.    Pt denies fever, wt loss, night sweats, loss of appetite, or other constitutional symptoms  Denies hyper or hypo thyroid symptoms such as voice, skin or hair change.     Wt Readings from Last 3 Encounters:  04/21/23 146 lb 2 oz (66.3 kg)  02/09/23 148 lb (67.1 kg)  12/15/22 153 lb 3.2 oz (69.5 kg)   BP Readings from Last 3 Encounters:  04/21/23 130/86  12/15/22 (!) 140/78  10/21/22 (!) 162/93   Immunization History  Administered Date(s) Administered   Fluad Quad(high Dose 65+) 10/09/2020, 10/08/2021   Influenza Split 09/28/2011, 09/28/2012   Influenza Whole 09/10/2006, 08/25/2007, 08/30/2008, 08/13/2009, 09/26/2010   Influenza, High Dose Seasonal PF 08/27/2017, 09/16/2018   Influenza, Seasonal, Injecte, Preservative Fre 09/01/2013   Influenza,inj,Quad PF,6+ Mos 08/28/2015   PFIZER(Purple Top)SARS-COV-2 Vaccination 01/11/2020, 02/06/2020, 08/07/2020   Pneumococcal Conjugate-13 09/28/2013   Pneumococcal Polysaccharide-23 08/09/2005, 08/10/2005, 03/27/2011   Td 11/09/1989, 08/13/2009   Tdap 09/19/2019   Zoster Recombinat (Shingrix)  09/20/2019, 11/19/2019   Zoster, Live 07/28/2006  There are no preventive care reminders to display for this patient.    Past Medical History:  Diagnosis Date   ABDOMINAL INCISIONAL HERNIA 08/13/2009   s/p repair   Allergic rhinitis, cause unspecified 09/28/2011   BRADYCARDIA 09/26/2010   CKD (chronic kidney disease), stage III (HCC) 02/26/2015   Solitary kidney   COLONIC POLYPS, HX OF 08/25/2007   Complication of anesthesia    DIVERTICULOSIS, COLON 08/25/2007   Dysrhythmia    GASTROESOPHAGEAL REFLUX DISEASE 08/04/2007   HYPERLIPIDEMIA 09/27/2010   not on Rx therapy   HYPERTENSION 08/06/2007   Hypothyroidism    INTERNAL HEMORRHOIDS 08/04/2007   LOW BACK PAIN 08/06/2007   scoliosis; DJD; gets injections   OSTEOPENIA 08/04/2007   PAF (paroxysmal atrial fibrillation) (HCC)    a.  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   Past Surgical History:  Procedure Laterality Date   LAMINECTOMY  1999   L4-5/lumbar   NEPHRECTOMY     Right   s/o incisional hernia repair     RUQ   STRABISMUS SURGERY Bilateral 04/14/2019   Procedure: STRABISMUS REPAIR BILATERAL;  Surgeon: Verne Carrow, MD;  Location: [facility];  Service: Ophthalmology;  Laterality: Bilateral;   TONSILLECTOMY  reports that she has never smoked. She has never used smokeless tobacco. She reports that she does not drink alcohol and does not use drugs. family history includes Alzheimer's disease in her brother; Coronary artery disease (age of onset: 67) in her mother; Diabetes in her mother and another family member; Heart attack (age of onset: 48) in her father. Allergies  Allergen Reactions   Tramadol Other (See Comments)    Loopy feeling   Hydrocodone Nausea Only and Other (See Comments)    lethargy   Ipratropium     Causes  nasal bleeding   Current Outpatient Medications on File Prior to Visit  Medication Sig Dispense Refill   amLODipine (NORVASC) 2.5 MG tablet Take 1 tablet (2.5 mg total) by mouth in the morning and at bedtime. 180 tablet 3   Ascorbic Acid (VITAMIN C) 1000 MG tablet Take 1,000 mg by mouth daily.     furosemide (LASIX) 20 MG tablet Take 1 tablet (20 mg total) by mouth every other day. 45 tablet 3   hydrocortisone (ANUSOL-HC) 2.5 % rectal cream Place 1 Application rectally 2 (two) times daily. 30 g 1   hydrocortisone (ANUSOL-HC) 25 MG suppository Place 1 suppository (25 mg total) rectally at bedtime. 24 suppository 2   levothyroxine (SYNTHROID) 50 MCG tablet Take 1 tablet (50 mcg total) by mouth daily before breakfast. Annual appt due in June must see provider for future refills 90 tablet 0   LUMIGAN 0.01 % SOLN Place 1 drop into both eyes at bedtime.      metoprolol succinate (TOPROL-XL) 25 MG 24 hr tablet Take 3 tablets (75 mg total) by mouth daily. 180 tablet 2   Multiple Vitamins-Minerals (PRESERVISION AREDS 2 PO) Take 1 tablet by mouth 2 (two) times daily.     Multiple Vitamins-Minerals (WOMENS 50+ MULTI VITAMIN/MIN PO) Take 1 tablet by mouth daily.     pantoprazole (PROTONIX) 40 MG tablet Take 1 tablet (40 mg total) by mouth daily. Annual appt due in June must see provider for future refills 90 tablet 0   potassium chloride (KLOR-CON M) 10 MEQ tablet Take 1 tablet (10 mEq total) by mouth every other day. 45 tablet 3   Rivaroxaban (XARELTO) 15 MG TABS tablet TAKE 1 TABLET BY MOUTH ONCE DAILY WITH SUPPER 90 tablet 1   VITAMIN K PO Take 100 mcg by mouth daily.     No current facility-administered medications on file prior to visit.        ROS:  All others reviewed and negative.  Objective        PE:  BP 130/86   Pulse (!) 54   Temp 98 F (36.7 C) (Temporal)   Ht 5\' 6"  (1.676 m)   Wt 146 lb 2 oz (66.3 kg)   SpO2 98%   BMI 23.59 kg/m                 Constitutional: Pt appears in NAD                HENT: Head: NCAT.                Right Ear: External ear normal.                 Left Ear: External ear normal.                Eyes: . Pupils are equal, round, and reactive to light. Conjunctivae and EOM are normal  Nose: without d/c or deformity               Neck: Neck supple. Gross normal ROM               Cardiovascular: Normal rate and regular rhythm.                 Pulmonary/Chest: Effort normal and breath sounds without rales or wheezing.                Abd:  Soft, NT, ND, + BS, no organomegaly               Neurological: Pt is alert. At baseline orientation, motor grossly intact               Skin: Skin is warm. No rashes, no other new lesions, LE edema - none               Psychiatric: Pt behavior is normal without agitation   Micro: none  Cardiac tracings I have personally interpreted today:  none  Pertinent Radiological findings (summarize): none   Lab Results  Component Value Date   WBC 7.9 04/21/2023   HGB 12.2 04/21/2023   HCT 37.2 04/21/2023   PLT 272.0 04/21/2023   GLUCOSE 96 04/21/2023   CHOL 209 (H) 04/21/2023   TRIG 182.0 (H) 04/21/2023   HDL 67.90 04/21/2023   LDLDIRECT 117.7 09/26/2013   LDLCALC 104 (H) 04/21/2023   ALT 12 04/21/2023   AST 14 04/21/2023   NA 138 04/21/2023   K 4.7 04/21/2023   CL 104 04/21/2023   CREATININE 1.73 (H) 04/21/2023   BUN 29 (H) 04/21/2023   CO2 25 04/21/2023   TSH 2.12 04/21/2023   INR 1.03 10/22/2009   HGBA1C 5.4 04/21/2023   Assessment/Plan:  Carolyn Wells is a 82 y.o. White or Caucasian [1] female with  has a past medical history of ABDOMINAL INCISIONAL HERNIA (08/13/2009), Allergic rhinitis, cause unspecified (09/28/2011), BRADYCARDIA (09/26/2010), CKD (chronic kidney disease), stage III (HCC) (02/26/2015), COLONIC POLYPS, HX OF (08/25/2007), Complication of anesthesia, DIVERTICULOSIS, COLON (08/25/2007), Dysrhythmia, GASTROESOPHAGEAL REFLUX DISEASE (08/04/2007), HYPERLIPIDEMIA (09/27/2010),  HYPERTENSION (08/06/2007), Hypothyroidism, INTERNAL HEMORRHOIDS (08/04/2007), LOW BACK PAIN (08/06/2007), OSTEOPENIA (08/04/2007), PAF (paroxysmal atrial fibrillation) (HCC), PONV (postoperative nausea and vomiting), and Solitary kidney, acquired (09/28/2011).  Encounter for well adult exam with abnormal findings Age and sex appropriate education and counseling updated with regular exercise and diet Referrals for preventative services - none needed Immunizations addressed - none needed Smoking counseling  - none needed Evidence for depression or other mood disorder - none significant Most recent labs reviewed. I have personally reviewed and have noted: 1) the patient's medical and social history 2) The patient's current medications and supplements 3) The patient's height, weight, and BMI have been recorded in the chart   Hyperlipidemia Lab Results  Component Value Date   LDLCALC 104 (H) 04/21/2023   Uncontrolled, goal ldl < 70, pt declines statin for now, cont lower chol diet   Essential hypertension BP Readings from Last 3 Encounters:  04/21/23 130/86  12/15/22 (!) 140/78  10/21/22 (!) 162/93   Stable, pt to continue medical treatment norvasc 2.5 mg qd, toprol xl 75 mg qd   CKD (chronic kidney disease), stage III (HCC) Lab Results  Component Value Date   CREATININE 1.73 (H) 04/21/2023   Stable overall, cont to avoid nephrotoxins   Hypothyroidism Lab Results  Component Value Date   TSH 2.12 04/21/2023   Stable, pt to  continue levothyroxine 50 mcg qd   Chronic low back pain Persistent, declines tramadol or pain management, cont f/u ortho as planned  Aortic atherosclerosis (HCC) Pt for lower chol diet, exercise, declines statin for now  Followup: Return in about 6 months (around 10/21/2023).  Oliver Barre, MD 04/22/2023 8:58 PM [facility]

## 2023-04-21 NOTE — Patient Instructions (Signed)

## 2023-04-21 NOTE — Telephone Encounter (Signed)
Ok to let pt know - ok to take HALF of her current dose Vit D    thanks

## 2023-04-22 ENCOUNTER — Encounter: Payer: Self-pay | Admitting: Internal Medicine

## 2023-04-22 NOTE — Assessment & Plan Note (Signed)
Lab Results  Component Value Date   TSH 2.12 04/21/2023   Stable, pt to continue levothyroxine 50 mcg qd

## 2023-04-22 NOTE — Assessment & Plan Note (Signed)
Pt for lower chol diet, exercise, declines statin for now 

## 2023-04-22 NOTE — Assessment & Plan Note (Signed)
Persistent, declines tramadol or pain management, cont f/u ortho as planned

## 2023-04-22 NOTE — Telephone Encounter (Signed)
Spoke with patient and informed her of MD response to half her dosage of vitamin D

## 2023-04-22 NOTE — Assessment & Plan Note (Signed)
Lab Results  Component Value Date   LDLCALC 104 (H) 04/21/2023   Uncontrolled, goal ldl < 70, pt declines statin for now, cont lower chol diet

## 2023-04-22 NOTE — Assessment & Plan Note (Signed)
Lab Results  Component Value Date   CREATININE 1.73 (H) 04/21/2023   Stable overall, cont to avoid nephrotoxins

## 2023-04-22 NOTE — Assessment & Plan Note (Signed)
BP Readings from Last 3 Encounters:  04/21/23 130/86  12/15/22 (!) 140/78  10/21/22 (!) 162/93   Stable, pt to continue medical treatment norvasc 2.5 mg qd, toprol xl 75 mg qd

## 2023-04-22 NOTE — Assessment & Plan Note (Signed)

## 2023-05-29 ENCOUNTER — Other Ambulatory Visit: Payer: Self-pay | Admitting: Internal Medicine

## 2023-05-31 ENCOUNTER — Other Ambulatory Visit: Payer: Self-pay

## 2023-06-14 ENCOUNTER — Encounter: Payer: Self-pay | Admitting: Cardiovascular Disease

## 2023-06-14 NOTE — Progress Notes (Unsigned)
Cardiology Office Note:    Date:  06/15/2023   ID:  Carolyn Wells, DOB May 01, 1941, MRN [id number]  PCP:  Corwin Levins, MD  Cardiologist:    Delane Ginger   Electrophysiologist:  n/a  Referring MD:  Dr. Yancey Flemings  Problem List  1. Paroxysmal atrial fib  2. Hyperlipidemia 3. Essential hypertension 4. CKD - donated a kidney to her husband   Chief Complaint  Patient presents with   Atrial Fibrillation       Carolyn Wells is a 82 y.o. female with a hx of HTN, HL, atrial fib  CKD stage 3 (solitary kidney).   CHADS2-VASc=4 (female, age 77, HTN).  This patients CHA2DS2-VASc Score and unadjusted Ischemic Stroke Rate (% per year) is equal to 4.8 % stroke rate/year from a score of 4 Above score calculated as 1 point each if present [CHF, HTN, DM, Vascular=MI/PAD/Aortic Plaque, Age if 65-74, or Female] Above score calculated as 2 points each if present [Age > 75, or Stroke/TIA/TE]  Mar 24, 2016:  Feels very well. No symptoms.     still active.   Exercises regularly  Checks BP regularly ,  Her reading are all very good.  Is on metoprolol for rate control and mild HTN.    Nov. 20, 2017:  Carolyn Wells is doing great She remains asymptomatic. She is still in atrial ablation but cannot tell that her heart beat is irregular.  April 14, 2017:  Carolyn Wells is doing well Held her Xarelto for 7 days prior to back injection Staying active.   Rides her stationary bike   December 21, 2017: Carolyn Wells is seen today for follow-up of her atrial fibrillation.   She also has a history of hypertension and hyperlipidemia. Is on chronic anticoagulation Has a runny nose for the past 2 years .  Has had an extensive work up - no answers yet   July 04, 2018: Feels well BP is a bit elevated. Today  Has CKD Still eats lots of salt   October 27, 2019:  Carolyn Wells is seen today for follow-up of her hypertension and persistent atrial fibrillation.   Seen with daughter, Lanora Manis. She has mild chronic  kidney disease. Cannot tell that her HR is irreg.  Able to get out and exercise normally .   Has some back aches.  Rides her bike - no CP or dyspnea   Jan. 25, 2022: Carolyn Wells is seen today for follow up of her HTN, Atrial fib She is on chronic anticoagulaion She fell on her face while carrying xarelto . Lost her balance carrying too many groceries.  She needs to have a spinal injection She is at low risk for the procedure.   She may hold her Xarelto for 3 days prior to injection.  Ive routed the pre-op clearance .   Jan. 27, 2023; Carolyn Wells is seen for follow up of her HTN, Atrial fib She took a Tramadol 200 mg SR,  developed nausea, vomitting   Caused her to go into rapid AF  Was hospitalized in [place] with rapid atrial fib. Her metoprolol was increased  Is hydrating with gatorlyte  She needs to have her second cataract surgery    Aug. 6, 2024 Carolyn Wells is seen for follow up of her HTN, atrial fib Seen with daughter,  Marisue Ivan  Having back pain ,  getting back injections  Back pain is worse with standing and walking  No palpitations   Was seen by Eligha Bridegroom, NP in March. Echo showed  mild RV enlargement Was started on Lasix . She is not sure its helping  We discussed the possibility of stopping the lasix and kdur and see how she does She still eats a fair amount  of salt    Past Medical History:  Diagnosis Date   ABDOMINAL INCISIONAL HERNIA 08/13/2009   s/p repair   Allergic rhinitis, cause unspecified 09/28/2011   BRADYCARDIA 09/26/2010   CKD (chronic kidney disease), stage III (HCC) 02/26/2015   Solitary kidney   COLONIC POLYPS, HX OF 08/25/2007   Complication of anesthesia    DIVERTICULOSIS, COLON 08/25/2007   Dysrhythmia    GASTROESOPHAGEAL REFLUX DISEASE 08/04/2007   HYPERLIPIDEMIA 09/27/2010   not on Rx therapy   HYPERTENSION 08/06/2007   Hypothyroidism    INTERNAL HEMORRHOIDS 08/04/2007   LOW BACK PAIN 08/06/2007   scoliosis; DJD; gets injections   OSTEOPENIA  08/04/2007   PAF (paroxysmal atrial fibrillation) (HCC)    a.  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    Past Surgical History:  Procedure Laterality Date   LAMINECTOMY  1999   L4-5/lumbar   NEPHRECTOMY     Right   s/o incisional hernia repair     RUQ   STRABISMUS SURGERY Bilateral 04/14/2019   Procedure: STRABISMUS REPAIR BILATERAL;  Surgeon: Verne Carrow, MD;  Location: [facility];  Service: Ophthalmology;  Laterality: Bilateral;   TONSILLECTOMY      Current Medications: Outpatient Medications Prior to Visit  Medication Sig Dispense Refill   amLODipine (NORVASC) 2.5 MG tablet Take 1 tablet (2.5 mg total) by mouth in the morning and at bedtime. 180 tablet 3   Ascorbic Acid (VITAMIN C) 1000 MG tablet Take 1,000 mg by mouth daily.     furosemide (LASIX) 20 MG tablet Take 1 tablet (20 mg total) by mouth every other day. 45 tablet 3   hydrocortisone (ANUSOL-HC) 2.5 % rectal cream Place 1 Application rectally 2 (two) times daily. 30 g 1   hydrocortisone (ANUSOL-HC) 25 MG suppository Place 1 suppository (25 mg total) rectally at bedtime. 24 suppository 2   levothyroxine (SYNTHROID) 50 MCG tablet TAKE 1 TABLET BY MOUTH ONCE DAILY BEFORE  BREAKFAST.  ANNUAL  APPOINTMENT  DUE  IN  [place],  MUST  SEE  PROVIDER  FOR  FUTURE  REFILLS 90 tablet 3   LUMIGAN 0.01 % SOLN Place 1 drop into both eyes at bedtime.      metoprolol succinate (TOPROL-XL) 25 MG 24 hr tablet Take 3 tablets (75 mg total) by mouth daily. 180 tablet 2   Multiple Vitamins-Minerals (PRESERVISION AREDS 2 PO) Take 1 tablet by mouth 2 (two) times daily.     Multiple Vitamins-Minerals (WOMENS 50+ MULTI VITAMIN/MIN PO) Take 1 tablet by mouth daily.     pantoprazole (PROTONIX) 40 MG tablet TAKE 1 TABLET BY MOUTH ONCE DAILY ANNUAL  APPOINTMENT   DUE  IN  [place],  MUST  SEE  PROVIDER  FOR  FUTURE  REFILLS 90 tablet 3   potassium chloride (KLOR-CON M) 10 MEQ tablet Take 1 tablet (10 mEq total) by mouth every other day. 45 tablet 3   Rivaroxaban (XARELTO) 15 MG TABS tablet TAKE 1 TABLET BY MOUTH ONCE DAILY WITH SUPPER 90 tablet 1   VITAMIN K PO Take 100 mcg by mouth daily.  No facility-administered medications prior to visit.     Allergies:   Tramadol, Hydrocodone, and Ipratropium   Social History   Socioeconomic History   Marital status: Married    Spouse name: Not on file   Number of children: 2   Years of education: Not on file   Highest education level: Not on file  Occupational History   Occupation: retired Passenger transport manager co.    Employer: RETIRED  Tobacco Use   Smoking status: Never   Smokeless tobacco: Never  Substance and Sexual Activity   Alcohol use: No    Alcohol/week: 0.0 standard drinks of alcohol   Drug use: No   Sexual activity: Not on file  Other Topics Concern   Not on file  Social History Narrative   Husband disabled, renal failure, DM, HTN.   Originally from [place], [place] >> came to [place] 1966 (husband in Electronics engineer)   Social Determinants of Health   Financial Resource Strain: Low Risk  (02/09/2023)   Overall Financial Resource Strain (CARDIA)    Difficulty of Paying Living Expenses: Not hard at all  Food Insecurity: No Food Insecurity (02/09/2023)   Hunger Vital Sign    Worried About Running Out of Food in the Last Year: Never true    Ran Out of Food in the Last Year: Never true  Transportation Needs: No Transportation Needs (02/09/2023)   PRAPARE - Administrator, Civil Service (Medical): No    Lack of Transportation (Non-Medical): No  Physical Activity: Sufficiently Active (02/09/2023)   Exercise Vital Sign    Days of Exercise per Week: 5 days    Minutes of Exercise per Session: 30 min  Stress: No Stress Concern Present (02/09/2023)   [employer] of Occupational Health -  Occupational Stress Questionnaire    Feeling of Stress : Not at all  Social Connections: Moderately Integrated (02/09/2023)   Social Connection and Isolation Panel [NHANES]    Frequency of Communication with Friends and Family: More than three times a week    Frequency of Social Gatherings with Friends and Family: More than three times a week    Attends Religious Services: More than 4 times per year    Active Member of [employer] or Organizations: No    Attends Engineer, structural: More than 4 times per year    Marital Status: Widowed     Family History:  The patient's family history includes Alzheimer's disease in her brother; Coronary artery disease (age of onset: 17) in her mother; Diabetes in her mother and another family member; Heart attack (age of onset: 70) in her father.   ROS:   As noted in current hx.    Physical Exam: Blood pressure 138/86, pulse 82, height 5\' 6"  (1.676 m), weight 145 lb 9.6 oz (66 kg), SpO2 96%.       GEN:  Well nourished, well developed in no acute distress HEENT: Normal NECK: No JVD; No carotid bruits LYMPHATICS: No lymphadenopathy CARDIAC: irreg irregno murmurs, rubs, gallops RESPIRATORY:  Clear to auscultation without rales, wheezing or rhonchi  ABDOMEN: Soft, non-tender, non-distended MUSCULOSKELETAL:  No edema; No deformity  SKIN: Warm and dry NEUROLOGIC:  Alert and oriented x 3    ECG :     Wt Readings from Last 3 Encounters:  06/15/23 145 lb 9.6 oz (66 kg)  04/21/23 146 lb 2 oz (66.3 kg)  02/09/23 148 lb (67.1 kg)      Studies/Labs Reviewed:     Recent Labs:  04/21/2023: ALT 12; BUN 29; Creatinine, Ser 1.73; Hemoglobin 12.2; Platelets 272.0; Potassium 4.7; Sodium 138; TSH 2.12   Recent Lipid Panel    Component Value Date/Time   CHOL 209 (H) 04/21/2023 1428   TRIG 182.0 (H) 04/21/2023 1428   HDL 67.90 04/21/2023 1428   CHOLHDL 3 04/21/2023 1428   VLDL 36.4 04/21/2023 1428   LDLCALC 104 (H) 04/21/2023 1428   LDLDIRECT  117.7 09/26/2013 1420     Assessment / PLAN:      1.  Chronic atrial fibrillation:    continue anticoagulation .  HR is well controlled.    2. HTN  -blood pressure is well-controlled.  3. CKD -       5. Hyperlipidemia:   stable \    Kristeen Miss, MD  06/15/2023 11:27 AM    [facility] [address] [place], [place], [place]  [address] Phone: [phone]; Fax: [phone]

## 2023-06-15 ENCOUNTER — Encounter: Payer: Self-pay | Admitting: Cardiovascular Disease

## 2023-06-15 ENCOUNTER — Ambulatory Visit: Payer: Medicare HMO | Admitting: Cardiovascular Disease

## 2023-06-15 VITALS — BP 138/86 | HR 82 | Ht 66.0 in | Wt 145.6 lb

## 2023-06-15 DIAGNOSIS — I480 Paroxysmal atrial fibrillation: Secondary | ICD-10-CM

## 2023-06-15 DIAGNOSIS — I10 Essential (primary) hypertension: Secondary | ICD-10-CM | POA: Diagnosis not present

## 2023-06-15 NOTE — Patient Instructions (Signed)
Medication Instructions:  Your physician recommends that you continue on your current medications as directed. Please refer to the Current Medication list given to you today.  *If you need a refill on your cardiac medications before your next appointment, please call your pharmacy*   Lab Work: NONE If you have labs (blood work) drawn today and your tests are completely normal, you will receive your results only by: MyChart Message (if you have MyChart) OR A paper copy in the mail If you have any lab test that is abnormal or we need to change your treatment, we will call you to review the results.   Testing/Procedures: NONE   Follow-Up: At [facility], you and your health needs are our priority.  As part of our continuing mission to provide you with exceptional heart care, we have created designated Provider Care Teams.  These Care Teams include your primary Cardiologist (physician) and Advanced Practice Providers (APPs -  Physician Assistants and Nurse Practitioners) who all work together to provide you with the care you need, when you need it.  We recommend signing up for the patient portal called "MyChart".  Sign up information is provided on this After Visit Summary.  MyChart is used to connect with patients for Virtual Visits (Telemedicine).  Patients are able to view lab/test results, encounter notes, upcoming appointments, etc.  Non-urgent messages can be sent to your provider as well.   To learn more about what you can do with MyChart, go to [email or web address].    Your next appointment:   1 year(s)  Provider:   Philip Nahser, MD      

## 2023-06-27 ENCOUNTER — Other Ambulatory Visit: Payer: Self-pay

## 2023-06-27 ENCOUNTER — Emergency Department (HOSPITAL_COMMUNITY): Payer: Medicare HMO

## 2023-06-27 ENCOUNTER — Emergency Department (HOSPITAL_COMMUNITY)
Admission: EM | Admit: 2023-06-27 | Discharge: 2023-06-27 | Disposition: A | Discharge status: Home / Self Care | Payer: Medicare HMO | Attending: Emergency Medicine | Admitting: Emergency Medicine

## 2023-06-27 DIAGNOSIS — S0990XA Unspecified injury of head, initial encounter: Secondary | ICD-10-CM | POA: Diagnosis present

## 2023-06-27 DIAGNOSIS — I4891 Unspecified atrial fibrillation: Secondary | ICD-10-CM | POA: Insufficient documentation

## 2023-06-27 DIAGNOSIS — S80811A Abrasion, right lower leg, initial encounter: Secondary | ICD-10-CM | POA: Diagnosis not present

## 2023-06-27 DIAGNOSIS — Z7901 Long term (current) use of anticoagulants: Secondary | ICD-10-CM | POA: Diagnosis not present

## 2023-06-27 DIAGNOSIS — W19XXXA Unspecified fall, initial encounter: Secondary | ICD-10-CM

## 2023-06-27 DIAGNOSIS — W0110XA Fall on same level from slipping, tripping and stumbling with subsequent striking against unspecified object, initial encounter: Secondary | ICD-10-CM | POA: Insufficient documentation

## 2023-06-27 DIAGNOSIS — S0003XA Contusion of scalp, initial encounter: Secondary | ICD-10-CM | POA: Diagnosis not present

## 2023-06-27 LAB — COMPREHENSIVE METABOLIC PANEL
ALT: 17 U/L (ref 0–44)
AST: 15 U/L (ref 15–41)
Albumin: 3.6 g/dL (ref 3.5–5.0)
Alkaline Phosphatase: 77 U/L (ref 38–126)
Anion gap: 10 (ref 5–15)
BUN: 25 mg/dL — ABNORMAL HIGH (ref 8–23)
CO2: 21 mmol/L — ABNORMAL LOW (ref 22–32)
Calcium: 9.4 mg/dL (ref 8.9–10.3)
Chloride: 107 mmol/L (ref 98–111)
Creatinine, Ser: 2.11 mg/dL — ABNORMAL HIGH (ref 0.44–1.00)
GFR, Estimated: 23 mL/min — ABNORMAL LOW (ref >60)
Glucose, Bld: 113 mg/dL — ABNORMAL HIGH (ref 70–99)
Potassium: 4 mmol/L (ref 3.5–5.1)
Sodium: 138 mmol/L (ref 135–145)
Total Bilirubin: 0.7 mg/dL (ref 0.3–1.2)
Total Protein: 6.7 g/dL (ref 6.5–8.1)

## 2023-06-27 LAB — CBC
HCT: 37.4 % (ref 36.0–46.0)
Hemoglobin: 12.4 g/dL (ref 12.0–15.0)
MCH: 33.3 pg (ref 26.0–34.0)
MCHC: 33.2 g/dL (ref 30.0–36.0)
MCV: 100.5 fL — ABNORMAL HIGH (ref 80.0–100.0)
Platelets: 233 10*3/uL (ref 150–400)
RBC: 3.72 MIL/uL — ABNORMAL LOW (ref 3.87–5.11)
RDW: 13.8 % (ref 11.5–15.5)
WBC: 9.3 10*3/uL (ref 4.0–10.5)
nRBC: 0 % (ref 0.0–0.2)

## 2023-06-27 NOTE — ED Provider Notes (Signed)
[place] EMERGENCY DEPARTMENT AT [facility] Provider Note   CSN: [id number] Arrival date & time: 06/27/23  1324     History  Chief Complaint  Patient presents with   Fall   Head Injury    Carolyn Wells is a 82 y.o. female.  HPI Was trying to get into a vehicle and ended up having to step up to high and not build to pull her self up.  This caused her to lose her balance and fall backwards.  Patient reports she fell right back and hit the back of her head.  She did not get knocked out.  She reports she has a bit of a bruise but does not have bad general headache.  No nausea no vomiting.  No weakness numbness or tingling of extremities.  She did scrape her right anterior shin in the vehicle.  This wound was cleaned and dressed at home.  Takes Xarelto for atrial fibrillation.    Home Medications Prior to Admission medications   Medication Sig Start Date End Date Taking? Authorizing Provider  amLODipine (NORVASC) 2.5 MG tablet Take 1 tablet (2.5 mg total) by mouth in the morning and at bedtime. 12/15/22 12/16/23 Yes Swinyer, Zachary George, NP  Ascorbic Acid (VITAMIN C) 1000 MG tablet Take 1,000 mg by mouth daily.   Yes [provider]  furosemide (LASIX) 20 MG tablet Take 1 tablet (20 mg total) by mouth every other day. 01/26/23  Yes Swinyer, Zachary George, NP  hydrocortisone (ANUSOL-HC) 25 MG suppository Place 1 suppository (25 mg total) rectally at bedtime. 09/11/22  Yes Hilarie Fredrickson, MD  levothyroxine (SYNTHROID) 50 MCG tablet TAKE 1 TABLET BY MOUTH ONCE DAILY BEFORE  BREAKFAST.  ANNUAL  APPOINTMENT  DUE  IN  [place],  [place]  SEE  PROVIDER  FOR  FUTURE  REFILLS Patient taking differently: Take 50 mcg by mouth daily before breakfast. 05/31/23  Yes Corwin Levins, MD  LUMIGAN 0.01 % SOLN Place 1 drop into both eyes at bedtime.  08/08/15  Yes [provider]  metoprolol succinate (TOPROL-XL) 25 MG 24 hr tablet Take 3 tablets (75 mg total) by mouth daily. 03/29/23  Yes  Nahser, Deloris Ping, MD  Multiple Vitamins-Minerals (PRESERVISION AREDS 2 PO) Take 1 tablet by mouth 2 (two) times daily.   Yes [provider]  Multiple Vitamins-Minerals (WOMENS 50+ MULTI VITAMIN/MIN PO) Take 1 tablet by mouth daily.   Yes [provider]  pantoprazole (PROTONIX) 40 MG tablet TAKE 1 TABLET BY MOUTH ONCE DAILY ANNUAL  APPOINTMENT  DUE  IN  [place],  MUST  SEE  PROVIDER  FOR  FUTURE  REFILLS Patient taking differently: Take 40 mg by mouth daily. 05/31/23  Yes Corwin Levins, MD  potassium chloride (KLOR-CON M) 10 MEQ tablet Take 1 tablet (10 mEq total) by mouth every other day. 02/04/23  Yes Swinyer, Zachary George, NP  Rivaroxaban (XARELTO) 15 MG TABS tablet TAKE 1 TABLET BY MOUTH ONCE DAILY WITH SUPPER Patient taking differently: Take 15 mg by mouth daily with supper. 03/29/23  Yes Nahser, Deloris Ping, MD  VITAMIN K PO Take 100 mcg by mouth daily.   Yes [provider]      Allergies    Tramadol, Hydrocodone, and Ipratropium    Review of Systems   Review of Systems  Physical Exam Updated Vital Signs BP (!) 142/94 (BP Location: Right Arm)   Pulse (!) 102   Temp 98.1 F (36.7 C) (Oral)  Resp 16   Ht 5\' 6"  (1.676 m)   Wt 65.8 kg   SpO2 99%   BMI 23.40 kg/m  Physical Exam Constitutional:      Comments: Alert nontoxic well-nourished well-developed.  No acute distress.  HENT:     Head:     Comments: 2 cm tender hematoma right parietal occipital area.  No bleeding or abrasion.    Mouth/Throat:     Pharynx: Oropharynx is clear.  Eyes:     Extraocular Movements: Extraocular movements intact.  Cardiovascular:     Rate and Rhythm: Normal rate. Rhythm irregular.  Pulmonary:     Effort: Pulmonary effort is normal.     Breath sounds: Normal breath sounds.  Chest:     Chest wall: No tenderness.  Abdominal:     General: There is no distension.     Palpations: Abdomen is soft.     Tenderness: There is no abdominal tenderness. There is no guarding.   Musculoskeletal:     Cervical back: Neck supple.     Comments: Deep abrasion to right anterior shin approximately 6 cm.  This is a deep dermis.  He has been well cleaned and dressed.  No other deformities of extremities.  Skin:    General: Skin is warm and dry.  Neurological:     General: No focal deficit present.     Mental Status: She is oriented to person, place, and time.     Motor: No weakness.     Coordination: Coordination normal.  Psychiatric:        Mood and Affect: Mood normal.     ED Results / Procedures / Treatments   Labs (all labs ordered are listed, but only abnormal results are displayed) Labs Reviewed  COMPREHENSIVE METABOLIC PANEL - Abnormal; Notable for the following components:      Result Value   CO2 21 (*)    Glucose, Bld 113 (*)    BUN 25 (*)    Creatinine, Ser 2.11 (*)    GFR, Estimated 23 (*)    All other components within normal limits  CBC - Abnormal; Notable for the following components:   RBC 3.72 (*)    MCV 100.5 (*)    All other components within normal limits  URINALYSIS, ROUTINE W REFLEX MICROSCOPIC    EKG None  Radiology CT HEAD WO CONTRAST  Result Date: 06/27/2023 CLINICAL DATA:  Head trauma, moderate-severe; Polytrauma, blunt. EXAM: CT HEAD WITHOUT CONTRAST CT CERVICAL SPINE WITHOUT CONTRAST TECHNIQUE: Multidetector CT imaging of the head and cervical spine was performed following the standard protocol without intravenous contrast. Multiplanar CT image reconstructions of the cervical spine were also generated. RADIATION DOSE REDUCTION: This exam was performed according to the departmental dose-optimization program which includes automated exposure control, adjustment of the mA and/or kV according to patient size and/or use of iterative reconstruction technique. COMPARISON:  None Available. FINDINGS: CT HEAD FINDINGS Brain: No acute hemorrhage. Gray-white differentiation is preserved. Partially calcified extra-axial, dural-based mass along  the left paracentral lobule, measuring up to 19 x 17 mm (coronal image 44 series 5), consistent with meningioma. No hydrocephalus or extra-axial collection. No midline shift. Vascular: No hyperdense vessel or unexpected calcification. Skull: No calvarial fracture or suspicious bone lesion. Skull base is unremarkable. Sinuses/Orbits: No acute findings. Other: None. CT CERVICAL SPINE FINDINGS Alignment: Normal. Skull base and vertebrae: No acute fracture. Normal craniocervical junction. No suspicious bone lesions. Soft tissues and spinal canal: No prevertebral fluid or swelling. No visible canal hematoma.  Disc levels: Multilevel cervical spondylosis, worst at C5-6 and C6-7, where there is at least mild spinal canal stenosis. Upper chest: No acute findings. Other: None. IMPRESSION: 1. No acute intracranial abnormality. 2. No acute fracture or traumatic listhesis of the cervical spine. 3. Partially calcified extra-axial, dural-based mass along the left paracentral lobule, measuring up to 19 mm, consistent with meningioma. Electronically Signed   By: Orvan Falconer M.D.   On: 06/27/2023 14:53   CT CERVICAL SPINE WO CONTRAST  Result Date: 06/27/2023 CLINICAL DATA:  Head trauma, moderate-severe; Polytrauma, blunt. EXAM: CT HEAD WITHOUT CONTRAST CT CERVICAL SPINE WITHOUT CONTRAST TECHNIQUE: Multidetector CT imaging of the head and cervical spine was performed following the standard protocol without intravenous contrast. Multiplanar CT image reconstructions of the cervical spine were also generated. RADIATION DOSE REDUCTION: This exam was performed according to the departmental dose-optimization program which includes automated exposure control, adjustment of the mA and/or kV according to patient size and/or use of iterative reconstruction technique. COMPARISON:  None Available. FINDINGS: CT HEAD FINDINGS Brain: No acute hemorrhage. Gray-white differentiation is preserved. Partially calcified extra-axial, dural-based  mass along the left paracentral lobule, measuring up to 19 x 17 mm (coronal image 44 series 5), consistent with meningioma. No hydrocephalus or extra-axial collection. No midline shift. Vascular: No hyperdense vessel or unexpected calcification. Skull: No calvarial fracture or suspicious bone lesion. Skull base is unremarkable. Sinuses/Orbits: No acute findings. Other: None. CT CERVICAL SPINE FINDINGS Alignment: Normal. Skull base and vertebrae: No acute fracture. Normal craniocervical junction. No suspicious bone lesions. Soft tissues and spinal canal: No prevertebral fluid or swelling. No visible canal hematoma. Disc levels: Multilevel cervical spondylosis, worst at C5-6 and C6-7, where there is at least mild spinal canal stenosis. Upper chest: No acute findings. Other: None. IMPRESSION: 1. No acute intracranial abnormality. 2. No acute fracture or traumatic listhesis of the cervical spine. 3. Partially calcified extra-axial, dural-based mass along the left paracentral lobule, measuring up to 19 mm, consistent with meningioma. Electronically Signed   By: Orvan Falconer M.D.   On: 06/27/2023 14:53   DG Tibia/Fibula Right  Result Date: 06/27/2023 CLINICAL DATA:  Status post fall. On blood thinners, scraped the lower leg EXAM: RIGHT TIBIA AND FIBULA - 2 VIEW COMPARISON:  None Available. FINDINGS: No acute fracture or dislocation. No aggressive osseous lesion. Normal alignment. Soft tissue wound along the proximal anterior lower leg. No radiopaque foreign body or soft tissue emphysema. Peripheral vascular atherosclerotic disease. IMPRESSION: 1. No acute osseous injury of the right tibia and fibula. 2. Soft tissue wound along the proximal anterior lower leg. Electronically Signed   By: Elige Ko M.D.   On: 06/27/2023 14:32    Procedures Procedures    Medications Ordered in ED Medications - No data to display  ED Course/ Medical Decision Making/ A&P                                 Medical Decision  Making Amount and/or Complexity of Data Reviewed Labs: ordered. Radiology: ordered.   Patient presents as outlined with mechanical fall.  She is anticoagulant on Xarelto.  No mental status change.  Will initiate CT scan head and C-spine for advanced age with fall with mechanism for significant injury.  Patient is anticoagulated for atrial fibrillation.  Will obtain basic lab work for renal function\electrolytes and blood count.  CT head and C-spine reviewed by radiology degenerative changes but no acute injury pattern  is present.  No anemia or thrombocytopenia.  Hemoglobin 12.4 platelets 233.  Patient has waxing waning renal insufficiency level today creatinine is at 2.1 and BUN is at 25.  This is within range of prior values over the past year.  Potassium normal at 4.0.  At this time, patient is clinically well in appearance.  Stable for discharge.  Home care for deep abrasion to right anterior lower leg reviewed.  Return precautions for head injury on Xarelto reviewed.  Patient and daughter voiced understanding.        Final Clinical Impression(s) / ED Diagnoses Final diagnoses:  Fall, initial encounter  Injury of head, initial encounter  Anticoagulated  Abrasion of anterior right lower leg, initial encounter    Rx / DC Orders ED Discharge Orders     None         Arby Barrette, MD 06/27/23 1516

## 2023-06-27 NOTE — ED Triage Notes (Signed)
Pt. Stated, I fell back onto concrete and hit my head, I'm on blood thinners for A-Fib. Scrape lrt. Lower leg. No dizziness.

## 2023-06-27 NOTE — Discharge Instructions (Addendum)
1.  Apply a nonstick dressing to your wound.  You may apply antibiotic ointment as well.  Try to elevate and apply pressure to minimize bleeding and bruising. 2.  Return immediately if you have a bad headache, confusion, nausea and vomiting or other signs of more severe head injury. 3.  See your doctor for recheck in 3 to 5 days.

## 2023-06-28 ENCOUNTER — Telehealth: Payer: Self-pay

## 2023-06-28 NOTE — Transitions of Care (Post Inpatient/ED Visit) (Signed)
   06/28/2023  Name: CRYSTALEE NEUZIL MRN: [id number] DOB: 1940-12-21  Today's TOC FU Call Status: Today's TOC FU Call Status:: Unsuccessful Call (1st Attempt) Unsuccessful Call (1st Attempt) Date: 06/28/23  Attempted to reach the patient regarding the most recent Inpatient/ED visit.  Follow Up Plan: Additional outreach attempts will be made to reach the patient to complete the Transitions of Care (Post Inpatient/ED visit) call.   Signature Elisha Ponder LPN [facility] AWV Team Direct dial:  [phone]

## 2023-10-02 ENCOUNTER — Other Ambulatory Visit: Payer: Self-pay | Admitting: Cardiovascular Disease

## 2023-10-04 ENCOUNTER — Telehealth: Payer: Self-pay | Admitting: Cardiovascular Disease

## 2023-10-04 NOTE — Telephone Encounter (Signed)
*  STAT* If patient is at the pharmacy, call can be transferred to refill team.   1. Which medications need to be refilled? (please list name of each medication and dose if known) metoprolol succinate (TOPROL-XL) 25 MG 24 hr tablet   2. Which pharmacy/location (including street and city if local pharmacy) is medication to be sent to?  [id number] - [place], [place] - [address]    3. Do they need a 30 day or 90 day supply? 90  Patient is out of this medication completely

## 2023-10-18 ENCOUNTER — Other Ambulatory Visit: Payer: Self-pay | Admitting: Cardiovascular Disease

## 2023-10-18 DIAGNOSIS — I4891 Unspecified atrial fibrillation: Secondary | ICD-10-CM

## 2023-10-18 NOTE — Telephone Encounter (Signed)
Xarelto 15mg  refill request received. Pt is 82 years old, weight-65.8kg, Crea-2.11 on 06/27/23, last seen by Dr. Elease Hashimoto on 06/15/23, Diagnosis-Afib, CrCl- 20.98 mL/min; Dose is appropriate based on dosing criteria. Will send in refill to requested pharmacy.

## 2023-10-21 ENCOUNTER — Ambulatory Visit (INDEPENDENT_AMBULATORY_CARE_PROVIDER_SITE_OTHER): Payer: Medicare HMO | Admitting: Internal Medicine

## 2023-10-21 VITALS — BP 122/84 | HR 121 | Temp 98.0°F | Ht 66.0 in | Wt 142.0 lb

## 2023-10-21 DIAGNOSIS — F418 Other specified anxiety disorders: Secondary | ICD-10-CM | POA: Diagnosis not present

## 2023-10-21 DIAGNOSIS — I4891 Unspecified atrial fibrillation: Secondary | ICD-10-CM | POA: Diagnosis not present

## 2023-10-21 DIAGNOSIS — G8929 Other chronic pain: Secondary | ICD-10-CM

## 2023-10-21 DIAGNOSIS — J449 Chronic obstructive pulmonary disease, unspecified: Secondary | ICD-10-CM

## 2023-10-21 DIAGNOSIS — I10 Essential (primary) hypertension: Secondary | ICD-10-CM

## 2023-10-21 DIAGNOSIS — M545 Low back pain, unspecified: Secondary | ICD-10-CM

## 2023-10-21 DIAGNOSIS — J310 Chronic rhinitis: Secondary | ICD-10-CM

## 2023-10-21 DIAGNOSIS — Z23 Encounter for immunization: Secondary | ICD-10-CM

## 2023-10-21 DIAGNOSIS — N1831 Chronic kidney disease, stage 3a: Secondary | ICD-10-CM

## 2023-10-21 DIAGNOSIS — E7800 Pure hypercholesterolemia, unspecified: Secondary | ICD-10-CM

## 2023-10-21 MED ORDER — METOPROLOL SUCCINATE ER 100 MG PO TB24: 100.0000 mg | ORAL_TABLET | Freq: Every day | ORAL | 3 refills | Status: DC

## 2023-10-21 NOTE — Progress Notes (Signed)
Patient ID: Carolyn Wells, female   DOB: Jan 23, 1941, 82 y.o.   MRN: [id number]        Chief Complaint: follow up afib rapid, htn, ckd4, depresion anxiety, chronic lbp, rhinitis       HPI:  Carolyn Wells is a 82 y.o. female here overall doing ok, Pt denies chest pain, increased sob or doe, wheezing, orthopnea, PND, increased LE swelling, palpitations, dizziness or syncope.   Pt denies polydipsia, polyuria, or new focal neuro s/s.    Pt denies fever, night sweats, loss of appetite, or other constitutional symptoms, though did lose 3 lbs  Pt continues to have recurring LBP without change in severity, bowel or bladder change, fever, wt loss,  worsening LE pain/numbness/weakness, gait change or falls.Denies worsening depressive symptoms, suicidal ideation, or panic; has ongoing anxiety,  HR is 121 persistent today       Wt Readings from Last 3 Encounters:  10/21/23 142 lb (64.4 kg)  06/27/23 145 lb (65.8 kg)  06/15/23 145 lb 9.6 oz (66 kg)   BP Readings from Last 3 Encounters:  10/21/23 122/84  06/27/23 (!) 142/94  06/15/23 138/86         Past Medical History:  Diagnosis Date   ABDOMINAL INCISIONAL HERNIA 08/13/2009   s/p repair   Allergic rhinitis, cause unspecified 09/28/2011   BRADYCARDIA 09/26/2010   CKD (chronic kidney disease), stage III (HCC) 02/26/2015   Solitary kidney   COLONIC POLYPS, HX OF 08/25/2007   Complication of anesthesia    DIVERTICULOSIS, COLON 08/25/2007   Dysrhythmia    GASTROESOPHAGEAL REFLUX DISEASE 08/04/2007   HYPERLIPIDEMIA 09/27/2010   not on Rx therapy   HYPERTENSION 08/06/2007   Hypothyroidism    INTERNAL HEMORRHOIDS 08/04/2007   LOW BACK PAIN 08/06/2007   scoliosis; DJD; gets injections   OSTEOPENIA 08/04/2007   PAF (paroxysmal atrial fibrillation) (HCC)    a.  Echo 2/17:  Vigorous LVF, EF 65-70%, no RWMA, Ao sclerosis, trivial AI, trivial MR, trivial TR, PASP 23 mmHg   PONV (postoperative nausea and vomiting)    Solitary kidney, acquired  09/28/2011   donated R kidney to husband   Past Surgical History:  Procedure Laterality Date   LAMINECTOMY  1999   L4-5/lumbar   NEPHRECTOMY     Right   s/o incisional hernia repair     RUQ   STRABISMUS SURGERY Bilateral 04/14/2019   Procedure: STRABISMUS REPAIR BILATERAL;  Surgeon: Verne Carrow, MD;  Location: [facility];  Service: Ophthalmology;  Laterality: Bilateral;   TONSILLECTOMY      reports that she has never smoked. She has never used smokeless tobacco. She reports that she does not drink alcohol and does not use drugs. family history includes Alzheimer's disease in her brother; Coronary artery disease (age of onset: 51) in her mother; Diabetes in her mother and another family member; Heart attack (age of onset: 10) in her father. Allergies  Allergen Reactions   Tramadol Other (See Comments)    Loopy feeling   Hydrocodone Nausea Only and Other (See Comments)    lethargy   Ipratropium     Causes nasal bleeding   Current Outpatient Medications on File Prior to Visit  Medication Sig Dispense Refill   amLODipine (NORVASC) 2.5 MG tablet Take 1 tablet (2.5 mg total) by mouth in the morning and at bedtime. 180 tablet 3   Ascorbic Acid (VITAMIN C) 1000 MG tablet Take 1,000 mg by mouth daily.     furosemide (LASIX) 20 MG  tablet Take 1 tablet (20 mg total) by mouth every other day. 45 tablet 3   hydrocortisone (ANUSOL-HC) 25 MG suppository Place 1 suppository (25 mg total) rectally at bedtime. 24 suppository 2   levothyroxine (SYNTHROID) 50 MCG tablet TAKE 1 TABLET BY MOUTH ONCE DAILY BEFORE  BREAKFAST.  ANNUAL  APPOINTMENT  DUE  IN  [place],  MUST  SEE  PROVIDER  FOR  FUTURE  REFILLS (Patient taking differently: Take 50 mcg by mouth daily before breakfast.) 90 tablet 3   LUMIGAN 0.01 % SOLN Place 1 drop into both eyes at bedtime.      Multiple Vitamins-Minerals (PRESERVISION AREDS 2 PO) Take 1 tablet by mouth 2 (two) times daily.     Multiple Vitamins-Minerals (WOMENS  50+ MULTI VITAMIN/MIN PO) Take 1 tablet by mouth daily.     pantoprazole (PROTONIX) 40 MG tablet TAKE 1 TABLET BY MOUTH ONCE DAILY ANNUAL  APPOINTMENT  DUE  IN  [place],  MUST  SEE  PROVIDER  FOR  FUTURE  REFILLS (Patient taking differently: Take 40 mg by mouth daily.) 90 tablet 3   potassium chloride (KLOR-CON M) 10 MEQ tablet Take 1 tablet (10 mEq total) by mouth every other day. 45 tablet 3   Rivaroxaban (XARELTO) 15 MG TABS tablet TAKE 1 TABLET BY MOUTH ONCE DAILY WITH SUPPER 90 tablet 1   VITAMIN K PO Take 100 mcg by mouth daily.     No current facility-administered medications on file prior to visit.        ROS:  All others reviewed and negative.  Objective        PE:  BP 122/84 (BP Location: Left Arm, Patient Position: Sitting, Cuff Size: Normal)   Pulse (!) 121   Temp 98 F (36.7 C) (Oral)   Ht 5\' 6"  (1.676 m)   Wt 142 lb (64.4 kg)   SpO2 97%   BMI 22.92 kg/m                 Constitutional: Pt appears in NAD               HENT: Head: NCAT.                Right Ear: External ear normal.                 Left Ear: External ear normal.                Eyes: . Pupils are equal, round, and reactive to light. Conjunctivae and EOM are normal               Nose: without d/c or deformity               Neck: Neck supple. Gross normal ROM               Cardiovascular: Normal rate and regular rhythm.                 Pulmonary/Chest: Effort normal and breath sounds without rales or wheezing.                Abd:  Soft, NT, ND, + BS, no organomegaly               Neurological: Pt is alert. At baseline orientation, motor grossly intact               Skin: Skin is warm. No rashes, no other new lesions, LE edema - none  Psychiatric: Pt behavior is normal without agitation   Micro: none  Cardiac tracings I have personally interpreted today:  none  Pertinent Radiological findings (summarize): none   Lab Results  Component Value Date   WBC 9.3 06/27/2023   HGB 12.4 06/27/2023    HCT 37.4 06/27/2023   PLT 233 06/27/2023   GLUCOSE 113 (H) 06/27/2023   CHOL 209 (H) 04/21/2023   TRIG 182.0 (H) 04/21/2023   HDL 67.90 04/21/2023   LDLDIRECT 117.7 09/26/2013   LDLCALC 104 (H) 04/21/2023   ALT 17 06/27/2023   AST 15 06/27/2023   NA 138 06/27/2023   K 4.0 06/27/2023   CL 107 06/27/2023   CREATININE 2.11 (H) 06/27/2023   BUN 25 (H) 06/27/2023   CO2 21 (L) 06/27/2023   TSH 2.12 04/21/2023   INR 1.03 10/22/2009   HGBA1C 5.4 04/21/2023   Assessment/Plan:  Carolyn Wells is a 82 y.o. White or Caucasian [1] female with  has a past medical history of ABDOMINAL INCISIONAL HERNIA (08/13/2009), Allergic rhinitis, cause unspecified (09/28/2011), BRADYCARDIA (09/26/2010), CKD (chronic kidney disease), stage III (HCC) (02/26/2015), COLONIC POLYPS, HX OF (08/25/2007), Complication of anesthesia, DIVERTICULOSIS, COLON (08/25/2007), Dysrhythmia, GASTROESOPHAGEAL REFLUX DISEASE (08/04/2007), HYPERLIPIDEMIA (09/27/2010), HYPERTENSION (08/06/2007), Hypothyroidism, INTERNAL HEMORRHOIDS (08/04/2007), LOW BACK PAIN (08/06/2007), OSTEOPENIA (08/04/2007), PAF (paroxysmal atrial fibrillation) (HCC), PONV (postoperative nausea and vomiting), and Solitary kidney, acquired (09/28/2011).  COPD (chronic obstructive pulmonary disease) (HCC) Stable overall , continue inhaler prn  Essential hypertension BP Readings from Last 3 Encounters:  10/21/23 122/84  06/27/23 (!) 142/94  06/15/23 138/86   Stable, pt to continue medical treatment toprol xl 100 every day, amlodipine 2.5 qd   Depression with anxiety Stable overall, cont current med tx  CKD (chronic kidney disease), stage III (HCC) Lab Results  Component Value Date   CREATININE 2.11 (H) 06/27/2023   Stable overall, cont to avoid nephrotoxins,, f/u renal as planned  Atrial fibrillation, rapid (HCC) Asympt, volume stable, for increased toprol xl 100 qd  Chronic low back pain Stable overall, cont current med tx  Non-allergic  rhinitis Mild to mod, for nasacort asd,  to f/u any worsening symptoms or concerns  Followup: Return in about 4 weeks (around 11/18/2023).  Oliver Barre, MD 10/23/2023 6:37 PM [facility]

## 2023-10-21 NOTE — Patient Instructions (Signed)
Ok to change the toprol xl to 100 mg per day (you are currently taking 3 of the 25 mg )  - this is to have the heart rate better hopefully to the 50's or 60's  Please continue all other medications as before, and refills have been done if requested.  Please have the pharmacy call with any other refills you may need.  Please continue your efforts at being more active, low cholesterol diet, and weight control  Please keep your appointments with your specialists as you may have planned  Please make an Appointment to return in 1 months, or sooner if needed, and we can plan to check the lab testing then

## 2023-10-23 ENCOUNTER — Encounter: Payer: Self-pay | Admitting: Internal Medicine

## 2023-10-23 DIAGNOSIS — I4891 Unspecified atrial fibrillation: Secondary | ICD-10-CM | POA: Insufficient documentation

## 2023-10-23 NOTE — Assessment & Plan Note (Signed)
Mild to mod, for nasacort asd,  to f/u any worsening symptoms or concerns 

## 2023-10-23 NOTE — Assessment & Plan Note (Signed)
Stable overall, cont current med tx 

## 2023-10-23 NOTE — Assessment & Plan Note (Signed)
Asympt, volume stable, for increased toprol xl 100 qd

## 2023-10-23 NOTE — Assessment & Plan Note (Signed)
Stable overall, continue inhaler prn 

## 2023-10-23 NOTE — Assessment & Plan Note (Signed)
BP Readings from Last 3 Encounters:  10/21/23 122/84  06/27/23 (!) 142/94  06/15/23 138/86   Stable, pt to continue medical treatment toprol xl 100 every day, amlodipine 2.5 qd

## 2023-10-23 NOTE — Assessment & Plan Note (Signed)
Lab Results  Component Value Date   CREATININE 2.11 (H) 06/27/2023   Stable overall, cont to avoid nephrotoxins,, f/u renal as planned

## 2023-11-18 ENCOUNTER — Encounter: Payer: Self-pay | Admitting: Internal Medicine

## 2023-11-18 ENCOUNTER — Ambulatory Visit (INDEPENDENT_AMBULATORY_CARE_PROVIDER_SITE_OTHER): Payer: Medicare HMO | Admitting: Internal Medicine

## 2023-11-18 VITALS — BP 122/80 | HR 90 | Temp 97.8°F | Ht 66.0 in | Wt 142.0 lb

## 2023-11-18 DIAGNOSIS — J449 Chronic obstructive pulmonary disease, unspecified: Secondary | ICD-10-CM | POA: Diagnosis not present

## 2023-11-18 DIAGNOSIS — I4891 Unspecified atrial fibrillation: Secondary | ICD-10-CM | POA: Diagnosis not present

## 2023-11-18 DIAGNOSIS — N1831 Chronic kidney disease, stage 3a: Secondary | ICD-10-CM

## 2023-11-18 DIAGNOSIS — E039 Hypothyroidism, unspecified: Secondary | ICD-10-CM

## 2023-11-18 DIAGNOSIS — E7800 Pure hypercholesterolemia, unspecified: Secondary | ICD-10-CM | POA: Diagnosis not present

## 2023-11-18 DIAGNOSIS — R739 Hyperglycemia, unspecified: Secondary | ICD-10-CM

## 2023-11-18 DIAGNOSIS — I10 Essential (primary) hypertension: Secondary | ICD-10-CM

## 2023-11-18 DIAGNOSIS — Z0001 Encounter for general adult medical examination with abnormal findings: Secondary | ICD-10-CM

## 2023-11-18 LAB — URINALYSIS, ROUTINE W REFLEX MICROSCOPIC
Bilirubin Urine: NEGATIVE
Hgb urine dipstick: NEGATIVE
Leukocytes,Ua: NEGATIVE
Nitrite: NEGATIVE
Specific Gravity, Urine: 1.025 (ref 1.000–1.030)
Urine Glucose: NEGATIVE
Urobilinogen, UA: 0.2 (ref 0.0–1.0)
pH: 5.5 (ref 5.0–8.0)

## 2023-11-18 LAB — BASIC METABOLIC PANEL
BUN: 26 mg/dL — ABNORMAL HIGH (ref 6–23)
CO2: 25 meq/L (ref 19–32)
Calcium: 9.6 mg/dL (ref 8.4–10.5)
Chloride: 103 meq/L (ref 96–112)
Creatinine, Ser: 1.86 mg/dL — ABNORMAL HIGH (ref 0.40–1.20)
GFR: 24.75 mL/min — ABNORMAL LOW (ref >60.00)
Glucose, Bld: 96 mg/dL (ref 70–99)
Potassium: 4.9 meq/L (ref 3.5–5.1)
Sodium: 138 meq/L (ref 135–145)

## 2023-11-18 LAB — CBC WITH DIFFERENTIAL/PLATELET
Basophils Absolute: 0 10*3/uL (ref 0.0–0.1)
Basophils Relative: 0.4 % (ref 0.0–3.0)
Eosinophils Absolute: 0.1 10*3/uL (ref 0.0–0.7)
Eosinophils Relative: 1.2 % (ref 0.0–5.0)
HCT: 36.1 % (ref 36.0–46.0)
Hemoglobin: 11.9 g/dL — ABNORMAL LOW (ref 12.0–15.0)
Lymphocytes Relative: 18.4 % (ref 12.0–46.0)
Lymphs Abs: 1.5 10*3/uL (ref 0.7–4.0)
MCHC: 32.9 g/dL (ref 30.0–36.0)
MCV: 98.7 fL (ref 78.0–100.0)
Monocytes Absolute: 0.8 10*3/uL (ref 0.1–1.0)
Monocytes Relative: 10.1 % (ref 3.0–12.0)
Neutro Abs: 5.8 10*3/uL (ref 1.4–7.7)
Neutrophils Relative %: 69.9 % (ref 43.0–77.0)
Platelets: 257 10*3/uL (ref 150.0–400.0)
RBC: 3.66 Mil/uL — ABNORMAL LOW (ref 3.87–5.11)
RDW: 14.8 % (ref 11.5–15.5)
WBC: 8.2 10*3/uL (ref 4.0–10.5)

## 2023-11-18 LAB — HEPATIC FUNCTION PANEL
ALT: 17 U/L (ref 0–35)
AST: 20 U/L (ref 0–37)
Albumin: 4 g/dL (ref 3.5–5.2)
Alkaline Phosphatase: 88 U/L (ref 39–117)
Bilirubin, Direct: 0.1 mg/dL (ref 0.0–0.3)
Total Bilirubin: 0.4 mg/dL (ref 0.2–1.2)
Total Protein: 7.3 g/dL (ref 6.0–8.3)

## 2023-11-18 LAB — LIPID PANEL
Cholesterol: 233 mg/dL — ABNORMAL HIGH (ref 0–200)
HDL: 70.4 mg/dL (ref >39.00)
LDL Cholesterol: 134 mg/dL — ABNORMAL HIGH (ref 0–99)
NonHDL: 162.45
Total CHOL/HDL Ratio: 3
Triglycerides: 144 mg/dL (ref 0.0–149.0)
VLDL: 28.8 mg/dL (ref 0.0–40.0)

## 2023-11-18 LAB — TSH: TSH: 3.68 u[IU]/mL (ref 0.35–5.50)

## 2023-11-18 LAB — HEMOGLOBIN A1C: Hgb A1c MFr Bld: 6 % (ref 4.6–6.5)

## 2023-11-18 NOTE — Progress Notes (Signed)
 Patient ID: Carolyn Wells, female   DOB: 1940/11/11, 83 y.o.   MRN: [id number]         Chief Complaint:: wellness exam and copd, afib with RVR, htn, hld, low thyroid        HPI:  Carolyn Wells is a 83 y.o. female here for wellness exam; o/w up to date                        Also Pt denies chest pain, increased sob or doe, wheezing, orthopnea, PND, increased LE swelling, palpitations, dizziness or syncope.   Pt denies polydipsia, polyuria, or new focal neuro s/s.    Pt denies fever, wt loss, night sweats, loss of appetite, or other constitutional symptoms     Wt Readings from Last 3 Encounters:  11/18/23 142 lb (64.4 kg)  10/21/23 142 lb (64.4 kg)  06/27/23 145 lb (65.8 kg)   BP Readings from Last 3 Encounters:  11/18/23 122/80  10/21/23 122/84  06/27/23 (!) 142/94   Immunization History  Administered Date(s) Administered   Fluad Quad(high Dose 65+) 10/09/2020, 10/08/2021   Fluad Trivalent(High Dose 65+) 10/21/2023   Influenza Split 09/28/2011, 09/28/2012   Influenza Whole 09/10/2006, 08/25/2007, 08/30/2008, 08/13/2009, 09/26/2010   Influenza, High Dose Seasonal PF 08/27/2017, 09/16/2018   Influenza, Seasonal, Injecte, Preservative Fre 09/01/2013   Influenza,inj,Quad PF,6+ Mos 08/28/2015   PFIZER(Purple Top)SARS-COV-2 Vaccination 01/11/2020, 02/06/2020, 08/07/2020   Pneumococcal Conjugate-13 09/28/2013   Pneumococcal Polysaccharide-23 08/09/2005, 08/10/2005, 03/27/2011   Td 11/09/1989, 08/13/2009   Tdap 09/19/2019   Zoster Recombinant(Shingrix) 09/20/2019, 11/19/2019   Zoster, Live 07/28/2006  There are no preventive care reminders to display for this patient.    Past Medical History:  Diagnosis Date   ABDOMINAL INCISIONAL HERNIA 08/13/2009   s/p repair   Allergic rhinitis, cause unspecified 09/28/2011   BRADYCARDIA 09/26/2010   CKD (chronic kidney disease), stage III (HCC) 02/26/2015   Solitary kidney   COLONIC POLYPS, HX OF 08/25/2007   Complication of anesthesia     DIVERTICULOSIS, COLON 08/25/2007   Dysrhythmia    GASTROESOPHAGEAL REFLUX DISEASE 08/04/2007   HYPERLIPIDEMIA 09/27/2010   not on Rx therapy   HYPERTENSION 08/06/2007   Hypothyroidism    INTERNAL HEMORRHOIDS 08/04/2007   LOW BACK PAIN 08/06/2007   scoliosis; DJD; gets injections   OSTEOPENIA 08/04/2007   PAF (paroxysmal atrial fibrillation) (HCC)    a.  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   Past Surgical History:  Procedure Laterality Date   LAMINECTOMY  1999   L4-5/lumbar   NEPHRECTOMY     Right   s/o incisional hernia repair     RUQ   STRABISMUS SURGERY Bilateral 04/14/2019   Procedure: STRABISMUS REPAIR BILATERAL;  Surgeon: Neysa Fallow, MD;  Location: [facility];  Service: Ophthalmology;  Laterality: Bilateral;   TONSILLECTOMY      reports that she has never smoked. She has never used smokeless tobacco. She reports that she does not drink alcohol and does not use drugs. family history includes Alzheimer's disease in her brother; Coronary artery disease (age of onset: 93) in her mother; Diabetes in her mother and another family member; Heart attack (age of onset: 14) in her father. Allergies  Allergen Reactions   Tramadol  Other (See Comments)  Loopy feeling   Hydrocodone Nausea Only and Other (See Comments)    lethargy   Ipratropium     Causes nasal bleeding   Current Outpatient Medications on File Prior to Visit  Medication Sig Dispense Refill   amLODipine  (NORVASC ) 2.5 MG tablet Take 1 tablet (2.5 mg total) by mouth in the morning and at bedtime. 180 tablet 3   Ascorbic Acid (VITAMIN C) 1000 MG tablet Take 1,000 mg by mouth daily.     furosemide  (LASIX ) 20 MG tablet Take 1 tablet (20 mg total) by mouth every other day. 45 tablet 3   hydrocortisone  (ANUSOL -HC) 25 MG  suppository Place 1 suppository (25 mg total) rectally at bedtime. 24 suppository 2   levothyroxine  (SYNTHROID ) 50 MCG tablet TAKE 1 TABLET BY MOUTH ONCE DAILY BEFORE  BREAKFAST.  ANNUAL  APPOINTMENT  DUE  IN  [place],  MUST  SEE  PROVIDER  FOR  FUTURE  REFILLS (Patient taking differently: Take 50 mcg by mouth daily before breakfast.) 90 tablet 3   LUMIGAN 0.01 % SOLN Place 1 drop into both eyes at bedtime.      metoprolol  succinate (TOPROL -XL) 100 MG 24 hr tablet Take 1 tablet (100 mg total) by mouth daily. Take with or immediately following a meal. 90 tablet 3   Multiple Vitamins-Minerals (PRESERVISION AREDS 2 PO) Take 1 tablet by mouth 2 (two) times daily.     Multiple Vitamins-Minerals (WOMENS 50+ MULTI VITAMIN/MIN PO) Take 1 tablet by mouth daily.     pantoprazole  (PROTONIX ) 40 MG tablet TAKE 1 TABLET BY MOUTH ONCE DAILY ANNUAL  APPOINTMENT  DUE  IN  [place],  MUST  SEE  PROVIDER  FOR  FUTURE  REFILLS (Patient taking differently: Take 40 mg by mouth daily.) 90 tablet 3   potassium chloride  (KLOR-CON  M) 10 MEQ tablet Take 1 tablet (10 mEq total) by mouth every other day. 45 tablet 3   Rivaroxaban  (XARELTO ) 15 MG TABS tablet TAKE 1 TABLET BY MOUTH ONCE DAILY WITH SUPPER 90 tablet 1   VITAMIN K PO Take 100 mcg by mouth daily.     No current facility-administered medications on file prior to visit.        ROS:  All others reviewed and negative.  Objective        PE:  BP 122/80 (BP Location: Left Arm, Patient Position: Sitting, Cuff Size: Normal)   Pulse 90   Temp 97.8 F (36.6 C) (Oral)   Ht 5' 6 (1.676 m)   Wt 142 lb (64.4 kg)   SpO2 98%   BMI 22.92 kg/m                 Constitutional: Pt appears in NAD               HENT: Head: NCAT.                Right Ear: External ear normal.                 Left Ear: External ear normal.                Eyes: . Pupils are equal, round, and reactive to light. Conjunctivae and EOM are normal               Nose: without d/c or deformity                Neck: Neck supple. Gross normal ROM  Cardiovascular: Normal rate and regular rhythm.                 Pulmonary/Chest: Effort normal and breath sounds without rales or wheezing.                Abd:  Soft, NT, ND, + BS, no organomegaly               Neurological: Pt is alert. At baseline orientation, motor grossly intact               Skin: Skin is warm. No rashes, no other new lesions, LE edema - none               Psychiatric: Pt behavior is normal without agitation   Micro: none  Cardiac tracings I have personally interpreted today:  none  Pertinent Radiological findings (summarize): none   Lab Results  Component Value Date   WBC 8.2 11/18/2023   HGB 11.9 (L) 11/18/2023   HCT 36.1 11/18/2023   PLT 257.0 11/18/2023   GLUCOSE 96 11/18/2023   CHOL 233 (H) 11/18/2023   TRIG 144.0 11/18/2023   HDL 70.40 11/18/2023   LDLDIRECT 117.7 09/26/2013   LDLCALC 134 (H) 11/18/2023   ALT 17 11/18/2023   AST 20 11/18/2023   NA 138 11/18/2023   K 4.9 11/18/2023   CL 103 11/18/2023   CREATININE 1.86 (H) 11/18/2023   BUN 26 (H) 11/18/2023   CO2 25 11/18/2023   TSH 3.68 11/18/2023   INR 1.03 10/22/2009   HGBA1C 6.0 11/18/2023   Assessment/Plan:  Carolyn Wells is a 83 y.o. White or Caucasian [1] female with  has a past medical history of ABDOMINAL INCISIONAL HERNIA (08/13/2009), Allergic rhinitis, cause unspecified (09/28/2011), BRADYCARDIA (09/26/2010), CKD (chronic kidney disease), stage III (HCC) (02/26/2015), COLONIC POLYPS, HX OF (08/25/2007), Complication of anesthesia, DIVERTICULOSIS, COLON (08/25/2007), Dysrhythmia, GASTROESOPHAGEAL REFLUX DISEASE (08/04/2007), HYPERLIPIDEMIA (09/27/2010), HYPERTENSION (08/06/2007), Hypothyroidism, INTERNAL HEMORRHOIDS (08/04/2007), LOW BACK PAIN (08/06/2007), OSTEOPENIA (08/04/2007), PAF (paroxysmal atrial fibrillation) (HCC), PONV (postoperative nausea and vomiting), and Solitary kidney, acquired (09/28/2011).  COPD (chronic obstructive  pulmonary disease) (HCC) Stable, cont inhaler prn  Encounter for well adult exam with abnormal findings Age and sex appropriate education and counseling updated with regular exercise and diet Referrals for preventative services - none needed Immunizations addressed - none needed Smoking counseling  - none needed Evidence for depression or other mood disorder - none significant Most recent labs reviewed. I have personally reviewed and have noted: 1) the patient's medical and social history 2) The patient's current medications and supplements 3) The patient's height, weight, and BMI have been recorded in the chart   CKD (chronic kidney disease), stage III (HCC) Lab Results  Component Value Date   CREATININE 1.86 (H) 11/18/2023   Stable overall, cont to avoid nephrotoxins   Essential hypertension BP Readings from Last 3 Encounters:  11/18/23 122/80  10/21/23 122/84  06/27/23 (!) 142/94   Stable, pt to continue medical treatment norvasc  2.5 every day, toprol  xl 100 qd   Hyperlipidemia Lab Results  Component Value Date   LDLCALC 134 (H) 11/18/2023   Uncontrolled, for lower chol diet, decliens statin for now   Hypothyroidism Lab Results  Component Value Date   TSH 3.68 11/18/2023   Stable, pt to continue levothyroxine  50 mcg qd   Atrial fibrillation with RVR (HCC) Rate much improved with increased metoprolol , cont same tx  Followup: Return in about 6 months (around 05/17/2024).  Lynwood Rush, MD 11/20/2023  8:38 PM [facility]

## 2023-11-20 ENCOUNTER — Encounter: Payer: Self-pay | Admitting: Internal Medicine

## 2023-11-20 NOTE — Patient Instructions (Signed)
 Please continue all other medications as before, and refills have been done if requested.  Please have the pharmacy call with any other refills you may need.  Please continue your efforts at being more active, low cholesterol diet, and weight control.  You are otherwise up to date with prevention measures today.  Please keep your appointments with your specialists as you may have planned  Please make an Appointment to return in 6 months, or sooner if needed

## 2023-11-20 NOTE — Assessment & Plan Note (Signed)
 Rate much improved with increased metoprolol, cont same tx

## 2023-11-20 NOTE — Assessment & Plan Note (Signed)
 Lab Results  Component Value Date   CREATININE 1.86 (H) 11/18/2023   Stable overall, cont to avoid nephrotoxins

## 2023-11-20 NOTE — Assessment & Plan Note (Signed)
 Stable , cont inhaler prn

## 2023-11-20 NOTE — Assessment & Plan Note (Signed)

## 2023-11-20 NOTE — Assessment & Plan Note (Signed)
 BP Readings from Last 3 Encounters:  11/18/23 122/80  10/21/23 122/84  06/27/23 (!) 142/94   Stable, pt to continue medical treatment norvasc 2.5 every day, toprol xl 100 qd

## 2023-11-20 NOTE — Assessment & Plan Note (Signed)
 Lab Results  Component Value Date   TSH 3.68 11/18/2023   Stable, pt to continue levothyroxine 50 mcg qd

## 2023-11-20 NOTE — Assessment & Plan Note (Signed)
 Lab Results  Component Value Date   LDLCALC 134 (H) 11/18/2023   Uncontrolled, for lower chol diet, decliens statin for now

## 2023-11-23 ENCOUNTER — Telehealth: Payer: Self-pay | Admitting: Internal Medicine

## 2023-11-23 NOTE — Telephone Encounter (Signed)
 Copied from CRM [id number]. Topic: Clinical - Medication Question >> Nov 23, 2023  9:32 AM Deaijah H wrote: Reason for CRM: [facility] called in to get an ok on changing manufacture on levothyroxine  (SYNTHROID ) 50 MCG tablet / callback # [phone]

## 2023-11-24 NOTE — Telephone Encounter (Signed)
 Called and gave the okay for change.

## 2023-11-29 ENCOUNTER — Other Ambulatory Visit: Payer: Self-pay | Admitting: Nurse Practitioner

## 2024-02-24 ENCOUNTER — Encounter: Payer: Self-pay | Admitting: Internal Medicine

## 2024-02-24 ENCOUNTER — Ambulatory Visit (INDEPENDENT_AMBULATORY_CARE_PROVIDER_SITE_OTHER): Admitting: Internal Medicine

## 2024-02-24 VITALS — BP 120/80 | HR 82 | Temp 98.0°F | Ht 66.0 in | Wt 142.0 lb

## 2024-02-24 DIAGNOSIS — G629 Polyneuropathy, unspecified: Secondary | ICD-10-CM | POA: Diagnosis not present

## 2024-02-24 DIAGNOSIS — G8929 Other chronic pain: Secondary | ICD-10-CM

## 2024-02-24 DIAGNOSIS — K649 Unspecified hemorrhoids: Secondary | ICD-10-CM

## 2024-02-24 DIAGNOSIS — I10 Essential (primary) hypertension: Secondary | ICD-10-CM | POA: Diagnosis not present

## 2024-02-24 DIAGNOSIS — K921 Melena: Secondary | ICD-10-CM | POA: Diagnosis not present

## 2024-02-24 DIAGNOSIS — M545 Low back pain, unspecified: Secondary | ICD-10-CM | POA: Diagnosis not present

## 2024-02-24 LAB — HEPATIC FUNCTION PANEL
ALT: 11 U/L (ref 0–35)
AST: 14 U/L (ref 0–37)
Albumin: 4.2 g/dL (ref 3.5–5.2)
Alkaline Phosphatase: 94 U/L (ref 39–117)
Bilirubin, Direct: 0.1 mg/dL (ref 0.0–0.3)
Total Bilirubin: 0.5 mg/dL (ref 0.2–1.2)
Total Protein: 7.1 g/dL (ref 6.0–8.3)

## 2024-02-24 LAB — CBC WITH DIFFERENTIAL/PLATELET
Basophils Absolute: 0.1 10*3/uL (ref 0.0–0.1)
Basophils Relative: 0.8 % (ref 0.0–3.0)
Eosinophils Absolute: 0.1 10*3/uL (ref 0.0–0.7)
Eosinophils Relative: 1.4 % (ref 0.0–5.0)
HCT: 35.5 % — ABNORMAL LOW (ref 36.0–46.0)
Hemoglobin: 12.1 g/dL (ref 12.0–15.0)
Lymphocytes Relative: 16.5 % (ref 12.0–46.0)
Lymphs Abs: 1.2 10*3/uL (ref 0.7–4.0)
MCHC: 34.1 g/dL (ref 30.0–36.0)
MCV: 98.8 fl (ref 78.0–100.0)
Monocytes Absolute: 0.7 10*3/uL (ref 0.1–1.0)
Monocytes Relative: 9.8 % (ref 3.0–12.0)
Neutro Abs: 5.3 10*3/uL (ref 1.4–7.7)
Neutrophils Relative %: 71.5 % (ref 43.0–77.0)
Platelets: 266 10*3/uL (ref 150.0–400.0)
RBC: 3.59 Mil/uL — ABNORMAL LOW (ref 3.87–5.11)
RDW: 14.3 % (ref 11.5–15.5)
WBC: 7.5 10*3/uL (ref 4.0–10.5)

## 2024-02-24 LAB — BASIC METABOLIC PANEL WITH GFR
BUN: 22 mg/dL (ref 6–23)
CO2: 28 meq/L (ref 19–32)
Calcium: 9.6 mg/dL (ref 8.4–10.5)
Chloride: 103 meq/L (ref 96–112)
Creatinine, Ser: 1.86 mg/dL — ABNORMAL HIGH (ref 0.40–1.20)
GFR: 24.7 mL/min — ABNORMAL LOW (ref >60.00)
Glucose, Bld: 102 mg/dL — ABNORMAL HIGH (ref 70–99)
Potassium: 4.3 meq/L (ref 3.5–5.1)
Sodium: 139 meq/L (ref 135–145)

## 2024-02-24 LAB — FERRITIN: Ferritin: 59.1 ng/mL (ref 10.0–291.0)

## 2024-02-24 LAB — IBC PANEL
Iron: 61 ug/dL (ref 42–145)
Saturation Ratios: 22.7 % (ref 20.0–50.0)
TIBC: 268.8 ug/dL (ref 250.0–450.0)
Transferrin: 192 mg/dL — ABNORMAL LOW (ref 212.0–360.0)

## 2024-02-24 MED ORDER — METOPROLOL TARTRATE 25 MG PO TABS: 75.0000 mg | ORAL_TABLET | Freq: Every day | ORAL | 3 refills | Status: DC

## 2024-02-24 MED ORDER — AMITRIPTYLINE HCL 100 MG PO TABS
100.0000 mg | ORAL_TABLET | Freq: Every evening | ORAL | 1 refills | Status: AC | PRN
Start: 2024-02-24 — End: ?

## 2024-02-24 NOTE — Progress Notes (Signed)
 Patient ID: Carolyn Wells, female   DOB: 1941/05/15, 83 y.o.   MRN: [id number]        Chief Complaint: follow up dizziness, chronic lbp, hemorrhoid with bleeding, neuritic pain at bedtime       HPI:  Carolyn Wells is a 83 y.o. female here with c/o dizziness and more unsteady after metoprolol  increased to 100 mg, asks to go back to 75.  Pt continues to have recurring LBP without change in severity, bowel or bladder change, fever, wt loss,  worsening LE pain/numbness/weakness, gait change or falls.  Never started the lyrica prescribed per ortho but will follow through on th MRI scheduled may 14.  Denies worsening reflux, abd pain, dysphagia, n/v, bowel change but has had anal irritation hemorrhoid and small intermittent bleeding.  Also has burning pain to the soles of the feet , mild to mod only at bedtime, can't get to sleep.         Wt Readings from Last 3 Encounters:  02/24/24 142 lb (64.4 kg)  11/18/23 142 lb (64.4 kg)  10/21/23 142 lb (64.4 kg)   BP Readings from Last 3 Encounters:  02/24/24 120/80  11/18/23 122/80  10/21/23 122/84         Past Medical History:  Diagnosis Date   ABDOMINAL INCISIONAL HERNIA 08/13/2009   s/p repair   Allergic rhinitis, cause unspecified 09/28/2011   BRADYCARDIA 09/26/2010   CKD (chronic kidney disease), stage III (HCC) 02/26/2015   Solitary kidney   COLONIC POLYPS, HX OF 08/25/2007   Complication of anesthesia    DIVERTICULOSIS, COLON 08/25/2007   Dysrhythmia    GASTROESOPHAGEAL REFLUX DISEASE 08/04/2007   HYPERLIPIDEMIA 09/27/2010   not on Rx therapy   HYPERTENSION 08/06/2007   Hypothyroidism    INTERNAL HEMORRHOIDS 08/04/2007   LOW BACK PAIN 08/06/2007   scoliosis; DJD; gets injections   OSTEOPENIA 08/04/2007   PAF (paroxysmal atrial fibrillation) (HCC)    a.  Echo 2/17:  Vigorous LVF, EF 65-70%, no RWMA, Ao sclerosis, trivial AI, trivial MR, trivial TR, PASP 23 mmHg   PONV (postoperative nausea and vomiting)    Solitary kidney, acquired  09/28/2011   donated R kidney to husband   Past Surgical History:  Procedure Laterality Date   LAMINECTOMY  1999   L4-5/lumbar   NEPHRECTOMY     Right   s/o incisional hernia repair     RUQ   STRABISMUS SURGERY Bilateral 04/14/2019   Procedure: STRABISMUS REPAIR BILATERAL;  Surgeon: Dorothey Gate, MD;  Location: [facility];  Service: Ophthalmology;  Laterality: Bilateral;   TONSILLECTOMY      reports that she has never smoked. She has never used smokeless tobacco. She reports that she does not drink alcohol and does not use drugs. family history includes Alzheimer's disease in her brother; Coronary artery disease (age of onset: 8) in her mother; Diabetes in her mother and another family member; Heart attack (age of onset: 53) in her father. Allergies  Allergen Reactions   Tramadol  Other (See Comments)    Loopy feeling   Hydrocodone Nausea Only and Other (See Comments)    lethargy   Ipratropium     Causes nasal bleeding   Current Outpatient Medications on File Prior to Visit  Medication Sig Dispense Refill   amLODipine  (NORVASC ) 2.5 MG tablet TAKE 1 TABLET BY MOUTH IN THE MORNING AND AT BEDTIME 180 tablet 2   Ascorbic Acid (VITAMIN C) 1000 MG tablet Take 1,000 mg by mouth daily.  diazepam  (VALIUM ) 5 MG tablet SMARTSIG:1 Tablet(s) By Mouth     furosemide  (LASIX ) 20 MG tablet Take 1 tablet (20 mg total) by mouth every other day. 45 tablet 3   hydrocortisone  (ANUSOL -HC) 25 MG suppository Place 1 suppository (25 mg total) rectally at bedtime. 24 suppository 2   levothyroxine  (SYNTHROID ) 50 MCG tablet TAKE 1 TABLET BY MOUTH ONCE DAILY BEFORE  BREAKFAST.  ANNUAL  APPOINTMENT  DUE  IN  [place],  MUST  SEE  PROVIDER  FOR  FUTURE  REFILLS (Patient taking differently: Take 50 mcg by mouth daily before breakfast.) 90 tablet 3   LUMIGAN 0.01 % SOLN Place 1 drop into both eyes at bedtime.      Multiple Vitamins-Minerals (PRESERVISION AREDS 2 PO) Take 1 tablet by mouth 2 (two)  times daily.     Multiple Vitamins-Minerals (WOMENS 50+ MULTI VITAMIN/MIN PO) Take 1 tablet by mouth daily.     pantoprazole  (PROTONIX ) 40 MG tablet TAKE 1 TABLET BY MOUTH ONCE DAILY ANNUAL  APPOINTMENT  DUE  IN  [place],  MUST  SEE  PROVIDER  FOR  FUTURE  REFILLS (Patient taking differently: Take 40 mg by mouth daily.) 90 tablet 3   potassium chloride  (KLOR-CON  M) 10 MEQ tablet Take 1 tablet (10 mEq total) by mouth every other day. 45 tablet 3   Rivaroxaban  (XARELTO ) 15 MG TABS tablet TAKE 1 TABLET BY MOUTH ONCE DAILY WITH SUPPER 90 tablet 1   VITAMIN K PO Take 100 mcg by mouth daily.     No current facility-administered medications on file prior to visit.        ROS:  All others reviewed and negative.  Objective        PE:  BP 120/80 (BP Location: Right Arm, Patient Position: Sitting, Cuff Size: Normal)   Pulse 82   Temp 98 F (36.7 C) (Oral)   Ht 5\' 6"  (1.676 m)   Wt 142 lb (64.4 kg)   SpO2 96%   BMI 22.92 kg/m                 Constitutional: Pt appears in NAD               HENT: Head: NCAT.                Right Ear: External ear normal.                 Left Ear: External ear normal.                Eyes: . Pupils are equal, round, and reactive to light. Conjunctivae and EOM are normal               Nose: without d/c or deformity               Neck: Neck supple. Gross normal ROM               Cardiovascular: Normal rate and regular rhythm.                 Pulmonary/Chest: Effort normal and breath sounds without rales or wheezing.                Abd:  Soft, NT, ND, + BS, no organomegaly               Neurological: Pt is alert. At baseline orientation, motor grossly intact  Skin: Skin is warm. No rashes, no other new lesions, LE edema - none               Psychiatric: Pt behavior is normal without agitation   Micro: none  Cardiac tracings I have personally interpreted today:  none  Pertinent Radiological findings (summarize): none   Lab Results  Component Value  Date   WBC 7.5 02/24/2024   HGB 12.1 02/24/2024   HCT 35.5 (L) 02/24/2024   PLT 266.0 02/24/2024   GLUCOSE 102 (H) 02/24/2024   CHOL 233 (H) 11/18/2023   TRIG 144.0 11/18/2023   HDL 70.40 11/18/2023   LDLDIRECT 117.7 09/26/2013   LDLCALC 134 (H) 11/18/2023   ALT 11 02/24/2024   AST 14 02/24/2024   NA 139 02/24/2024   K 4.3 02/24/2024   CL 103 02/24/2024   CREATININE 1.86 (H) 02/24/2024   BUN 22 02/24/2024   CO2 28 02/24/2024   TSH 3.68 11/18/2023   INR 1.03 10/22/2009   HGBA1C 6.0 11/18/2023   Assessment/Plan:  Carolyn Wells is a 83 y.o. White or Caucasian [1] female with  has a past medical history of ABDOMINAL INCISIONAL HERNIA (08/13/2009), Allergic rhinitis, cause unspecified (09/28/2011), BRADYCARDIA (09/26/2010), CKD (chronic kidney disease), stage III (HCC) (02/26/2015), COLONIC POLYPS, HX OF (08/25/2007), Complication of anesthesia, DIVERTICULOSIS, COLON (08/25/2007), Dysrhythmia, GASTROESOPHAGEAL REFLUX DISEASE (08/04/2007), HYPERLIPIDEMIA (09/27/2010), HYPERTENSION (08/06/2007), Hypothyroidism, INTERNAL HEMORRHOIDS (08/04/2007), LOW BACK PAIN (08/06/2007), OSTEOPENIA (08/04/2007), PAF (paroxysmal atrial fibrillation) (HCC), PONV (postoperative nausea and vomiting), and Solitary kidney, acquired (09/28/2011).  Essential hypertension BP Readings from Last 3 Encounters:  02/24/24 120/80  11/18/23 122/80  10/21/23 122/84   With dizziness possible intolerance metoprolol  100 mg , pt to decreased metoprolol  to 75 mg as before,  to f/u any worsening symptoms or concerns   Chronic low back pain Worsening recently, for MRI as per ortho may 14  Hemorrhoids With recent worsening, for referral GI for further tx  Neuropathy Mild to mod, for elavil  100 at bedtime, to f/u any worsening symptoms or concerns  Followup: Return in about 6 months (around 08/25/2024).  Rosalia Colonel, MD 02/25/2024 9:03 PM [facility]

## 2024-02-24 NOTE — Patient Instructions (Signed)
 Ok to cut back the metoprolol to the 75 mg again since this was better tolerated  Please take all new medication as prescribed - the elavil at bedtime for the burning feet  Please continue all other medications as before, and refills have been done if requested.  Please have the pharmacy call with any other refills you may need.  Please keep your appointments with your specialists as you may have planned  You will be contacted regarding the referral for: Gastroenterology Dr Willy Harvest for hemorrhoids  Please go to the LAB at the blood drawing area for the tests to be done  You will be contacted by phone if any changes need to be made immediately.  Otherwise, you will receive a letter about your results with an explanation, but please check with MyChart first.  Please make an Appointment to return in 6 months, or sooner if needed  Good luck with your MRI and Spine and Scoliosis f/u on May 14

## 2024-02-25 ENCOUNTER — Encounter: Payer: Self-pay | Admitting: Internal Medicine

## 2024-02-25 ENCOUNTER — Other Ambulatory Visit: Payer: Self-pay | Admitting: Rehabilitation

## 2024-02-25 DIAGNOSIS — M47816 Spondylosis without myelopathy or radiculopathy, lumbar region: Secondary | ICD-10-CM

## 2024-02-25 DIAGNOSIS — G629 Polyneuropathy, unspecified: Secondary | ICD-10-CM | POA: Insufficient documentation

## 2024-02-25 DIAGNOSIS — M5416 Radiculopathy, lumbar region: Secondary | ICD-10-CM

## 2024-02-25 NOTE — Assessment & Plan Note (Signed)
 With recent worsening, for referral GI for further tx

## 2024-02-25 NOTE — Assessment & Plan Note (Signed)
 BP Readings from Last 3 Encounters:  02/24/24 120/80  11/18/23 122/80  10/21/23 122/84   With dizziness possible intolerance metoprolol  100 mg , pt to decreased metoprolol  to 75 mg as before,  to f/u any worsening symptoms or concerns

## 2024-02-25 NOTE — Assessment & Plan Note (Signed)
 Mild to mod, for elavil  100 at bedtime, to f/u any worsening symptoms or concerns

## 2024-02-25 NOTE — Assessment & Plan Note (Signed)
 Worsening recently, for MRI as per ortho may 14

## 2024-03-03 ENCOUNTER — Encounter: Payer: Self-pay | Admitting: Rehabilitation

## 2024-03-15 LAB — LAB REPORT - SCANNED: Creatinine, POC: 15.9 mg/dL

## 2024-03-16 ENCOUNTER — Ambulatory Visit
Admission: RE | Admit: 2024-03-16 | Discharge: 2024-03-16 | Disposition: A | Discharge status: Home / Self Care | Source: Ambulatory Visit | Attending: Rehabilitation | Admitting: Rehabilitation

## 2024-03-16 DIAGNOSIS — M5416 Radiculopathy, lumbar region: Secondary | ICD-10-CM

## 2024-03-16 DIAGNOSIS — M47816 Spondylosis without myelopathy or radiculopathy, lumbar region: Secondary | ICD-10-CM

## 2024-04-07 ENCOUNTER — Encounter: Payer: Self-pay | Admitting: Gastroenterology

## 2024-04-07 ENCOUNTER — Ambulatory Visit: Admitting: Gastroenterology

## 2024-04-07 VITALS — BP 126/80 | HR 67 | Ht 66.0 in

## 2024-04-07 DIAGNOSIS — K648 Other hemorrhoids: Secondary | ICD-10-CM | POA: Diagnosis not present

## 2024-04-07 DIAGNOSIS — I482 Chronic atrial fibrillation, unspecified: Secondary | ICD-10-CM | POA: Diagnosis not present

## 2024-04-07 DIAGNOSIS — Z7901 Long term (current) use of anticoagulants: Secondary | ICD-10-CM | POA: Diagnosis not present

## 2024-04-07 DIAGNOSIS — J449 Chronic obstructive pulmonary disease, unspecified: Secondary | ICD-10-CM

## 2024-04-07 DIAGNOSIS — I4819 Other persistent atrial fibrillation: Secondary | ICD-10-CM

## 2024-04-07 MED ORDER — HYDROCORTISONE (PERIANAL) 2.5 % EX CREA
1.0000 | TOPICAL_CREAM | Freq: Two times a day (BID) | CUTANEOUS | 0 refills | Status: AC
Start: 2024-04-07 — End: 2024-04-21

## 2024-04-07 MED ORDER — HYDROCORTISONE ACETATE 25 MG RE SUPP: 25.0000 mg | Freq: Two times a day (BID) | RECTAL | 0 refills | Status: AC

## 2024-04-07 NOTE — Patient Instructions (Addendum)
 We have sent the following medications to your pharmacy for you to pick up at your convenience: Hydrocortisone  cream, hydrocortisone  suppository   You are schedule for a hemorrhoid banding with Dr Cherryl Corona on 05/04/24 at 11:10 am  What is a sitz bath? A sitz bath is a warm soothing soak for your perineal or bottom area (area between your legs including your anus and scrotum). The soak is made up of water and baking soda (sodium bicarbonate) or salt. You can buy baking soda or salt in a drug or grocery store. How do I take a sitz bath? There are 2 ways you can take a sitz bath.  You may use: 1. A plastic sitz bath that fits onto your toilet.  2. Your bathtub at home.  Sitz baths are available in the hospital or at your local drug store. Using a plastic sitz bath on the toilet 1. Rinse the sitz bath to remove any soap or salt residue. 2. Lift the toilet seat and put the plastic sitz bath in the toilet bowl. 3. Fill the plastic sitz bath two-thirds (2/3) full with warm water, not hot water. The water temperature should be 37 Celsius to 39  Celsius or 99 Farhenheit to 102 Farhenheit. If the water feels too warm on your wrist, it is too hot to sit in. 4. Add  to 1 tablespoon (5 mL to 15 mL) of baking soda or 1 to 2 teaspoons (5 mL to 10 mL) of salt to the water in the plastic sitz bath. Swirl the water until the baking soda or salt dissolves. 5. Carefully sit down in the plastic sitz bath and soak your bottom area for 10 to 15 minutes. As you sit down, the extra water will spill into the toilet through the openings in the plastic sitz bath. 6. When finished, dry your bottom by patting the area with a clean lint-free towel. Another way to dry the area is to use a blow dryer set on low or use a hand-held fan. You could also lie down and rest as your bottom area dries. 7. Try and leave your bottom open to the air as much as possible. Use cotton underwear with no elastic along the leg holes.  Oversized boxer shorts are great. 8. Clean the plastic sitz bath each time you use it. Using your bathtub at home 1. Rinse the bathtub before using to remove any soap or salt residue. 2. Fill the bathtub with enough warm water to cover your thighs, or about 5 inches deep. 3. The temperature of the water should be warm, not hot--about 99 Fahrenheit to 102 Fahrenheit. If the water feels too warm on your wrist, it is too hot to sit in. 4. Add  cup (125 mL) of baking soda or  cup (75 mL) of salt to the bath water. Swirl the water until the baking soda or salt is dissolved. 5. Carefully enter the bath, sit down and soak your bottom area for 10 to 15 minutes. Lean backwards in the tub, rather than sitting directly on your bottom, so the water can reach the whole area. 6. When finished get out of the bath and dry your bottom by patting the area with a clean, lint-free towel. Another way to dry the area is to use a blow dryer set on low or use a hand-held  fan. You could also lie down and rest until your bottom area dries. 7. Try and leave your bottom open to the air as much  as possible. Use cotton underwear with no elastic along the leg holes. Oversized boxer shorts are great.   If your blood pressure at your visit was 140/90 or greater, please contact your primary care physician to follow up on this.  _______________________________________________________  If you are age 80 or older, your body mass index should be between 23-30. Your Body mass index is 22.92 kg/m. If this is out of the aforementioned range listed, please consider follow up with your Primary Care Provider.  If you are age 63 or younger, your body mass index should be between 19-25. Your Body mass index is 22.92 kg/m. If this is out of the aformentioned range listed, please consider follow up with your Primary Care Provider.   ________________________________________________________  The [facility] providers would like to  encourage you to use MYCHART to communicate with providers for non-urgent requests or questions.  Due to long hold times on the telephone, sending your provider a message by [facility] may be a faster and more efficient way to get a response.  Please allow 48 business hours for a response.  Please remember that this is for non-urgent requests.  _______________________________________________________  Thank you for entrusting me with your care and choosing [facility].  Bayley Luan Rumpf, PA-C

## 2024-04-07 NOTE — Progress Notes (Signed)
Notes. Thanks.

## 2024-04-07 NOTE — Progress Notes (Signed)
 Chief Complaint: Hemorrhoids Primary GI MD: Dr. Elvin Hammer  HPI: Discussed the use of AI scribe software for clinical note transcription with the patient, who gave verbal consent to proceed.  History of Present Illness Carolyn Wells is an 83 year old female who presents with worsening hemorrhoids.  She has experienced a worsening of her hemorrhoids since her last visit, with a constant feeling of fullness in the rectum, creating a persistent urge to defecate even after using the bathroom. Occasional bleeding from the hemorrhoids is noted, and the pain is significant, affecting her ability to sit or walk comfortably.  Her bowel movements are described as very soft, almost like 'ground hamburger meat'. She has previously been prescribed suppositories, which have not provided relief, and has tried various over-the-counter treatments without success. She is not currently taking any fiber supplements, although she has used them in the past.  PREVIOUS GI WORKUP   Patient underwent colonoscopy in 2008. She was found to have adenomatous and tubulovillous polyps as well as diverticulosis. She was not interested in surveillance colonoscopy as documented   Past Medical History:  Diagnosis Date   ABDOMINAL INCISIONAL HERNIA 08/13/2009   s/p repair   Allergic rhinitis, cause unspecified 09/28/2011   BRADYCARDIA 09/26/2010   CKD (chronic kidney disease), stage III (HCC) 02/26/2015   Solitary kidney   COLONIC POLYPS, HX OF 08/25/2007   Complication of anesthesia    DIVERTICULOSIS, COLON 08/25/2007   Dysrhythmia    GASTROESOPHAGEAL REFLUX DISEASE 08/04/2007   HYPERLIPIDEMIA 09/27/2010   not on Rx therapy   HYPERTENSION 08/06/2007   Hypothyroidism    INTERNAL HEMORRHOIDS 08/04/2007   LOW BACK PAIN 08/06/2007   scoliosis; DJD; gets injections   OSTEOPENIA 08/04/2007   PAF (paroxysmal atrial fibrillation) (HCC)    a.  Echo 2/17:  Vigorous LVF, EF 65-70%, no RWMA, Ao sclerosis, trivial AI, trivial MR,  trivial TR, PASP 23 mmHg   PONV (postoperative nausea and vomiting)    Solitary kidney, acquired 09/28/2011   donated R kidney to husband    Past Surgical History:  Procedure Laterality Date   LAMINECTOMY  1999   L4-5/lumbar   NEPHRECTOMY     Right   s/o incisional hernia repair     RUQ   STRABISMUS SURGERY Bilateral 04/14/2019   Procedure: STRABISMUS REPAIR BILATERAL;  Surgeon: Dorothey Gate, MD;  Location: [facility];  Service: Ophthalmology;  Laterality: Bilateral;   TONSILLECTOMY      Current Outpatient Medications  Medication Sig Dispense Refill   amLODipine  (NORVASC ) 2.5 MG tablet TAKE 1 TABLET BY MOUTH IN THE MORNING AND AT BEDTIME 180 tablet 2   Ascorbic Acid (VITAMIN C) 1000 MG tablet Take 1,000 mg by mouth daily.     furosemide  (LASIX ) 20 MG tablet Take 1 tablet (20 mg total) by mouth every other day. 45 tablet 3   hydrocortisone  (ANUSOL -HC) 2.5 % rectal cream Place 1 Application rectally 2 (two) times daily for 14 days. 42 g 0   levothyroxine  (SYNTHROID ) 50 MCG tablet TAKE 1 TABLET BY MOUTH ONCE DAILY BEFORE  BREAKFAST.  ANNUAL  APPOINTMENT  DUE  IN  [place],  MUST  SEE  PROVIDER  FOR  FUTURE  REFILLS (Patient taking differently: Take 50 mcg by mouth daily before breakfast.) 90 tablet 3   LUMIGAN 0.01 % SOLN Place 1 drop into both eyes at bedtime.      metoprolol  tartrate (LOPRESSOR ) 25 MG tablet Take 3 tablets (75 mg total) by mouth daily. 270  tablet 3   Multiple Vitamins-Minerals (PRESERVISION AREDS 2 PO) Take 1 tablet by mouth 2 (two) times daily.     Multiple Vitamins-Minerals (WOMENS 50+ MULTI VITAMIN/MIN PO) Take 1 tablet by mouth daily.     pantoprazole  (PROTONIX ) 40 MG tablet TAKE 1 TABLET BY MOUTH ONCE DAILY ANNUAL  APPOINTMENT  DUE  IN  [place],  MUST  SEE  PROVIDER  FOR  FUTURE  REFILLS (Patient taking differently: Take 40 mg by mouth daily.) 90 tablet 3   potassium chloride  (KLOR-CON  M) 10 MEQ tablet Take 1 tablet (10 mEq total) by mouth every other day.  45 tablet 3   Rivaroxaban  (XARELTO ) 15 MG TABS tablet TAKE 1 TABLET BY MOUTH ONCE DAILY WITH SUPPER 90 tablet 1   VITAMIN K PO Take 100 mcg by mouth daily.     amitriptyline  (ELAVIL ) 100 MG tablet Take 1 tablet (100 mg total) by mouth at bedtime as needed for sleep. (Patient not taking: Reported on 04/07/2024) 90 tablet 1   diazepam  (VALIUM ) 5 MG tablet SMARTSIG:1 Tablet(s) By Mouth (Patient not taking: Reported on 04/07/2024)     hydrocortisone  (ANUSOL -HC) 25 MG suppository Place 1 suppository (25 mg total) rectally 2 (two) times daily for 14 days. 28 suppository 0   No current facility-administered medications for this visit.    Allergies as of 04/07/2024 - Review Complete 04/07/2024  Allergen Reaction Noted   Tramadol  Other (See Comments) 04/15/2022   Hydrocodone Nausea Only and Other (See Comments) 08/25/2007   Ipratropium  03/29/2017    Family History  Problem Relation Age of Onset   Coronary artery disease Mother 36       1st degree relative    Diabetes Mother    Heart attack Father 62   Diabetes Other        1st degree relative   Alzheimer's disease Brother     Social History   Socioeconomic History   Marital status: Married    Spouse name: Not on file   Number of children: 2   Years of education: Not on file   Highest education level: Not on file  Occupational History   Occupation: retired Passenger transport manager co.    Employer: RETIRED  Tobacco Use   Smoking status: Never   Smokeless tobacco: Never  Substance and Sexual Activity   Alcohol use: No    Alcohol/week: 0.0 standard drinks of alcohol   Drug use: No   Sexual activity: Not on file  Other Topics Concern   Not on file  Social History Narrative   Husband disabled, renal failure, DM, HTN.   Originally from [place], [place] >> came to [place] (husband in Electronics engineer)   Social Drivers of Corporate investment banker Strain: Low Risk  (03/22/2024)   Received from [facility]   Overall Financial Resource  Strain (CARDIA)    Difficulty of Paying Living Expenses: Not hard at all  Food Insecurity: No Food Insecurity (03/22/2024)   Received from [facility]   Hunger Vital Sign    Worried About Running Out of Food in the Last Year: Never true    Ran Out of Food in the Last Year: Never true  Transportation Needs: No Transportation Needs (03/22/2024)   Received from [facility] - Transportation    Lack of Transportation (Medical): No    Lack of Transportation (Non-Medical): No  Physical Activity: Sufficiently Active (02/09/2023)   Exercise Vital Sign    Days of Exercise per Week: 5  days    Minutes of Exercise per Session: 30 min  Stress: No Stress Concern Present (02/09/2023)   [employer] of Occupational Health - Occupational Stress Questionnaire    Feeling of Stress : Not at all  Social Connections: Moderately Integrated (02/09/2023)   Social Connection and Isolation Panel [NHANES]    Frequency of Communication with Friends and Family: More than three times a week    Frequency of Social Gatherings with Friends and Family: More than three times a week    Attends Religious Services: More than 4 times per year    Active Member of [employer] or Organizations: No    Attends Engineer, structural: More than 4 times per year    Marital Status: Widowed  Intimate Partner Violence: Not At Risk (02/09/2023)   Humiliation, Afraid, Rape, and Kick questionnaire    Fear of Current or Ex-Partner: No    Emotionally Abused: No    Physically Abused: No    Sexually Abused: No    Review of Systems:    Constitutional: No weight loss, fever, chills, weakness or fatigue HEENT: Eyes: No change in vision               Ears, Nose, Throat:  No change in hearing or congestion Skin: No rash or itching Cardiovascular: No chest pain, chest pressure or palpitations   Respiratory: No SOB or cough Gastrointestinal: See HPI and otherwise negative Genitourinary: No dysuria or change in urinary  frequency Neurological: No headache, dizziness or syncope Musculoskeletal: No new muscle or joint pain Hematologic: No bleeding or bruising Psychiatric: No history of depression or anxiety    Physical Exam:  Vital signs: BP 126/80   Pulse 67   Ht 5\' 6"  (1.676 m)   BMI 22.92 kg/m   Constitutional: NAD, alert and cooperative Head:  Normocephalic and atraumatic. Eyes:   PEERL, EOMI. No icterus. Conjunctiva pink. Respiratory: Respirations even and unlabored. Lungs clear to auscultation bilaterally.   No wheezes, crackles, or rhonchi.  Cardiovascular:  Regular rate and rhythm. No peripheral edema, cyanosis or pallor.  Gastrointestinal:  Soft, nondistended, nontender. No rebound or guarding. Normal bowel sounds. No appreciable masses or hepatomegaly. Rectal:  Tisha CMA chaperone. Prolapsed internal hemorrhoids    Media Information  Document Information  Photos    04/07/2024 10:47  Attached To:  Office Visit on 04/07/24 with Garr Kalata, PA-C  Source Information  Kendyn Zaman, [place], [place]  [facility]  Document History    Msk:  Symmetrical without gross deformities. Without edema, no deformity or joint abnormality.  Neurologic:  Alert and  oriented x4;  grossly normal neurologically.  Skin:   Dry and intact without significant lesions or rashes. Psychiatric: Oriented to person, place and time. Demonstrates good judgement and reason without abnormal affect or behaviors.   RELEVANT LABS AND IMAGING: CBC    Component Value Date/Time   WBC 7.5 02/24/2024 1353   RBC 3.59 (L) 02/24/2024 1353   HGB 12.1 02/24/2024 1353   HGB 12.8 12/03/2020 1204   HCT 35.5 (L) 02/24/2024 1353   HCT 37.5 12/03/2020 1204   PLT 266.0 02/24/2024 1353   PLT 183 12/03/2020 1204   MCV 98.8 02/24/2024 1353   MCV 96 12/03/2020 1204   MCH 33.3 06/27/2023 1407   MCHC 34.1 02/24/2024 1353   RDW 14.3 02/24/2024 1353   RDW 12.2 12/03/2020 1204   LYMPHSABS 1.2 02/24/2024 1353    LYMPHSABS 1.6 12/03/2020 1204   MONOABS 0.7 02/24/2024 1353  EOSABS 0.1 02/24/2024 1353   EOSABS 0.1 12/03/2020 1204   BASOSABS 0.1 02/24/2024 1353   BASOSABS 0.0 12/03/2020 1204    CMP     Component Value Date/Time   NA 139 02/24/2024 1353   NA 141 02/02/2023 1406   K 4.3 02/24/2024 1353   CL 103 02/24/2024 1353   CO2 28 02/24/2024 1353   GLUCOSE 102 (H) 02/24/2024 1353   BUN 22 02/24/2024 1353   BUN 28 (H) 02/02/2023 1406   CREATININE 1.86 (H) 02/24/2024 1353   CREATININE 1.36 (H) 02/07/2016 0942   CALCIUM  9.6 02/24/2024 1353   PROT 7.1 02/24/2024 1353   ALBUMIN 4.2 02/24/2024 1353   AST 14 02/24/2024 1353   ALT 11 02/24/2024 1353   ALKPHOS 94 02/24/2024 1353   BILITOT 0.5 02/24/2024 1353   GFRNONAA 23 (L) 06/27/2023 1407   GFRAA 34 (L) 12/03/2020 1204     Assessment/Plan:   Internal hemorrhoids Last seen 2023 by Dr. Elvin Hammer and given hydrocrtoisone suppositories for moderate prolapsed internal hemorrhoids. Colonoscopy in 2018 with adenomatous and tubulovillous polyps with no interest in repeat exam.  Obviously prolapsed hemorrhoids today (see physical exam) with no relief from over-the-counter remedies. Rare bleeding. Having occasional fecal incontinence secondary to her hemorrhoids. - Sitz bath's - Fiber daily to optimize bowel regimen - Prescription strength hydrocortisone  cream twice daily for 14 days (suppositories will likely not stay in place with amount of prolapse) - Set up for hemorrhoid banding. By the time she gets in for banding, hemorrhoids will likely be somewhat improved.  Chronic Afib On Xarelto    HTN CKD COPD  Cami Delawder [facility] 04/07/2024, 12:20 PM  Cc: Roslyn Coombe, MD

## 2024-04-14 LAB — HM MAMMOGRAPHY

## 2024-04-19 ENCOUNTER — Encounter: Payer: Self-pay | Admitting: Internal Medicine

## 2024-04-24 NOTE — Progress Notes (Signed)
 Agree with the assessment and plan as outlined by Suzanna Erp, PA-C.  Assuming hemorrhoids manually reduce, banding is reasonable.  Slight increase risk in bleeding with Xarelto .  If hemorrhoids non-reducible, will need to be referred to surgery

## 2024-05-04 ENCOUNTER — Ambulatory Visit: Admitting: Gastroenterology

## 2024-05-04 VITALS — BP 130/80 | HR 68 | Ht 66.0 in | Wt 142.0 lb

## 2024-05-04 DIAGNOSIS — K642 Third degree hemorrhoids: Secondary | ICD-10-CM | POA: Diagnosis not present

## 2024-05-04 NOTE — Progress Notes (Signed)
 Patient presents with symptomatic hemorrhoids not responsive to conservative therapy.  Symptoms are about the same since she saw us  in clinic last month.  She thinks the creams and suppositories make her symptoms worse.  Continues to have symptoms of bleeding, prolapse, seepage and pain/itching.  Has frequent urge to defecate and sensation of incomplete evacuation  PROCEDURE NOTE: The patient presents with symptomatic grade 3  hemorrhoids, requesting rubber band ligation of his/her hemorrhoidal disease.  All risks, benefits and alternative forms of therapy were described and informed consent was obtained.  We discussed the increased risk of bleeding in patients taking anticoagulants, but the lack of clear guidance on management of anticoagulants in the setting of banding.  Given the risk of bleeding lasts for several weeks following banding, I do not recommend cessation of anticoagulation.  Perianal exam notable for prolapsed internal hemorrhoid which was manually reduced.  Prominent, beefy external skin tags also noted. The anorectum was pre-medicated with topical lidocaine  (5%) and nitroglycerin (0.125%). Anoscopic exam revealed prominent right anterior and posterior hemorrhoids, with the right anterior appearing to be most prominent. The decision was made to band the right anterior internal hemorrhoid, and the [facility] O'Regan System was used to perform band ligation without complication.  Digital anorectal examination was then performed to assure proper positioning of the band, and to adjust the banded tissue as required.  The patient was discharged home without pain or other issues.  Dietary and behavioral recommendations were given and along with follow-up instructions.     The following adjunctive treatments were recommended:  Fiber supplementation; take Metamucil every day; increase to twice daily if no change in stool consistency noted after 2 weeks Adequate water intake Avoidance of straining  and hard stools  The patient will return in 2-4 weeks for  follow-up and possible additional banding as required. No complications were encountered and the patient tolerated the procedure well.

## 2024-05-04 NOTE — Patient Instructions (Signed)

## 2024-05-06 ENCOUNTER — Other Ambulatory Visit: Payer: Self-pay | Admitting: Internal Medicine

## 2024-05-06 ENCOUNTER — Other Ambulatory Visit: Payer: Self-pay | Admitting: Cardiovascular Disease

## 2024-05-06 DIAGNOSIS — I4891 Unspecified atrial fibrillation: Secondary | ICD-10-CM

## 2024-05-08 NOTE — Telephone Encounter (Signed)
 Prescription refill request for Xarelto  received.  Indication:afib Last office visit:8/24 Weight:64.4  kg Age:83 Scr:1.86  4/25 CrCl:23.3  ml/min  Prescription refilled

## 2024-05-17 ENCOUNTER — Ambulatory Visit: Payer: Medicare HMO | Admitting: Internal Medicine

## 2024-05-20 ENCOUNTER — Other Ambulatory Visit: Payer: Self-pay | Admitting: Nurse Practitioner

## 2024-05-22 ENCOUNTER — Other Ambulatory Visit: Payer: Self-pay | Admitting: Nurse Practitioner

## 2024-05-31 ENCOUNTER — Other Ambulatory Visit: Payer: Self-pay | Admitting: Internal Medicine

## 2024-07-04 ENCOUNTER — Ambulatory Visit (INDEPENDENT_AMBULATORY_CARE_PROVIDER_SITE_OTHER): Admitting: Gastroenterology

## 2024-07-04 ENCOUNTER — Encounter: Payer: Self-pay | Admitting: Gastroenterology

## 2024-07-04 VITALS — BP 120/70 | HR 71 | Ht 66.0 in | Wt 143.0 lb

## 2024-07-04 DIAGNOSIS — K649 Unspecified hemorrhoids: Secondary | ICD-10-CM

## 2024-07-04 DIAGNOSIS — K642 Third degree hemorrhoids: Secondary | ICD-10-CM | POA: Diagnosis not present

## 2024-07-04 NOTE — Patient Instructions (Addendum)
 _______________________________________________________  If your blood pressure at your visit was 140/90 or greater, please contact your primary care physician to follow up on this.  _______________________________________________________  If you are age 84 or older, your body mass index should be between 23-30. Your Body mass index is 23.08 kg/m. If this is out of the aforementioned range listed, please consider follow up with your Primary Care Provider.  If you are age 54 or younger, your body mass index should be between 19-25. Your Body mass index is 23.08 kg/m. If this is out of the aformentioned range listed, please consider follow up with your Primary Care Provider.   ________________________________________________________  The [facility] providers would like to encourage you to use MYCHART to communicate with providers for non-urgent requests or questions.  Due to long hold times on the telephone, sending your provider a message by [facility] may be a faster and more efficient way to get a response.  Please allow 48 business hours for a response.  Please remember that this is for non-urgent requests.  _______________________________________________________  [facility] is using a team-based approach to care.  Your team is made up of your doctor and two to three APPS. Our APPS (Nurse Practitioners and Physician Assistants) work with your physician to ensure care continuity for you. They are fully qualified to address your health concerns and develop a treatment plan. They communicate directly with your gastroenterologist to care for you. Seeing the Advanced Practice Practitioners on your physician's team can help you by facilitating care more promptly, often allowing for earlier appointments, access to diagnostic testing, procedures, and other specialty referrals.   You have been scheduled for an appointment with Dr. Stacia on 08-09-24 at 340pm. Please arrive 10 minutes early  for your appointment.  It was a pleasure to see you today!  Thank you for trusting me with your gastrointestinal care!

## 2024-07-04 NOTE — Progress Notes (Signed)
 Patient tolerated her first banding well.  Denied any pain or discomfort or excessive bleeding.  She continues to have symptoms of prolapse and bleeding, although she has noted some improvement.  We discussed her bowel habits, which consist of frequent bowel movements, typically 3-4/day.  She admits to sitting on the toilet for prolonged periods, typically 15 to 20 minutes at a time.  We discussed the importance of limiting time sitting on the toilet to reduce hemorrhoid swelling. She would like to proceed with further banding.   PROCEDURE NOTE: The patient presents with symptomatic grade 3  hemorrhoids, requesting rubber band ligation of his/her hemorrhoidal disease.  All risks, benefits and alternative forms of therapy were described and informed consent was obtained.  05/04/24:  Anoscopy with prominent RA and RP hemorrhoids; Right anterior hemorrhoid banded   The anorectum was pre-medicated with topical lidocaine  (5%) and nitroglycerin (0.125%) The decision was made to band the right posterior internal hemorrhoid, and the [facility] O'Regan System was used to perform band ligation without complication.  Digital anorectal examination was then performed to assure proper positioning of the band, and to adjust the banded tissue as required.  The patient was discharged home without pain or other issues.  Dietary and behavioral recommendations were given and along with follow-up instructions.     The following adjunctive treatments were recommended:  Fiber supplementation Adequate water intake Avoidance of straining and hard stools Avoidance of prolonged toilet sitting  The patient will return in 2-4 weeks for  follow-up and possible additional banding as required. No complications were encountered and the patient tolerated the procedure well.

## 2024-07-11 LAB — LAB REPORT - SCANNED: EGFR: 28

## 2024-08-09 ENCOUNTER — Encounter: Payer: Self-pay | Admitting: Gastroenterology

## 2024-08-09 ENCOUNTER — Ambulatory Visit: Admitting: Gastroenterology

## 2024-08-09 VITALS — BP 124/72 | HR 90 | Ht 66.0 in | Wt 142.0 lb

## 2024-08-09 DIAGNOSIS — K642 Third degree hemorrhoids: Secondary | ICD-10-CM | POA: Diagnosis not present

## 2024-08-09 NOTE — Progress Notes (Unsigned)
 6/26/205:  RA 07/04/2024: RP  Patient reports some improvement in her hemorrhoid symptoms, but does continue to have recurrent hematochezia, sometimes turning the toilet water red.  No problems with the last banding.  Continues to have sensation of incomplete evacuation prompting multiple and sometimes prolonged toilet visits.  Stopped taking Metamucil even though it may have been helping. Patient is still taking Xarelto .   PROCEDURE NOTE: The patient presents with symptomatic grade 3  hemorrhoids, requesting rubber band ligation of her hemorrhoidal disease.  All risks, benefits and alternative forms of therapy were described and informed consent was obtained.  Perianal exam notable for a prolapsed hemorrhoid, which was manually reduced.  Felt to be arising from the right posterior column based on appearance and digital rectal exam. The anorectum was pre-medicated with topical lidocaine  (5%) and nitroglycerin (0.125%) The decision was made to band the right posterior internal hemorrhoid, and the [facility] System was used to perform band ligation without complication.  Digital anorectal examination was then performed to assure proper positioning of the band, and to adjust the banded tissue as required.  The patient was discharged home without pain or other issues.  Dietary and behavioral recommendations were given and along with follow-up instructions.     The following adjunctive treatments were recommended:  Fiber supplementation Adequate water intake Avoidance of straining and hard stools Reinforced importance of limiting time on toilet.  Fiber should help reduce sensation of incomplete evacuation.   The patient will return in 2-4 weeks for  follow-up and possible additional banding as required. No complications were encountered and the patient tolerated the procedure well.

## 2024-08-09 NOTE — Patient Instructions (Signed)
 HEMORRHOID BANDING PROCEDURE    FOLLOW-UP CARE   The procedure you have had should have been relatively painless since the banding of the area involved does not have nerve endings and there is no pain sensation.  The rubber band cuts off the blood supply to the hemorrhoid and the band may fall off as soon as 48 hours after the banding (the band may occasionally be seen in the toilet bowl following a bowel movement). You may notice a temporary feeling of fullness in the rectum which should respond adequately to plain Tylenol  or Motrin.  Following the banding, avoid strenuous exercise that evening and resume full activity the next day.  A sitz bath (soaking in a warm tub) or bidet is soothing, and can be useful for cleansing the area after bowel movements.     To avoid constipation, take two tablespoons of natural wheat bran, natural oat bran, flax, Benefiber or any over the counter fiber supplement and increase your water intake to 7-8 glasses daily.    Unless you have been prescribed anorectal medication, do not put anything inside your rectum for two weeks: No suppositories, enemas, fingers, etc.  Occasionally, you may have more bleeding than usual after the banding procedure.  This is often from the untreated hemorrhoids rather than the treated one.  Don't be concerned if there is a tablespoon or so of blood.  If there is more blood than this, lie flat with your bottom higher than your head and apply an ice pack to the area. If the bleeding does not stop within a half an hour or if you feel faint, call our office at [phone] or go to the emergency room.  Problems are not common; however, if there is a substantial amount of bleeding, severe pain, chills, fever or difficulty passing urine (very rare) or other problems, you should call us  at [phone] or report to the nearest emergency room.  Do not stay seated continuously for more than 2-3 hours for a day or two after the procedure.   Tighten your buttock muscles 10-15 times every two hours and take 10-15 deep breaths every 1-2 hours.  Do not spend more than a few minutes on the toilet if you cannot empty your bowel; instead re-visit the toilet at a later time.   _______________________________________________________  If your blood pressure at your visit was 140/90 or greater, please contact your primary care physician to follow up on this.  _______________________________________________________  If you are age 11 or older, your body mass index should be between 23-30. Your Body mass index is 22.92 kg/m. If this is out of the aforementioned range listed, please consider follow up with your Primary Care Provider.  If you are age 20 or younger, your body mass index should be between 19-25. Your Body mass index is 22.92 kg/m. If this is out of the aformentioned range listed, please consider follow up with your Primary Care Provider.   ________________________________________________________  The [facility] providers would like to encourage you to use MYCHART to communicate with providers for non-urgent requests or questions.  Due to long hold times on the telephone, sending your provider a message by [facility] may be a faster and more efficient way to get a response.  Please allow 48 business hours for a response.  Please remember that this is for non-urgent requests.  _______________________________________________________  [facility] is using a team-based approach to care.  Your team is made up of your doctor and two  to three APPS. Our APPS (Nurse Practitioners and Physician Assistants) work with your physician to ensure care continuity for you. They are fully qualified to address your health concerns and develop a treatment plan. They communicate directly with your gastroenterologist to care for you. Seeing the Advanced Practice Practitioners on your physician's team can help you by facilitating care more  promptly, often allowing for earlier appointments, access to diagnostic testing, procedures, and other specialty referrals.

## 2024-08-10 ENCOUNTER — Ambulatory Visit: Attending: Internal Medicine | Admitting: Internal Medicine

## 2024-08-10 ENCOUNTER — Other Ambulatory Visit: Payer: Self-pay | Admitting: *Deleted

## 2024-08-10 ENCOUNTER — Encounter: Payer: Self-pay | Admitting: Internal Medicine

## 2024-08-10 VITALS — BP 132/80 | HR 68 | Ht 66.0 in | Wt 143.0 lb

## 2024-08-10 DIAGNOSIS — Z7901 Long term (current) use of anticoagulants: Secondary | ICD-10-CM

## 2024-08-10 DIAGNOSIS — I4819 Other persistent atrial fibrillation: Secondary | ICD-10-CM

## 2024-08-10 DIAGNOSIS — I10 Essential (primary) hypertension: Secondary | ICD-10-CM | POA: Diagnosis not present

## 2024-08-10 DIAGNOSIS — R0609 Other forms of dyspnea: Secondary | ICD-10-CM

## 2024-08-10 DIAGNOSIS — N184 Chronic kidney disease, stage 4 (severe): Secondary | ICD-10-CM

## 2024-08-10 DIAGNOSIS — I700 Atherosclerosis of aorta: Secondary | ICD-10-CM

## 2024-08-10 DIAGNOSIS — R5382 Chronic fatigue, unspecified: Secondary | ICD-10-CM

## 2024-08-10 MED ORDER — AMIODARONE HCL 200 MG PO TABS: 200.0000 mg | ORAL_TABLET | Freq: Every day | ORAL | 3 refills | Status: DC

## 2024-08-10 NOTE — H&P (View-Only) (Signed)
 Cardiology Office Note   Date:  08/10/2024  ID:  Carolyn Wells, Carolyn Wells 08/29/1941, MRN [id number] PCP: [name], MD  [facility] Providers Cardiologist:  Aleene Passe, MD (Inactive)     History of Present Illness Carolyn Wells is a 83 y.o. female who presents with her daughter with a past medical history of longstanding persistent atrial fibrillation, hypertension, aortic atherosclerosis, chronic venous insufficiency, CKD 4 who presents today for annual follow-up.  She was last seen on 06/15/2023 with her daughter for hypertension and atrial fibrillation.  No changes were made at that time  Today, she states that she has had chronic fatigue especially after eating meals and especially over the past several months to a year.  She also has dyspnea on exertion even walking up her driveway and has to stop to rest.  She has not had an ischemic evaluation in 10+ years.  She does not get chest pain with her shortness of breath.  She has chronic lower extremity swelling which she takes Lasix  and this has improved her swelling.  She has been taking Xarelto  15 mg every day without any missed doses however she does not take it with food.      ROS:  Review of Systems  All other systems reviewed and are negative.   Physical Exam  Physical Exam Vitals and nursing note reviewed.  Constitutional:      Appearance: Normal appearance.  HENT:     Head: Normocephalic and atraumatic.  Eyes:     Conjunctiva/sclera: Conjunctivae normal.  Neck:     Vascular: No carotid bruit.  Cardiovascular:     Rate and Rhythm: Normal rate. Rhythm irregular.  Pulmonary:     Effort: Pulmonary effort is normal.     Breath sounds: Normal breath sounds.  Musculoskeletal:        General: No swelling or tenderness.  Skin:    Coloration: Skin is not jaundiced or pale.  Neurological:     Mental Status: She is alert.     VS:  BP 132/80 (BP Location: Right Arm, Patient Position: Sitting, Cuff  Size: Normal)   Pulse 68   Ht 5' 6 (1.676 m)   Wt 143 lb (64.9 kg)   SpO2 98%   BMI 23.08 kg/m         Wt Readings from Last 3 Encounters:  08/10/24 143 lb (64.9 kg)  08/09/24 142 lb (64.4 kg)  07/04/24 143 lb (64.9 kg)     EKG Interpretation Date/Time:  Thursday August 10 2024 11:35:00 EDT Ventricular Rate:  68 PR Interval:    QRS Duration:  88 QT Interval:  390 QTC Calculation: 414 R Axis:   -23  Text Interpretation: Atrial fibrillation Septal infarct (cited on or before 22-Oct-2009) When compared with ECG of 22-Oct-2009 15:56, Atrial fibrillation has replaced Sinus rhythm Confirmed by Kriste Hicks [id number]) on 08/10/2024 11:44:05 AM    Studies Reviewed   Echocardiogram 01/07/2023: EF 50 to 55% with low normal ejection function and no regional wall motion abnormalities Mild LVH Mildly reduced RV function and mildly increased RV wall thickness Moderately elevated pulmonary artery systolic pressure at 47.4 mmHg Moderately dilated left atrium Mildly dilated right atrium Mild MR Aortic valve calcification     Risk Assessment/Calculations   CHA2DS2-VASc Score = 5   This indicates a 7.2% annual risk of stroke. The patient's score is based upon: CHF History: 0 HTN History: 1 Diabetes History: 0 Stroke History: 0 Vascular Disease History: 1 Age Score:  2 Gender Score: 1             ASSESSMENT  Longstanding persistent atrial fibrillation CHA2DS2-VASc: 5.  Stroke prophylaxis with renally dosed Xarelto  15 mg and rate controlled on metoprolol  tartrate 75 mg daily.  Taking Xarelto  daily however not taken with food.  Has not had any attempts at rhythm control.  Most recent echo 1 year ago shows dilated atria and low normal ejection fraction. Dyspnea on exertion possibly secondary to atrial fibrillation versus CAD Chronic fatigue, possibly secondary to atrial fibrillation Chronic anticoagulation Hypertension stable on amlodipine  2.5 mg daily and  Lopressor  Hyperlipidemia CKD 4 creatinine clearance: 23 in April    Plan  Patient was advised to take Xarelto  15 mg nightly with food Will schedule a plain cardioversion after 3 weeks of taking her anticoagulation appropriately as her fatigue and dyspnea on exertion may be A-fib related   She will start taking amiodarone 200 mg daily as she has dilated atria and longstanding A-fib which may make it difficult to obtain/maintain NSR following cardioversion Continue beta-blockade Will schedule an echocardiogram to reevaluate ejection fraction Will schedule a pharmacologic nuclear stress test for ischemic evaluation  Follow up: 4 to 6 weeks posttesting     Informed Consent   Shared Decision Making/Informed Consent The risks [chest pain, shortness of breath, cardiac arrhythmias, dizziness, blood pressure fluctuations, myocardial infarction, stroke/transient ischemic attack, nausea, vomiting, allergic reaction, radiation exposure, metallic taste sensation and life-threatening complications (estimated to be 1 in 10,000)], benefits (risk stratification, diagnosing coronary artery disease, treatment guidance) and alternatives of a nuclear stress test were discussed in detail with Carolyn Wells and she agrees to proceed.  The risks (stroke, cardiac arrhythmias rarely resulting in the need for a temporary or permanent pacemaker, skin irritation or burns and complications associated with conscious sedation including aspiration, arrhythmia, respiratory failure and death), benefits (restoration of normal sinus rhythm) and alternatives of a direct current cardioversion were explained in detail to Carolyn Wells and she agrees to proceed.         Signed, Carolyn FORBES Calender, MD

## 2024-08-10 NOTE — Progress Notes (Signed)
 Cardiology Office Note   Date:  08/10/2024  ID:  Shaketha, Jeon 08/29/1941, MRN [id number] PCP: [name], MD  [facility] Providers Cardiologist:  Aleene Passe, MD (Inactive)     History of Present Illness Carolyn Wells is a 83 y.o. female who presents with her daughter with a past medical history of longstanding persistent atrial fibrillation, hypertension, aortic atherosclerosis, chronic venous insufficiency, CKD 4 who presents today for annual follow-up.  She was last seen on 06/15/2023 with her daughter for hypertension and atrial fibrillation.  No changes were made at that time  Today, she states that she has had chronic fatigue especially after eating meals and especially over the past several months to a year.  She also has dyspnea on exertion even walking up her driveway and has to stop to rest.  She has not had an ischemic evaluation in 10+ years.  She does not get chest pain with her shortness of breath.  She has chronic lower extremity swelling which she takes Lasix  and this has improved her swelling.  She has been taking Xarelto  15 mg every day without any missed doses however she does not take it with food.      ROS:  Review of Systems  All other systems reviewed and are negative.   Physical Exam  Physical Exam Vitals and nursing note reviewed.  Constitutional:      Appearance: Normal appearance.  HENT:     Head: Normocephalic and atraumatic.  Eyes:     Conjunctiva/sclera: Conjunctivae normal.  Neck:     Vascular: No carotid bruit.  Cardiovascular:     Rate and Rhythm: Normal rate. Rhythm irregular.  Pulmonary:     Effort: Pulmonary effort is normal.     Breath sounds: Normal breath sounds.  Musculoskeletal:        General: No swelling or tenderness.  Skin:    Coloration: Skin is not jaundiced or pale.  Neurological:     Mental Status: She is alert.     VS:  BP 132/80 (BP Location: Right Arm, Patient Position: Sitting, Cuff  Size: Normal)   Pulse 68   Ht 5' 6 (1.676 m)   Wt 143 lb (64.9 kg)   SpO2 98%   BMI 23.08 kg/m         Wt Readings from Last 3 Encounters:  08/10/24 143 lb (64.9 kg)  08/09/24 142 lb (64.4 kg)  07/04/24 143 lb (64.9 kg)     EKG Interpretation Date/Time:  Thursday August 10 2024 11:35:00 EDT Ventricular Rate:  68 PR Interval:    QRS Duration:  88 QT Interval:  390 QTC Calculation: 414 R Axis:   -23  Text Interpretation: Atrial fibrillation Septal infarct (cited on or before 22-Oct-2009) When compared with ECG of 22-Oct-2009 15:56, Atrial fibrillation has replaced Sinus rhythm Confirmed by Kriste Hicks [id number]) on 08/10/2024 11:44:05 AM    Studies Reviewed   Echocardiogram 01/07/2023: EF 50 to 55% with low normal ejection function and no regional wall motion abnormalities Mild LVH Mildly reduced RV function and mildly increased RV wall thickness Moderately elevated pulmonary artery systolic pressure at 47.4 mmHg Moderately dilated left atrium Mildly dilated right atrium Mild MR Aortic valve calcification     Risk Assessment/Calculations   CHA2DS2-VASc Score = 5   This indicates a 7.2% annual risk of stroke. The patient's score is based upon: CHF History: 0 HTN History: 1 Diabetes History: 0 Stroke History: 0 Vascular Disease History: 1 Age Score:  2 Gender Score: 1             ASSESSMENT  Longstanding persistent atrial fibrillation CHA2DS2-VASc: 5.  Stroke prophylaxis with renally dosed Xarelto  15 mg and rate controlled on metoprolol  tartrate 75 mg daily.  Taking Xarelto  daily however not taken with food.  Has not had any attempts at rhythm control.  Most recent echo 1 year ago shows dilated atria and low normal ejection fraction. Dyspnea on exertion possibly secondary to atrial fibrillation versus CAD Chronic fatigue, possibly secondary to atrial fibrillation Chronic anticoagulation Hypertension stable on amlodipine  2.5 mg daily and  Lopressor  Hyperlipidemia CKD 4 creatinine clearance: 23 in April    Plan  Patient was advised to take Xarelto  15 mg nightly with food Will schedule a plain cardioversion after 3 weeks of taking her anticoagulation appropriately as her fatigue and dyspnea on exertion may be A-fib related   She will start taking amiodarone 200 mg daily as she has dilated atria and longstanding A-fib which may make it difficult to obtain/maintain NSR following cardioversion Continue beta-blockade Will schedule an echocardiogram to reevaluate ejection fraction Will schedule a pharmacologic nuclear stress test for ischemic evaluation  Follow up: 4 to 6 weeks posttesting     Informed Consent   Shared Decision Making/Informed Consent The risks [chest pain, shortness of breath, cardiac arrhythmias, dizziness, blood pressure fluctuations, myocardial infarction, stroke/transient ischemic attack, nausea, vomiting, allergic reaction, radiation exposure, metallic taste sensation and life-threatening complications (estimated to be 1 in 10,000)], benefits (risk stratification, diagnosing coronary artery disease, treatment guidance) and alternatives of a nuclear stress test were discussed in detail with Ms. Hinkle and she agrees to proceed.  The risks (stroke, cardiac arrhythmias rarely resulting in the need for a temporary or permanent pacemaker, skin irritation or burns and complications associated with conscious sedation including aspiration, arrhythmia, respiratory failure and death), benefits (restoration of normal sinus rhythm) and alternatives of a direct current cardioversion were explained in detail to Ms. Mccrae and she agrees to proceed.         Signed, Emeline FORBES Calender, MD

## 2024-08-10 NOTE — Patient Instructions (Signed)
 Medication Instructions:   START AMIODARONE 200 MG ONCE DAILY  *If you need a refill on your cardiac medications before your next appointment, please call your pharmacy*   Testing/Procedures:  Your physician has requested that you have an echocardiogram. Echocardiography is a painless test that uses sound waves to create images of your heart. It provides your doctor with information about the size and shape of your heart and how well your heart's chambers and valves are working. This procedure takes approximately one hour. There are no restrictions for this procedure. Please do NOT wear cologne, perfume, aftershave, or lotions (deodorant is allowed). Please arrive 15 minutes prior to your appointment time.  Please note: We ask at that you not bring children with you during ultrasound (echo/ vascular) testing. Due to room size and safety concerns, children are not allowed in the ultrasound rooms during exams. Our front office staff cannot provide observation of children in our lobby area while testing is being conducted. An adult accompanying a patient to their appointment will only be allowed in the ultrasound room at the discretion of the ultrasound technician under special circumstances. We apologize for any inconvenience. [address]   Your physician has requested that you have a lexiscan myoview. For further information please visit [email or web address] Please follow instruction sheet, as given. [address]  Follow-Up: At [facility], you and your health needs are our priority.  As part of our continuing mission to provide you with exceptional heart care, our providers are all part of one team.  This team includes your primary Cardiologist (physician) and Advanced Practice Providers or APPs (Physician Assistants and Nurse Practitioners) who all work together to provide you with the care you need, when you need it.  Your next appointment:   4 week(s)  Provider:   EMELINE CALENDER    Other Instructions      Dear Carolyn Wells Perfect  You are scheduled for a Cardioversion on Wednesday, October 22 with Dr. FRANCYNE.  Please arrive at the [facility] (Main Entrance A) at [facility]: [address] [place], [place] [address] at 6:30 AM (This time is 1 hour(s) before your procedure to ensure your preparation).   Free valet parking service is available. You will check in at ADMITTING.   *Please Note: You will receive a call the day before your procedure to confirm the appointment time. That time may have changed from the original time based on the schedule for that day.*    DIET:  Nothing to eat or drink after midnight except a sip of water with medications  {        :   Continue taking your anticoagulant (blood thinner): Rivaroxaban  (Xarelto ).  You will need to continue this after your procedure until you are told by your provider that it is safe to stop.    LABS:  BY OCT 20TH Come to the lab at the [facility] ([address], [place], 1st Floor) between the hours of 8:00 am and 4:30 pm. You do NOT have to be fasting.  FYI:  For your safety, and to allow us  to monitor your vital signs accurately during the surgery/procedure we request: If you have artificial nails, gel coating, SNS etc, please have those removed prior to your surgery/procedure. Not having the nail coverings /polish removed may result in cancellation or delay of your surgery/procedure.  Your support person will be asked to wait in the waiting room during  your procedure.  It is OK to have someone drop you off and come back when you are ready to be discharged.  You cannot drive after the procedure and will need someone to drive you home.  Bring your insurance cards.  *Special Note: Every effort is made to have your procedure done on time. Occasionally there are emergencies that occur at the hospital that may cause delays. Please be patient if a  delay does occur.

## 2024-08-25 ENCOUNTER — Ambulatory Visit: Admitting: Internal Medicine

## 2024-08-25 ENCOUNTER — Ambulatory Visit

## 2024-08-25 ENCOUNTER — Encounter: Payer: Self-pay | Admitting: Internal Medicine

## 2024-08-25 ENCOUNTER — Telehealth: Payer: Self-pay | Admitting: Internal Medicine

## 2024-08-25 ENCOUNTER — Ambulatory Visit: Payer: Self-pay | Admitting: Internal Medicine

## 2024-08-25 VITALS — BP 118/80 | HR 75 | Temp 97.9°F | Ht 66.0 in | Wt 141.0 lb

## 2024-08-25 DIAGNOSIS — D649 Anemia, unspecified: Secondary | ICD-10-CM

## 2024-08-25 DIAGNOSIS — E7800 Pure hypercholesterolemia, unspecified: Secondary | ICD-10-CM | POA: Diagnosis not present

## 2024-08-25 DIAGNOSIS — I10 Essential (primary) hypertension: Secondary | ICD-10-CM

## 2024-08-25 DIAGNOSIS — G8929 Other chronic pain: Secondary | ICD-10-CM

## 2024-08-25 DIAGNOSIS — M545 Low back pain, unspecified: Secondary | ICD-10-CM

## 2024-08-25 DIAGNOSIS — E039 Hypothyroidism, unspecified: Secondary | ICD-10-CM

## 2024-08-25 DIAGNOSIS — J449 Chronic obstructive pulmonary disease, unspecified: Secondary | ICD-10-CM

## 2024-08-25 DIAGNOSIS — R0600 Dyspnea, unspecified: Secondary | ICD-10-CM

## 2024-08-25 DIAGNOSIS — I4819 Other persistent atrial fibrillation: Secondary | ICD-10-CM | POA: Diagnosis not present

## 2024-08-25 DIAGNOSIS — K649 Unspecified hemorrhoids: Secondary | ICD-10-CM

## 2024-08-25 DIAGNOSIS — N184 Chronic kidney disease, stage 4 (severe): Secondary | ICD-10-CM

## 2024-08-25 LAB — TSH: TSH: 3.14 u[IU]/mL (ref 0.35–5.50)

## 2024-08-25 LAB — CBC WITH DIFFERENTIAL/PLATELET
Basophils Absolute: 0 K/uL (ref 0.0–0.1)
Basophils Relative: 0.4 % (ref 0.0–3.0)
Eosinophils Absolute: 0.1 K/uL (ref 0.0–0.7)
Eosinophils Relative: 0.9 % (ref 0.0–5.0)
HCT: 29 % — ABNORMAL LOW (ref 36.0–46.0)
Hemoglobin: 9.5 g/dL — ABNORMAL LOW (ref 12.0–15.0)
Lymphocytes Relative: 6.9 % — ABNORMAL LOW (ref 12.0–46.0)
Lymphs Abs: 0.7 K/uL (ref 0.7–4.0)
MCHC: 32.9 g/dL (ref 30.0–36.0)
MCV: 97.1 fl (ref 78.0–100.0)
Monocytes Absolute: 1.1 K/uL — ABNORMAL HIGH (ref 0.1–1.0)
Monocytes Relative: 10.8 % (ref 3.0–12.0)
Neutro Abs: 8.3 K/uL — ABNORMAL HIGH (ref 1.4–7.7)
Neutrophils Relative %: 81 % — ABNORMAL HIGH (ref 43.0–77.0)
Platelets: 269 K/uL (ref 150.0–400.0)
RBC: 2.98 Mil/uL — ABNORMAL LOW (ref 3.87–5.11)
RDW: 13.6 % (ref 11.5–15.5)
WBC: 10.3 K/uL (ref 4.0–10.5)

## 2024-08-25 LAB — BASIC METABOLIC PANEL WITH GFR
BUN: 30 mg/dL — ABNORMAL HIGH (ref 6–23)
CO2: 25 meq/L (ref 19–32)
Calcium: 9 mg/dL (ref 8.4–10.5)
Chloride: 106 meq/L (ref 96–112)
Creatinine, Ser: 1.75 mg/dL — ABNORMAL HIGH (ref 0.40–1.20)
GFR: 26.48 mL/min — ABNORMAL LOW (ref >60.00)
Glucose, Bld: 97 mg/dL (ref 70–99)
Potassium: 5 meq/L (ref 3.5–5.1)
Sodium: 139 meq/L (ref 135–145)

## 2024-08-25 LAB — BRAIN NATRIURETIC PEPTIDE: Pro B Natriuretic peptide (BNP): 695 pg/mL — ABNORMAL HIGH (ref 0.0–100.0)

## 2024-08-25 NOTE — Assessment & Plan Note (Addendum)
 Transient for an hour last pm, exam benign, but for cxr given upcoming cardioversion, and cbc bmp with labs

## 2024-08-25 NOTE — Progress Notes (Unsigned)
 Patient ID: [name], female   DOB: 04/16/1941, 83 y.o.   MRN: [id number]        Chief Complaint: follow up HTN, HLD, dyspnea, copd, afib, low thyroid , dyspnea, ckd4       HPI:  Carolyn Wells is a 83 y.o. female here with c/o         For afib cardioversion next wk, then echo and stress test soon after.  Also has ongoing chronic LBP, slowing down overall as pain persists despite ESI x 2.  Husband used to see pain management.   Wt Readings from Last 3 Encounters:  08/25/24 141 lb (64 kg)  08/10/24 143 lb (64.9 kg)  08/09/24 142 lb (64.4 kg)   BP Readings from Last 3 Encounters:  08/25/24 118/80  08/10/24 132/80  08/09/24 124/72         Past Medical History:  Diagnosis Date   ABDOMINAL INCISIONAL HERNIA 08/13/2009   s/p repair   Allergic rhinitis, cause unspecified 09/28/2011   BRADYCARDIA 09/26/2010   CKD (chronic kidney disease), stage III (HCC) 02/26/2015   Solitary kidney   COLONIC POLYPS, HX OF 08/25/2007   Complication of anesthesia    DIVERTICULOSIS, COLON 08/25/2007   Dysrhythmia    GASTROESOPHAGEAL REFLUX DISEASE 08/04/2007   HYPERLIPIDEMIA 09/27/2010   not on Rx therapy   HYPERTENSION 08/06/2007   Hypothyroidism    INTERNAL HEMORRHOIDS 08/04/2007   LOW BACK PAIN 08/06/2007   scoliosis; DJD; gets injections   OSTEOPENIA 08/04/2007   PAF (paroxysmal atrial fibrillation) (HCC)    a.  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   Past Surgical History:  Procedure Laterality Date   LAMINECTOMY  1999   L4-5/lumbar   NEPHRECTOMY     Right   s/o incisional hernia repair     RUQ   STRABISMUS SURGERY Bilateral 04/14/2019   Procedure: STRABISMUS REPAIR BILATERAL;  Surgeon: Neysa Fallow, MD;  Location: [facility];  Service: Ophthalmology;  Laterality: Bilateral;    TONSILLECTOMY      reports that she has never smoked. She has never used smokeless tobacco. She reports that she does not drink alcohol and does not use drugs. family history includes Alzheimer's disease in her brother; Coronary artery disease (age of onset: 33) in her mother; Diabetes in her mother and another family member; Heart attack (age of onset: 69) in her father. Allergies  Allergen Reactions   Tramadol  Other (See Comments)    Loopy feeling   Hydrocodone Nausea Only and Other (See Comments)    lethargy   Ipratropium     Causes nasal bleeding   Current Outpatient Medications on File Prior to Visit  Medication Sig Dispense Refill   acetaminophen  (TYLENOL ) 500 MG tablet Take 500 mg by mouth every 4 (four) hours as needed.     amiodarone (PACERONE) 200 MG tablet Take 1 tablet (200 mg total) by mouth daily. (Patient not taking: Reported on 08/25/2024) 90 tablet 3   amitriptyline  (ELAVIL ) 100 MG tablet Take 1 tablet (100 mg total) by mouth at bedtime as needed for sleep. 90 tablet 1   amLODipine  (NORVASC ) 2.5 MG tablet TAKE 1 TABLET BY MOUTH IN THE MORNING AND AT BEDTIME 180 tablet 2   Ascorbic Acid (VITAMIN C) 1000 MG tablet Take 1,000 mg by  mouth daily.     furosemide  (LASIX ) 20 MG tablet Take 1 tablet (20 mg total) by mouth every other day. 45 tablet 0   levothyroxine  (SYNTHROID ) 50 MCG tablet TAKE 1 TABLET BY MOUTH ONCE DAILY BEFORE BREAKFAST . APPOINTMENT REQUIRED FOR FUTURE REFILLS 90 tablet 0   LUMIGAN 0.01 % SOLN Place 1 drop into both eyes at bedtime.      metoprolol  tartrate (LOPRESSOR ) 25 MG tablet Take 3 tablets (75 mg total) by mouth daily. 270 tablet 3   Multiple Vitamins-Minerals (PRESERVISION AREDS 2 PO) Take 1 tablet by mouth 2 (two) times daily.     Multiple Vitamins-Minerals (WOMENS 50+ MULTI VITAMIN/MIN PO) Take 1 tablet by mouth daily.     naproxen sodium (ALEVE) 220 MG tablet Take 220 mg by mouth daily as needed.     pantoprazole  (PROTONIX ) 40 MG tablet Take 1  tablet (40 mg total) by mouth daily. TAKE 1 TABLET BY MOUTH ONCE DAILY 90 tablet 3   Perfluorohexyloctane (MIEBO) 1.338 GM/ML SOLN Apply to eye.     potassium chloride  (KLOR-CON  M) 10 MEQ tablet Take 1 tablet by mouth once daily 45 tablet 0   Rivaroxaban  (XARELTO ) 15 MG TABS tablet TAKE 1 TABLET BY MOUTH ONCE DAILY WITH SUPPER 90 tablet 1   VITAMIN K PO Take 100 mcg by mouth daily.     No current facility-administered medications on file prior to visit.        ROS:  All others reviewed and negative.  Objective        PE:  BP 118/80 (BP Location: Left Arm, Patient Position: Sitting, Cuff Size: Normal)   Pulse 75   Temp 97.9 F (36.6 C) (Oral)   Ht 5' 6 (1.676 m)   Wt 141 lb (64 kg)   SpO2 99%   BMI 22.76 kg/m                 Constitutional: Pt appears in NAD               HENT: Head: NCAT.                Right Ear: External ear normal.                 Left Ear: External ear normal.                Eyes: . Pupils are equal, round, and reactive to light. Conjunctivae and EOM are normal               Nose: without d/c or deformity               Neck: Neck supple. Gross normal ROM               Cardiovascular: Normal rate and regular rhythm.                 Pulmonary/Chest: Effort normal and breath sounds without rales or wheezing.                Abd:  Soft, NT, ND, + BS, no organomegaly               Neurological: Pt is alert. At baseline orientation, motor grossly intact               Skin: Skin is warm. No rashes, no other new lesions, LE edema - ***               Psychiatric:  Pt behavior is normal without agitation   Micro: none  Cardiac tracings I have personally interpreted today:  none  Pertinent Radiological findings (summarize): none   Lab Results  Component Value Date   WBC 7.5 02/24/2024   HGB 12.1 02/24/2024   HCT 35.5 (L) 02/24/2024   PLT 266.0 02/24/2024   GLUCOSE 102 (H) 02/24/2024   CHOL 233 (H) 11/18/2023   TRIG 144.0 11/18/2023   HDL 70.40 11/18/2023    LDLDIRECT 117.7 09/26/2013   LDLCALC 134 (H) 11/18/2023   ALT 11 02/24/2024   AST 14 02/24/2024   NA 139 02/24/2024   K 4.3 02/24/2024   CL 103 02/24/2024   CREATININE 1.86 (H) 02/24/2024   BUN 22 02/24/2024   CO2 28 02/24/2024   TSH 3.68 11/18/2023   INR 1.03 10/22/2009   HGBA1C 6.0 11/18/2023   Assessment/Plan:  Carolyn Wells is a 83 y.o. White or Caucasian [1] female with  has a past medical history of ABDOMINAL INCISIONAL HERNIA (08/13/2009), Allergic rhinitis, cause unspecified (09/28/2011), BRADYCARDIA (09/26/2010), CKD (chronic kidney disease), stage III (HCC) (02/26/2015), COLONIC POLYPS, HX OF (08/25/2007), Complication of anesthesia, DIVERTICULOSIS, COLON (08/25/2007), Dysrhythmia, GASTROESOPHAGEAL REFLUX DISEASE (08/04/2007), HYPERLIPIDEMIA (09/27/2010), HYPERTENSION (08/06/2007), Hypothyroidism, INTERNAL HEMORRHOIDS (08/04/2007), LOW BACK PAIN (08/06/2007), OSTEOPENIA (08/04/2007), PAF (paroxysmal atrial fibrillation) (HCC), PONV (postoperative nausea and vomiting), and Solitary kidney, acquired (09/28/2011).  No problem-specific Assessment & Plan notes found for this encounter.  Followup: No follow-ups on file.  Lynwood Rush, MD 08/25/2024 1:14 PM [facility]

## 2024-08-25 NOTE — Assessment & Plan Note (Addendum)
 Volume and rate stable, cont lasix  20 every other day; was not able to tolerate amiodarone, f/u cardiology next wk as planned

## 2024-08-25 NOTE — Assessment & Plan Note (Signed)
 BP Readings from Last 3 Encounters:  08/25/24 118/80  08/10/24 132/80  08/09/24 124/72   Stable, pt to continue medical treatment norvasc  2.5 every day, lopressor  75 mg qd

## 2024-08-25 NOTE — Assessment & Plan Note (Signed)
 Worsening persistent now mod to severe constant, no recent falls, pt will f/u with ortho but consider pain management if pain persists despite the tx

## 2024-08-25 NOTE — Assessment & Plan Note (Signed)
 Lab Results  Component Value Date   TSH 3.68 11/18/2023   Stable, pt to continue levothyroxine 50 mcg qd

## 2024-08-25 NOTE — Assessment & Plan Note (Addendum)
 Lab Results  Component Value Date   CREATININE 1.86 (H) 02/24/2024   Stable overall, cont to avoid nephrotoxins, f/u renal as planned Ckd 4

## 2024-08-25 NOTE — Patient Instructions (Signed)
 Please continue all other medications as before, and refills have been done if requested.  Please have the pharmacy call with any other refills you may need.  Please continue your efforts at being more active, low cholesterol diet, and weight control.  Please keep your appointments with your specialists as you may have planned - back surgeon, and consider pain management if not working out  Please go to the XRAY Department in the first floor for the x-ray testing  Please go to the LAB at the blood drawing area for the tests to be done  You will be contacted by phone if any changes need to be made immediately.  Otherwise, you will receive a letter about your results with an explanation, but please check with MyChart first.  Please make an Appointment to return in 6 months, or sooner if needed

## 2024-08-25 NOTE — Telephone Encounter (Signed)
 Staff to please inform pt,   The test results show that your current treatment is OK, as the tests are essentially stable, except there is a concerning drop in the hemoglobin.  Please return to the lab to check an iron level and repeat blood count.  I will place the orders, and you should hear from the office as well.     Also please follow up asap with her GI given the ongoing bleeding hemorrhoids, which seem to be contd bleeding despite the banding

## 2024-08-25 NOTE — Assessment & Plan Note (Signed)
 Stable, not requiring tx at this time

## 2024-08-25 NOTE — Assessment & Plan Note (Signed)
 Lab Results  Component Value Date   LDLCALC 134 (H) 11/18/2023   uncontrolled, pt declines statin for now, for lower chol diet

## 2024-08-26 ENCOUNTER — Encounter: Payer: Self-pay | Admitting: Internal Medicine

## 2024-08-26 NOTE — Assessment & Plan Note (Signed)
 With recent mild intermittent bleeding, for cbc with labs, pt encouraged to f/u with GI soon

## 2024-08-29 ENCOUNTER — Other Ambulatory Visit: Payer: Self-pay | Admitting: Internal Medicine

## 2024-08-29 ENCOUNTER — Encounter: Payer: Self-pay | Admitting: Internal Medicine

## 2024-08-29 DIAGNOSIS — J9859 Other diseases of mediastinum, not elsewhere classified: Secondary | ICD-10-CM

## 2024-08-29 NOTE — Progress Notes (Signed)
 Spoke to patient and instructed them to come at 0630  and to be NPO after 0000.     Confirmed that patient will have a ride home and someone to stay with them for 24 hours after the procedure.   Medications reviewed.  Confirmed blood thinner.  Confirmed no breaks in taking blood thinner for 3+ weeks prior to procedure.

## 2024-08-29 NOTE — Telephone Encounter (Signed)
 Pt states understanding no further questions at this time.

## 2024-08-30 ENCOUNTER — Ambulatory Visit (HOSPITAL_COMMUNITY): Admitting: Anesthesiology

## 2024-08-30 ENCOUNTER — Other Ambulatory Visit: Payer: Self-pay

## 2024-08-30 ENCOUNTER — Encounter (HOSPITAL_COMMUNITY): Admission: RE | Disposition: A | Payer: Self-pay | Source: Home / Self Care | Attending: Cardiovascular Disease

## 2024-08-30 ENCOUNTER — Ambulatory Visit (HOSPITAL_COMMUNITY)
Admission: RE | Admit: 2024-08-30 | Discharge: 2024-08-30 | Disposition: A | Discharge status: Home / Self Care | Attending: Cardiovascular Disease | Admitting: Cardiovascular Disease

## 2024-08-30 DIAGNOSIS — I129 Hypertensive chronic kidney disease with stage 1 through stage 4 chronic kidney disease, or unspecified chronic kidney disease: Secondary | ICD-10-CM

## 2024-08-30 DIAGNOSIS — Z79899 Other long term (current) drug therapy: Secondary | ICD-10-CM | POA: Insufficient documentation

## 2024-08-30 DIAGNOSIS — Z7901 Long term (current) use of anticoagulants: Secondary | ICD-10-CM | POA: Diagnosis not present

## 2024-08-30 DIAGNOSIS — I1310 Hypertensive heart and chronic kidney disease without heart failure, with stage 1 through stage 4 chronic kidney disease, or unspecified chronic kidney disease: Secondary | ICD-10-CM | POA: Diagnosis not present

## 2024-08-30 DIAGNOSIS — E039 Hypothyroidism, unspecified: Secondary | ICD-10-CM

## 2024-08-30 DIAGNOSIS — N184 Chronic kidney disease, stage 4 (severe): Secondary | ICD-10-CM

## 2024-08-30 DIAGNOSIS — E785 Hyperlipidemia, unspecified: Secondary | ICD-10-CM | POA: Insufficient documentation

## 2024-08-30 DIAGNOSIS — I4811 Longstanding persistent atrial fibrillation: Secondary | ICD-10-CM | POA: Insufficient documentation

## 2024-08-30 DIAGNOSIS — I4891 Unspecified atrial fibrillation: Secondary | ICD-10-CM

## 2024-08-30 DIAGNOSIS — I4819 Other persistent atrial fibrillation: Secondary | ICD-10-CM

## 2024-08-30 DIAGNOSIS — R0609 Other forms of dyspnea: Secondary | ICD-10-CM | POA: Diagnosis not present

## 2024-08-30 HISTORY — PX: CARDIOVERSION: EP1203

## 2024-08-30 SURGERY — CARDIOVERSION (CATH LAB)
Anesthesia: General

## 2024-08-30 MED ORDER — LIDOCAINE 2% (20 MG/ML) 5 ML SYRINGE
INTRAMUSCULAR | Status: DC | PRN
  Administered 2024-08-30: 60 mg via INTRAVENOUS

## 2024-08-30 MED ORDER — SODIUM CHLORIDE 0.9 % IV SOLN: INTRAVENOUS | Status: DC

## 2024-08-30 MED ORDER — PROPOFOL 10 MG/ML IV BOLUS
INTRAVENOUS | Status: DC | PRN
Start: 2024-08-30 — End: 2024-08-30
  Administered 2024-08-30: 20 mg via INTRAVENOUS
  Administered 2024-08-30: 60 mg via INTRAVENOUS

## 2024-08-30 SURGICAL SUPPLY — 1 items: PAD DEFIB RADIO PHYSIO CONN (PAD) ×1 IMPLANT

## 2024-08-30 NOTE — Interval H&P Note (Signed)
 History and Physical Interval Note:  08/30/2024 7:35 AM  Carolyn Wells  has presented today for surgery, with the diagnosis of AFib.  The various methods of treatment have been discussed with the patient and family. After consideration of risks, benefits and other options for treatment, the patient has consented to  Procedure(s): CARDIOVERSION ([name]) as a surgical intervention.  The patient's history has been reviewed, patient examined, no change in status, stable for surgery.  I have reviewed the patient's chart and labs.  Questions were answered to the patient's satisfaction.     Carime Dinkel

## 2024-08-30 NOTE — Op Note (Signed)
 Procedure: Electrical Cardioversion Indications:  Atrial Fibrillation  Procedure Details:  Consent: Risks of procedure as well as the alternatives and risks of each were explained to the (patient/caregiver).  Consent for procedure obtained.  Time Out: Verified patient identification, verified procedure, site/side was marked, verified correct patient position, special equipment/implants available, medications/allergies/relevent history reviewed, required imaging and test results available.  Performed  Patient placed on cardiac monitor, pulse oximetry, supplemental oxygen as necessary.  Sedation given: propofol  IV, Dr. Patrisha Pacer pads placed anterior and posterior chest.  Cardioverted 1 time(s).  Cardioversion with synchronized biphasic 200J shock, converted to atrial tachycardia at 107 bpm. After second 200 J synchronized, converted to SR w frequent PACs.   Evaluation: Findings: Post procedure EKG shows: NSR with very frequent PACs and atrial couplets, brief ectopic atrial tachycardia Complications: None Patient did tolerate procedure well.  Time Spent Directly with the Patient:  30 minutes   Carolyn Wells 08/30/2024, 8:16 AM

## 2024-08-30 NOTE — Transfer of Care (Signed)
 Immediate Anesthesia Transfer of Care Note  Patient: Carolyn Wells  Procedure(s) Performed: CARDIOVERSION  Patient Location: PACU  Anesthesia Type:MAC  Level of Consciousness: drowsy  Airway & Oxygen Therapy: Patient Spontanous Breathing and Patient connected to nasal cannula oxygen  Post-op Assessment: Report given to RN and Post -op Vital signs reviewed and stable  Post vital signs: Reviewed and stable  Last Vitals:  Vitals Value Taken Time  BP    Temp    Pulse 73 08/30/24 07:59  Resp 16 08/30/24 07:59  SpO2 96 % 08/30/24 07:59  Vitals shown include unfiled device data.  Last Pain:  Vitals:   08/30/24 0708  TempSrc: Temporal         Complications: No notable events documented.

## 2024-08-30 NOTE — Anesthesia Preprocedure Evaluation (Addendum)
 Anesthesia Evaluation  Patient identified by MRN, date of birth, ID band Patient awake    Reviewed: Allergy & Precautions, NPO status , Patient's Chart, lab work & pertinent test results  Airway Mallampati: II       Dental no notable dental hx.    Pulmonary    Pulmonary exam normal        Cardiovascular hypertension, Pt. on medications and Pt. on home beta blockers Normal cardiovascular exam+ dysrhythmias Atrial Fibrillation      Neuro/Psych    GI/Hepatic ,GERD  Medicated and Controlled,,  Endo/Other  Hypothyroidism    Renal/GU Renal disease     Musculoskeletal   Abdominal   Peds  Hematology  (+) Blood dyscrasia (Xarelto ), anemia   Anesthesia Other Findings A-Fib  Reproductive/Obstetrics                              Anesthesia Physical Anesthesia Plan  ASA: 3  Anesthesia Plan: General   Post-op Pain Management:    Induction: Intravenous  PONV Risk Score and Plan: 3 and Treatment may vary due to age or medical condition  Airway Management Planned: Nasal Cannula  Additional Equipment:   Intra-op Plan:   Post-operative Plan:   Informed Consent: I have reviewed the patients History and Physical, chart, labs and discussed the procedure including the risks, benefits and alternatives for the proposed anesthesia with the patient or authorized representative who has indicated his/her understanding and acceptance.     Dental advisory given  Plan Discussed with: CRNA  Anesthesia Plan Comments:         Anesthesia Quick Evaluation

## 2024-08-31 ENCOUNTER — Encounter (HOSPITAL_COMMUNITY): Payer: Self-pay | Admitting: Cardiovascular Disease

## 2024-08-31 NOTE — Anesthesia Postprocedure Evaluation (Signed)
 Anesthesia Post Note  Patient: Carolyn Wells  Procedure(s) Performed: CARDIOVERSION     Patient location during evaluation: Cath Lab Anesthesia Type: General Level of consciousness: awake Pain management: pain level controlled Vital Signs Assessment: post-procedure vital signs reviewed and stable Respiratory status: spontaneous breathing, nonlabored ventilation and respiratory function stable Cardiovascular status: blood pressure returned to baseline and stable Postop Assessment: no apparent nausea or vomiting Anesthetic complications: no   No notable events documented.  Last Vitals:  Vitals:   08/30/24 0840 08/30/24 0845  BP: 118/63 122/71  Pulse: (!) 48 (!) 55  Resp: 14 16  Temp:    SpO2: 99% 94%    Last Pain:  Vitals:   08/30/24 0845  TempSrc:   PainSc: 0-No pain                 Lilah Mijangos P Asaiah Scarber

## 2024-09-02 ENCOUNTER — Other Ambulatory Visit: Payer: Self-pay | Admitting: Nurse Practitioner

## 2024-09-02 ENCOUNTER — Other Ambulatory Visit: Payer: Self-pay | Admitting: Internal Medicine

## 2024-09-04 ENCOUNTER — Other Ambulatory Visit: Payer: Self-pay

## 2024-09-05 ENCOUNTER — Ambulatory Visit
Admission: RE | Admit: 2024-09-05 | Discharge: 2024-09-05 | Disposition: A | Source: Ambulatory Visit | Attending: Internal Medicine | Admitting: Internal Medicine

## 2024-09-05 DIAGNOSIS — J9859 Other diseases of mediastinum, not elsewhere classified: Secondary | ICD-10-CM

## 2024-09-06 ENCOUNTER — Emergency Department (HOSPITAL_COMMUNITY)

## 2024-09-06 ENCOUNTER — Telehealth: Payer: Self-pay

## 2024-09-06 ENCOUNTER — Encounter (HOSPITAL_COMMUNITY): Payer: Self-pay

## 2024-09-06 ENCOUNTER — Emergency Department (HOSPITAL_COMMUNITY)
Admission: EM | Admit: 2024-09-06 | Discharge: 2024-09-06 | Disposition: A | Discharge status: Home / Self Care | Attending: Emergency Medicine | Admitting: Emergency Medicine

## 2024-09-06 ENCOUNTER — Other Ambulatory Visit: Payer: Self-pay

## 2024-09-06 DIAGNOSIS — J90 Pleural effusion, not elsewhere classified: Secondary | ICD-10-CM | POA: Insufficient documentation

## 2024-09-06 DIAGNOSIS — Z7901 Long term (current) use of anticoagulants: Secondary | ICD-10-CM | POA: Diagnosis not present

## 2024-09-06 DIAGNOSIS — Z79899 Other long term (current) drug therapy: Secondary | ICD-10-CM | POA: Insufficient documentation

## 2024-09-06 DIAGNOSIS — N189 Chronic kidney disease, unspecified: Secondary | ICD-10-CM | POA: Diagnosis not present

## 2024-09-06 DIAGNOSIS — D649 Anemia, unspecified: Secondary | ICD-10-CM

## 2024-09-06 DIAGNOSIS — R918 Other nonspecific abnormal finding of lung field: Secondary | ICD-10-CM

## 2024-09-06 DIAGNOSIS — N184 Chronic kidney disease, stage 4 (severe): Secondary | ICD-10-CM

## 2024-09-06 DIAGNOSIS — I129 Hypertensive chronic kidney disease with stage 1 through stage 4 chronic kidney disease, or unspecified chronic kidney disease: Secondary | ICD-10-CM | POA: Insufficient documentation

## 2024-09-06 DIAGNOSIS — Z9889 Other specified postprocedural states: Secondary | ICD-10-CM

## 2024-09-06 DIAGNOSIS — R0602 Shortness of breath: Secondary | ICD-10-CM | POA: Diagnosis present

## 2024-09-06 HISTORY — PX: IR THORACENTESIS ASP PLEURAL SPACE W/IMG GUIDE: IMG5380

## 2024-09-06 LAB — CBC WITH DIFFERENTIAL/PLATELET
Abs Immature Granulocytes: 0.02 K/uL (ref 0.00–0.07)
Basophils Absolute: 0 K/uL (ref 0.0–0.1)
Basophils Relative: 1 %
Eosinophils Absolute: 0.1 K/uL (ref 0.0–0.5)
Eosinophils Relative: 1 %
HCT: 26.8 % — ABNORMAL LOW (ref 36.0–46.0)
Hemoglobin: 8.5 g/dL — ABNORMAL LOW (ref 12.0–15.0)
Immature Granulocytes: 0 %
Lymphocytes Relative: 7 %
Lymphs Abs: 0.4 K/uL — ABNORMAL LOW (ref 0.7–4.0)
MCH: 31.5 pg (ref 26.0–34.0)
MCHC: 31.7 g/dL (ref 30.0–36.0)
MCV: 99.3 fL (ref 80.0–100.0)
Monocytes Absolute: 0.5 K/uL (ref 0.1–1.0)
Monocytes Relative: 8 %
Neutro Abs: 5.2 K/uL (ref 1.7–7.7)
Neutrophils Relative %: 83 %
Platelets: 321 K/uL (ref 150–400)
RBC: 2.7 MIL/uL — ABNORMAL LOW (ref 3.87–5.11)
RDW: 13.6 % (ref 11.5–15.5)
WBC: 6.2 K/uL (ref 4.0–10.5)
nRBC: 0 % (ref 0.0–0.2)

## 2024-09-06 LAB — BASIC METABOLIC PANEL WITH GFR
Anion gap: 18 — ABNORMAL HIGH (ref 5–15)
BUN: 24 mg/dL — ABNORMAL HIGH (ref 8–23)
CO2: 19 mmol/L — ABNORMAL LOW (ref 22–32)
Calcium: 9 mg/dL (ref 8.9–10.3)
Chloride: 104 mmol/L (ref 98–111)
Creatinine, Ser: 1.77 mg/dL — ABNORMAL HIGH (ref 0.44–1.00)
GFR, Estimated: 28 mL/min — ABNORMAL LOW (ref >60)
Glucose, Bld: 133 mg/dL — ABNORMAL HIGH (ref 70–99)
Potassium: 4.4 mmol/L (ref 3.5–5.1)
Sodium: 141 mmol/L (ref 135–145)

## 2024-09-06 LAB — BODY FLUID CELL COUNT WITH DIFFERENTIAL
Eos, Fluid: 0 %
Lymphs, Fluid: 85 %
Monocyte-Macrophage-Serous Fluid: 13 % — ABNORMAL LOW (ref 50–90)
Neutrophil Count, Fluid: 2 % (ref 0–25)
Total Nucleated Cell Count, Fluid: 850 uL (ref 0–1000)

## 2024-09-06 LAB — TROPONIN I (HIGH SENSITIVITY)
Troponin I (High Sensitivity): 23 ng/L — ABNORMAL HIGH (ref <18)
Troponin I (High Sensitivity): 20 ng/L — ABNORMAL HIGH (ref <18)

## 2024-09-06 LAB — GRAM STAIN

## 2024-09-06 LAB — PROTEIN, PLEURAL OR PERITONEAL FLUID: Total protein, fluid: <3 g/dL

## 2024-09-06 LAB — BRAIN NATRIURETIC PEPTIDE: B Natriuretic Peptide: 645.6 pg/mL — ABNORMAL HIGH (ref 0.0–100.0)

## 2024-09-06 LAB — PROTEIN, TOTAL: Total Protein: 5.8 g/dL — ABNORMAL LOW (ref 6.5–8.1)

## 2024-09-06 LAB — LACTATE DEHYDROGENASE, PLEURAL OR PERITONEAL FLUID: LD, Fluid: 75 U/L — ABNORMAL HIGH (ref 3–23)

## 2024-09-06 LAB — RESP PANEL BY RT-PCR (RSV, FLU A&B, COVID)  RVPGX2
Influenza A by PCR: NEGATIVE
Influenza B by PCR: NEGATIVE
Resp Syncytial Virus by PCR: NEGATIVE
SARS Coronavirus 2 by RT PCR: NEGATIVE

## 2024-09-06 LAB — D-DIMER, QUANTITATIVE: D-Dimer, Quant: 0.68 ug{FEU}/mL — ABNORMAL HIGH (ref 0.00–0.50)

## 2024-09-06 LAB — GLUCOSE, PLEURAL OR PERITONEAL FLUID: Glucose, Fluid: 101 mg/dL

## 2024-09-06 LAB — LACTATE DEHYDROGENASE: LDH: 185 U/L (ref 98–192)

## 2024-09-06 MED ORDER — ASPIRIN 81 MG PO CHEW
324.0000 mg | CHEWABLE_TABLET | Freq: Once | ORAL | Status: AC
  Administered 2024-09-06: 324 mg via ORAL
  Filled 2024-09-06: qty 4

## 2024-09-06 MED ORDER — NITROGLYCERIN 0.4 MG SL SUBL
0.4000 mg | SUBLINGUAL_TABLET | SUBLINGUAL | Status: DC | PRN
Start: 2024-09-06 — End: 2024-09-06

## 2024-09-06 MED ORDER — LIDOCAINE HCL 1 % IJ SOLN
INTRAMUSCULAR | Status: AC
Start: 2024-09-06 — End: 2024-09-06
  Filled 2024-09-06: qty 20

## 2024-09-06 NOTE — ED Notes (Signed)
 Pt transported to IR

## 2024-09-06 NOTE — Telephone Encounter (Signed)
 Copied from CRM [id number]. Topic: Clinical - Lab/Test Results >> Sep 05, 2024  5:57 PM Ashley R wrote: Reason for CRM: Missed call re: test results Callback [phone] before 1130PM preferred

## 2024-09-06 NOTE — Consult Note (Signed)
 NAME:  Carolyn Wells, MRN:  [id number], DOB:  December 30, 1940, LOS: 0 ADMISSION DATE:  09/06/2024, CONSULTATION DATE:  09/06/24 REFERRING MD:  Dr. Randol, CHIEF COMPLAINT:  SOB   History of Present Illness:   77 yoF Afib on Xarelto , HTN, HLD, GERD, CKD (only one kidney, previously donated to her husband years ago), and chronic low back pain who presented to ER for increasing exertional dyspnea and chest tightness.  Pt reports has had intermittent dyspnea for over one year, progressively worse and sometimes worse when laying flat.  Former smoker for 10 yrs, quit in her 36's.  Denies any worsening lower leg edema, actually better recently on lasix , without LE erythema or pain, productive cough, fever, wt loss, personal or family hx of cancer.   Recently seen by PCP with CXR performed 10/17 which showed masslike prominence of right aspect of upper mediastinum suspicious for mass or lymphadenopathy, moderate right and small left pleural effusions, mild cardiomegaly.  Given abnormal CXR, CT chest ordered, completed 10/28.  Additionally underwent DCCV to SR by cardiology on 10/22.    In ER, afebrile, normotensive, 98% on room air, non labored, and EKG c/w afib with rate in 70's. CXR showed similar findings, right pleural effusion slightly increased.  CT chest w/o from 10/28 showed 4.5 x 3.5 x 3 cm medial RUL mass c/w primary lung neoplasm with PET recommended.  Also noted for mediastinal, right hilar, right supraclavicular and right axillary adenopathy c/w metastatic disease with large right pleural effusion, RML atelectasis, small left pleural effusion, and no findings for upper abd metastatic disease process.  IR was consulted and underwent right thoracentesis with 1L hazy yellow fluid drained and sent for pleural studies and cytology.  Pt with symptomatic improvement with dyspnea since thoracentesis.  Pulmonary consulted for further recommendations.   Pertinent  Medical History   Past Medical History:   Diagnosis Date   ABDOMINAL INCISIONAL HERNIA 08/13/2009   s/p repair   Allergic rhinitis, cause unspecified 09/28/2011   BRADYCARDIA 09/26/2010   CKD (chronic kidney disease), stage III (HCC) 02/26/2015   Solitary kidney   COLONIC POLYPS, HX OF 08/25/2007   Complication of anesthesia    DIVERTICULOSIS, COLON 08/25/2007   Dysrhythmia    GASTROESOPHAGEAL REFLUX DISEASE 08/04/2007   HYPERLIPIDEMIA 09/27/2010   not on Rx therapy   HYPERTENSION 08/06/2007   Hypothyroidism    INTERNAL HEMORRHOIDS 08/04/2007   LOW BACK PAIN 08/06/2007   scoliosis; DJD; gets injections   OSTEOPENIA 08/04/2007   PAF (paroxysmal atrial fibrillation) (HCC)    a.  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    Significant Hospital Events: Including procedures, antibiotic start and stop dates in addition to other pertinent events   10/29 IR right thoracentesis  Interim History / Subjective:    Objective    Blood pressure 137/74, pulse 76, temperature (!) 97.5 F (36.4 C), temperature source Oral, resp. rate (!) 23, height 5' 6 (1.676 m), weight 64 kg, SpO2 100%.       No intake or output data in the 24 hours ending 09/06/24 1528 Filed Weights   09/06/24 1023  Weight: 64 kg   Examination: General:  Pleasant older female sitting on ER stretcher in NAD HEENT: MM pink/moist, scant supraclavicular adenopathy noted more medial, non tender  Neuro: Alert,  oriented, appropriate, MAE CV: rr ir, no murmur PULM:  non labored, clear, RA GI: soft, bs+ Extremities: warm/dry, some LE edema (improved from baseline) Skin: no rashes   Afebrile Labs reviewed> H/H 8.5/ 26, WBC 6.2, BNP 645, trop hs 23> 20, BUN/ sCr 24/ 1.77 (baseline ~1.8) EKG- afib, rate 70's  Resolved problem list   Assessment and Plan   RUL lung mass with  adenopathy concerning for malignancy  Bilateral pleural effusions, right > left  Dyspnea- likely multifactorial but symptomatic improvement s/p thoracentesis  - 4.5x 3.5x 3 cm with mediastinal, right hilar, right supraclavicular and right axillary adenopathy  - s/p IR right thoracentesis 10/29 P:  - ok to discharge home from pulmonary standpoint - follow right pleural fluid studies and cytology.  Will need to trend CXR to look for re-accumulation  - hold xarelto  for now in event further procedures are needed if pleural cytology is negative.  If negative, could consider IR biopsy of right supraclavicular vs axillary node in hopes to avoid sedation/ EBUS.   - pulmonary follow up scheduled for Nov 4 at 0930 with Lauraine Lites, NP   Labs   CBC: Recent Labs  Lab 09/06/24 1031  WBC 6.2  NEUTROABS 5.2  HGB 8.5*  HCT 26.8*  MCV 99.3  PLT 321    Basic Metabolic Panel: Recent Labs  Lab 09/06/24 1031  NA 141  K 4.4  CL 104  CO2 19*  GLUCOSE 133*  BUN 24*  CREATININE 1.77*  CALCIUM  9.0   GFR: Estimated Creatinine Clearance: 22.1 mL/min (A) (by C-G formula based on SCr of 1.77 mg/dL (H)). Recent Labs  Lab 09/06/24 1031  WBC 6.2    Liver Function Tests: No results for input(s): AST, ALT, ALKPHOS, BILITOT, PROT, ALBUMIN in the last 168 hours. No results for input(s): LIPASE, AMYLASE in the last 168 hours. No results for input(s): AMMONIA in the last 168 hours.  ABG No results found for: PHART, PCO2ART, PO2ART, HCO3, TCO2, ACIDBASEDEF, O2SAT   Coagulation Profile: No results for input(s): INR, PROTIME in the last 168 hours.  Cardiac Enzymes: No results for input(s): CKTOTAL, CKMB, CKMBINDEX, TROPONINI in the last 168 hours.  HbA1C: Hgb A1c MFr Bld  Date/Time Value Ref Range Status  11/18/2023 01:40 PM 6.0 4.6 - 6.5 % Final    Comment:    Glycemic Control Guidelines for People with Diabetes:Non Diabetic:  <6%Goal of Therapy:  <7%Additional Action Suggested:  >8%   04/21/2023 02:28 PM 5.4 4.6 - 6.5 % Final    Comment:    Glycemic Control Guidelines for People with Diabetes:Non Diabetic:  <6%Goal of Therapy: <7%Additional Action Suggested:  >8%     CBG: No results for input(s): GLUCAP in the last 168 hours.  Review of Systems:   Review of Systems  Constitutional:  Positive for malaise/fatigue. Negative for chills, fever and weight loss.  Respiratory:  Positive for cough and shortness of breath. Negative for hemoptysis and sputum production.   Cardiovascular:  Positive for chest pain, orthopnea and leg swelling.   Past Medical History:  She,  has a past medical history of ABDOMINAL INCISIONAL HERNIA (08/13/2009), Allergic rhinitis, cause unspecified (09/28/2011), BRADYCARDIA (09/26/2010), CKD (chronic kidney disease), stage III (HCC) (02/26/2015), COLONIC POLYPS, HX OF (08/25/2007), Complication of anesthesia, DIVERTICULOSIS, COLON (08/25/2007), Dysrhythmia, GASTROESOPHAGEAL REFLUX DISEASE (08/04/2007), HYPERLIPIDEMIA (09/27/2010), HYPERTENSION (08/06/2007), Hypothyroidism, INTERNAL HEMORRHOIDS (08/04/2007), LOW BACK PAIN (08/06/2007), OSTEOPENIA (08/04/2007), PAF (paroxysmal atrial fibrillation) (HCC), PONV (postoperative nausea and vomiting), and Solitary kidney, acquired (09/28/2011).  Surgical History:   Past Surgical History:  Procedure Laterality Date   CARDIOVERSION [name] 08/30/2024   Procedure: CARDIOVERSION;  Surgeon: Francyne [facility], MD;  Location: [facility];  Service: Cardiovascular;  Laterality: [name];   LAMINECTOMY  1999   L4-5/lumbar   NEPHRECTOMY     Right   s/o incisional hernia repair     RUQ   STRABISMUS SURGERY Bilateral 04/14/2019   Procedure: STRABISMUS REPAIR BILATERAL;  Surgeon: Neysa Fallow, MD;  Location: [facility];  Service: Ophthalmology;  Laterality: Bilateral;   TONSILLECTOMY       Social History:   reports that she has never smoked. She has never used  smokeless tobacco. She reports that she does not drink alcohol and does not use drugs.   Family History:  Her family history includes Alzheimer's disease in her brother; Coronary artery disease (age of onset: 59) in her mother; Diabetes in her mother and another family member; Heart attack (age of onset: 32) in her father.   Allergies Allergies  Allergen Reactions   Budesonide Other (See Comments)    Causes nasal bleeding   Elavil  [Amitriptyline ] Other (See Comments)    Feels bad   Gabapentin  Other (See Comments)    Loopy effect   Tramadol  Other (See Comments)    Loopy feeling   Hydrocodone Nausea Only and Other (See Comments)    lethargy   Ipratropium     Causes nasal bleeding     Home Medications  Prior to Admission medications   Medication Sig Start Date End Date Taking? Authorizing Provider  acetaminophen  (TYLENOL ) 500 MG tablet Take 500 mg by mouth every 4 (four) hours as needed for moderate pain (pain score 4-6) or mild pain (pain score 1-3).    [provider]  amLODipine  (NORVASC ) 2.5 MG tablet TAKE 1 TABLET BY MOUTH IN THE MORNING AND AT BEDTIME 09/04/24   Segal, Jared E, MD  Ascorbic Acid (VITAMIN C) 1000 MG tablet Take 1,000 mg by mouth daily.    [provider]  furosemide  (LASIX ) 20 MG tablet Take 1 tablet (20 mg total) by mouth every other day. 05/23/24   Swinyer, Rosaline HERO, NP  levothyroxine  (SYNTHROID ) 50 MCG tablet TAKE 1 TABLET BY MOUTH ONCE DAILY BEFORE BREAKFAST APPOINTMENT  REQUIRED  FOR  FUTURE  REFILLS 09/04/24   [name], MD  LUMIGAN 0.01 % SOLN Place 1 drop into both eyes at bedtime.  08/08/15   [provider]  metoprolol  tartrate (LOPRESSOR ) 25 MG tablet Take 3 tablets (75 mg total) by mouth daily. 02/24/24   [name], MD  Multiple Vitamins-Minerals (PRESERVISION AREDS 2 PO) Take 1 tablet by mouth daily.    [provider]  Multiple Vitamins-Minerals (WOMENS 50+ MULTI VITAMIN/MIN PO) Take 1 tablet by mouth daily.     [provider]  Omega-3 Fatty Acids (FISH OIL TRIPLE STRENGTH) 1400 MG CAPS Take 1,400 mg by mouth daily.    [provider]  pantoprazole  (PROTONIX ) 40 MG tablet Take 1 tablet (40 mg total) by mouth daily. TAKE 1 TABLET BY MOUTH ONCE DAILY 05/08/24   [name], MD  Perfluorohexyloctane (MIEBO) 1.338 GM/ML SOLN Place 1.338 mLs into both eyes 4 (four) times daily.    [provider]  potassium chloride  (KLOR-CON  M) 10 MEQ tablet Take 1 tablet by mouth once daily 05/23/24   Nahser, Aleene PARAS, MD  Rivaroxaban  (XARELTO ) 15 MG TABS tablet TAKE 1 TABLET BY MOUTH ONCE DAILY WITH SUPPER  05/08/24   Nahser, Aleene PARAS, MD  vitamin k 100 MCG tablet Take 100 mcg by mouth daily.    [provider]     Critical care time: n/a      Lyle Pesa, NP [facility] 09/06/2024, 3:28 PM  See Amion for pager If no response to pager , please call [phone] until 7pm After 7:00 pm call Elink  [phone]

## 2024-09-06 NOTE — Procedures (Signed)
 PROCEDURE SUMMARY:  Successful image-guided right thoracentesis. Yielded 1 liters of slightly hazy yellow fluid. Patient tolerated procedure well. EBL: trace No immediate complications.  Specimen was sent for labs. Post procedure CXR shows no pneumothorax.  Please see imaging section of Epic for full dictation.  Kimble DEL Jani Ploeger PA-C 09/06/2024 3:37 PM

## 2024-09-06 NOTE — ED Provider Triage Note (Signed)
 Emergency Medicine Provider Triage Evaluation Note  Carolyn Wells , a 83 y.o. female  was evaluated in triage.  Pt complains of chest pain and shortness of breath as well as lightheadedness. Has a hx of afib   Review of Systems  Positive: Chest pain, sob Negative: Nausea, vomiting, dark stools  Physical Exam  BP 126/76 (BP Location: Left Arm)   Pulse (!) 101   Temp (!) 97.5 F (36.4 C) (Oral)   Resp (!) 23   Ht 5' 6 (1.676 m)   Wt 64 kg   SpO2 98%   BMI 22.77 kg/m  Gen:   Awake, no distress   Resp:  Normal effort  MSK:   Moves extremities without difficulty    Medical Decision Making  Medically screening exam initiated at 10:26 AM.  Appropriate orders placed.  Lennart [name] was informed that the remainder of the evaluation will be completed by another provider, this initial triage assessment does not replace that evaluation, and the importance of remaining in the ED until their evaluation is complete.     Gennaro Bouchard L, DO 09/06/24 1027

## 2024-09-06 NOTE — Discharge Instructions (Addendum)
 Stop taking your blood thinner medication and Xarelto .  Follow-up with the pulmonary critical care doctors for further evaluation of the lung mass noted on your CT scan

## 2024-09-06 NOTE — ED Provider Notes (Signed)
 [place] EMERGENCY DEPARTMENT AT [facility] Provider Note   CSN: [id number] Arrival date & time: 09/06/24  1018     Patient presents with: Chest Pain and Shortness of Breath   Carolyn Wells is a 83 y.o. female.    Chest Pain Associated symptoms: shortness of breath   Shortness of Breath Associated symptoms: chest pain      Patient has a history of hyperlipidemia hypertension bradycardia acid reflux chronic kidney disease atrial fibrillation.  Patient states she has been having symptoms ongoing for a couple of weeks now.  She has been feeling short of breath.  She has also been experiencing a tightness in her chest.  Patient states she has also had increasing dyspnea on exertion.  She denies any leg swelling.  She has been coughing but no fevers.  Patient states last night her symptoms were more severe she could not catch her breath.  She continues to feel like there is a tight band across her chest.  Patient states she has been seen by her cardiologist and had a cardioversion procedure on the 22nd.  Patient states she also had an outpatient CT scan recently.  Prior to Admission medications   Medication Sig Start Date End Date Taking? Authorizing Provider  acetaminophen  (TYLENOL ) 500 MG tablet Take 500 mg by mouth every 4 (four) hours as needed for moderate pain (pain score 4-6) or mild pain (pain score 1-3).    [provider]  amLODipine  (NORVASC ) 2.5 MG tablet TAKE 1 TABLET BY MOUTH IN THE MORNING AND AT BEDTIME 09/04/24   Segal, Jared E, MD  Ascorbic Acid (VITAMIN C) 1000 MG tablet Take 1,000 mg by mouth daily.    [provider]  furosemide  (LASIX ) 20 MG tablet Take 1 tablet (20 mg total) by mouth every other day. 05/23/24   Swinyer, Rosaline CHRISTELLA, NP  levothyroxine  (SYNTHROID ) 50 MCG tablet TAKE 1 TABLET BY MOUTH ONCE DAILY BEFORE BREAKFAST APPOINTMENT  REQUIRED  FOR  FUTURE  REFILLS 09/04/24   [name], MD  LUMIGAN 0.01 % SOLN Place 1 drop into  both eyes at bedtime.  08/08/15   [provider]  metoprolol  tartrate (LOPRESSOR ) 25 MG tablet Take 3 tablets (75 mg total) by mouth daily. 02/24/24   [name], MD  Multiple Vitamins-Minerals (PRESERVISION AREDS 2 PO) Take 1 tablet by mouth daily.    [provider]  Multiple Vitamins-Minerals (WOMENS 50+ MULTI VITAMIN/MIN PO) Take 1 tablet by mouth daily.    [provider]  Omega-3 Fatty Acids (FISH OIL TRIPLE STRENGTH) 1400 MG CAPS Take 1,400 mg by mouth daily.    [provider]  pantoprazole  (PROTONIX ) 40 MG tablet Take 1 tablet (40 mg total) by mouth daily. TAKE 1 TABLET BY MOUTH ONCE DAILY 05/08/24   [name], MD  Perfluorohexyloctane (MIEBO) 1.338 GM/ML SOLN Place 1.338 mLs into both eyes 4 (four) times daily.    [provider]  potassium chloride  (KLOR-CON  M) 10 MEQ tablet Take 1 tablet by mouth once daily 05/23/24   Nahser, Aleene PARAS, MD  Rivaroxaban  (XARELTO ) 15 MG TABS tablet TAKE 1 TABLET BY MOUTH ONCE DAILY WITH SUPPER 05/08/24   Nahser, Aleene PARAS, MD  vitamin k 100 MCG tablet Take 100 mcg by mouth daily.    [provider]    Allergies: Budesonide, Elavil  [amitriptyline ], Gabapentin , Tramadol , Hydrocodone, and Ipratropium    Review of Systems  Respiratory:  Positive for shortness of breath.   Cardiovascular:  Positive for chest pain.    Updated Vital Signs BP 137/74   Pulse 76   Temp (!) 97.5 F (36.4 C) (Oral)   Resp (!) 23   Ht 1.676 m (5' 6)   Wt 64 kg   SpO2 100%   BMI 22.77 kg/m   Physical Exam Vitals and nursing note reviewed.  Constitutional:      General: She is not in acute distress.    Appearance: She is well-developed.  HENT:     Head: Normocephalic and atraumatic.     Right Ear: External ear normal.     Left Ear: External ear normal.  Eyes:     General: No scleral icterus.       Right eye: No discharge.        Left eye: No discharge.     Conjunctiva/sclera: Conjunctivae normal.  Neck:      Trachea: No tracheal deviation.  Cardiovascular:     Rate and Rhythm: Normal rate and regular rhythm.  Pulmonary:     Effort: Pulmonary effort is normal. No respiratory distress.     Breath sounds: Normal breath sounds. No stridor. No wheezing or rales.  Abdominal:     General: Bowel sounds are normal. There is no distension.     Palpations: Abdomen is soft.     Tenderness: There is no abdominal tenderness. There is no guarding or rebound.  Musculoskeletal:        General: No tenderness or deformity.     Cervical back: Neck supple.  Skin:    General: Skin is warm and dry.     Findings: No rash.  Neurological:     General: No focal deficit present.     Mental Status: She is alert.     Cranial Nerves: No cranial nerve deficit, dysarthria or facial asymmetry.     Sensory: No sensory deficit.     Motor: No abnormal muscle tone or seizure activity.     Coordination: Coordination normal.  Psychiatric:        Mood and Affect: Mood normal.     (all labs ordered are listed, but only abnormal results are displayed) Labs Reviewed  BASIC METABOLIC PANEL WITH GFR - Abnormal; Notable for the following components:      Result Value   CO2 19 (*)    Glucose, Bld 133 (*)    BUN 24 (*)    Creatinine, Ser 1.77 (*)    GFR, Estimated 28 (*)    Anion gap 18 (*)    All other components within normal limits  BRAIN NATRIURETIC PEPTIDE - Abnormal; Notable for the following components:   B Natriuretic Peptide 645.6 (*)    All other components within normal limits  CBC WITH DIFFERENTIAL/PLATELET - Abnormal; Notable for the following components:   RBC 2.70 (*)    Hemoglobin 8.5 (*)    HCT 26.8 (*)    Lymphs Abs 0.4 (*)    All other components within normal limits  D-DIMER, QUANTITATIVE - Abnormal; Notable for the following components:   D-Dimer, Quant 0.68 (*)    All other components within normal limits  LACTATE DEHYDROGENASE, PLEURAL OR PERITONEAL FLUID - Abnormal; Notable for the  following components:   LD, Fluid 75 (*)    All other components within normal limits  TROPONIN I (HIGH SENSITIVITY) - Abnormal; Notable for the following components:   Troponin I (High Sensitivity) 23 (*)    All other components within normal limits  TROPONIN I (HIGH SENSITIVITY) -  Abnormal; Notable for the following components:   Troponin I (High Sensitivity) 20 (*)    All other components within normal limits  RESP PANEL BY RT-PCR (RSV, FLU A&B, COVID)  [id number]  CULTURE, BODY FLUID W GRAM STAIN -BOTTLE  GRAM STAIN  PROTEIN, PLEURAL OR PERITONEAL FLUID  GLUCOSE, PLEURAL OR PERITONEAL FLUID  BODY FLUID CELL COUNT WITH DIFFERENTIAL  CYTOLOGY - NON PAP    EKG: EKG Interpretation Date/Time:  Wednesday September 06 2024 10:23:41 EDT Ventricular Rate:  75 PR Interval:    QRS Duration:  74 QT Interval:  356 QTC Calculation: 397 R Axis:   91  Text Interpretation: Atrial fibrillation Rightward axis Low voltage QRS Nonspecific ST abnormality Compared with prior EKG from 08/30/2024 Confirmed by Gennaro Bouchard ([id number]) on 09/06/2024 10:26:31 AM  Radiology: IR THORACENTESIS ASP PLEURAL SPACE W/IMG GUIDE Result Date: 09/06/2024 INDICATION: Patient with right upper lobe lung mass, right pleural effusion. Consult for diagnostic and therapeutic right thoracentesis. EXAM: ULTRASOUND GUIDED DIAGNOSTIC AND THERAPEUTIC RIGHT THORACENTESIS MEDICATIONS: 8 mL 1% lidocaine  COMPLICATIONS: None immediate. PROCEDURE: An ultrasound guided thoracentesis was thoroughly discussed with the patient and questions answered. The benefits, risks, alternatives and complications were also discussed. The patient understands and wishes to proceed with the procedure. Written consent was obtained. Ultrasound was performed to localize and mark an adequate pocket of fluid in the right chest. The area was then prepped and draped in the normal sterile fashion. 1% Lidocaine  was used for local anesthesia. Under ultrasound guidance a  6 Fr Safe-T-Centesis catheter was introduced. Thoracentesis was performed. The catheter was removed and a dressing applied. FINDINGS: A total of approximately 1 liter of slightly hazy yellow fluid was removed. IMPRESSION: Successful ultrasound guided right thoracentesis yielding 1 liter of pleural fluid. Performed and dictated by Kimble Clas, PA-C Electronically Signed   By: CHRISTELLA.  Shick M.D.   On: 09/06/2024 15:33   DG Chest 1 View Result Date: 09/06/2024 EXAM: 1 VIEW XRAY OF THE CHEST 09/06/2024 02:50:00 PM COMPARISON: Comparison same date. CLINICAL HISTORY: [id number] Status post thoracentesis [id number]. Reason for exam: S/P right thoracentesis, 1 liter removed from the right side. FINDINGS: LUNGS AND PLEURA: No focal pulmonary opacity. No pulmonary edema. Right pleural effusion is significantly smaller. Status post right-sided thoracentesis. No definite pneumothorax. HEART AND MEDIASTINUM: No acute abnormality of the cardiac and mediastinal silhouettes. BONES AND SOFT TISSUES: No acute osseous abnormality. IMPRESSION: 1. No pneumothorax detected. 2. Marked interval decrease in right pleural effusion size. Electronically signed by: Lynwood Seip MD 09/06/2024 03:29 PM EDT RP Workstation: [id number]   CT Chest Wo Contrast Result Date: 09/06/2024 CLINICAL DATA:  Evaluate abnormal chest x-ray. EXAM: CT CHEST WITHOUT CONTRAST TECHNIQUE: Multidetector CT imaging of the chest was performed following the standard protocol without IV contrast. RADIATION DOSE REDUCTION: This exam was performed according to the departmental dose-optimization program which includes automated exposure control, adjustment of the mA and/or kV according to patient size and/or use of iterative reconstruction technique. COMPARISON:  Chest x-ray 08/25/2024 FINDINGS: Cardiovascular: The heart is normal in size. No pericardial effusion. The aorta is within normal limits in caliber. Scattered atherosclerotic calcifications. Scattered three-vessel  coronary artery calcifications. Mediastinum/Nodes: Mediastinal and right hilar adenopathy. Right paratracheal/precarinal adenopathy with maximum measurement of 18 mm. Right hilar node measures 15 mm. There is also an enlarged right supraclavicular node measuring 7 mm on image 19/2. Right axillary adenopathy measuring a maximum of 15 mm. The esophagus is grossly normal. Lungs/Pleura: There is a large medial right upper lobe lung  mass. This measures approximately 4.5 x 3.5 x 3.0 cm. I do not see any definite metastatic pulmonary nodules. There is a large right pleural effusion which certainly could be malignant and thoracentesis may be held a diagnosis. Significant right middle lobe atelectasis without obvious endobronchial lesion. Lingular atelectasis is also noted. There is a small left pleural effusion. Upper Abdomen: No significant upper abdominal findings. No hepatic or adrenal gland lesions to suggest metastatic disease. No upper abdominal adenopathy. Aortic calcifications are noted. Musculoskeletal: No breast masses are identified. No bone lesions to suggest metastatic disease. IMPRESSION: 1. 4.5 x 3.5 x 3.0 cm medial right upper lobe lung mass consistent with primary lung neoplasm. Recommend PET-CT for further evaluation and staging and referral to multi disciplinary thoracic Oncology clinic. 2. Mediastinal, right hilar, right supraclavicular and right axillary adenopathy consistent with metastatic disease. 3. Large right pleural effusion which certainly could be malignant and thoracentesis may be yield a diagnosis. 4. Significant right middle lobe atelectasis without obvious endobronchial lesion. 5. Small left pleural effusion. 6. No findings for upper abdominal metastatic disease. 7. Aortic atherosclerosis. Aortic Atherosclerosis (ICD10-I70.0). Electronically Signed   By: MYRTIS Stammer M.D.   On: 09/06/2024 12:51   DG Chest 2 View Result Date: 09/06/2024 EXAM: 2 VIEW(S) XRAY OF THE CHEST 09/06/2024  10:51:00 AM COMPARISON: 08/25/2024 CLINICAL HISTORY: sob. Per triage notes: Pt c/o chest tightness and shortness of breath for awhile. Pt has labored breathing, able to speak a few words at a time, states the pain feels like a tightness around her chest. Pt states tightness and shortness of breath have been ; getting worse.  FINDINGS: LUNGS AND PLEURA: Persistent right suprahilar mass. Patchy airspace opacities in left lung base. Increased small right pleural effusion. No pulmonary edema. No pneumothorax. HEART AND MEDIASTINUM: No acute abnormality of the cardiac and mediastinal silhouettes. BONES AND SOFT TISSUES: No acute osseous abnormality. IMPRESSION: 1. Persistent right suprahilar mass. 2. Increased small right pleural effusion. 3. Patchy airspace opacities in left lung base. Electronically signed by: Evalene Coho MD 09/06/2024 12:11 PM EDT RP Workstation: [id number]     Procedures   Medications Ordered in the ED  nitroGLYCERIN (NITROSTAT) SL tablet 0.4 mg (has no administration in time range)  aspirin chewable tablet 324 mg (324 mg Oral Given 09/06/24 1113)    Clinical Course as of 09/06/24 1537  Wed Sep 06, 2024  1123 CBC with Differential(!) Hemoglobin decreased from most recent [JK]  1124 Basic metabolic panel(!) Metabolic panel shows stable BUN and creatinine [JK]  1124 Troponin I (High Sensitivity)(!) Troponin mildly elevated BNP also elevated [JK]  1223 D-dimer slightly elevated 0.68, within age-adjusted normal range [JK]  1223 Chest x-ray shows a persistent right suprahilar mass.  Increased small right pleural effusion, patchy airspace opacities in the left lung [JK]  1348 Patient CT scan shows a medial right upper lobe lung mass consistent with primary lung neoplasm.  Patient also has lymphadenopathy in the mediastinum hilar axillary and supraclavicular region.  Patient also has a large right pleural effusion [JK]  1357 Ultrasound thoracentesis order placed.  PT in no  distress at bedside [JK]  1422 Case reviewed with pulmonary critical care to get their recommendations regarding any further need for expedited workup in the ED [JK]  1517 Pt evaluated by Dr Gretta in the ED.  Will have pt stop eliquis.  SHe tolerated the thoracentesis procedure well.  Plan on close outpt follow up PCCM [JK]    Clinical Course User Index [JK] Randol,  Thom, MD                                 Medical Decision Making Amount and/or Complexity of Data Reviewed Labs: ordered. Decision-making details documented in ED Course.  Risk OTC drugs. Prescription drug management.   Patient presented to the ED for evaluation of chest pain and shortness of breath.  In the ED patient initially noted to be mildly tachycardic but heart rate normalized without intervention.  Patient's ED workup showed no signs of acute cardiac injury.  Troponins were only mildly elevated at 23 and 20.  I doubt ACS.  Patient's CBC showed an anemia not significantly changed compared to previous.  No significant metabolic abnormalities noted.  She did not have evidence of COVID or flu.  Findings not suggestive of CHF.  Patient had an outpatient CT scan performed yesterday.  I asked radiology to read that urgently.  CT scan does show findings worrisome for malignancy.  She has got right upper lobe mass as well as lymphadenopathy.  She also had a large pleural effusion on CT.  Interventional radiology team Con[place] did an ultrasound thoracentesis.  Patient tolerated that without difficulty.  I consulted with pulmonary critical care to see if there is any other workup necessary at this time.  Dr. Gretta kindly evaluated the patient in the ED.  She will arrange for close outpatient follow-up with pulmonology.  Will follow-up on the thoracentesis findings today       Final diagnoses:  Mass of lung  Pleural effusion    ED Discharge Orders     None          Randol Thom, MD 09/06/24 1537

## 2024-09-06 NOTE — ED Triage Notes (Signed)
 Pt c/o chest tightness and shortness of breath for awhile. Pt has labored breathing, able to speak a few words at a time, states the pain feels like a tightness around her chest. Pt states tightness and shortness of breath have been getting worse.

## 2024-09-07 ENCOUNTER — Encounter (HOSPITAL_COMMUNITY): Payer: Self-pay | Admitting: *Deleted

## 2024-09-07 LAB — PATHOLOGIST SMEAR REVIEW

## 2024-09-07 NOTE — Telephone Encounter (Signed)
 Spoke with Pt daughter states understanding no further questions at this time.

## 2024-09-11 LAB — CULTURE, BODY FLUID W GRAM STAIN -BOTTLE: Culture: NO GROWTH

## 2024-09-12 ENCOUNTER — Ambulatory Visit (INDEPENDENT_AMBULATORY_CARE_PROVIDER_SITE_OTHER): Admitting: Acute Care

## 2024-09-12 ENCOUNTER — Encounter: Payer: Self-pay | Admitting: Acute Care

## 2024-09-12 ENCOUNTER — Telehealth: Payer: Self-pay | Admitting: Acute Care

## 2024-09-12 ENCOUNTER — Ambulatory Visit (INDEPENDENT_AMBULATORY_CARE_PROVIDER_SITE_OTHER)

## 2024-09-12 ENCOUNTER — Telehealth: Payer: Self-pay

## 2024-09-12 ENCOUNTER — Telehealth: Payer: Self-pay | Admitting: Internal Medicine

## 2024-09-12 VITALS — BP 130/80 | HR 68 | Temp 97.4°F | Ht 66.0 in | Wt 143.6 lb

## 2024-09-12 DIAGNOSIS — J90 Pleural effusion, not elsewhere classified: Secondary | ICD-10-CM

## 2024-09-12 DIAGNOSIS — R9389 Abnormal findings on diagnostic imaging of other specified body structures: Secondary | ICD-10-CM

## 2024-09-12 DIAGNOSIS — R911 Solitary pulmonary nodule: Secondary | ICD-10-CM

## 2024-09-12 DIAGNOSIS — Z87891 Personal history of nicotine dependence: Secondary | ICD-10-CM | POA: Diagnosis not present

## 2024-09-12 DIAGNOSIS — C3490 Malignant neoplasm of unspecified part of unspecified bronchus or lung: Secondary | ICD-10-CM | POA: Diagnosis not present

## 2024-09-12 LAB — CYTOLOGY - NON PAP

## 2024-09-12 NOTE — Telephone Encounter (Signed)
 Copied from CRM [id number]. Topic: Clinical - Lab/Test Results >> Sep 12, 2024  3:43 PM Russell PARAS wrote: Reason for CRM:   Pt's daughter [name] is calling to see if chest x-ray results have returned, was wanting to know how much accumulation of fluid was present. Read through impression and offered to have nurse contact her back to answer any additional questions.   CB#  [phone] [id number]   Daughter spoke and understands everything and has the schedule for the pleural infusion and will call back for any questions

## 2024-09-12 NOTE — Telephone Encounter (Signed)
 Spoke with pt's daughter per [facility] regarding pt's care. Pt has been having fluid build up in her lungs and is having trouble breathing but is being followed by pulmonology. She has a recent diagnosis of lung cancer. Pt may need procedure so Xarelto  was stopped for the time being. Daughter is wondering if it makes sense to hold off on the pt's upcoming tests. Pt is also wondering about Dr. Ali suggestions regarding Xarelto . Pt's daughter is wondering if she should continue taking the medication until she knows when a procedure will be done. Will route to Dr. Kriste for his review. Pt's daughter verbalized understanding. All questions if any were answered.

## 2024-09-12 NOTE — Telephone Encounter (Signed)
 Patient c/o Palpitations:  STAT if patient reporting lightheadedness, shortness of breath, or chest pain  How long have you had palpitations/irregular HR/ Afib? Are you having the symptoms now? Since wed, yes  Are you currently experiencing lightheadedness, SOB or CP? Sob,cp  Do you have a history of afib (atrial fibrillation) or irregular heart rhythm? yes  Have you checked your BP or HR? (document readings if available): Pt was at ER on Wed  Are you experiencing any other symptoms? Fluid build up, Recent finding of lung cancer. Pulmonary told her to check with us  about maybe cancelling stress test for now. She wants Dr Kriste to know what is going on.

## 2024-09-12 NOTE — Telephone Encounter (Signed)
 Carolyn Wells is within 4 weeks of a cardioversion for atrial fibrillation which was on 10/22 and is at her highest risk of stroke within this 4 week window. Therefore, she should continue uninterrupted anticoagulation with Xarelto  for at least 4 weeks after the cardioversion (at least until 11/19). I would defer to her pulmonologist and interventional radiologist if they can postpone the procedure for that long or do the procedure while on Xarelto . If the urgency is too great and she needs to stop the Xarelto , this should be done for the shortest amount of time possible and she should restart it ASAP and understand that there is an increased risk of stroke during that time period.   -Jairy Angulo, DO

## 2024-09-12 NOTE — Patient Instructions (Addendum)
 Is good to see today. We have reviewed the results of your pleural fluid cytology. This was positive for malignant cells consistent with metastatic adenocarcinoma of the lung We will order both a PET scan, and a MRI of the brain to stage her cancer.Carolyn Wells You will get phone calls to get this scheduled. I will also make a referral to medical oncology. I will talk to Dr. Shelah today regarding best options for tissue sampling. The better option may be the axillary node that was noted on the CT scan. I will call you and let you know what the best option is and we will make arrangements to have this done. We will do a chest x-ray today to evaluate if you have reaccumulated fluid in the right side of your lung. I will call you with the results of the chest x-ray, and based on that we will determine if you need another thoracentesis to drain. If we are not going to repeat a procedure we would need you to start back on your Xarelto . We will call your daughter Carolyn Wells [phone] to schedule all procedures, and to let you know the CXR results. Please contact office for sooner follow up if symptoms do not improve or worsen or seek emergency care

## 2024-09-12 NOTE — Telephone Encounter (Signed)
 I called patient daughter to review the CXR we did in the office this morning. CXR was done to reassess the right pleural effusion for re-accumulation as patient is short of breath. CXR shows a moderate sized right pleural effusion with re accumulation since the previous exam. There is also a small left effusion. There was no answer when I called . I explained that there has been re-accumulation of the pleural effusion. I have placed an order for thoracentesis to be done by IR asap. Pt. Is currently off her Xarelto  . I left a call back number. I will try her again later today.

## 2024-09-12 NOTE — Progress Notes (Signed)
 History of Present Illness Carolyn Wells is a 83 y.o. female remote former smoker referred to pulmonary after an incidental finding of a RUL lung mass with adenopathy concerning for malignancy, as well as a large right pleural effusion. She will be followed by Dr. Shelah , and the IP team.    Synopsis 83 y/o woman with a history of very remote tobacco abuse, A-fib, chronic dyspnea who presented for evaluation of chest tightness and chronic shortness of breath. She has a history of Afib who underwent cardioversion about a week ago; she has remained on Xarelto . She Recently had a chest CT performed as an outpatient yesterday for evaluation of her dyspnea. In the ED she had her CT reviewed demonstrating a lung mass and a R dependent pleural effusion. She is a remote 12 year smoker, quitting in her late 1s. No personal or family history of cancers. Single kidney due to donation to her husband in the past. She had 1L pleural fluid drained by IR 09/06/2024 and had improvement in her SOB . No weight loss, normal appetite.   She underwent a thoracentesis 09/06/2024 of the right effusion which was both therapeutic and diagnostic for Malignant cells Consistent with metastatic adenocarcinoma of lung origin.She is here for folow up.     09/12/2024 Discussed the use of AI scribe software for clinical note transcription with the patient, who gave verbal consent to proceed.  History of Present Illness Pt. Presents for consult after an incidental finding of a mediastinal mass on imaging when she was admitted to the ED for Gall Bladder issues 12/08/2023. She was seen by the inpatient pulmonary team.   She is here today with her daughter for consultation and to determine if thoracentesis done 09/06/2024 was diagnostic versus just therapeutic.  In the event it was therapeutic and not diagnostic she would need consideration for robotic assisted navigational bronchoscopy with biopsies.  We have reviewed the results  of thoracentesis with her today.  They this was a diagnostic thoracentesis which was positive for malignant cells in the pleural fluid, consistent with metastatic adenocarcinoma of the lung.  We discussed that we will need to complete staging. I have ordered an MRI brain and PET scan. We will send the pleural fluid for molecular and Foundation 1 testing.   Patient was short of breath today in the office.  We did a chest x-ray to reassess whether or not she had reaccumulated fluid at the site of her previous thoracentesis on the right.  Chest x-ray did show a moderate pleural effusion on the right and a small on the left.  We have arranged for the patient to have repeat thoracentesis to interventional radiology on 09/14/2024.  Once this thoracentesis has been completed patient will need to resume her Xarelto .  Patient will follow-up in the office with me next week to reassess for reaccumulation.  If she has had reaccumulation with development of another pleural effusion we will need to consider PleurX catheter.  Patient and her daughter verbalized understanding of the above and had no further questions at completion of the visit today.  They understand they can to be getting several phone calls to schedule thoracentesis as well as consults with medical oncology.       Test Results: 09/06/2024 Cytology of Pleural Fluid A. PLEURAL FLUID, RIGHT, THORACENTESIS:    FINAL MICROSCOPIC DIAGNOSIS:  - Malignant cells present  - Consistent with metastatic adenocarcinoma of lung origin   Chest x-ray 09/05/2024 4.5 x 3.5 x  3.0 cm medial right upper lobe lung mass consistent with primary lung neoplasm. Recommend PET-CT for further evaluation and staging and referral to multi disciplinary thoracic Oncology clinic. 2. Mediastinal, right hilar, right supraclavicular and right axillary adenopathy consistent with metastatic disease. 3. Large right pleural effusion which certainly could be malignant and  thoracentesis may be yield a diagnosis. 4. Significant right middle lobe atelectasis without obvious endobronchial lesion. 5. Small left pleural effusion. 6. No findings for upper abdominal metastatic disease. 7. Aortic atherosclerosis.   Aortic Atherosclerosis (ICD10-I70.0).     Latest Ref Rng & Units 09/06/2024   10:31 AM 08/25/2024    1:49 PM 02/24/2024    1:53 PM  CBC  WBC 4.0 - 10.5 K/uL 6.2  10.3  7.5   Hemoglobin 12.0 - 15.0 g/dL 8.5  9.5  87.8   Hematocrit 36.0 - 46.0 % 26.8  29.0  35.5   Platelets 150 - 400 K/uL 321  269.0  266.0        Latest Ref Rng & Units 09/06/2024   10:31 AM 08/25/2024    1:49 PM 02/24/2024    1:53 PM  BMP  Glucose 70 - 99 mg/dL 866  97  897   BUN 8 - 23 mg/dL 24  30  22    Creatinine 0.44 - 1.00 mg/dL 8.22  8.24  8.13   Sodium 135 - 145 mmol/L 141  139  139   Potassium 3.5 - 5.1 mmol/L 4.4  5.0  4.3   Chloride 98 - 111 mmol/L 104  106  103   CO2 22 - 32 mmol/L 19  25  28    Calcium  8.9 - 10.3 mg/dL 9.0  9.0  9.6     BNP    Component Value Date/Time   BNP 645.6 (H) 09/06/2024 1031    ProBNP    Component Value Date/Time   PROBNP 695.0 (H) 08/25/2024 1349    PFT No results found for: FEV1PRE, FEV1POST, FVCPRE, FVCPOST, TLC, DLCOUNC, PREFEV1FVCRT, PSTFEV1FVCRT  IR THORACENTESIS ASP PLEURAL SPACE W/IMG GUIDE Result Date: 09/06/2024 INDICATION: Patient with right upper lobe lung mass, right pleural effusion. Consult for diagnostic and therapeutic right thoracentesis. EXAM: ULTRASOUND GUIDED DIAGNOSTIC AND THERAPEUTIC RIGHT THORACENTESIS MEDICATIONS: 8 mL 1% lidocaine  COMPLICATIONS: None immediate. PROCEDURE: An ultrasound guided thoracentesis was thoroughly discussed with the patient and questions answered. The benefits, risks, alternatives and complications were also discussed. The patient understands and wishes to proceed with the procedure. Written consent was obtained. Ultrasound was performed to localize and mark an  adequate pocket of fluid in the right chest. The area was then prepped and draped in the normal sterile fashion. 1% Lidocaine  was used for local anesthesia. Under ultrasound guidance a 6 Fr Safe-T-Centesis catheter was introduced. Thoracentesis was performed. The catheter was removed and a dressing applied. FINDINGS: A total of approximately 1 liter of slightly hazy yellow fluid was removed. IMPRESSION: Successful ultrasound guided right thoracentesis yielding 1 liter of pleural fluid. Performed and dictated by Kimble Clas, PA-C Electronically Signed   By: CHRISTELLA.  Shick M.D.   On: 09/06/2024 15:33   DG Chest 1 View Result Date: 09/06/2024 EXAM: 1 VIEW XRAY OF THE CHEST 09/06/2024 02:50:00 PM COMPARISON: Comparison same date. CLINICAL HISTORY: [id number] Status post thoracentesis [id number]. Reason for exam: S/P right thoracentesis, 1 liter removed from the right side. FINDINGS: LUNGS AND PLEURA: No focal pulmonary opacity. No pulmonary edema. Right pleural effusion is significantly smaller. Status post right-sided thoracentesis. No definite pneumothorax. HEART AND MEDIASTINUM: No  acute abnormality of the cardiac and mediastinal silhouettes. BONES AND SOFT TISSUES: No acute osseous abnormality. IMPRESSION: 1. No pneumothorax detected. 2. Marked interval decrease in right pleural effusion size. Electronically signed by: Lynwood Seip MD 09/06/2024 03:29 PM EDT RP Workstation: [id number]   CT Chest Wo Contrast Result Date: 09/06/2024 CLINICAL DATA:  Evaluate abnormal chest x-ray. EXAM: CT CHEST WITHOUT CONTRAST TECHNIQUE: Multidetector CT imaging of the chest was performed following the standard protocol without IV contrast. RADIATION DOSE REDUCTION: This exam was performed according to the departmental dose-optimization program which includes automated exposure control, adjustment of the mA and/or kV according to patient size and/or use of iterative reconstruction technique. COMPARISON:  Chest x-ray 08/25/2024 FINDINGS:  Cardiovascular: The heart is normal in size. No pericardial effusion. The aorta is within normal limits in caliber. Scattered atherosclerotic calcifications. Scattered three-vessel coronary artery calcifications. Mediastinum/Nodes: Mediastinal and right hilar adenopathy. Right paratracheal/precarinal adenopathy with maximum measurement of 18 mm. Right hilar node measures 15 mm. There is also an enlarged right supraclavicular node measuring 7 mm on image 19/2. Right axillary adenopathy measuring a maximum of 15 mm. The esophagus is grossly normal. Lungs/Pleura: There is a large medial right upper lobe lung mass. This measures approximately 4.5 x 3.5 x 3.0 cm. I do not see any definite metastatic pulmonary nodules. There is a large right pleural effusion which certainly could be malignant and thoracentesis may be held a diagnosis. Significant right middle lobe atelectasis without obvious endobronchial lesion. Lingular atelectasis is also noted. There is a small left pleural effusion. Upper Abdomen: No significant upper abdominal findings. No hepatic or adrenal gland lesions to suggest metastatic disease. No upper abdominal adenopathy. Aortic calcifications are noted. Musculoskeletal: No breast masses are identified. No bone lesions to suggest metastatic disease. IMPRESSION: 1. 4.5 x 3.5 x 3.0 cm medial right upper lobe lung mass consistent with primary lung neoplasm. Recommend PET-CT for further evaluation and staging and referral to multi disciplinary thoracic Oncology clinic. 2. Mediastinal, right hilar, right supraclavicular and right axillary adenopathy consistent with metastatic disease. 3. Large right pleural effusion which certainly could be malignant and thoracentesis may be yield a diagnosis. 4. Significant right middle lobe atelectasis without obvious endobronchial lesion. 5. Small left pleural effusion. 6. No findings for upper abdominal metastatic disease. 7. Aortic atherosclerosis. Aortic Atherosclerosis  (ICD10-I70.0). Electronically Signed   By: MYRTIS Stammer M.D.   On: 09/06/2024 12:51   DG Chest 2 View Result Date: 09/06/2024 EXAM: 2 VIEW(S) XRAY OF THE CHEST 09/06/2024 10:51:00 AM COMPARISON: 08/25/2024 CLINICAL HISTORY: sob. Per triage notes: Pt c/o chest tightness and shortness of breath for awhile. Pt has labored breathing, able to speak a few words at a time, states the pain feels like a tightness around her chest. Pt states tightness and shortness of breath have been ; getting worse.  FINDINGS: LUNGS AND PLEURA: Persistent right suprahilar mass. Patchy airspace opacities in left lung base. Increased small right pleural effusion. No pulmonary edema. No pneumothorax. HEART AND MEDIASTINUM: No acute abnormality of the cardiac and mediastinal silhouettes. BONES AND SOFT TISSUES: No acute osseous abnormality. IMPRESSION: 1. Persistent right suprahilar mass. 2. Increased small right pleural effusion. 3. Patchy airspace opacities in left lung base. Electronically signed by: Evalene Coho MD 09/06/2024 12:11 PM EDT RP Workstation: [id number]   EP STUDY Result Date: 08/30/2024 See surgical note for result.  DG Chest 2 View Result Date: 08/29/2024 CLINICAL DATA:  Dyspnea cough, congestion EXAM: CHEST - 2 VIEW COMPARISON:  10/22/2009 FINDINGS: Mild cardiomegaly.  Masslike prominence of the right aspect of the upper mediastinum. Moderate right, small left pleural effusions and associated atelectasis or consolidation. IMPRESSION: 1. Masslike prominence of the right aspect of the upper mediastinum suspicious for mass or lymphadenopathy. Recommend CT to further evaluate. 2. Moderate right, small left pleural effusions and associated atelectasis or consolidation. 3. Mild cardiomegaly. Electronically Signed   By: Marolyn JONETTA Jaksch M.D.   On: 08/29/2024 18:45     Past medical hx Past Medical History:  Diagnosis Date   ABDOMINAL INCISIONAL HERNIA 08/13/2009   s/p repair   Allergic rhinitis, cause  unspecified 09/28/2011   BRADYCARDIA 09/26/2010   CKD (chronic kidney disease), stage III (HCC) 02/26/2015   Solitary kidney   COLONIC POLYPS, HX OF 08/25/2007   Complication of anesthesia    DIVERTICULOSIS, COLON 08/25/2007   Dysrhythmia    GASTROESOPHAGEAL REFLUX DISEASE 08/04/2007   HYPERLIPIDEMIA 09/27/2010   not on Rx therapy   HYPERTENSION 08/06/2007   Hypothyroidism    INTERNAL HEMORRHOIDS 08/04/2007   LOW BACK PAIN 08/06/2007   scoliosis; DJD; gets injections   OSTEOPENIA 08/04/2007   PAF (paroxysmal atrial fibrillation) (HCC)    a.  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     Social History   Tobacco Use   Smoking status: Never    Passive exposure: Never   Smokeless tobacco: Never  Substance Use Topics   Alcohol use: No    Alcohol/week: 0.0 standard drinks of alcohol   Drug use: No    Ms.Eddinger reports that she has never smoked. She has never been exposed to tobacco smoke. She has never used smokeless tobacco. She reports that she does not drink alcohol and does not use drugs.  Tobacco Cessation: Counseling given: Not Answered   Past surgical hx, Family hx, Social hx all reviewed.  Current Outpatient Medications on File Prior to Visit  Medication Sig   acetaminophen  (TYLENOL ) 500 MG tablet Take 500 mg by mouth every 4 (four) hours as needed for moderate pain (pain score 4-6) or mild pain (pain score 1-3).   amLODipine  (NORVASC ) 2.5 MG tablet TAKE 1 TABLET BY MOUTH IN THE MORNING AND AT BEDTIME   Ascorbic Acid (VITAMIN C) 1000 MG tablet Take 1,000 mg by mouth daily.   furosemide  (LASIX ) 20 MG tablet Take 1 tablet (20 mg total) by mouth every other day.   levothyroxine  (SYNTHROID ) 50 MCG tablet TAKE 1 TABLET BY MOUTH ONCE DAILY BEFORE BREAKFAST APPOINTMENT  REQUIRED  FOR  FUTURE   REFILLS   LUMIGAN 0.01 % SOLN Place 1 drop into both eyes at bedtime.    metoprolol  tartrate (LOPRESSOR ) 25 MG tablet Take 3 tablets (75 mg total) by mouth daily.   Multiple Vitamins-Minerals (PRESERVISION AREDS 2 PO) Take 1 tablet by mouth daily.   Multiple Vitamins-Minerals (WOMENS 50+ MULTI VITAMIN/MIN PO) Take 1 tablet by mouth daily.   Omega-3 Fatty Acids (FISH OIL TRIPLE STRENGTH) 1400 MG CAPS Take 1,400 mg by mouth daily.   pantoprazole  (PROTONIX ) 40 MG tablet Take 1 tablet (40 mg total) by mouth daily. TAKE 1 TABLET BY MOUTH ONCE DAILY   Perfluorohexyloctane (MIEBO) 1.338 GM/ML SOLN Place 1.338 mLs into both eyes 4 (four) times daily.   potassium chloride  (KLOR-CON  M) 10 MEQ tablet Take 1 tablet by mouth once daily  vitamin k 100 MCG tablet Take 100 mcg by mouth daily.   [Paused] Rivaroxaban  (XARELTO ) 15 MG TABS tablet TAKE 1 TABLET BY MOUTH ONCE DAILY WITH SUPPER (Patient not taking: Reported on 09/12/2024)   No current facility-administered medications on file prior to visit.     Allergies  Allergen Reactions   Budesonide Other (See Comments)    Causes nasal bleeding   Elavil  [Amitriptyline ] Other (See Comments)    Feels bad   Gabapentin  Other (See Comments)    Loopy effect   Tramadol  Other (See Comments)    Loopy feeling   Hydrocodone Nausea Only and Other (See Comments)    lethargy   Ipratropium     Causes nasal bleeding    Review Of Systems:  Constitutional:   No  weight loss, night sweats,  Fevers, chills, +fatigue, or  lassitude.  HEENT:   No headaches,  Difficulty swallowing,  Tooth/dental problems, or  Sore throat,                No sneezing, itching, ear ache, nasal congestion, post nasal drip,   CV:  No chest pain,  Orthopnea, PND, swelling in lower extremities, anasarca, dizziness, palpitations, syncope.   GI  No heartburn, indigestion, abdominal pain, nausea, vomiting, diarrhea, change in bowel habits, loss of appetite, bloody stools.   Resp: + shortness  of breath with exertion less at rest.  No excess mucus, no productive cough,  No non-productive cough,  No coughing up of blood.  No change in color of mucus.  No wheezing.  No chest wall deformity  Skin: no rash or lesions.  GU: no dysuria, change in color of urine, no urgency or frequency.  No flank pain, no hematuria   MS:  No joint pain or swelling.  + decreased range of motion.  No back pain.  Psych:  No change in mood or affect. No depression or anxiety.  No memory loss.   Vital Signs BP 130/80   Pulse 68   Temp (!) 97.4 F (36.3 C) (Temporal)   Ht 5' 6 (1.676 m)   Wt 143 lb 9.6 oz (65.1 kg)   SpO2 96%   BMI 23.18 kg/m    Physical Exam:  General- No distress,  A&Ox3, pleasant ENT: No sinus tenderness, TM clear, pale nasal mucosa, no oral exudate,no post nasal drip, no LAN Cardiac: S1, S2, regular rate and rhythm, no murmur Chest: No wheeze/ rales/ dullness; no accessory muscle use, no nasal flaring, no sternal retractions, diminished per bases R>L Abd.: Soft Non-tender, ND, BS +, Body mass index is 23.18 kg/m.  Ext: No clubbing cyanosis, edema No obvious deformities Neuro:  normal strength, MAE x 4, A&O x 3 Skin: No rashes, warm and dry, no obvious skin lesions Psych: normal mood and behavior  Physical Exam    Assessment/Plan  New diagnosis adenocarcinoma of the lung Former remote smoker Recent thoracentesis for right-sided pleural effusion Plan We have reviewed the results of your pleural fluid cytology. This was positive for malignant cells consistent with metastatic adenocarcinoma of the lung We will order both a PET scan, and a MRI of the brain to stage her cancer.SABRA You will get phone calls to get this scheduled. I will also make a referral to medical oncology. I will talk to Dr. Shelah today regarding best options for tissue sampling. The better option may be the axillary node that was noted on the CT scan. I will call you and let you know what the best  option is and we will make arrangements to have this done. We will do a chest x-ray today to evaluate if you have reaccumulated fluid in the right side of your lung. I will call you with the results of the chest x-ray, and based on that we will determine if you need another thoracentesis to drain. If we are not going to repeat a procedure we would need you to start back on your Xarelto . We will call your daughter Zaryia Markel [phone] to schedule all procedures, and to let you know the CXR results. Please contact office for sooner follow up if symptoms do not improve or worsen or seek emergency care    I spent 40 minutes dedicated to the care of this patient on the date of this encounter to include pre-visit review of records, face-to-face time with the patient discussing conditions above, post visit ordering of testing, clinical documentation with the electronic health record, making appropriate referrals as documented, and communicating necessary information to the patient's healthcare team.  Assessment & Plan        Lauraine JULIANNA Lites, NP 09/12/2024  9:39 AM

## 2024-09-13 ENCOUNTER — Telehealth: Payer: Self-pay

## 2024-09-13 NOTE — Telephone Encounter (Signed)
 Copied from CRM [id number]. Topic: Clinical - Medical Advice >> Sep 13, 2024 11:16 AM Celestine FALCON wrote: Reason for CRM: Pt's daughter Mearl [place]arewood [phone] is calling to request a call from NP Lauraine Lites on direction regarding the medication Carolyn Wells.  She stated her mother is having a thoracentesis completed tomorrow morning and she is off of the [place], but her heart doctor Dr. Kriste is requesting her to begin taking the Xareilto as soon as possible and it was ultimately up to NP Lauraine Lites for when the pt starts the medication again.  [name] stated the pt has MRI and PET scheduled for 09/19/2024 at [facility] and wanted to know if taking or not taking this medication can affect those scans at all.  Please call her back at [phone] ok to leave a vm.  Please advise

## 2024-09-13 NOTE — Telephone Encounter (Signed)
 I spoke w the patient's daughter and read to her the information from Dr. Kriste.  She states the patient is having thoracentesis tomorrow morning.  She will ask about how soon it will be safe to restart Xarelto  while there.   I let her know Dr. Kriste also forwarded the information to Lauraine Lites, NP as well.   No other needs from cardiology at this time.

## 2024-09-13 NOTE — Telephone Encounter (Signed)
 Carolyn Lauraine FALCON, NP to Carolyn Wells, CMA  [facility]  Carolyn Wells, [place]      09/13/24  4:33 PM She will need to restart her blood thinner after the thoracentesis, as long as there is not a complication during the procedure. Please have daughter check with the interventional radiologist after the procedure. But this should be after the procedure tomorrow, so we can get her back on her blood thinner. . Thanks   I called and spoke with the pt's daughter and notified of response per Lauraine. She verbalized understanding. Nothing further needed.

## 2024-09-14 ENCOUNTER — Other Ambulatory Visit: Payer: Self-pay | Admitting: Acute Care

## 2024-09-14 ENCOUNTER — Telehealth: Payer: Self-pay | Admitting: Acute Care

## 2024-09-14 ENCOUNTER — Ambulatory Visit (HOSPITAL_COMMUNITY)
Admission: RE | Admit: 2024-09-14 | Discharge: 2024-09-14 | Disposition: A | Discharge status: Home / Self Care | Source: Ambulatory Visit | Attending: Acute Care | Admitting: Acute Care

## 2024-09-14 ENCOUNTER — Other Ambulatory Visit: Payer: Self-pay

## 2024-09-14 ENCOUNTER — Other Ambulatory Visit (HOSPITAL_BASED_OUTPATIENT_CLINIC_OR_DEPARTMENT_OTHER): Payer: Self-pay

## 2024-09-14 ENCOUNTER — Telehealth: Payer: Self-pay | Admitting: Internal Medicine

## 2024-09-14 DIAGNOSIS — C3491 Malignant neoplasm of unspecified part of right bronchus or lung: Secondary | ICD-10-CM

## 2024-09-14 DIAGNOSIS — C3490 Malignant neoplasm of unspecified part of unspecified bronchus or lung: Secondary | ICD-10-CM

## 2024-09-14 DIAGNOSIS — J90 Pleural effusion, not elsewhere classified: Secondary | ICD-10-CM | POA: Insufficient documentation

## 2024-09-14 HISTORY — PX: IR THORACENTESIS ASP PLEURAL SPACE W/IMG GUIDE: IMG5380

## 2024-09-14 MED ORDER — POTASSIUM CHLORIDE CRYS ER 10 MEQ PO TBCR: 10.0000 meq | EXTENDED_RELEASE_TABLET | ORAL | 3 refills | Status: DC

## 2024-09-14 MED ORDER — LIDOCAINE-EPINEPHRINE (PF) 1 %-1:200000 IJ SOLN: 10.0000 mL | Freq: Once | INTRAMUSCULAR | Status: DC

## 2024-09-14 MED ORDER — FUROSEMIDE 20 MG PO TABS: 20.0000 mg | ORAL_TABLET | ORAL | 3 refills | Status: DC

## 2024-09-14 MED ORDER — LIDOCAINE-EPINEPHRINE 1 %-1:100000 IJ SOLN
INTRAMUSCULAR | Status: AC
  Filled 2024-09-14: qty 1

## 2024-09-14 NOTE — Progress Notes (Signed)
 The proposed treatment discussed in conference is for discussion purpose only and is not a binding recommendation.  The patients have not been physically examined, or presented with their treatment options.  Therefore, final treatment plans cannot be decided.

## 2024-09-14 NOTE — Telephone Encounter (Signed)
 Pts daughter requesting a c/b in regards to previous phone note.

## 2024-09-14 NOTE — Telephone Encounter (Signed)
*  STAT* If patient is at the pharmacy, call can be transferred to refill team.   1. Which medications need to be refilled? (please list name of each medication and dose if known)   furosemide  (LASIX ) 20 MG tablet  potassium chloride  (KLOR-CON  M) 10 MEQ tablet   2. Would you like to learn more about the convenience, safety, & potential cost savings by using the [facility]?   3. Are you open to using the [facility] (Type [facility]. ).  4. Which pharmacy/location (including street and city if local pharmacy) is medication to be sent to?  [id number] - [place], [place] - [address]   5. Do they need a 30 day or 90 day supply?   90 day  Patient stated she is completely out of these medications.   Patient has appointment with Dr. Kriste on 11/24.

## 2024-09-14 NOTE — Telephone Encounter (Signed)
 Refill for Lasix  and Potassium has been sent.

## 2024-09-14 NOTE — Telephone Encounter (Signed)
 I called the patient's daughter to let her know we can send pleural fluid for molecular, PDL 1 and CMET to evaluate for positive markers. This means the patient should not have to have an additional biopsy.We will see the patient back in 1 week in the office with a CXR to see if she re accumulates into the right pleural space.She will most likely need a pleurex catheter placed .

## 2024-09-14 NOTE — Telephone Encounter (Addendum)
 Called patient's daughter, [place]. She wanted to cancel stress test due to her mom going through a lot right now. Canceled stress test. Will let that department know that she will call back when they are ready to reschedule. Will keep echo scheduled.

## 2024-09-14 NOTE — Procedures (Signed)
 PROCEDURE SUMMARY:  Successful image-guided diagnostic and therapeutic thoracentesis from the right chest.  Yielded 950 milliliters of clear, yellow fluid.  No immediate complications.  EBL: zero Patient tolerated well.   Specimen sent for labs.  Post-procedure CXR ordered and reviewed prior to departure from department.   Please see imaging section of Epic for full dictation.  Nasiya Pascual NP 09/14/2024 10:11 AM

## 2024-09-15 LAB — CYTOLOGY - NON PAP

## 2024-09-17 ENCOUNTER — Encounter (HOSPITAL_COMMUNITY): Payer: Self-pay | Admitting: Emergency Medicine

## 2024-09-17 ENCOUNTER — Emergency Department (HOSPITAL_COMMUNITY)

## 2024-09-17 ENCOUNTER — Other Ambulatory Visit: Payer: Self-pay

## 2024-09-17 ENCOUNTER — Emergency Department (HOSPITAL_COMMUNITY)
Admission: EM | Admit: 2024-09-17 | Discharge: 2024-09-17 | Disposition: A | Discharge status: Home / Self Care | Attending: Emergency Medicine | Admitting: Emergency Medicine

## 2024-09-17 ENCOUNTER — Encounter: Payer: Self-pay | Admitting: Acute Care

## 2024-09-17 DIAGNOSIS — R0602 Shortness of breath: Secondary | ICD-10-CM | POA: Insufficient documentation

## 2024-09-17 DIAGNOSIS — Z85118 Personal history of other malignant neoplasm of bronchus and lung: Secondary | ICD-10-CM | POA: Insufficient documentation

## 2024-09-17 DIAGNOSIS — Z7901 Long term (current) use of anticoagulants: Secondary | ICD-10-CM | POA: Diagnosis not present

## 2024-09-17 LAB — BASIC METABOLIC PANEL WITH GFR
Anion gap: 13 (ref 5–15)
BUN: 21 mg/dL (ref 8–23)
CO2: 21 mmol/L — ABNORMAL LOW (ref 22–32)
Calcium: 9 mg/dL (ref 8.9–10.3)
Chloride: 106 mmol/L (ref 98–111)
Creatinine, Ser: 1.72 mg/dL — ABNORMAL HIGH (ref 0.44–1.00)
GFR, Estimated: 29 mL/min — ABNORMAL LOW (ref >60)
Glucose, Bld: 109 mg/dL — ABNORMAL HIGH (ref 70–99)
Potassium: 4.1 mmol/L (ref 3.5–5.1)
Sodium: 140 mmol/L (ref 135–145)

## 2024-09-17 LAB — BRAIN NATRIURETIC PEPTIDE: B Natriuretic Peptide: 618.7 pg/mL — ABNORMAL HIGH (ref 0.0–100.0)

## 2024-09-17 LAB — CBC
HCT: 28.6 % — ABNORMAL LOW (ref 36.0–46.0)
Hemoglobin: 8.9 g/dL — ABNORMAL LOW (ref 12.0–15.0)
MCH: 30.4 pg (ref 26.0–34.0)
MCHC: 31.1 g/dL (ref 30.0–36.0)
MCV: 97.6 fL (ref 80.0–100.0)
Platelets: 288 K/uL (ref 150–400)
RBC: 2.93 MIL/uL — ABNORMAL LOW (ref 3.87–5.11)
RDW: 13.8 % (ref 11.5–15.5)
WBC: 7.5 K/uL (ref 4.0–10.5)
nRBC: 0 % (ref 0.0–0.2)

## 2024-09-17 LAB — TROPONIN I (HIGH SENSITIVITY)
Troponin I (High Sensitivity): 23 ng/L — ABNORMAL HIGH (ref <18)
Troponin I (High Sensitivity): 19 ng/L — ABNORMAL HIGH (ref <18)

## 2024-09-17 NOTE — Discharge Instructions (Signed)
 Return for any problem.  ?

## 2024-09-17 NOTE — ED Provider Notes (Signed)
 [facility] AT [facility] Provider Note   CSN: [id number] Arrival date & time: 09/17/24  1051     Patient presents with: Shortness of Breath   Carolyn Wells is a 83 y.o. female.   83 year old female with prior medical history as detailed below presents for evaluation.  Patient reports recent diagnosis of right sided lung cancer.  She reports that she has had thoracentesis x 2.  Last thoracentesis was on Thursday.  1 L was removed.  Patient is concerned about recurrent fluid buildup.  She reports feeling short of breath.  She denies fever.  She denies pain.  She has not yet started chemotherapy/treatment for her lung cancer.  The history is provided by the patient and medical records.       Prior to Admission medications   Medication Sig Start Date End Date Taking? Authorizing Provider  acetaminophen  (TYLENOL ) 500 MG tablet Take 500 mg by mouth every 4 (four) hours as needed for moderate pain (pain score 4-6) or mild pain (pain score 1-3).    [provider]  amLODipine  (NORVASC ) 2.5 MG tablet TAKE 1 TABLET BY MOUTH IN THE MORNING AND AT BEDTIME 09/04/24   Segal, Jared E, MD  Ascorbic Acid (VITAMIN C) 1000 MG tablet Take 1,000 mg by mouth daily.    [provider]  furosemide  (LASIX ) 20 MG tablet Take 1 tablet (20 mg total) by mouth every other day. 09/14/24   Segal, Jared E, MD  levothyroxine  (SYNTHROID ) 50 MCG tablet TAKE 1 TABLET BY MOUTH ONCE DAILY BEFORE BREAKFAST APPOINTMENT  REQUIRED  FOR  FUTURE  REFILLS 09/04/24   [name], MD  LUMIGAN 0.01 % SOLN Place 1 drop into both eyes at bedtime.  08/08/15   [provider]  metoprolol  tartrate (LOPRESSOR ) 25 MG tablet Take 3 tablets (75 mg total) by mouth daily. 02/24/24   [name], MD  Multiple Vitamins-Minerals (PRESERVISION AREDS 2 PO) Take 1 tablet by mouth daily.    [provider]  Multiple Vitamins-Minerals (WOMENS 50+ MULTI VITAMIN/MIN PO) Take 1  tablet by mouth daily.    [provider]  Omega-3 Fatty Acids (FISH OIL TRIPLE STRENGTH) 1400 MG CAPS Take 1,400 mg by mouth daily.    [provider]  pantoprazole  (PROTONIX ) 40 MG tablet Take 1 tablet (40 mg total) by mouth daily. TAKE 1 TABLET BY MOUTH ONCE DAILY 05/08/24   [name], MD  Perfluorohexyloctane (MIEBO) 1.338 GM/ML SOLN Place 1.338 mLs into both eyes 4 (four) times daily.    [provider]  potassium chloride  (KLOR-CON  M) 10 MEQ tablet Take 1 tablet (10 mEq total) by mouth every other day. 09/14/24   Segal, Jared E, MD  Rivaroxaban  (XARELTO ) 15 MG TABS tablet TAKE 1 TABLET BY MOUTH ONCE DAILY WITH SUPPER Patient not taking: Reported on 09/12/2024 05/08/24   Nahser, Aleene PARAS, MD  vitamin k 100 MCG tablet Take 100 mcg by mouth daily.    [provider]    Allergies: Budesonide, Elavil  [amitriptyline ], Gabapentin , Tramadol , Hydrocodone, and Ipratropium    Review of Systems  All other systems reviewed and are negative.   Updated Vital Signs BP 123/76 (BP Location: Right Arm)   Pulse 71   Temp 97.6 F (36.4 C)   Resp 15   SpO2 100%   Physical Exam Vitals and nursing note reviewed.  Constitutional:      General: She is not in acute distress.    Appearance: She  is well-developed.  HENT:     Head: Normocephalic and atraumatic.  Eyes:     Conjunctiva/sclera: Conjunctivae normal.  Cardiovascular:     Rate and Rhythm: Normal rate and regular rhythm.     Heart sounds: No murmur heard. Pulmonary:     Effort: Pulmonary effort is normal. No respiratory distress.     Breath sounds: Normal breath sounds.  Abdominal:     Palpations: Abdomen is soft.     Tenderness: There is no abdominal tenderness.  Musculoskeletal:        General: No swelling.     Cervical back: Neck supple.  Skin:    General: Skin is warm and dry.     Capillary Refill: Capillary refill takes less than 2 seconds.  Neurological:     Mental Status: She is alert.   Psychiatric:        Mood and Affect: Mood normal.     (all labs ordered are listed, but only abnormal results are displayed) Labs Reviewed  CBC - Abnormal; Notable for the following components:      Result Value   RBC 2.93 (*)    Hemoglobin 8.9 (*)    HCT 28.6 (*)    All other components within normal limits  BASIC METABOLIC PANEL WITH GFR  BRAIN NATRIURETIC PEPTIDE  TROPONIN I (HIGH SENSITIVITY)    EKG: EKG Interpretation Date/Time:  Sunday September 17 2024 11:10:55 EST Ventricular Rate:  80 PR Interval:    QRS Duration:  74 QT Interval:  360 QTC Calculation: 415 R Axis:   -1  Text Interpretation: Atrial fibrillation Anterior infarct , age undetermined Abnormal ECG When compared with ECG of 06-Sep-2024 10:23, PREVIOUS ECG IS PRESENT Confirmed by Laurice Coy [phone]) on 09/17/2024 11:45:43 AM  Radiology: No results found.   Procedures   Medications Ordered in the ED - No data to display                                  Medical Decision Making Patient with known history of recent diagnosis of lung cancer.  She has had 2 thoracenteses performed on the right secondary to buildup of malignant pleural effusion.  She is concerned about recollection of fluid.  Obtained x-ray does not demonstrate significant right-sided pleural effusion.  Other labs obtained are reassuringly without significant acute abnormality.  Patient is not hypoxic.  She appears to be in no distress.  On reevaluation she is comfortable and reassured by workup findings.  She understands need for close outpatient follow-up.  Strict return precautions given and understood.  Amount and/or Complexity of Data Reviewed Labs: ordered. Radiology: ordered.        Final diagnoses:  Shortness of breath    ED Discharge Orders     None          Laurice Coy BROCKS, MD 09/17/24 1517

## 2024-09-17 NOTE — ED Triage Notes (Signed)
 Pt here for SOB beginning early this AM.   Hx of tumor in lung. In process of oncology consult.  Pt had thoracentesis on Thursday.  Discussed possibility of Chest tube. Concerned for fluid build up causing SOB.

## 2024-09-17 NOTE — ED Notes (Signed)
 Patient Alert and oriented to baseline. Stable and ambulatory to baseline. Patient verbalized understanding of the discharge instructions.  Patient belongings were taken by the patient.

## 2024-09-17 NOTE — ED Notes (Signed)
 CCMD notified of need to monitor patient.

## 2024-09-17 NOTE — ED Notes (Signed)
 Pt asking if we could skip blood work, explained to Pt we need the labs to treat her, did stick Pt one time and unable to obtain

## 2024-09-17 NOTE — ED Notes (Signed)
 Patient transported to X-ray

## 2024-09-19 ENCOUNTER — Ambulatory Visit (HOSPITAL_COMMUNITY)
Admission: RE | Admit: 2024-09-19 | Discharge: 2024-09-19 | Disposition: A | Discharge status: Home / Self Care | Source: Ambulatory Visit | Attending: Acute Care | Admitting: Acute Care

## 2024-09-19 ENCOUNTER — Encounter (HOSPITAL_COMMUNITY)
Admission: RE | Admit: 2024-09-19 | Discharge: 2024-09-19 | Disposition: A | Discharge status: Home / Self Care | Source: Ambulatory Visit | Attending: Acute Care | Admitting: Acute Care

## 2024-09-19 DIAGNOSIS — C3490 Malignant neoplasm of unspecified part of unspecified bronchus or lung: Secondary | ICD-10-CM

## 2024-09-19 LAB — GLUCOSE, CAPILLARY: Glucose-Capillary: 89 mg/dL (ref 70–99)

## 2024-09-19 MED ORDER — GADOBUTROL 1 MMOL/ML IV SOLN
6.5000 mL | Freq: Once | INTRAVENOUS | Status: AC | PRN
  Administered 2024-09-19: 6.5 mL via INTRAVENOUS

## 2024-09-19 MED ORDER — FLUDEOXYGLUCOSE F - 18 (FDG) INJECTION
7.1000 | Freq: Once | INTRAVENOUS | Status: AC
  Administered 2024-09-19: 7.1 via INTRAVENOUS

## 2024-09-20 ENCOUNTER — Telehealth: Payer: Self-pay | Admitting: Acute Care

## 2024-09-20 ENCOUNTER — Ambulatory Visit (INDEPENDENT_AMBULATORY_CARE_PROVIDER_SITE_OTHER): Admitting: Acute Care

## 2024-09-20 ENCOUNTER — Encounter: Payer: Self-pay | Admitting: Acute Care

## 2024-09-20 ENCOUNTER — Ambulatory Visit (INDEPENDENT_AMBULATORY_CARE_PROVIDER_SITE_OTHER)

## 2024-09-20 VITALS — BP 127/71 | HR 71 | Temp 97.5°F

## 2024-09-20 DIAGNOSIS — J90 Pleural effusion, not elsewhere classified: Secondary | ICD-10-CM | POA: Diagnosis not present

## 2024-09-20 DIAGNOSIS — C3491 Malignant neoplasm of unspecified part of right bronchus or lung: Secondary | ICD-10-CM

## 2024-09-20 DIAGNOSIS — R9389 Abnormal findings on diagnostic imaging of other specified body structures: Secondary | ICD-10-CM

## 2024-09-20 DIAGNOSIS — R0609 Other forms of dyspnea: Secondary | ICD-10-CM | POA: Diagnosis not present

## 2024-09-20 DIAGNOSIS — R911 Solitary pulmonary nodule: Secondary | ICD-10-CM

## 2024-09-20 DIAGNOSIS — C3401 Malignant neoplasm of right main bronchus: Secondary | ICD-10-CM

## 2024-09-20 DIAGNOSIS — R942 Abnormal results of pulmonary function studies: Secondary | ICD-10-CM

## 2024-09-20 NOTE — Progress Notes (Signed)
 History of Present Illness AUDRIELLE VANKUREN is a 83 y.o. female remote former smoker referred to pulmonary after an incidental finding of a RUL lung mass with adenopathy concerning for malignancy, as well as a large right pleural effusion. She will be followed by Dr. Shelah , and the IP team.   Synopsis 83 y/o woman with a history of very remote tobacco abuse, A-fib, chronic dyspnea who presented for evaluation of chest tightness and chronic shortness of breath. She has a history of Afib who underwent cardioversion about a week ago; she has remained on Xarelto . She Recently had a chest CT performed as an outpatient yesterday for evaluation of her dyspnea. In the ED she had her CT reviewed demonstrating a lung mass and a R dependent pleural effusion. She is a remote 12 year smoker, quitting in her late 67s. No personal or family history of cancers. Single kidney due to donation to her husband in the past. She had 1L pleural fluid drained by IR 09/06/2024 and had improvement in her SOB . No weight loss, normal appetite.    She underwent a thoracentesis 09/06/2024 of the right effusion which was both therapeutic and diagnostic for Malignant cells Consistent with metastatic adenocarcinoma of lung origin. She had a subsequent repeat right thoracentesis 09/14/2024 for re accumulation of pleural fluid. Both samples were sent for Foundation 1 and molecular testing.   Pt is here today for follow up CXR to ensure she does not have another pleural effusion.   09/20/2024 Discussed the use of AI scribe software for clinical note transcription with the patient, who gave verbal consent to proceed.  History of Present Illness  Pt. Presents for a follow up CXR to ensure her right sided pleural effusion has not re accumulated after her thoracentesis on 09/14/2024. They were able to pull  950 cc's of clear yellow fluid from the right effusion.  She was in the ER on 09/19/2024,  where a chest x-ray showed there was  no re accumulation of fluid., which was reassuring.     CXR today shows  streaky bibasilar airspace opacities, likely atelectasis, trace left pleural effusion, and the known Right upper lobe mass abutting the mediastinum, which is similar to her prior imaging. However PET scan done yesterday does show a moderate pleural effusion on the right. Pt. Is on Lasix , this may explain the change from one day to the next. This may also be atelectasis.Per her daughter , patient does look good today.   She is awaiting complete staging with MR Brain and PET scan. Both have been completed and she has follow up with Dr. Sherrod 09/26/2024 to discuss results and determine best plan of care moving forward. PET scan just resulted, which shows concern for hypermetabolic adenopathy,extending to the left supraclavicular region  She does endorse some chest tightness and dyspnea when bending over and when she gets exerted. I have encouraged her not to over do until she sees Dr. Sherrod for follow up. I have also encouraged her not to miss her lasix  dosing, as this may be helping with the effusion.   She has a good appetite and enjoys eating homemade biscuits and pizza.     Test Results: CXR 09/20/2024 Streaky bibasilar airspace opacities, likely atelectasis. Trace left pleural effusion. Right upper lobe mass abutting the mediastinum, similar to prior exam.  PET Scan 09/19/2024 read 11:10 am Hypermetabolic right upper lobe mass with hypermetabolic adenopathy extending to the left supraclavicular region, findings compatible with at least T2bN3M0  or stage IIIB primary bronchogenic carcinoma. Hypermetabolic nodular consolidation in the anterior left lower lobe may be infectious/inflammatory in etiology. Metastatic disease or stage IV A disease cannot be definitively excluded. 2. Moderate right and small left pleural effusions. 3. Aortic atherosclerosis (ICD10-I70.0). Coronary artery calcification.  09/06/2024 Pleural  Fluid Cytology Cytology of Pleural Fluid A. PLEURAL FLUID, RIGHT, THORACENTESIS:    FINAL MICROSCOPIC DIAGNOSIS:  - Malignant cells present  - Consistent with metastatic adenocarcinoma of lung origin    Right Thoracentesis 09/14/2024 >> 950 cc's clear yellow fluid. Right Thoracentesis 09/06/2024>> 1 Liter  hazy yellow pleural fluid.    Latest Ref Rng & Units 09/17/2024   11:13 AM 09/06/2024   10:31 AM 08/25/2024    1:49 PM  CBC  WBC 4.0 - 10.5 K/uL 7.5  6.2  10.3   Hemoglobin 12.0 - 15.0 g/dL 8.9  8.5  9.5   Hematocrit 36.0 - 46.0 % 28.6  26.8  29.0   Platelets 150 - 400 K/uL 288  321  269.0        Latest Ref Rng & Units 09/17/2024   11:13 AM 09/06/2024   10:31 AM 08/25/2024    1:49 PM  BMP  Glucose 70 - 99 mg/dL 890  866  97   BUN 8 - 23 mg/dL 21  24  30    Creatinine 0.44 - 1.00 mg/dL 8.27  8.22  8.24   Sodium 135 - 145 mmol/L 140  141  139   Potassium 3.5 - 5.1 mmol/L 4.1  4.4  5.0   Chloride 98 - 111 mmol/L 106  104  106   CO2 22 - 32 mmol/L 21  19  25    Calcium  8.9 - 10.3 mg/dL 9.0  9.0  9.0     BNP    Component Value Date/Time   BNP 618.7 (H) 09/17/2024 1130    ProBNP    Component Value Date/Time   PROBNP 695.0 (H) 08/25/2024 1349    PFT No results found for: FEV1PRE, FEV1POST, FVCPRE, FVCPOST, TLC, DLCOUNC, PREFEV1FVCRT, PSTFEV1FVCRT  DG Chest 2 View Result Date: 09/20/2024 EXAM: 2 VIEW(S) XRAY OF THE CHEST 09/20/2024 10:58:24 AM COMPARISON: 09/17/2024 CLINICAL HISTORY: pleural effusion FINDINGS: LUNGS AND PLEURA: Similarly appearing, right upper lobe mass abutting the mediastinum. Small left pleural effusion. Streaky opacities in both lung bases, likely atelectasis. No pulmonary edema. No pneumothorax. HEART AND MEDIASTINUM: The right upper lobe mass abuts the mediastinum. No acute abnormality of the cardiac silhouette. BONES AND SOFT TISSUES: No acute osseous abnormality. IMPRESSION: 1. Streaky bibasilar airspace opacities, likely  atelectasis. Trace left pleural effusion. 2. Right upper lobe mass abutting the mediastinum, similar to prior exam. Electronically signed by: Rogelia Myers MD 09/20/2024 11:31 AM EST RP Workstation: [id number]   NM PET Image Initial (PI) Skull Base To Thigh Result Date: 09/20/2024 CLINICAL DATA:  Initial treatment strategy for non-small cell lung cancer. EXAM: NUCLEAR MEDICINE PET SKULL BASE TO THIGH TECHNIQUE: 7.1 mCi F-18 FDG was injected intravenously. Full-ring PET imaging was performed from the skull base to thigh after the radiotracer. CT data was obtained and used for attenuation correction and anatomic localization. Fasting blood glucose: 89 mg/dl COMPARISON:  CT chest 89/71/7974. FINDINGS: Mediastinal blood pool activity: SUV max 2.8 Liver activity: SUV max NA NECK: No abnormal hypermetabolism. Incidental CT findings: None. CHEST: Hypermetabolic supraclavicular, mediastinal, left hilar, right axillary, right internal mammary and prepericardiac lymph nodes. Index AP window lymph node measures 1.4 cm (4/68), SUV max 8.4. Medial right upper lobe mass  has a long border of contact with the adjacent mediastinum and measures 3.0 x 4.4 cm with SUV max 12.1. Hypermetabolic nodular consolidation in the anterolateral left lower lobe measures approximately 10 mm (7/59), SUV max 3.1. No additional abnormal hypermetabolism. Incidental CT findings: Atherosclerotic calcification of the aorta, aortic valve and coronary arteries. Heart is enlarged. No pericardial effusion. Moderate right and small left pleural effusions. Volume loss in the lateral segment right middle lobe and inferior lingula. ABDOMEN/PELVIS: No abnormal hypermetabolism. Incidental CT findings: Right kidney is absent. Low-attenuation lesion in the left kidney. No specific follow-up necessary. SKELETON: No abnormal hypermetabolism. Incidental CT findings: Degenerative changes in the spine. IMPRESSION: 1. Hypermetabolic right upper lobe mass with  hypermetabolic adenopathy extending to the left supraclavicular region, findings compatible with at least T2bN3M0 or stage IIIB primary bronchogenic carcinoma. Hypermetabolic nodular consolidation in the anterior left lower lobe may be infectious/inflammatory in etiology. Metastatic disease or stage IVA disease cannot be definitively excluded. 2. Moderate right and small left pleural effusions. 3. Aortic atherosclerosis (ICD10-I70.0). Coronary artery calcification. Electronically Signed   By: Newell Eke M.D.   On: 09/20/2024 11:10   MR BRAIN W WO CONTRAST Result Date: 09/20/2024 CLINICAL DATA:  Non-small cell lung cancer.  Staging. EXAM: MRI HEAD WITHOUT AND WITH CONTRAST TECHNIQUE: Multiplanar, multiecho pulse sequences of the brain and surrounding structures were obtained without and with intravenous contrast. CONTRAST:  6.5mL GADAVIST GADOBUTROL 1 MMOL/ML IV SOLN COMPARISON:  Head CT 06/27/2023 FINDINGS: Brain: No focal finding affects the brainstem or cerebellum. Mild cerebellar volume loss, age related. Cerebral hemispheres show age related volume loss without subjective lobar predominance. There is a background pattern of mild chronic small-vessel ischemic change of the deep white matter. There are 3 subcentimeter foci of restricted diffusion in the right frontal lobe. Two of these are punctate and the larger focus in the medial inferior frontal lobe measures up to 8 mm. There is no edema or enhancement associated with these foci and they are favored to represent small embolic strokes. Nonenhancing metastases are possible but less likely. The clustered nature favors vascular pathology. No evidence of any other brain metastasis. There is an incidental meningioma measuring 1.8 cm in diameter at the frontoparietal vertex on the left. Minimal indentation of the surface of the brain but no significant mass effect. Broad surface along the superior sagittal sinus but without evidence of sinus invasion or  occlusion. No hydrocephalus or extra-axial collection. Vascular: Major vessels at the base of the brain show flow. Skull and upper cervical spine: Negative Sinuses/Orbits: Clear/normal Other: None IMPRESSION: 1. 3 subcentimeter foci of restricted diffusion in the right frontal lobe. Two of these are punctate and the larger focus in the medial inferior frontal lobe measures up to 8 mm. There is no edema or enhancement associated with these foci and they are favored to represent recent small embolic strokes. Nonenhancing metastases are possible but less likely. The clustered nature favors vascular pathology. 2. No evidence of any other brain metastasis. 3. 1.8 cm meningioma at the frontoparietal vertex on the left. Minimal indentation of the surface of the brain but no significant mass effect. Broad surface along the superior sagittal sinus but without evidence of sinus invasion or occlusion. No apparent change compared to the CT scan August 2024. Electronically Signed   By: Oneil Officer M.D.   On: 09/20/2024 11:00   DG Chest 1 View Result Date: 09/18/2024 CLINICAL DATA:  Status post right thoracentesis EXAM: CHEST  1 VIEW COMPARISON:  Chest  x-ray performed September 06, 2024 FINDINGS: No pneumothorax. Improvement in aeration of the right lung with trace residual pleural fluid. Heart size is unchanged. Right upper lobe lesion is unchanged. Degenerative changes in the imaged osseous structures. IMPRESSION: 1. Improvement in aeration of the right lung without pneumothorax. 2. Stable appearance of right lung lesion. Electronically Signed   By: Maude Naegeli M.D.   On: 09/18/2024 07:42   DG Chest 2 View Result Date: 09/17/2024 EXAM: 2 VIEW(S) XRAY OF THE CHEST 09/17/2024 11:59:09 AM COMPARISON: 09/14/2024 CLINICAL HISTORY: sob, cp FINDINGS: LUNGS AND PLEURA: Lateral view degraded by patient arm position, not raised above the head. Hyperinflation. Increased right and persistent left base airspace disease. Slight  increase in small right pleural effusion. Trace left pleural fluid. No pulmonary edema. No pneumothorax. HEART AND MEDIASTINUM: Mild cardiomegaly. Right paratracheal mass has been detailed previously. BONES AND SOFT TISSUES: Skin fold over the superolateral right hemithorax. No acute osseous abnormality. IMPRESSION: 1. increase in small right and persistent small left pleural effusions, without pneumothorax. 2. Increased right and similar left base airspace disease, likely atelectasis. Electronically signed by: Rockey Kilts MD 09/17/2024 12:33 PM EST RP Workstation: [id number]   IR THORACENTESIS ASP PLEURAL SPACE W/IMG GUIDE Result Date: 09/14/2024 INDICATION: Patient with right upper lobe lung mass and recurrent right pleural effusion. Consult for diagnostic and therapeutic right thoracentesis. Her most recent thoracentesis was 09/06/24 (right, 1 liter output). EXAM: ULTRASOUND GUIDED RIGHT THORACENTESIS MEDICATIONS: 8 mL 1% lidocaine  with epi COMPLICATIONS: None immediate. PROCEDURE: An ultrasound guided thoracentesis was thoroughly discussed with the patient and questions answered. The benefits, risks, alternatives and complications were also discussed. The patient understands and wishes to proceed with the procedure. Written consent was obtained. Ultrasound was performed to localize and mark an adequate pocket of fluid in the right chest. The area was then prepped and draped in the normal sterile fashion. 1% Lidocaine  was used for local anesthesia. Under ultrasound guidance a 6 Fr Safe-T-Centesis catheter was introduced. Thoracentesis was performed. The catheter was removed and a dressing applied. FINDINGS: A total of approximately 950 milliliters of clear, yellow fluid was removed. Samples were sent to the laboratory as requested by the clinical team. IMPRESSION: Successful ultrasound guided right thoracentesis yielding 950 milliliters of pleural fluid. Performed by Laymon Coast, NP under the  supervision of Dr. Jennefer. Electronically Signed   By: Ester Jennefer M.D.   On: 09/14/2024 13:27   DG Chest 2 View Result Date: 09/12/2024 CLINICAL DATA:  Evaluate pleural effusion. EXAM: CHEST - 2 VIEW COMPARISON:  Chest x-ray 09/06/2024, chest CT 09/05/2024 FINDINGS: Lungs are adequately inflated demonstrate moderate size right pleural effusion with reaccumulation since the previous exam. Small left effusion. Likely associated bibasilar atelectatic change. Known right upper paramediastinal lung mass unchanged. Cardiomediastinal silhouette and remainder of the exam is unchanged. IMPRESSION: 1. Moderate size right pleural effusion with reaccumulation since the previous exam. Small left effusion. Likely associated bibasilar atelectatic change. 2. Known right upper paramediastinal lung mass unchanged. Electronically Signed   By: Toribio Agreste M.D.   On: 09/12/2024 11:52   IR THORACENTESIS ASP PLEURAL SPACE W/IMG GUIDE Result Date: 09/06/2024 INDICATION: Patient with right upper lobe lung mass, right pleural effusion. Consult for diagnostic and therapeutic right thoracentesis. EXAM: ULTRASOUND GUIDED DIAGNOSTIC AND THERAPEUTIC RIGHT THORACENTESIS MEDICATIONS: 8 mL 1% lidocaine  COMPLICATIONS: None immediate. PROCEDURE: An ultrasound guided thoracentesis was thoroughly discussed with the patient and questions answered. The benefits, risks, alternatives and complications were also discussed. The patient understands and wishes  to proceed with the procedure. Written consent was obtained. Ultrasound was performed to localize and mark an adequate pocket of fluid in the right chest. The area was then prepped and draped in the normal sterile fashion. 1% Lidocaine  was used for local anesthesia. Under ultrasound guidance a 6 Fr Safe-T-Centesis catheter was introduced. Thoracentesis was performed. The catheter was removed and a dressing applied. FINDINGS: A total of approximately 1 liter of slightly hazy yellow fluid was  removed. IMPRESSION: Successful ultrasound guided right thoracentesis yielding 1 liter of pleural fluid. Performed and dictated by Kimble Clas, PA-C Electronically Signed   By: CHRISTELLA.  Shick M.D.   On: 09/06/2024 15:33   DG Chest 1 View Result Date: 09/06/2024 EXAM: 1 VIEW XRAY OF THE CHEST 09/06/2024 02:50:00 PM COMPARISON: Comparison same date. CLINICAL HISTORY: [id number] Status post thoracentesis [id number]. Reason for exam: S/P right thoracentesis, 1 liter removed from the right side. FINDINGS: LUNGS AND PLEURA: No focal pulmonary opacity. No pulmonary edema. Right pleural effusion is significantly smaller. Status post right-sided thoracentesis. No definite pneumothorax. HEART AND MEDIASTINUM: No acute abnormality of the cardiac and mediastinal silhouettes. BONES AND SOFT TISSUES: No acute osseous abnormality. IMPRESSION: 1. No pneumothorax detected. 2. Marked interval decrease in right pleural effusion size. Electronically signed by: Lynwood Seip MD 09/06/2024 03:29 PM EDT RP Workstation: [id number]   CT Chest Wo Contrast Result Date: 09/06/2024 CLINICAL DATA:  Evaluate abnormal chest x-ray. EXAM: CT CHEST WITHOUT CONTRAST TECHNIQUE: Multidetector CT imaging of the chest was performed following the standard protocol without IV contrast. RADIATION DOSE REDUCTION: This exam was performed according to the departmental dose-optimization program which includes automated exposure control, adjustment of the mA and/or kV according to patient size and/or use of iterative reconstruction technique. COMPARISON:  Chest x-ray 08/25/2024 FINDINGS: Cardiovascular: The heart is normal in size. No pericardial effusion. The aorta is within normal limits in caliber. Scattered atherosclerotic calcifications. Scattered three-vessel coronary artery calcifications. Mediastinum/Nodes: Mediastinal and right hilar adenopathy. Right paratracheal/precarinal adenopathy with maximum measurement of 18 mm. Right hilar node measures 15 mm. There  is also an enlarged right supraclavicular node measuring 7 mm on image 19/2. Right axillary adenopathy measuring a maximum of 15 mm. The esophagus is grossly normal. Lungs/Pleura: There is a large medial right upper lobe lung mass. This measures approximately 4.5 x 3.5 x 3.0 cm. I do not see any definite metastatic pulmonary nodules. There is a large right pleural effusion which certainly could be malignant and thoracentesis may be held a diagnosis. Significant right middle lobe atelectasis without obvious endobronchial lesion. Lingular atelectasis is also noted. There is a small left pleural effusion. Upper Abdomen: No significant upper abdominal findings. No hepatic or adrenal gland lesions to suggest metastatic disease. No upper abdominal adenopathy. Aortic calcifications are noted. Musculoskeletal: No breast masses are identified. No bone lesions to suggest metastatic disease. IMPRESSION: 1. 4.5 x 3.5 x 3.0 cm medial right upper lobe lung mass consistent with primary lung neoplasm. Recommend PET-CT for further evaluation and staging and referral to multi disciplinary thoracic Oncology clinic. 2. Mediastinal, right hilar, right supraclavicular and right axillary adenopathy consistent with metastatic disease. 3. Large right pleural effusion which certainly could be malignant and thoracentesis may be yield a diagnosis. 4. Significant right middle lobe atelectasis without obvious endobronchial lesion. 5. Small left pleural effusion. 6. No findings for upper abdominal metastatic disease. 7. Aortic atherosclerosis. Aortic Atherosclerosis (ICD10-I70.0). Electronically Signed   By: MYRTIS Stammer M.D.   On: 09/06/2024 12:51   DG Chest 2  View Result Date: 09/06/2024 EXAM: 2 VIEW(S) XRAY OF THE CHEST 09/06/2024 10:51:00 AM COMPARISON: 08/25/2024 CLINICAL HISTORY: sob. Per triage notes: Pt c/o chest tightness and shortness of breath for awhile. Pt has labored breathing, able to speak a few words at a time, states the  pain feels like a tightness around her chest. Pt states tightness and shortness of breath have been ; getting worse.  FINDINGS: LUNGS AND PLEURA: Persistent right suprahilar mass. Patchy airspace opacities in left lung base. Increased small right pleural effusion. No pulmonary edema. No pneumothorax. HEART AND MEDIASTINUM: No acute abnormality of the cardiac and mediastinal silhouettes. BONES AND SOFT TISSUES: No acute osseous abnormality. IMPRESSION: 1. Persistent right suprahilar mass. 2. Increased small right pleural effusion. 3. Patchy airspace opacities in left lung base. Electronically signed by: Evalene Coho MD 09/06/2024 12:11 PM EDT RP Workstation: [id number]   EP STUDY Result Date: 08/30/2024 See surgical note for result.  DG Chest 2 View Result Date: 08/29/2024 CLINICAL DATA:  Dyspnea cough, congestion EXAM: CHEST - 2 VIEW COMPARISON:  10/22/2009 FINDINGS: Mild cardiomegaly. Masslike prominence of the right aspect of the upper mediastinum. Moderate right, small left pleural effusions and associated atelectasis or consolidation. IMPRESSION: 1. Masslike prominence of the right aspect of the upper mediastinum suspicious for mass or lymphadenopathy. Recommend CT to further evaluate. 2. Moderate right, small left pleural effusions and associated atelectasis or consolidation. 3. Mild cardiomegaly. Electronically Signed   By: Marolyn JONETTA Jaksch M.D.   On: 08/29/2024 18:45     Past medical hx Past Medical History:  Diagnosis Date   ABDOMINAL INCISIONAL HERNIA 08/13/2009   s/p repair   Allergic rhinitis, cause unspecified 09/28/2011   BRADYCARDIA 09/26/2010   CKD (chronic kidney disease), stage III (HCC) 02/26/2015   Solitary kidney   COLONIC POLYPS, HX OF 08/25/2007   Complication of anesthesia    DIVERTICULOSIS, COLON 08/25/2007   Dysrhythmia    GASTROESOPHAGEAL REFLUX DISEASE 08/04/2007   HYPERLIPIDEMIA 09/27/2010   not on Rx therapy   HYPERTENSION 08/06/2007   Hypothyroidism     INTERNAL HEMORRHOIDS 08/04/2007   LOW BACK PAIN 08/06/2007   scoliosis; DJD; gets injections   OSTEOPENIA 08/04/2007   PAF (paroxysmal atrial fibrillation) (HCC)    a.  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     Social History   Tobacco Use   Smoking status: Never    Passive exposure: Never   Smokeless tobacco: Never  Substance Use Topics   Alcohol use: No    Alcohol/week: 0.0 standard drinks of alcohol   Drug use: No    Ms.Russ reports that she has never smoked. She has never been exposed to tobacco smoke. She has never used smokeless tobacco. She reports that she does not drink alcohol and does not use drugs.  Tobacco Cessation: Counseling given: Not Answered Never smoker   Past surgical hx, Family hx, Social hx all reviewed.  Current Outpatient Medications on File Prior to Visit  Medication Sig   acetaminophen  (TYLENOL ) 500 MG tablet Take 500 mg by mouth every 4 (four) hours as needed for moderate pain (pain score 4-6) or mild pain (pain score 1-3).   amLODipine  (NORVASC ) 2.5 MG tablet TAKE 1 TABLET BY MOUTH IN THE MORNING AND AT BEDTIME   Ascorbic Acid (VITAMIN C) 1000 MG tablet  Take 1,000 mg by mouth daily.   furosemide  (LASIX ) 20 MG tablet Take 1 tablet (20 mg total) by mouth every other day.   levothyroxine  (SYNTHROID ) 50 MCG tablet TAKE 1 TABLET BY MOUTH ONCE DAILY BEFORE BREAKFAST APPOINTMENT  REQUIRED  FOR  FUTURE  REFILLS   LUMIGAN 0.01 % SOLN Place 1 drop into both eyes at bedtime.    metoprolol  tartrate (LOPRESSOR ) 25 MG tablet Take 3 tablets (75 mg total) by mouth daily.   Multiple Vitamins-Minerals (PRESERVISION AREDS 2 PO) Take 1 tablet by mouth daily.   Multiple Vitamins-Minerals (WOMENS 50+ MULTI VITAMIN/MIN PO) Take 1 tablet by mouth daily.   Omega-3 Fatty Acids (FISH OIL  TRIPLE STRENGTH) 1400 MG CAPS Take 1,400 mg by mouth daily.   pantoprazole  (PROTONIX ) 40 MG tablet Take 1 tablet (40 mg total) by mouth daily. TAKE 1 TABLET BY MOUTH ONCE DAILY   Perfluorohexyloctane (MIEBO) 1.338 GM/ML SOLN Place 1.338 mLs into both eyes 4 (four) times daily.   potassium chloride  (KLOR-CON  M) 10 MEQ tablet Take 1 tablet (10 mEq total) by mouth every other day.   potassium chloride  (KLOR-CON ) 10 MEQ tablet Take 10 mEq by mouth every other day.   [Paused] Rivaroxaban  (XARELTO ) 15 MG TABS tablet TAKE 1 TABLET BY MOUTH ONCE DAILY WITH SUPPER   vitamin k 100 MCG tablet Take 100 mcg by mouth daily.   No current facility-administered medications on file prior to visit.     Allergies  Allergen Reactions   Budesonide Other (See Comments)    Causes nasal bleeding   Elavil  [Amitriptyline ] Other (See Comments)    Feels bad   Gabapentin  Other (See Comments)    Loopy effect   Tramadol  Other (See Comments)    Loopy feeling   Hydrocodone Nausea Only and Other (See Comments)    lethargy   Ipratropium     Causes nasal bleeding    Review Of Systems:  Constitutional:   No  weight loss, night sweats,  Fevers, chills, fatigue, or  lassitude.  HEENT:   No headaches,  Difficulty swallowing,  Tooth/dental problems, or  Sore throat,                No sneezing, itching, ear ache, nasal congestion, post nasal drip,   CV:  + chest discomfort with movement,  No Orthopnea, PND, swelling in lower extremities, anasarca, dizziness, palpitations, syncope.   GI  No heartburn, indigestion, abdominal pain, nausea, vomiting, diarrhea, change in bowel habits, loss of appetite, bloody stools.   Resp: + shortness of breath with exertion not  at rest.  No excess mucus, no productive cough,  No non-productive cough,  No coughing up of blood.  No change in color of mucus.  No wheezing.  No chest wall deformity  Skin: no rash or lesions.  GU: no dysuria, change in color of urine, no urgency or  frequency.  No flank pain, no hematuria   MS:  No joint pain or swelling.  No decreased range of motion.  No back pain.  Psych:  No change in mood or affect. No depression or anxiety.  No memory loss.   Vital Signs BP 127/71   Pulse 71   Temp (!) 97.5 F (36.4 C) (Oral)   SpO2 95%     Physical Exam GENERAL: No distress, alert and oriented times 3. EARS NOSE THROAT: No sinus tenderness, tympanic membranes clear, pale nasal mucosa, no oral exudate, no post nasal drip, no lymphadenopathy. CHEST: No wheeze, rales, dullness,  no accessory muscle use, no nasal flaring, no sternal retractions.Slightly decreased per bases bilaterally, few crackles CARDIAC: S1, S2, regular rate and rhythm, no murmur. ABDOMINAL: Soft, non tender. ND, BS present, EXTREMITIES: No clubbing, cyanosis, edema. No obvious deformities NEUROLOGICAL: Normal strength. Alert and oriented x 3, MAE x 4 SKIN: No rashes, warm and dry. No obvious skin lesions PSYCHIATRIC: Normal mood and behavior.   Assessment/Plan  Assessment and Plan Assessment & Plan Malignant neoplasm of lung Awaiting PET scan results for further staging and treatment planning.  Plan  - Await PET scan results for further staging and treatment planning. - Consult with Dr. Deatrice for treatment options.  Pleural effusion, monitoring for reaccumulation Chest x-ray shows improvement in lung bases. Awaiting radiologist's definitive reading to confirm no reaccumulation of fluid. Plan Continue to monitor for re accumulation , dyspnea Seek emergency care for dypnea, or call the office to be seen.   I spent 20 minutes dedicated to the care of this patient on the date of this encounter to include pre-visit review of records, face-to-face time with the patient discussing conditions above, post visit ordering of testing, clinical documentation with the electronic health record, making appropriate referrals as documented, and communicating necessary  information to the patient's healthcare team.        Lauraine JULIANNA Lites, NP 09/20/2024  5:08 PM

## 2024-09-20 NOTE — Telephone Encounter (Signed)
 I called the patient's daughter and reviewed her CXR results from today, and the PET scan results. She verbalized understanding. She knows to call us  with any breathing issues, or to seek emergency care at the ED. Nothing further is needed.

## 2024-09-20 NOTE — Patient Instructions (Addendum)
 It is good to see you today. We have done a CXR to see if you have re-accumulated fluid in your lung. It looks good to my eye, but we will give you the definitive reading once the radiologist reads it.  I will call you once it has been officially read.  Follow up with Dr. Sherrod as is scheduled for 09/26/2024. Call if you need us  sooner.  Please contact office for sooner follow up if symptoms do not improve or worsen or seek emergency care .

## 2024-09-22 ENCOUNTER — Ambulatory Visit (HOSPITAL_COMMUNITY)
Admission: RE | Admit: 2024-09-22 | Discharge: 2024-09-22 | Disposition: A | Discharge status: Home / Self Care | Source: Ambulatory Visit | Attending: Internal Medicine | Admitting: Internal Medicine

## 2024-09-22 ENCOUNTER — Ambulatory Visit (HOSPITAL_COMMUNITY)

## 2024-09-22 DIAGNOSIS — I4819 Other persistent atrial fibrillation: Secondary | ICD-10-CM | POA: Diagnosis present

## 2024-09-22 LAB — ECHOCARDIOGRAM COMPLETE
Area-P 1/2: 4.16 cm2
P 1/2 time: 571 ms
S' Lateral: 2.9 cm

## 2024-09-24 ENCOUNTER — Ambulatory Visit: Payer: Self-pay | Admitting: Internal Medicine

## 2024-09-25 ENCOUNTER — Emergency Department (HOSPITAL_COMMUNITY)

## 2024-09-25 ENCOUNTER — Emergency Department (HOSPITAL_COMMUNITY)
Admission: EM | Admit: 2024-09-25 | Discharge: 2024-09-25 | Disposition: A | Discharge status: Home / Self Care | Attending: Emergency Medicine | Admitting: Emergency Medicine

## 2024-09-25 ENCOUNTER — Other Ambulatory Visit: Payer: Self-pay

## 2024-09-25 DIAGNOSIS — R600 Localized edema: Secondary | ICD-10-CM | POA: Insufficient documentation

## 2024-09-25 DIAGNOSIS — Z85118 Personal history of other malignant neoplasm of bronchus and lung: Secondary | ICD-10-CM | POA: Insufficient documentation

## 2024-09-25 DIAGNOSIS — C3490 Malignant neoplasm of unspecified part of unspecified bronchus or lung: Secondary | ICD-10-CM

## 2024-09-25 DIAGNOSIS — R0602 Shortness of breath: Secondary | ICD-10-CM | POA: Diagnosis present

## 2024-09-25 LAB — COMPREHENSIVE METABOLIC PANEL WITH GFR
ALT: 20 U/L (ref 0–44)
AST: 24 U/L (ref 15–41)
Albumin: 2.8 g/dL — ABNORMAL LOW (ref 3.5–5.0)
Alkaline Phosphatase: 115 U/L (ref 38–126)
Anion gap: 12 (ref 5–15)
BUN: 21 mg/dL (ref 8–23)
CO2: 19 mmol/L — ABNORMAL LOW (ref 22–32)
Calcium: 9.1 mg/dL (ref 8.9–10.3)
Chloride: 109 mmol/L (ref 98–111)
Creatinine, Ser: 1.65 mg/dL — ABNORMAL HIGH (ref 0.44–1.00)
GFR, Estimated: 30 mL/min — ABNORMAL LOW (ref >60)
Glucose, Bld: 99 mg/dL (ref 70–99)
Potassium: 4.4 mmol/L (ref 3.5–5.1)
Sodium: 140 mmol/L (ref 135–145)
Total Bilirubin: 0.7 mg/dL (ref 0.0–1.2)
Total Protein: 6.4 g/dL — ABNORMAL LOW (ref 6.5–8.1)

## 2024-09-25 LAB — CBC WITH DIFFERENTIAL/PLATELET
Abs Immature Granulocytes: 0.03 K/uL (ref 0.00–0.07)
Basophils Absolute: 0.1 K/uL (ref 0.0–0.1)
Basophils Relative: 1 %
Eosinophils Absolute: 0.1 K/uL (ref 0.0–0.5)
Eosinophils Relative: 1 %
HCT: 30.8 % — ABNORMAL LOW (ref 36.0–46.0)
Hemoglobin: 9.2 g/dL — ABNORMAL LOW (ref 12.0–15.0)
Immature Granulocytes: 0 %
Lymphocytes Relative: 8 %
Lymphs Abs: 0.6 K/uL — ABNORMAL LOW (ref 0.7–4.0)
MCH: 29.2 pg (ref 26.0–34.0)
MCHC: 29.9 g/dL — ABNORMAL LOW (ref 30.0–36.0)
MCV: 97.8 fL (ref 80.0–100.0)
Monocytes Absolute: 0.7 K/uL (ref 0.1–1.0)
Monocytes Relative: 8 %
Neutro Abs: 6.5 K/uL (ref 1.7–7.7)
Neutrophils Relative %: 82 %
Platelets: 418 K/uL — ABNORMAL HIGH (ref 150–400)
RBC: 3.15 MIL/uL — ABNORMAL LOW (ref 3.87–5.11)
RDW: 14.3 % (ref 11.5–15.5)
WBC: 8 K/uL (ref 4.0–10.5)
nRBC: 0 % (ref 0.0–0.2)

## 2024-09-25 LAB — BRAIN NATRIURETIC PEPTIDE: B Natriuretic Peptide: 614.2 pg/mL — ABNORMAL HIGH (ref 0.0–100.0)

## 2024-09-25 LAB — TROPONIN I (HIGH SENSITIVITY)
Troponin I (High Sensitivity): 18 ng/L — ABNORMAL HIGH (ref <18)
Troponin I (High Sensitivity): 19 ng/L — ABNORMAL HIGH (ref <18)

## 2024-09-25 MED ORDER — FUROSEMIDE 10 MG/ML IJ SOLN
40.0000 mg | Freq: Once | INTRAMUSCULAR | Status: AC
  Administered 2024-09-25: 40 mg via INTRAVENOUS
  Filled 2024-09-25: qty 4

## 2024-09-25 NOTE — ED Provider Triage Note (Signed)
 Emergency Medicine Provider Triage Evaluation Note  Carolyn Wells , a 83 y.o. female  was evaluated in triage.  Pt complains of chest pain and shortness of breath progressively worse x 3 days, acutely worse this AM. Hx lung cancer and pleural effusions requiring thoracentesis and afib taking xarelto  with no missed doses. Last seen on 11/9. Is meeting with oncology tomorrow. No current plan of treatment for lung cancer.   Endorses dry cough  Denies fever, headaches, blurry vision, vertigo,   Review of Systems  Positive: [name] Negative: [name]  Physical Exam  BP (!) 143/76   Pulse 78   Temp 97.7 F (36.5 C)   Resp 17   Ht 5' 6 (1.676 m)   Wt 63.5 kg   SpO2 95%   BMI 22.60 kg/m  Gen:   Awake, no distress   Resp:  Normal effort  MSK:   Moves extremities without difficulty  Other:    Medical Decision Making  Medically screening exam initiated at 11:52 AM.  Appropriate orders placed.  Carolyn Wells was informed that the remainder of the evaluation will be completed by another provider, this initial triage assessment does not replace that evaluation, and the importance of remaining in the ED until their evaluation is complete.     Carolyn Wells, [place] 09/25/24 1157

## 2024-09-25 NOTE — ED Notes (Signed)
 CCMD called.

## 2024-09-25 NOTE — Discharge Instructions (Signed)
 We are not sending you home with any new medications. Please continue taking your home medications as prescribed.   Please go to your oncology appointment tomorrow 11/18.   Please come back to the ED if you have an acute worsening of your SOB or if you start having chest pain.

## 2024-09-25 NOTE — ED Provider Notes (Signed)
 [place] EMERGENCY DEPARTMENT AT [facility] Provider Note   CSN: [id number] Arrival date & time: 09/25/24  1103     Patient presents with: Chest Pain, Shortness of Breath, and Weakness  Accompanied by daughter.  Was recently diagnosed with non-small cell lung cancer in the right upper lobe lung.  Has not seen the oncologist, has an appointment tomorrow.  Has been seen by pulmonology.  Has had 2 thoracenteses in the last couple of weeks.  Thoracenteses on 10/29 with malignant cells consistent with metastatic adenocarcinoma of the lung.  She had repeat right thoracenteses on 11/6.  Today she came in with similar symptoms of shortness of breath with standing and exertion.  Last seen in the ED on 11/9 for the same thing, x-ray at that time did not demonstrate significant right-sided pleural effusion.  She feels like it is a tight pressure under her bilateral breasts that she cannot get rid of.  The most comfortable that she can get is when she is sitting, she does feel worsening of symptoms when her legs are elevated.  Today she denies chest pain, denies shoulder pain, denies radiating pain to her neck and denies weakness of her arms or legs.  Carolyn Wells is a 83 y.o. female.    Chest Pain Associated symptoms: shortness of breath and weakness   Shortness of Breath Weakness Associated symptoms: shortness of breath        Prior to Admission medications   Medication Sig Start Date End Date Taking? Authorizing Provider  acetaminophen  (TYLENOL ) 500 MG tablet Take 500 mg by mouth every 4 (four) hours as needed for moderate pain (pain score 4-6) or mild pain (pain score 1-3).    [provider]  amLODipine  (NORVASC ) 2.5 MG tablet TAKE 1 TABLET BY MOUTH IN THE MORNING AND AT BEDTIME 09/04/24   Segal, Jared E, MD  Ascorbic Acid (VITAMIN C) 1000 MG tablet Take 1,000 mg by mouth daily.    [provider]  furosemide  (LASIX ) 20 MG tablet Take 1 tablet (20 mg  total) by mouth every other day. 09/14/24   Segal, Jared E, MD  levothyroxine  (SYNTHROID ) 50 MCG tablet TAKE 1 TABLET BY MOUTH ONCE DAILY BEFORE BREAKFAST APPOINTMENT  REQUIRED  FOR  FUTURE  REFILLS 09/04/24   [name], MD  LUMIGAN 0.01 % SOLN Place 1 drop into both eyes at bedtime.  08/08/15   [provider]  metoprolol  tartrate (LOPRESSOR ) 25 MG tablet Take 3 tablets (75 mg total) by mouth daily. 02/24/24   [name], MD  Multiple Vitamins-Minerals (PRESERVISION AREDS 2 PO) Take 1 tablet by mouth daily.    [provider]  Multiple Vitamins-Minerals (WOMENS 50+ MULTI VITAMIN/MIN PO) Take 1 tablet by mouth daily.    [provider]  Omega-3 Fatty Acids (FISH OIL TRIPLE STRENGTH) 1400 MG CAPS Take 1,400 mg by mouth daily.    [provider]  pantoprazole  (PROTONIX ) 40 MG tablet Take 1 tablet (40 mg total) by mouth daily. TAKE 1 TABLET BY MOUTH ONCE DAILY 05/08/24   [name], MD  Perfluorohexyloctane (MIEBO) 1.338 GM/ML SOLN Place 1.338 mLs into both eyes 4 (four) times daily.    [provider]  potassium chloride  (KLOR-CON  M) 10 MEQ tablet Take 1 tablet (10 mEq total) by mouth every other day. 09/14/24   Segal, Jared E, MD  potassium chloride  (KLOR-CON ) 10 MEQ tablet Take 10 mEq by mouth every other day. 09/14/24   [provider]  Rivaroxaban  (XARELTO ) 15 MG TABS tablet TAKE 1 TABLET BY MOUTH ONCE DAILY WITH SUPPER 05/08/24   Nahser, Aleene PARAS, MD  vitamin k 100 MCG tablet Take 100 mcg by mouth daily.    [provider]    Allergies: Budesonide, Elavil  [amitriptyline ], Gabapentin , Tramadol , Hydrocodone, and Ipratropium    Review of Systems  Respiratory:  Positive for shortness of breath.   Neurological:  Positive for weakness.    Updated Vital Signs BP (!) 143/76   Pulse 78   Temp 97.7 F (36.5 C)   Resp 17   Ht 5' 6 (1.676 m)   Wt 63.5 kg   SpO2 95%   BMI 22.60 kg/m   Physical Exam Constitutional:       General: She is not in acute distress.    Appearance: She is not ill-appearing.  Cardiovascular:     Rate and Rhythm: Rhythm irregular.  Pulmonary:     Effort: Pulmonary effort is normal. No respiratory distress.     Breath sounds: Examination of the right-middle field reveals rales. Examination of the right-lower field reveals rales. Examination of the left-lower field reveals rales. Rales present.  Chest:     Chest wall: No tenderness or edema.  Abdominal:     General: Bowel sounds are normal.     Palpations: Abdomen is soft. There is no splenomegaly or mass.     Tenderness: There is no abdominal tenderness. There is no guarding.  Musculoskeletal:     Right lower leg: Tenderness present. Edema present.     Left lower leg: Tenderness present. Edema present.     Comments: Bilateral pitting edema and tenderness to palpation  Skin:    Coloration: Skin is pale.  Neurological:     General: No focal deficit present.     Mental Status: She is alert and oriented to person, place, and time.  Psychiatric:        Mood and Affect: Mood normal.        Behavior: Behavior normal.     (all labs ordered are listed, but only abnormal results are displayed) Labs Reviewed  CBC WITH DIFFERENTIAL/PLATELET  COMPREHENSIVE METABOLIC PANEL WITH GFR  BRAIN NATRIURETIC PEPTIDE  TROPONIN I (HIGH SENSITIVITY)    EKG: None  Radiology: No results found.   Procedures   Medications Ordered in the ED - No data to display  Clinical Course as of 09/25/24 1538  Mon Sep 25, 2024  1507 Recent dx of metastatic lung cancer (within the last month), has had thoracentesis multiple times for fluid, shob. Came back with ongoing shob. Bilateral pitting edema today. Given 40mg  IV lasix . In Afib right now, taking xarelto  as prescribed. Chest Xray is stable today. Seeing oncology tomorrow. Planning on DC after lasix , repeat troponin.  [CP]    Clinical Course User Index [CP] Rosan Sherlean DEL, PA-C                                  Medical Decision Making  This patient presents to the ED for concern of shortness of breath in the setting of new lung cancer diagnosis and 2 recent thoracenteses, this involves an extensive number of treatment options, and is a complaint that carries with it a high risk of complications and morbidity.  The differential diagnosis includes pleural effusion, pneumothorax, heart failure, pneumonia, anxiety induced shortness of breath   Co morbidities / Chronic conditions that complicate the patient evaluation  Lung cancer, COPD, hypertension, A-fib with recent cardioversion, generalized anxiety disorder   Additional history obtained:  Additional history obtained from EMR External records from outside source obtained and reviewed including notes from her recent emergency department visit on the ninth as well as her appointment with pulmonology on the 12th detailing her lung cancer workup and diagnosis as well as her recurrent shortness of breath.   Lab Tests:  I Ordered, and personally interpreted labs.  The pertinent results include: BNP 614, platelets 418, creatinine 1.65, GFR 30, albumin 2.8, troponin 18   Imaging Studies ordered:  I ordered imaging studies including chest x-ray I independently visualized and interpreted imaging which showed stable pleural effusions and an unchanged right upper lobe mass with low lung volumes I agree with the radiologist interpretation   Cardiac Monitoring: / EKG:  The patient was maintained on a cardiac monitor.  I personally viewed and interpreted the cardiac monitored which showed an underlying rhythm of: Atrial fibrillation   Problem List / ED Course / Critical interventions / Medication management  Troponin 18, not concern for ACS.  She is anemic but this is consistent with previous labs and stable.  Chest x-ray consistent with stable bilateral pleural effusions primarily representing atelectasis and unchanged right  upper lobe mass and low lung volumes. She is saturating above 90% while talking and is not in respiratory distress.  She is comfortable.  She does have bilateral 2+ pitting edema, she takes Lasix  20 mg every other day.  She did take Lasix  today.  BNP in the 600s, will IV diuresis. I ordered medication including IV Lasix  40 mg and placed pure wick.  Patient has noticed she is peeing more, still saturating at above 95% and appears comfortable I have reviewed the patients home medicines and have made adjustments as needed   Consultations Obtained:     Social Determinants of Health:     Test / Admission - Considered:  3:38 PM Care of @PATIENTNAME @ transferred to [facility]  and Dr. Pamella at the end of my shift as the patient will require reassessment once labs/imaging have resulted. Patient presentation, ED course, and plan of care discussed with review of all pertinent labs and imaging. Please see his/her note for further details regarding further ED course and disposition. Plan at time of handoff is discharge to home. This may be altered or completely changed at the discretion of the oncoming team pending results of further workup.      Final diagnoses:  None    ED Discharge Orders     None          Charmayne Holmes, DO 09/25/24 1539    Garrick Charleston, MD 09/25/24 [phone]

## 2024-09-25 NOTE — ED Provider Notes (Signed)
 Accepted handoff at shift change from [facility], DO. Please see prior provider note for more detail.   Briefly: Patient is 83 y.o.   DDX: concern for Recent dx of metastatic lung cancer (within the last month), has had thoracentesis multiple times for fluid, shob. Came back with ongoing shob. Bilateral pitting edema today. Given 40mg  IV lasix . In Afib right now, taking xarelto  as prescribed. Chest Xray is stable today. Seeing oncology tomorrow. Planning on DC after lasix , repeat troponin.   Plan: Delta troponin is flat, patient with good diuresis after Lasix , will plan for close oncology follow-up in the morning as discussed.    Rosan Sherlean DEL, PA-C 09/25/24 1606    Pamella Ozell LABOR, DO 09/25/24 2332

## 2024-09-25 NOTE — ED Triage Notes (Addendum)
 Pt having chest pain, SOB, weakness. Diagnosed with lung cancer 2 weeks ago, had 2 thoracentesis in last 2 weeks with 1 Liter drained each time. This morning patient was more weak lethargic and SOB. Has hx of Afib and had to be cardioverted 1 month ago. Has appt with oncology tomorrow. Pt on xarelto  and Lasix , hasn't missed any doses.

## 2024-09-26 ENCOUNTER — Inpatient Hospital Stay

## 2024-09-26 ENCOUNTER — Inpatient Hospital Stay: Attending: Internal Medicine | Admitting: Internal Medicine

## 2024-09-26 VITALS — BP 135/81 | HR 84 | Temp 97.4°F | Resp 17 | Ht 66.0 in | Wt 140.0 lb

## 2024-09-26 DIAGNOSIS — J910 Malignant pleural effusion: Secondary | ICD-10-CM

## 2024-09-26 DIAGNOSIS — N183 Chronic kidney disease, stage 3 unspecified: Secondary | ICD-10-CM

## 2024-09-26 DIAGNOSIS — Z79899 Other long term (current) drug therapy: Secondary | ICD-10-CM

## 2024-09-26 DIAGNOSIS — C3411 Malignant neoplasm of upper lobe, right bronchus or lung: Secondary | ICD-10-CM

## 2024-09-26 DIAGNOSIS — I1310 Hypertensive heart and chronic kidney disease without heart failure, with stage 1 through stage 4 chronic kidney disease, or unspecified chronic kidney disease: Secondary | ICD-10-CM

## 2024-09-26 DIAGNOSIS — Z524 Kidney donor: Secondary | ICD-10-CM

## 2024-09-26 DIAGNOSIS — Z905 Acquired absence of kidney: Secondary | ICD-10-CM

## 2024-09-26 MED ORDER — ONDANSETRON HCL 8 MG PO TABS
8.0000 mg | ORAL_TABLET | Freq: Three times a day (TID) | ORAL | 1 refills | Status: AC | PRN
Start: 2024-09-26 — End: ?

## 2024-09-26 MED ORDER — PROCHLORPERAZINE MALEATE 10 MG PO TABS
10.0000 mg | ORAL_TABLET | Freq: Four times a day (QID) | ORAL | 1 refills | Status: AC | PRN
Start: 2024-09-26 — End: ?

## 2024-09-26 MED ORDER — LIDOCAINE-PRILOCAINE 2.5-2.5 % EX CREA: TOPICAL_CREAM | CUTANEOUS | 3 refills | Status: DC

## 2024-09-26 NOTE — Progress Notes (Signed)
 NN met pt today at her consult with Dr. Sherrod. Pt was accompanied by her husband, Deatrice, and her dtr, [name].  Plan for the pt is 4C of q3wk chemoimmunotherapy with carbo/taxol/keytruda.  NN explained the role of nurse navigator to the pt and provided Lung Cancer Journey book.  Pt is going to need a port. Information about the port and the procedure reviewed with pt and family.  No barriers identified during encounter. Pt has excellent support system and no financial or transportation concerns. NN provided pt with business card with direct contact information and encouraged pt and family to call with any concerns.  NN escorted pt and family to scheduler to arrange chemo education class. Class scheduled for 11/25.  NN escorted pt and family to [facility] nurse to assist dtr [name] with arranging her leave.

## 2024-09-26 NOTE — Progress Notes (Signed)
 [name] [phone]   [phone]  CONSULT NOTE  REFERRING PHYSICIAN: Lauraine Lites, NP  REASON FOR CONSULTATION:  83 years old white female recently diagnosed with lung cancer  HPI Carolyn Wells is a 83 y.o. female with past medical history significant for chronic kidney disease, allergic rhinitis, GERD, hypertension, dyslipidemia, hypothyroidism, osteopenia, paroxysmal atrial fibrillation and solitary kidney after right kidney donation to her husband in 2007.  The patient was seen by her primary care provider in October 2025 complaining of chest congestion, cough and shortness of breath.  She had chest x-ray on August 25, 2024 that showed masslike prominence of the right aspect of the upper mediastinum suspicious for a mass or lymphadenopathy.  There was also moderate right and small left lower lobe effusion with associated atelectasis or consolidation.  CT scan of the chest without contrast performed on September 05, 2024 showed a 4.5 x 3.5 x 3.0 cm medial right upper lobe lung mass consistent with primary lung neoplasm.  There was mediastinal, right hilar, right supraclavicular and right axillary adenopathy consistent with metastatic disease.  There was large right pleural effusion which is likely malignant.  There was also significant right middle lobe atelectasis without obvious endobronchial lesion and a small left pleural effusion.  On September 06, 2024 the patient underwent ultrasound-guided right thoracentesis with drainage of 1 L of pleural fluid.  The cytology showed malignant cells consistent with metastatic adenocarcinoma of lung primary.  She also had repeat ultrasound-guided right thoracentesis again on September 14, 2024 with drainage of 950 mL of pleural fluid and the final cytology was also consistent with adenocarcinoma of the lung.  A PET scan was performed on September 19, 2024 and that showed hypermetabolic right lobe mass with hypermetabolic  adenopathy extending to the left supraclavicular region compatible with T2b, N3, and 1A primary bronchogenic carcinoma.  There was also hypermetabolic nodular consolidation in the anterior left lower lobe that might be infectious/inflammatory.  The patient had malignant right pleural effusion.  MRI of the brain on September 19, 2024 showed 3 subcentimeter foci of restricted diffusion in the right frontal lobe 2 of these are punctate and the larger focus in the medial inferior frontal lobe measured up to 0.8 cm.  There was no edema or enhancement associated with these foci and these favored to represent recently small embolic strokes but nonenhancing metastases are also a possibility.  No evidence of any other brain metastasis.  And she has 1.8 cm meningioma at the frontoparietal vertex on the left. HPI  Discussed the use of AI scribe software for clinical note transcription with the patient, who gave verbal consent to proceed.  History of Present Illness Carolyn Wells is an 83 year old female with recently diagnosed lung cancer who presents for evaluation and management. She is accompanied by Deatrice, her partner, and her daughter [name].  She experiences a sensation of tightness around her chest, described as 'like your bra is too tight,' along with pain and difficulty breathing. These symptoms prompted a series of diagnostic tests, beginning with a chest x-ray on August 25, 2024, which revealed a mass in the right upper mediastinum. A subsequent CT scan on September 05, 2024, identified a 4.5 x 3.5 x 5.0 cm mass in the right upper lobe, with right hilar lymph node involvement, right mediastinal and right supraclavicular lymph node involvement, and a large right pleural effusion.  The pleural effusion was drained twice, initially removing one liter of fluid,  and a week later, 950 mL. Cytology confirmed adenocarcinoma of the lung, non-small cell lung cancer. A PET scan on September 19, 2024, confirmed the mass  and lymph node involvement, and an MRI of the brain showed possible old strokes but did not show evidence of cancer spread.  She experiences persistent shortness of breath and a sensation of weight on her chest. She has a dry cough but no hemoptysis. Her weight remains stable at around 140 pounds. No nausea, vomiting, diarrhea, or headaches.  Her medical history includes high blood pressure, high cholesterol, hypothyroidism, chronic kidney disease, paroxysmal atrial fibrillation, and low back pain. She donated a kidney to her father in 2007. She is unable to tolerate codeine or tramadol  due to side effects but has been using Xanax  occasionally for sleep, which has improved her rest over the past three days.  She smoked for ten years, quitting over fifty years ago, and does not consume alcohol or use street drugs.     Past Medical History:  Diagnosis Date   ABDOMINAL INCISIONAL HERNIA 08/13/2009   s/p repair   Allergic rhinitis, cause unspecified 09/28/2011   BRADYCARDIA 09/26/2010   CKD (chronic kidney disease), stage III (HCC) 02/26/2015   Solitary kidney   COLONIC POLYPS, HX OF 08/25/2007   Complication of anesthesia    DIVERTICULOSIS, COLON 08/25/2007   Dysrhythmia    GASTROESOPHAGEAL REFLUX DISEASE 08/04/2007   HYPERLIPIDEMIA 09/27/2010   not on Rx therapy   HYPERTENSION 08/06/2007   Hypothyroidism    INTERNAL HEMORRHOIDS 08/04/2007   LOW BACK PAIN 08/06/2007   scoliosis; DJD; gets injections   OSTEOPENIA 08/04/2007   PAF (paroxysmal atrial fibrillation) (HCC)    a.  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      Past Surgical History:  Procedure Laterality Date   CARDIOVERSION [name] 08/30/2024   Procedure: CARDIOVERSION;  Surgeon: Francyne Headland, MD;  Location: [facility];  Service:  Cardiovascular;  Laterality: [name];   IR THORACENTESIS ASP PLEURAL SPACE W/IMG GUIDE  09/06/2024   IR THORACENTESIS ASP PLEURAL SPACE W/IMG GUIDE  09/14/2024   LAMINECTOMY  1999   L4-5/lumbar   NEPHRECTOMY     Right   s/o incisional hernia repair     RUQ   STRABISMUS SURGERY Bilateral 04/14/2019   Procedure: STRABISMUS REPAIR BILATERAL;  Surgeon: Neysa Fallow, MD;  Location: [facility];  Service: Ophthalmology;  Laterality: Bilateral;   TONSILLECTOMY      Family History  Problem Relation Age of Onset   Coronary artery disease Mother 51       1st degree relative    Diabetes Mother    Heart attack Father 76   Diabetes Other        1st degree relative   Alzheimer's disease Brother     Social History Social History   Tobacco Use   Smoking status: Never    Passive exposure: Never   Smokeless tobacco: Never  Substance Use Topics   Alcohol use: No    Alcohol/week: 0.0 standard drinks of alcohol   Drug use: No    Allergies  Allergen Reactions   Budesonide Other (See Comments)    Causes nasal bleeding   Elavil  [Amitriptyline ] Other (See Comments)    Feels bad   Gabapentin  Other (See Comments)  Loopy effect   Tramadol  Other (See Comments)    Loopy feeling   Hydrocodone Nausea Only and Other (See Comments)    lethargy   Ipratropium     Causes nasal bleeding    Current Outpatient Medications  Medication Sig Dispense Refill   acetaminophen  (TYLENOL ) 500 MG tablet Take 500 mg by mouth every 4 (four) hours as needed for moderate pain (pain score 4-6) or mild pain (pain score 1-3).     amLODipine  (NORVASC ) 2.5 MG tablet TAKE 1 TABLET BY MOUTH IN THE MORNING AND AT BEDTIME 180 tablet 3   Ascorbic Acid (VITAMIN C) 1000 MG tablet Take 1,000 mg by mouth daily.     furosemide  (LASIX ) 20 MG tablet Take 1 tablet (20 mg total) by mouth every other day. 45 tablet 3   levothyroxine  (SYNTHROID ) 50 MCG tablet TAKE 1 TABLET BY MOUTH ONCE DAILY BEFORE BREAKFAST APPOINTMENT   REQUIRED  FOR  FUTURE  REFILLS 90 tablet 0   LUMIGAN 0.01 % SOLN Place 1 drop into both eyes at bedtime.      metoprolol  tartrate (LOPRESSOR ) 25 MG tablet Take 3 tablets (75 mg total) by mouth daily. 270 tablet 3   Multiple Vitamins-Minerals (PRESERVISION AREDS 2 PO) Take 1 tablet by mouth daily.     Multiple Vitamins-Minerals (WOMENS 50+ MULTI VITAMIN/MIN PO) Take 1 tablet by mouth daily.     Omega-3 Fatty Acids (FISH OIL TRIPLE STRENGTH) 1400 MG CAPS Take 1,400 mg by mouth daily.     pantoprazole  (PROTONIX ) 40 MG tablet Take 1 tablet (40 mg total) by mouth daily. TAKE 1 TABLET BY MOUTH ONCE DAILY 90 tablet 3   Perfluorohexyloctane (MIEBO) 1.338 GM/ML SOLN Place 1.338 mLs into both eyes 4 (four) times daily.     potassium chloride  (KLOR-CON  M) 10 MEQ tablet Take 1 tablet (10 mEq total) by mouth every other day. 45 tablet 3   potassium chloride  (KLOR-CON ) 10 MEQ tablet Take 10 mEq by mouth every other day.     [Paused] Rivaroxaban  (XARELTO ) 15 MG TABS tablet TAKE 1 TABLET BY MOUTH ONCE DAILY WITH SUPPER 90 tablet 1   vitamin k 100 MCG tablet Take 100 mcg by mouth daily.     No current facility-administered medications for this visit.    Review of Systems  Constitutional: positive for fatigue Eyes: negative Ears, nose, mouth, throat, and face: negative Respiratory: positive for cough and dyspnea on exertion Cardiovascular: negative Gastrointestinal: negative Genitourinary:negative Integument/breast: negative Hematologic/lymphatic: negative Musculoskeletal:negative Neurological: negative Behavioral/Psych: negative Endocrine: negative Allergic/Immunologic: negative  Physical Exam  MJO:jozmu, healthy, no distress, well nourished, and well developed SKIN: skin color, texture, turgor are normal, no rashes or significant lesions HEAD: Normocephalic, No masses, lesions, tenderness or abnormalities EYES: normal, PERRLA, Conjunctiva are pink and non-injected EARS: External ears normal,  Canals clear OROPHARYNX:no exudate, no erythema, and lips, buccal mucosa, and tongue normal  NECK: supple, no adenopathy, no JVD LYMPH:  no palpable lymphadenopathy, no hepatosplenomegaly BREAST:not examined LUNGS: coarse sounds heard, decreased breath sounds HEART: regular rate & rhythm, no murmurs, and no gallops ABDOMEN:abdomen soft, non-tender, normal bowel sounds, and no masses or organomegaly BACK: Back symmetric, no curvature., No CVA tenderness EXTREMITIES:no joint deformities, effusion, or inflammation, no edema  NEURO: alert & oriented x 3 with fluent speech, no focal motor/sensory deficits  PERFORMANCE STATUS: ECOG 1  LABORATORY DATA: Lab Results  Component Value Date   WBC 8.0 09/25/2024   HGB 9.2 (L) 09/25/2024   HCT 30.8 (L) 09/25/2024  MCV 97.8 09/25/2024   PLT 418 (H) 09/25/2024      Chemistry      Component Value Date/Time   NA 140 09/25/2024 1157   NA 141 02/02/2023 1406   K 4.4 09/25/2024 1157   CL 109 09/25/2024 1157   CO2 19 (L) 09/25/2024 1157   BUN 21 09/25/2024 1157   BUN 28 (H) 02/02/2023 1406   CREATININE 1.65 (H) 09/25/2024 1157   CREATININE 1.36 (H) 02/07/2016 0942      Component Value Date/Time   CALCIUM  9.1 09/25/2024 1157   ALKPHOS 115 09/25/2024 1157   AST 24 09/25/2024 1157   ALT 20 09/25/2024 1157   BILITOT 0.7 09/25/2024 1157       RADIOGRAPHIC STUDIES: DG Chest 2 View Result Date: 09/25/2024 EXAM: 2 VIEW(S) XRAY OF THE CHEST 09/25/2024 12:37:00 PM COMPARISON: Paracin colon 09/20/2024. CLINICAL HISTORY: chest pain and shortness of breath with Hx of pleural effusions and lung cancer FINDINGS: LUNGS AND PLEURA: Stable bilateral pleural effusions. Bibasilar airspace opacities primarily representing atelectasis; a component of pneumonia cannot be excluded. Right upper lobe paramediastinal mass noted unchanged. Low lung volumes are present, causing crowding of the pulmonary vasculature. No pneumothorax. HEART AND MEDIASTINUM: No acute  abnormality of the cardiac and mediastinal silhouettes. BONES AND SOFT TISSUES: No acute osseous abnormality. IMPRESSION: 1. Stable bilateral pleural effusions and bibasilar airspace opacities, primarily representing atelectasis. Pneumonia cannot be excluded. 2. Unchanged right upper lobe paramediastinal mass. 3. Low lung volumes causing crowding of the pulmonary vasculature. Electronically signed by: Ryan Salvage MD 09/25/2024 01:42 PM EST RP Workstation: [id number]   ECHOCARDIOGRAM COMPLETE Result Date: 09/22/2024    ECHOCARDIOGRAM REPORT   Patient Name:   JAZYAH BUTSCH Date of Exam: 09/22/2024 Medical Rec #:  [id number]         Height:       66.0 in Accession #:    [id number]        Weight:       143.6 lb Date of Birth:  1941/04/06         BSA:          1.737 m Patient Age:    83 years          BP:           132/80 mmHg Patient Gender: F                 HR:           80 bpm. Exam Location:  [address] Procedure: 2D Echo, Cardiac Doppler and Color Doppler (Both Spectral and Color            Flow Doppler were utilized during procedure). Indications:    I48.91 Atrial fibrillation  History:        Patient has prior history of Echocardiogram examinations, most                 recent 01/07/2023. Pleural effusion, Lung cancer,                 Arrythmias:Atrial Fibrillation, Signs/Symptoms:Shortness of                 Breath; Risk Factors:Hypertension and Dyslipidemia.  Sonographer:    Elsie Bohr RDCS Referring Phys: [id number] JARED E SEGAL IMPRESSIONS  1. Left ventricular ejection fraction, by estimation, is 55 to 60%. The left ventricle has normal function. The left ventricle has no regional wall motion abnormalities. There is mild concentric left ventricular hypertrophy. Left ventricular  diastolic function could not be evaluated.  2. Right ventricular systolic function is normal. The right ventricular size is normal.  3. Left atrial size was mildly dilated.  4. Right atrial size was mildly dilated.   5. Moderate pleural effusion in the left lateral region.  6. The mitral valve is normal in structure. Trivial mitral valve regurgitation. No evidence of mitral stenosis.  7. The aortic valve is tricuspid. Aortic valve regurgitation is mild. Aortic valve sclerosis is present, with no evidence of aortic valve stenosis.  8. The inferior vena cava is normal in size with greater than 50% respiratory variability, suggesting right atrial pressure of 3 mmHg. FINDINGS  Left Ventricle: Left ventricular ejection fraction, by estimation, is 55 to 60%. The left ventricle has normal function. The left ventricle has no regional wall motion abnormalities. The left ventricular internal cavity size was normal in size. There is  mild concentric left ventricular hypertrophy. Left ventricular diastolic function could not be evaluated due to atrial fibrillation. Left ventricular diastolic function could not be evaluated. Right Ventricle: The right ventricular size is normal. Right ventricular systolic function is normal. Left Atrium: Left atrial size was mildly dilated. Right Atrium: Right atrial size was mildly dilated. Pericardium: Trivial pericardial effusion is present. Mitral Valve: The mitral valve is normal in structure. Trivial mitral valve regurgitation. No evidence of mitral valve stenosis. Tricuspid Valve: The tricuspid valve is normal in structure. Tricuspid valve regurgitation is trivial. No evidence of tricuspid stenosis. Aortic Valve: The aortic valve is tricuspid. Aortic valve regurgitation is mild. Aortic regurgitation PHT measures 571 msec. Aortic valve sclerosis is present, with no evidence of aortic valve stenosis. Pulmonic Valve: The pulmonic valve was normal in structure. Pulmonic valve regurgitation is trivial. No evidence of pulmonic stenosis. Aorta: The aortic root is normal in size and structure. Venous: The inferior vena cava is normal in size with greater than 50% respiratory variability, suggesting right  atrial pressure of 3 mmHg. IAS/Shunts: No atrial level shunt detected by color flow Doppler. Additional Comments: There is a moderate pleural effusion in the left lateral region.  LEFT VENTRICLE PLAX 2D LVIDd:         4.30 cm LVIDs:         2.90 cm LV PW:         1.20 cm LV IVS:        1.20 cm LVOT diam:     1.90 cm LV SV:         48 LV SV Index:   28 LVOT Area:     2.84 cm  IVC IVC diam: 1.90 cm LEFT ATRIUM             Index        RIGHT ATRIUM           Index LA diam:        4.40 cm 2.53 cm/m   RA Area:     13.90 cm LA Vol (A2C):   46.9 ml 27.00 ml/m  RA Volume:   38.60 ml  22.22 ml/m LA Vol (A4C):   46.5 ml 26.77 ml/m LA Biplane Vol: 47.3 ml 27.23 ml/m  AORTIC VALVE LVOT Vmax:   84.60 cm/s LVOT Vmean:  54.720 cm/s LVOT VTI:    0.171 m AI PHT:      571 msec  AORTA Ao Root diam: 3.10 cm Ao Asc diam:  3.50 cm MITRAL VALVE  TRICUSPID VALVE MV Area (PHT): 4.16 cm     TR Peak grad:   32.9 mmHg MV Decel Time: 182 msec     TR Vmax:        287.00 cm/s MV E velocity: 114.40 cm/s                             SHUNTS                             Systemic VTI:  0.17 m                             Systemic Diam: 1.90 cm Redell Shallow MD Electronically signed by Redell Shallow MD Signature Date/Time: 09/22/2024/8:57:10 AM    Final    DG Chest 2 View Result Date: 09/20/2024 EXAM: 2 VIEW(S) XRAY OF THE CHEST 09/20/2024 10:58:24 AM COMPARISON: 09/17/2024 CLINICAL HISTORY: pleural effusion FINDINGS: LUNGS AND PLEURA: Similarly appearing, right upper lobe mass abutting the mediastinum. Small left pleural effusion. Streaky opacities in both lung bases, likely atelectasis. No pulmonary edema. No pneumothorax. HEART AND MEDIASTINUM: The right upper lobe mass abuts the mediastinum. No acute abnormality of the cardiac silhouette. BONES AND SOFT TISSUES: No acute osseous abnormality. IMPRESSION: 1. Streaky bibasilar airspace opacities, likely atelectasis. Trace left pleural effusion. 2. Right upper lobe mass  abutting the mediastinum, similar to prior exam. Electronically signed by: Rogelia Myers MD 09/20/2024 11:31 AM EST RP Workstation: [id number]   NM PET Image Initial (PI) Skull Base To Thigh Result Date: 09/20/2024 CLINICAL DATA:  Initial treatment strategy for non-small cell lung cancer. EXAM: NUCLEAR MEDICINE PET SKULL BASE TO THIGH TECHNIQUE: 7.1 mCi F-18 FDG was injected intravenously. Full-ring PET imaging was performed from the skull base to thigh after the radiotracer. CT data was obtained and used for attenuation correction and anatomic localization. Fasting blood glucose: 89 mg/dl COMPARISON:  CT chest 89/71/7974. FINDINGS: Mediastinal blood pool activity: SUV max 2.8 Liver activity: SUV max NA NECK: No abnormal hypermetabolism. Incidental CT findings: None. CHEST: Hypermetabolic supraclavicular, mediastinal, left hilar, right axillary, right internal mammary and prepericardiac lymph nodes. Index AP window lymph node measures 1.4 cm (4/68), SUV max 8.4. Medial right upper lobe mass has a long border of contact with the adjacent mediastinum and measures 3.0 x 4.4 cm with SUV max 12.1. Hypermetabolic nodular consolidation in the anterolateral left lower lobe measures approximately 10 mm (7/59), SUV max 3.1. No additional abnormal hypermetabolism. Incidental CT findings: Atherosclerotic calcification of the aorta, aortic valve and coronary arteries. Heart is enlarged. No pericardial effusion. Moderate right and small left pleural effusions. Volume loss in the lateral segment right middle lobe and inferior lingula. ABDOMEN/PELVIS: No abnormal hypermetabolism. Incidental CT findings: Right kidney is absent. Low-attenuation lesion in the left kidney. No specific follow-up necessary. SKELETON: No abnormal hypermetabolism. Incidental CT findings: Degenerative changes in the spine. IMPRESSION: 1. Hypermetabolic right upper lobe mass with hypermetabolic adenopathy extending to the left supraclavicular region,  findings compatible with at least T2bN3M0 or stage IIIB primary bronchogenic carcinoma. Hypermetabolic nodular consolidation in the anterior left lower lobe may be infectious/inflammatory in etiology. Metastatic disease or stage IVA disease cannot be definitively excluded. 2. Moderate right and small left pleural effusions. 3. Aortic atherosclerosis (ICD10-I70.0). Coronary artery calcification. Electronically Signed   By: Newell Eke M.D.   On: 09/20/2024 11:10  MR BRAIN W WO CONTRAST Result Date: 09/20/2024 CLINICAL DATA:  Non-small cell lung cancer.  Staging. EXAM: MRI HEAD WITHOUT AND WITH CONTRAST TECHNIQUE: Multiplanar, multiecho pulse sequences of the brain and surrounding structures were obtained without and with intravenous contrast. CONTRAST:  6.5mL GADAVIST GADOBUTROL 1 MMOL/ML IV SOLN COMPARISON:  Head CT 06/27/2023 FINDINGS: Brain: No focal finding affects the brainstem or cerebellum. Mild cerebellar volume loss, age related. Cerebral hemispheres show age related volume loss without subjective lobar predominance. There is a background pattern of mild chronic small-vessel ischemic change of the deep white matter. There are 3 subcentimeter foci of restricted diffusion in the right frontal lobe. Two of these are punctate and the larger focus in the medial inferior frontal lobe measures up to 8 mm. There is no edema or enhancement associated with these foci and they are favored to represent small embolic strokes. Nonenhancing metastases are possible but less likely. The clustered nature favors vascular pathology. No evidence of any other brain metastasis. There is an incidental meningioma measuring 1.8 cm in diameter at the frontoparietal vertex on the left. Minimal indentation of the surface of the brain but no significant mass effect. Broad surface along the superior sagittal sinus but without evidence of sinus invasion or occlusion. No hydrocephalus or extra-axial collection. Vascular: Major  vessels at the base of the brain show flow. Skull and upper cervical spine: Negative Sinuses/Orbits: Clear/normal Other: None IMPRESSION: 1. 3 subcentimeter foci of restricted diffusion in the right frontal lobe. Two of these are punctate and the larger focus in the medial inferior frontal lobe measures up to 8 mm. There is no edema or enhancement associated with these foci and they are favored to represent recent small embolic strokes. Nonenhancing metastases are possible but less likely. The clustered nature favors vascular pathology. 2. No evidence of any other brain metastasis. 3. 1.8 cm meningioma at the frontoparietal vertex on the left. Minimal indentation of the surface of the brain but no significant mass effect. Broad surface along the superior sagittal sinus but without evidence of sinus invasion or occlusion. No apparent change compared to the CT scan August 2024. Electronically Signed   By: Oneil Officer M.D.   On: 09/20/2024 11:00   DG Chest 1 View Result Date: 09/18/2024 CLINICAL DATA:  Status post right thoracentesis EXAM: CHEST  1 VIEW COMPARISON:  Chest x-ray performed September 06, 2024 FINDINGS: No pneumothorax. Improvement in aeration of the right lung with trace residual pleural fluid. Heart size is unchanged. Right upper lobe lesion is unchanged. Degenerative changes in the imaged osseous structures. IMPRESSION: 1. Improvement in aeration of the right lung without pneumothorax. 2. Stable appearance of right lung lesion. Electronically Signed   By: Maude Naegeli M.D.   On: 09/18/2024 07:42   DG Chest 2 View Result Date: 09/17/2024 EXAM: 2 VIEW(S) XRAY OF THE CHEST 09/17/2024 11:59:09 AM COMPARISON: 09/14/2024 CLINICAL HISTORY: sob, cp FINDINGS: LUNGS AND PLEURA: Lateral view degraded by patient arm position, not raised above the head. Hyperinflation. Increased right and persistent left base airspace disease. Slight increase in small right pleural effusion. Trace left pleural fluid. No  pulmonary edema. No pneumothorax. HEART AND MEDIASTINUM: Mild cardiomegaly. Right paratracheal mass has been detailed previously. BONES AND SOFT TISSUES: Skin fold over the superolateral right hemithorax. No acute osseous abnormality. IMPRESSION: 1. increase in small right and persistent small left pleural effusions, without pneumothorax. 2. Increased right and similar left base airspace disease, likely atelectasis. Electronically signed by: Rockey Kilts MD 09/17/2024 12:33  PM EST RP Workstation: [id number]   IR THORACENTESIS ASP PLEURAL SPACE W/IMG GUIDE Result Date: 09/14/2024 INDICATION: Patient with right upper lobe lung mass and recurrent right pleural effusion. Consult for diagnostic and therapeutic right thoracentesis. Her most recent thoracentesis was 09/06/24 (right, 1 liter output). EXAM: ULTRASOUND GUIDED RIGHT THORACENTESIS MEDICATIONS: 8 mL 1% lidocaine  with epi COMPLICATIONS: None immediate. PROCEDURE: An ultrasound guided thoracentesis was thoroughly discussed with the patient and questions answered. The benefits, risks, alternatives and complications were also discussed. The patient understands and wishes to proceed with the procedure. Written consent was obtained. Ultrasound was performed to localize and mark an adequate pocket of fluid in the right chest. The area was then prepped and draped in the normal sterile fashion. 1% Lidocaine  was used for local anesthesia. Under ultrasound guidance a 6 Fr Safe-T-Centesis catheter was introduced. Thoracentesis was performed. The catheter was removed and a dressing applied. FINDINGS: A total of approximately 950 milliliters of clear, yellow fluid was removed. Samples were sent to the laboratory as requested by the clinical team. IMPRESSION: Successful ultrasound guided right thoracentesis yielding 950 milliliters of pleural fluid. Performed by Laymon Coast, NP under the supervision of Dr. Jennefer. Electronically Signed   By: Ester Jennefer M.D.   On:  09/14/2024 13:27   DG Chest 2 View Result Date: 09/12/2024 CLINICAL DATA:  Evaluate pleural effusion. EXAM: CHEST - 2 VIEW COMPARISON:  Chest x-ray 09/06/2024, chest CT 09/05/2024 FINDINGS: Lungs are adequately inflated demonstrate moderate size right pleural effusion with reaccumulation since the previous exam. Small left effusion. Likely associated bibasilar atelectatic change. Known right upper paramediastinal lung mass unchanged. Cardiomediastinal silhouette and remainder of the exam is unchanged. IMPRESSION: 1. Moderate size right pleural effusion with reaccumulation since the previous exam. Small left effusion. Likely associated bibasilar atelectatic change. 2. Known right upper paramediastinal lung mass unchanged. Electronically Signed   By: Toribio Agreste M.D.   On: 09/12/2024 11:52   IR THORACENTESIS ASP PLEURAL SPACE W/IMG GUIDE Result Date: 09/06/2024 INDICATION: Patient with right upper lobe lung mass, right pleural effusion. Consult for diagnostic and therapeutic right thoracentesis. EXAM: ULTRASOUND GUIDED DIAGNOSTIC AND THERAPEUTIC RIGHT THORACENTESIS MEDICATIONS: 8 mL 1% lidocaine  COMPLICATIONS: None immediate. PROCEDURE: An ultrasound guided thoracentesis was thoroughly discussed with the patient and questions answered. The benefits, risks, alternatives and complications were also discussed. The patient understands and wishes to proceed with the procedure. Written consent was obtained. Ultrasound was performed to localize and mark an adequate pocket of fluid in the right chest. The area was then prepped and draped in the normal sterile fashion. 1% Lidocaine  was used for local anesthesia. Under ultrasound guidance a 6 Fr Safe-T-Centesis catheter was introduced. Thoracentesis was performed. The catheter was removed and a dressing applied. FINDINGS: A total of approximately 1 liter of slightly hazy yellow fluid was removed. IMPRESSION: Successful ultrasound guided right thoracentesis yielding 1  liter of pleural fluid. Performed and dictated by Kimble Clas, PA-C Electronically Signed   By: CHRISTELLA.  Shick M.D.   On: 09/06/2024 15:33   DG Chest 1 View Result Date: 09/06/2024 EXAM: 1 VIEW XRAY OF THE CHEST 09/06/2024 02:50:00 PM COMPARISON: Comparison same date. CLINICAL HISTORY: [id number] Status post thoracentesis [id number]. Reason for exam: S/P right thoracentesis, 1 liter removed from the right side. FINDINGS: LUNGS AND PLEURA: No focal pulmonary opacity. No pulmonary edema. Right pleural effusion is significantly smaller. Status post right-sided thoracentesis. No definite pneumothorax. HEART AND MEDIASTINUM: No acute abnormality of the cardiac and mediastinal silhouettes. BONES AND SOFT TISSUES:  No acute osseous abnormality. IMPRESSION: 1. No pneumothorax detected. 2. Marked interval decrease in right pleural effusion size. Electronically signed by: Lynwood Seip MD 09/06/2024 03:29 PM EDT RP Workstation: [id number]   CT Chest Wo Contrast Result Date: 09/06/2024 CLINICAL DATA:  Evaluate abnormal chest x-ray. EXAM: CT CHEST WITHOUT CONTRAST TECHNIQUE: Multidetector CT imaging of the chest was performed following the standard protocol without IV contrast. RADIATION DOSE REDUCTION: This exam was performed according to the departmental dose-optimization program which includes automated exposure control, adjustment of the mA and/or kV according to patient size and/or use of iterative reconstruction technique. COMPARISON:  Chest x-ray 08/25/2024 FINDINGS: Cardiovascular: The heart is normal in size. No pericardial effusion. The aorta is within normal limits in caliber. Scattered atherosclerotic calcifications. Scattered three-vessel coronary artery calcifications. Mediastinum/Nodes: Mediastinal and right hilar adenopathy. Right paratracheal/precarinal adenopathy with maximum measurement of 18 mm. Right hilar node measures 15 mm. There is also an enlarged right supraclavicular node measuring 7 mm on image 19/2.  Right axillary adenopathy measuring a maximum of 15 mm. The esophagus is grossly normal. Lungs/Pleura: There is a large medial right upper lobe lung mass. This measures approximately 4.5 x 3.5 x 3.0 cm. I do not see any definite metastatic pulmonary nodules. There is a large right pleural effusion which certainly could be malignant and thoracentesis may be held a diagnosis. Significant right middle lobe atelectasis without obvious endobronchial lesion. Lingular atelectasis is also noted. There is a small left pleural effusion. Upper Abdomen: No significant upper abdominal findings. No hepatic or adrenal gland lesions to suggest metastatic disease. No upper abdominal adenopathy. Aortic calcifications are noted. Musculoskeletal: No breast masses are identified. No bone lesions to suggest metastatic disease. IMPRESSION: 1. 4.5 x 3.5 x 3.0 cm medial right upper lobe lung mass consistent with primary lung neoplasm. Recommend PET-CT for further evaluation and staging and referral to multi disciplinary thoracic Oncology clinic. 2. Mediastinal, right hilar, right supraclavicular and right axillary adenopathy consistent with metastatic disease. 3. Large right pleural effusion which certainly could be malignant and thoracentesis may be yield a diagnosis. 4. Significant right middle lobe atelectasis without obvious endobronchial lesion. 5. Small left pleural effusion. 6. No findings for upper abdominal metastatic disease. 7. Aortic atherosclerosis. Aortic Atherosclerosis (ICD10-I70.0). Electronically Signed   By: MYRTIS Stammer M.D.   On: 09/06/2024 12:51   DG Chest 2 View Result Date: 09/06/2024 EXAM: 2 VIEW(S) XRAY OF THE CHEST 09/06/2024 10:51:00 AM COMPARISON: 08/25/2024 CLINICAL HISTORY: sob. Per triage notes: Pt c/o chest tightness and shortness of breath for awhile. Pt has labored breathing, able to speak a few words at a time, states the pain feels like a tightness around her chest. Pt states tightness and  shortness of breath have been ; getting worse.  FINDINGS: LUNGS AND PLEURA: Persistent right suprahilar mass. Patchy airspace opacities in left lung base. Increased small right pleural effusion. No pulmonary edema. No pneumothorax. HEART AND MEDIASTINUM: No acute abnormality of the cardiac and mediastinal silhouettes. BONES AND SOFT TISSUES: No acute osseous abnormality. IMPRESSION: 1. Persistent right suprahilar mass. 2. Increased small right pleural effusion. 3. Patchy airspace opacities in left lung base. Electronically signed by: Evalene Coho MD 09/06/2024 12:11 PM EDT RP Workstation: [id number]   EP STUDY Result Date: 08/30/2024 See surgical note for result.   ASSESSMENT: This is a very pleasant 83 years old white female with stage IV (T2b, N3, M1 a) non-small cell lung cancer, adenocarcinoma presented with right upper lobe lung mass in addition to right hilar,  mediastinal and right supraclavicular lymphadenopathy as well as malignant right pleural effusion diagnosed in October 2025 Molecular studies by foundation 1 showed positive KRAS G12C mutation and PD-L1 expression of 40%.   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  PLAN: I had a lengthy discussion with the patient and her family today about her current disease stage, prognosis and treatment options. I personally and independently reviewed the scan images as well as the pathology report and discussed the result with the patient and her family today. The patient understands that she has incurable condition and all the treatment will be of palliative care with the goal of prolongation of her survival and palliation of her symptoms.  Assessment and Plan Assessment & Plan Stage IV non-small cell lung cancer with malignant right pleural effusion Stage IV non-small cell lung cancer, adenocarcinoma type, with a 4.5 x 3.5 x 5.0 cm mass in the right upper lobe, right hilar,  mediastinal, and supraclavicular lymph node involvement, and malignant right pleural effusion. PET scan confirmed the mass and lymph node involvement. MRI brain showed no metastasis. KRAS G12C mutation present, and PD-L1 expression is 40%. Surgery and radiation are not viable options due to stage IV status and lack of specific targets. Treatment plan involves chemotherapy and immunotherapy. Paclitaxel is chosen over pemetrexed due to renal function. Hair loss and neuropathy are potential side effects of paclitaxel. Treatment aims to prolong life and improve quality of life, not cure. Palliative care is an option if treatment is declined. - Will administer carboplatin and paclitaxel for four cycles every three weeks.  She is not a candidate for treatment with pemetrexed because of the renal insufficiency. - Will transition to Keytruda monotherapy after four cycles if effective. - Will prepare for port placement for chemotherapy administration. - Will arrange for pleural effusion drainage before Thanksgiving. - Will provide chemotherapy class education. - Will prescribe Xanax  for sleep, with caution against dependency. - Will refer to palliative care for pain management and Xanax  management.  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 Renal function influences chemotherapy choice, with paclitaxel preferred over pemetrexed due to renal status. The patient was advised to call immediately if she has any concerning symptoms in the interval.  The patient voices understanding of current disease status and treatment options and is in agreement with the current care plan.  All questions were answered. The patient knows to call the clinic with any problems, questions or concerns. We can certainly see the patient much sooner if necessary.  Thank you so much for allowing me to participate in the care of Lennart [name]. I will  continue to follow up the patient with you and assist in her care. The total time spent in the appointment was 90 minutes including review of chart and various tests results, discussions about plan of care and coordination of care plan .   Disclaimer: This note was dictated with voice recognition software. Similar sounding words can inadvertently be transcribed and may not be corrected upon review.   Sherrod MARLA Sherrod September 26, 2024, 1:55 PM

## 2024-09-26 NOTE — Progress Notes (Signed)
 START OFF PATHWAY REGIMEN - Non-Small Cell Lung   [id number]:Carboplatin AUC=6 IV D1 + Paclitaxel 200 mg/m2 IV D1 + Pembrolizumab 200 mg IV D1 q21 Days x 4 Cycles:   A cycle is every 21 days:     Pembrolizumab      Paclitaxel      Carboplatin   **Always confirm dose/schedule in your pharmacy ordering system**  Patient Characteristics: Stage IV Metastatic, Nonsquamous, Molecular Analysis Completed, Molecular Alteration Present and Targeted Therapy Exhausted, OR EGFR/KRAS/HER2/NRG1/c-Met Present and Standard Chemotherapy/Immunotherapy Planned, OR No Alteration Present, Initial  Chemotherapy/Immunotherapy, PS = 0, 1, BRAF/MET/KRAS/HER2 Mutation Positive or c-Met High Overexpression Positive (EGFR Wildtype), Candidate for Immunotherapy, PD-L1 Expression Positive 1-49% (TPS) / Negative / Not Tested / Awaiting Test Results and  Immunotherapy Candidate Therapeutic Status: Stage IV Metastatic Histology: Nonsquamous Cell Broad Molecular Profiling Status: Molecular Analysis Completed Molecular Analysis Results: KRAS G12C Mutation Present and No Prior Chemo/Immunotherapy ECOG Performance Status: 1 Chemotherapy/Immunotherapy Line of Therapy: Initial Chemotherapy/Immunotherapy Immunotherapy Candidate Status: Candidate for Immunotherapy PD-L1 Expression Status: PD-L1 Positive 1-49% (TPS) Intent of Therapy: Non-Curative / Palliative Intent, Discussed with Patient

## 2024-09-27 ENCOUNTER — Encounter (HOSPITAL_COMMUNITY): Payer: Self-pay

## 2024-09-27 ENCOUNTER — Other Ambulatory Visit: Payer: Self-pay

## 2024-09-27 ENCOUNTER — Encounter: Payer: Self-pay | Admitting: Internal Medicine

## 2024-09-27 DIAGNOSIS — C3411 Malignant neoplasm of upper lobe, right bronchus or lung: Secondary | ICD-10-CM

## 2024-09-28 ENCOUNTER — Encounter (HOSPITAL_COMMUNITY): Payer: Self-pay

## 2024-09-28 ENCOUNTER — Other Ambulatory Visit: Payer: Self-pay

## 2024-09-28 ENCOUNTER — Ambulatory Visit (HOSPITAL_COMMUNITY)
Admission: RE | Admit: 2024-09-28 | Discharge: 2024-09-28 | Disposition: A | Source: Ambulatory Visit | Attending: Radiology

## 2024-09-28 ENCOUNTER — Ambulatory Visit (HOSPITAL_COMMUNITY)
Admission: RE | Admit: 2024-09-28 | Discharge: 2024-09-28 | Disposition: A | Discharge status: Home / Self Care | Source: Ambulatory Visit | Attending: Internal Medicine | Admitting: Internal Medicine

## 2024-09-28 DIAGNOSIS — J910 Malignant pleural effusion: Secondary | ICD-10-CM | POA: Diagnosis not present

## 2024-09-28 DIAGNOSIS — C3411 Malignant neoplasm of upper lobe, right bronchus or lung: Secondary | ICD-10-CM | POA: Diagnosis present

## 2024-09-28 HISTORY — PX: IR THORACENTESIS ASP PLEURAL SPACE W/IMG GUIDE: IMG5380

## 2024-09-28 MED ORDER — LIDOCAINE-EPINEPHRINE 1 %-1:100000 IJ SOLN
20.0000 mL | Freq: Once | INTRAMUSCULAR | Status: AC
  Administered 2024-09-28: 7 mL via INTRADERMAL

## 2024-09-28 MED ORDER — LIDOCAINE-EPINEPHRINE 1 %-1:100000 IJ SOLN
INTRAMUSCULAR | Status: AC
  Filled 2024-09-28: qty 1

## 2024-09-28 NOTE — Procedures (Signed)
 PROCEDURE SUMMARY:  Successful image-guided right thoracentesis. Yielded 750 mL of dark yellow fluid. Patient tolerated procedure well. EBL: trace No immediate complications.  Specimen not sent for labs. Post procedure CXR ordered  Please see imaging section of Epic for full dictation.  Kimble DEL Altair Appenzeller PA-C 09/28/2024 1:32 PM

## 2024-09-29 ENCOUNTER — Other Ambulatory Visit: Payer: Self-pay

## 2024-09-29 ENCOUNTER — Other Ambulatory Visit: Payer: Self-pay | Admitting: Physician Assistant

## 2024-09-29 DIAGNOSIS — C3411 Malignant neoplasm of upper lobe, right bronchus or lung: Secondary | ICD-10-CM

## 2024-10-01 ENCOUNTER — Other Ambulatory Visit: Payer: Self-pay

## 2024-10-02 ENCOUNTER — Emergency Department (HOSPITAL_COMMUNITY)

## 2024-10-02 ENCOUNTER — Telehealth: Payer: Self-pay

## 2024-10-02 ENCOUNTER — Ambulatory Visit (INDEPENDENT_AMBULATORY_CARE_PROVIDER_SITE_OTHER): Admitting: Internal Medicine

## 2024-10-02 ENCOUNTER — Other Ambulatory Visit: Payer: Self-pay

## 2024-10-02 ENCOUNTER — Encounter: Payer: Self-pay | Admitting: Internal Medicine

## 2024-10-02 ENCOUNTER — Inpatient Hospital Stay (HOSPITAL_COMMUNITY)
Admission: EM | Admit: 2024-10-02 | Discharge: 2024-10-07 | DRG: 180 | Disposition: A | Discharge status: Home / Self Care | Attending: Internal Medicine | Admitting: Internal Medicine

## 2024-10-02 VITALS — BP 110/64 | HR 73 | Ht 66.0 in | Wt 146.4 lb

## 2024-10-02 DIAGNOSIS — C3411 Malignant neoplasm of upper lobe, right bronchus or lung: Secondary | ICD-10-CM

## 2024-10-02 DIAGNOSIS — E039 Hypothyroidism, unspecified: Secondary | ICD-10-CM | POA: Diagnosis present

## 2024-10-02 DIAGNOSIS — I4891 Unspecified atrial fibrillation: Secondary | ICD-10-CM

## 2024-10-02 DIAGNOSIS — I5033 Acute on chronic diastolic (congestive) heart failure: Secondary | ICD-10-CM

## 2024-10-02 DIAGNOSIS — Z885 Allergy status to narcotic agent status: Secondary | ICD-10-CM

## 2024-10-02 DIAGNOSIS — J90 Pleural effusion, not elsewhere classified: Secondary | ICD-10-CM

## 2024-10-02 DIAGNOSIS — I509 Heart failure, unspecified: Secondary | ICD-10-CM

## 2024-10-02 DIAGNOSIS — K219 Gastro-esophageal reflux disease without esophagitis: Secondary | ICD-10-CM | POA: Diagnosis present

## 2024-10-02 DIAGNOSIS — J910 Malignant pleural effusion: Secondary | ICD-10-CM | POA: Diagnosis present

## 2024-10-02 DIAGNOSIS — Z905 Acquired absence of kidney: Secondary | ICD-10-CM

## 2024-10-02 DIAGNOSIS — Z79890 Hormone replacement therapy (postmenopausal): Secondary | ICD-10-CM

## 2024-10-02 DIAGNOSIS — Z8249 Family history of ischemic heart disease and other diseases of the circulatory system: Secondary | ICD-10-CM

## 2024-10-02 DIAGNOSIS — J449 Chronic obstructive pulmonary disease, unspecified: Secondary | ICD-10-CM | POA: Diagnosis present

## 2024-10-02 DIAGNOSIS — E785 Hyperlipidemia, unspecified: Secondary | ICD-10-CM | POA: Diagnosis present

## 2024-10-02 DIAGNOSIS — I130 Hypertensive heart and chronic kidney disease with heart failure and stage 1 through stage 4 chronic kidney disease, or unspecified chronic kidney disease: Secondary | ICD-10-CM | POA: Diagnosis present

## 2024-10-02 DIAGNOSIS — C3491 Malignant neoplasm of unspecified part of right bronchus or lung: Principal | ICD-10-CM | POA: Diagnosis present

## 2024-10-02 DIAGNOSIS — R0602 Shortness of breath: Secondary | ICD-10-CM

## 2024-10-02 DIAGNOSIS — I4819 Other persistent atrial fibrillation: Secondary | ICD-10-CM | POA: Diagnosis not present

## 2024-10-02 DIAGNOSIS — Z833 Family history of diabetes mellitus: Secondary | ICD-10-CM

## 2024-10-02 DIAGNOSIS — Z79899 Other long term (current) drug therapy: Secondary | ICD-10-CM

## 2024-10-02 DIAGNOSIS — Z66 Do not resuscitate: Secondary | ICD-10-CM | POA: Diagnosis present

## 2024-10-02 DIAGNOSIS — I5043 Acute on chronic combined systolic (congestive) and diastolic (congestive) heart failure: Principal | ICD-10-CM

## 2024-10-02 DIAGNOSIS — J309 Allergic rhinitis, unspecified: Secondary | ICD-10-CM | POA: Diagnosis present

## 2024-10-02 DIAGNOSIS — Z888 Allergy status to other drugs, medicaments and biological substances status: Secondary | ICD-10-CM

## 2024-10-02 DIAGNOSIS — Z7901 Long term (current) use of anticoagulants: Secondary | ICD-10-CM

## 2024-10-02 LAB — COMPREHENSIVE METABOLIC PANEL WITH GFR
ALT: 23 U/L (ref 0–44)
AST: 26 U/L (ref 15–41)
Albumin: 2.9 g/dL — ABNORMAL LOW (ref 3.5–5.0)
Alkaline Phosphatase: 119 U/L (ref 38–126)
Anion gap: 18 — ABNORMAL HIGH (ref 5–15)
BUN: 18 mg/dL (ref 8–23)
CO2: 20 mmol/L — ABNORMAL LOW (ref 22–32)
Calcium: 9.3 mg/dL (ref 8.9–10.3)
Chloride: 106 mmol/L (ref 98–111)
Creatinine, Ser: 1.7 mg/dL — ABNORMAL HIGH (ref 0.44–1.00)
GFR, Estimated: 29 mL/min — ABNORMAL LOW (ref >60)
Glucose, Bld: 101 mg/dL — ABNORMAL HIGH (ref 70–99)
Potassium: 4.7 mmol/L (ref 3.5–5.1)
Sodium: 144 mmol/L (ref 135–145)
Total Bilirubin: 0.7 mg/dL (ref 0.0–1.2)
Total Protein: 6.2 g/dL — ABNORMAL LOW (ref 6.5–8.1)

## 2024-10-02 LAB — CBC WITH DIFFERENTIAL/PLATELET
Abs Immature Granulocytes: 0.03 K/uL (ref 0.00–0.07)
Basophils Absolute: 0 K/uL (ref 0.0–0.1)
Basophils Relative: 1 %
Eosinophils Absolute: 0.1 K/uL (ref 0.0–0.5)
Eosinophils Relative: 1 %
HCT: 28.5 % — ABNORMAL LOW (ref 36.0–46.0)
Hemoglobin: 8.6 g/dL — ABNORMAL LOW (ref 12.0–15.0)
Immature Granulocytes: 0 %
Lymphocytes Relative: 10 %
Lymphs Abs: 0.7 K/uL (ref 0.7–4.0)
MCH: 29 pg (ref 26.0–34.0)
MCHC: 30.2 g/dL (ref 30.0–36.0)
MCV: 96 fL (ref 80.0–100.0)
Monocytes Absolute: 0.6 K/uL (ref 0.1–1.0)
Monocytes Relative: 9 %
Neutro Abs: 5.5 K/uL (ref 1.7–7.7)
Neutrophils Relative %: 79 %
Platelets: 356 K/uL (ref 150–400)
RBC: 2.97 MIL/uL — ABNORMAL LOW (ref 3.87–5.11)
RDW: 14.4 % (ref 11.5–15.5)
WBC: 6.9 K/uL (ref 4.0–10.5)
nRBC: 0 % (ref 0.0–0.2)

## 2024-10-02 LAB — I-STAT CHEM 8, ED
BUN: 18 mg/dL (ref 8–23)
Calcium, Ion: 1.12 mmol/L — ABNORMAL LOW (ref 1.15–1.40)
Chloride: 110 mmol/L (ref 98–111)
Creatinine, Ser: 1.8 mg/dL — ABNORMAL HIGH (ref 0.44–1.00)
Glucose, Bld: 101 mg/dL — ABNORMAL HIGH (ref 70–99)
HCT: 26 % — ABNORMAL LOW (ref 36.0–46.0)
Hemoglobin: 8.8 g/dL — ABNORMAL LOW (ref 12.0–15.0)
Potassium: 4.7 mmol/L (ref 3.5–5.1)
Sodium: 141 mmol/L (ref 135–145)
TCO2: 22 mmol/L (ref 22–32)

## 2024-10-02 LAB — MAGNESIUM: Magnesium: 1.8 mg/dL (ref 1.7–2.4)

## 2024-10-02 LAB — TROPONIN I (HIGH SENSITIVITY)
Troponin I (High Sensitivity): 22 ng/L — ABNORMAL HIGH (ref <18)
Troponin I (High Sensitivity): 16 ng/L (ref <18)

## 2024-10-02 LAB — BRAIN NATRIURETIC PEPTIDE: B Natriuretic Peptide: 667.9 pg/mL — ABNORMAL HIGH (ref 0.0–100.0)

## 2024-10-02 MED ORDER — METOPROLOL TARTRATE 50 MG PO TABS
75.0000 mg | ORAL_TABLET | Freq: Every day | ORAL | Status: DC
  Administered 2024-10-02 – 2024-10-04 (×3): 75 mg via ORAL
  Filled 2024-10-02 (×2): qty 3
  Filled 2024-10-02: qty 1

## 2024-10-02 MED ORDER — ACETAMINOPHEN 650 MG RE SUPP: 650.0000 mg | Freq: Four times a day (QID) | RECTAL | Status: DC | PRN

## 2024-10-02 MED ORDER — ONDANSETRON HCL 4 MG PO TABS: 4.0000 mg | ORAL_TABLET | Freq: Four times a day (QID) | ORAL | Status: DC | PRN

## 2024-10-02 MED ORDER — ENOXAPARIN SODIUM 40 MG/0.4ML IJ SOSY: 40.0000 mg | PREFILLED_SYRINGE | INTRAMUSCULAR | Status: DC

## 2024-10-02 MED ORDER — AMLODIPINE BESYLATE 5 MG PO TABS
2.5000 mg | ORAL_TABLET | Freq: Every day | ORAL | Status: DC
  Administered 2024-10-02 – 2024-10-03 (×2): 2.5 mg via ORAL
  Filled 2024-10-02 (×2): qty 1

## 2024-10-02 MED ORDER — POTASSIUM CHLORIDE CRYS ER 20 MEQ PO TBCR
20.0000 meq | EXTENDED_RELEASE_TABLET | Freq: Once | ORAL | Status: AC
  Administered 2024-10-02: 20 meq via ORAL
  Filled 2024-10-02: qty 1

## 2024-10-02 MED ORDER — RIVAROXABAN 15 MG PO TABS: 15.0000 mg | ORAL_TABLET | Freq: Every day | ORAL | Status: DC

## 2024-10-02 MED ORDER — ONDANSETRON HCL 4 MG/2ML IJ SOLN
4.0000 mg | Freq: Four times a day (QID) | INTRAMUSCULAR | Status: DC | PRN
  Administered 2024-10-05: 4 mg via INTRAVENOUS
  Filled 2024-10-02: qty 2

## 2024-10-02 MED ORDER — FUROSEMIDE 10 MG/ML IJ SOLN
40.0000 mg | Freq: Every day | INTRAMUSCULAR | Status: DC
  Administered 2024-10-03: 40 mg via INTRAVENOUS
  Filled 2024-10-02: qty 4

## 2024-10-02 MED ORDER — SODIUM CHLORIDE 0.9% FLUSH
3.0000 mL | Freq: Two times a day (BID) | INTRAVENOUS | Status: DC
  Administered 2024-10-03 – 2024-10-07 (×8): 3 mL via INTRAVENOUS

## 2024-10-02 MED ORDER — OXYCODONE HCL 5 MG PO TABS
5.0000 mg | ORAL_TABLET | ORAL | Status: DC | PRN
  Administered 2024-10-02 – 2024-10-04 (×2): 5 mg via ORAL
  Filled 2024-10-02 (×2): qty 1

## 2024-10-02 MED ORDER — LEVOTHYROXINE SODIUM 50 MCG PO TABS
50.0000 ug | ORAL_TABLET | Freq: Every day | ORAL | Status: DC
  Administered 2024-10-03 – 2024-10-07 (×5): 50 ug via ORAL
  Filled 2024-10-02 (×5): qty 1

## 2024-10-02 MED ORDER — BISACODYL 5 MG PO TBEC: 5.0000 mg | DELAYED_RELEASE_TABLET | Freq: Every day | ORAL | Status: DC | PRN

## 2024-10-02 MED ORDER — MORPHINE SULFATE (PF) 2 MG/ML IV SOLN
1.0000 mg | INTRAVENOUS | Status: DC | PRN
  Administered 2024-10-04 – 2024-10-05 (×4): 1 mg via INTRAVENOUS
  Filled 2024-10-02 (×5): qty 1

## 2024-10-02 MED ORDER — ACETAMINOPHEN 325 MG PO TABS
650.0000 mg | ORAL_TABLET | Freq: Four times a day (QID) | ORAL | Status: DC | PRN
Start: 2024-10-02 — End: 2024-10-07

## 2024-10-02 MED ORDER — FUROSEMIDE 10 MG/ML IJ SOLN
40.0000 mg | Freq: Once | INTRAMUSCULAR | Status: AC
  Administered 2024-10-02: 40 mg via INTRAVENOUS
  Filled 2024-10-02: qty 4

## 2024-10-02 MED ORDER — PANTOPRAZOLE SODIUM 40 MG PO TBEC
40.0000 mg | DELAYED_RELEASE_TABLET | Freq: Every day | ORAL | Status: DC
  Administered 2024-10-03 – 2024-10-07 (×5): 40 mg via ORAL
  Filled 2024-10-02 (×5): qty 1

## 2024-10-02 NOTE — ED Provider Notes (Cosign Needed Addendum)
 [place] [facility]GENCY DEPARTMENT AT [facility] Provider Note   CSN: [id number] Arrival date & time: 10/02/24  1150     Patient presents with: Shortness of Breath   Carolyn Wells is a 83 y.o. female who presents to the ED today via EMS from heart failure clinic for referral for CHF exacerbation.  She was presenting for regular evaluation, had been having worsening shortness of breath over the last several days and also noted increased lower extremity edema over the same timeframe, currently a regimen for diuresis is 20 mg once every other day.  Review of most recent echocardiogram was was performed very recently on 22 September 2024 did demonstrate a LVEF of 55 to 60%.  Known history of pleural effusion and lung cancer.  Further medical diagnoses noted with atrial fibrillation, hypothyroidism, GERD.  On review of the note from her cardiologist today, noted that she had irregular rhythm with significant orthopnea at 45 degrees.  Noted decreased breath sounds in the bilateral lower lung fields.  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  It is noted that they have plans to put her on palliative chemotherapy.  Primary concern for referral was dyspnea secondary to chronic diastolic heart failure.    Shortness of Breath      Prior to Admission medications   Medication Sig Start Date End Date Taking? Authorizing Provider  acetaminophen  (TYLENOL ) 500 MG tablet Take 500 mg by mouth daily as needed for moderate pain (pain score 4-6) or mild pain (pain score 1-3).   Yes [provider]  ALPRAZolam  (XANAX ) 1 MG tablet Take 1 mg by mouth at bedtime.   Yes [provider]  amLODipine  (NORVASC ) 2.5 MG tablet TAKE 1 TABLET BY MOUTH IN THE MORNING AND AT BEDTIME 09/04/24  Yes Segal, Jared E, MD  Ascorbic Acid (VITAMIN C) 1000 MG tablet Take 1,000 mg by mouth daily.   Yes  [provider]  furosemide  (LASIX ) 20 MG tablet Take 1 tablet (20 mg total) by mouth every other day. 09/14/24  Yes Segal, Jared E, MD  levothyroxine  (SYNTHROID ) 50 MCG tablet TAKE 1 TABLET BY MOUTH ONCE DAILY BEFORE BREAKFAST APPOINTMENT  REQUIRED  FOR  FUTURE  REFILLS 09/04/24  Yes [name], MD  LUMIGAN 0.01 % SOLN Place 1 drop into both eyes at bedtime.  08/08/15  Yes [provider]  metoprolol  tartrate (LOPRESSOR ) 25 MG tablet Take 3 tablets (75 mg total) by mouth daily. 02/24/24  Yes [name], MD  Multiple Vitamins-Minerals (PRESERVISION AREDS 2 PO) Take 1 tablet by mouth daily.   Yes [provider]  Multiple Vitamins-Minerals (WOMENS 50+ MULTI VITAMIN/MIN PO) Take 1 tablet by mouth daily.   Yes [provider]  Omega-3 Fatty Acids (FISH OIL TRIPLE STRENGTH) 1400 MG CAPS Take 1,400 mg by mouth daily.   Yes [provider]  pantoprazole  (PROTONIX ) 40 MG tablet Take 1 tablet (40 mg total) by mouth daily. TAKE 1 TABLET BY MOUTH ONCE DAILY Patient taking differently: Take 40 mg by mouth daily. 05/08/24  Yes [name], MD  Perfluorohexyloctane (MIEBO) 1.338 GM/ML SOLN Place 1.338 mLs into both eyes 4 (four) times daily.   Yes [provider]  potassium chloride  (KLOR-CON ) 10 MEQ tablet Take 10 mEq by mouth every other day. 09/14/24  Yes [provider]  Rivaroxaban  (XARELTO ) 15 MG TABS tablet TAKE 1 TABLET BY MOUTH ONCE DAILY  WITH SUPPER 05/08/24  Yes Nahser, Aleene PARAS, MD  vitamin k 100 MCG tablet Take 100 mcg by mouth daily.   Yes [provider]  lidocaine -prilocaine  (EMLA ) cream Apply to affected area once Patient not taking: Reported on 10/02/2024 09/26/24   Sherrod Sherrod, MD  ondansetron  (ZOFRAN ) 8 MG tablet Take 1 tablet (8 mg total) by mouth every 8 (eight) hours as needed for nausea or vomiting. Start on the third day after carboplatin. Patient not taking: Reported on 10/02/2024 09/26/24   Sherrod Sherrod,  MD  prochlorperazine  (COMPAZINE ) 10 MG tablet Take 1 tablet (10 mg total) by mouth every 6 (six) hours as needed for nausea or vomiting. Patient not taking: Reported on 10/02/2024 09/26/24   Sherrod Sherrod, MD    Allergies: Budesonide, Elavil  [amitriptyline ], Gabapentin , Tramadol , Hydrocodone, and Ipratropium    Review of Systems  Respiratory:  Positive for shortness of breath.   Cardiovascular:  Positive for leg swelling.  All other systems reviewed and are negative.   Updated Vital Signs BP (!) 151/100 (BP Location: Right Arm)   Pulse 87   Temp 97.6 F (36.4 C) (Oral)   Resp 18   SpO2 100%   Physical Exam Vitals and nursing note reviewed.  Constitutional:      General: She is awake.     Appearance: Normal appearance. She is well-developed, well-groomed and normal weight. She is ill-appearing.     Interventions: Nasal cannula in place.  HENT:     Head: Normocephalic and atraumatic.     Mouth/Throat:     Mouth: Mucous membranes are moist.     Pharynx: Oropharynx is clear.  Eyes:     Extraocular Movements: Extraocular movements intact.     Conjunctiva/sclera: Conjunctivae normal.     Pupils: Pupils are equal, round, and reactive to light.  Neck:     Vascular: No JVD.     Trachea: Trachea and phonation normal.     Comments: No JVD appreciated with patient sitting at 90 degrees however unable to recline secondary to orthopnea. Cardiovascular:     Rate and Rhythm: Normal rate. Rhythm irregular.     Pulses: Normal pulses.     Heart sounds: Normal heart sounds. No murmur heard.    No friction rub. No gallop.  Pulmonary:     Effort: Pulmonary effort is normal. Tachypnea present. No accessory muscle usage or retractions.     Breath sounds: Examination of the right-lower field reveals decreased breath sounds. Examination of the left-lower field reveals decreased breath sounds. Decreased breath sounds present.     Comments: Increased subjective work of breathing.  Patient  speaking and short sentences due to shortness of breath. Abdominal:     General: Abdomen is flat. Bowel sounds are normal.     Palpations: Abdomen is soft.     Tenderness: There is no abdominal tenderness.  Musculoskeletal:        General: Normal range of motion.     Cervical back: Normal range of motion and neck supple.     Right lower leg: 3+ Edema present.     Left lower leg: 3+ Pitting Edema present.  Skin:    General: Skin is warm and dry.     Capillary Refill: Capillary refill takes less than 2 seconds.  Neurological:     General: No focal deficit present.     Mental Status: She is alert. Mental status is at baseline.  Psychiatric:        Mood and Affect: Mood normal.  Behavior: Behavior is cooperative.     (all labs ordered are listed, but only abnormal results are displayed) Labs Reviewed  BRAIN NATRIURETIC PEPTIDE - Abnormal; Notable for the following components:      Result Value   B Natriuretic Peptide 667.9 (*)    All other components within normal limits  CBC WITH DIFFERENTIAL/PLATELET - Abnormal; Notable for the following components:   RBC 2.97 (*)    Hemoglobin 8.6 (*)    HCT 28.5 (*)    All other components within normal limits  I-STAT CHEM 8, ED - Abnormal; Notable for the following components:   Creatinine, Ser 1.80 (*)    Glucose, Bld 101 (*)    Calcium , Ion 1.12 (*)    Hemoglobin 8.8 (*)    HCT 26.0 (*)    All other components within normal limits  TROPONIN I (HIGH SENSITIVITY) - Abnormal; Notable for the following components:   Troponin I (High Sensitivity) 22 (*)    All other components within normal limits  MAGNESIUM  COMPREHENSIVE METABOLIC PANEL WITH GFR  TROPONIN I (HIGH SENSITIVITY)    EKG: EKG Interpretation Date/Time:  Monday October 02 2024 12:02:52 EST Ventricular Rate:  71 PR Interval:    QRS Duration:  91 QT Interval:  401 QTC Calculation: 436 R Axis:   -45  Text Interpretation: Atrial fibrillation Left anterior  fascicular block Anteroseptal infarct, age indeterminate no sig change from previous Confirmed by Armenta Canning [phone]) on 10/02/2024 12:41:57 PM  Radiology: [facility] Chest Portable 1 View Result Date: 10/02/2024 CLINICAL DATA:  Shortness of breath. EXAM: PORTABLE CHEST 1 VIEW COMPARISON:  Chest radiograph dated 09/28/2024. FINDINGS: Bilateral pleural effusions, right greater than left, and increased since the prior radiograph. There is bibasilar atelectasis or pneumonia. No pneumothorax. Stable cardiac silhouette. No acute osseous pathology. IMPRESSION: Bilateral pleural effusions, right greater than left, and increased since the prior radiograph. Electronically Signed   By: Vanetta Chou M.D.   On: 10/02/2024 13:14     .Critical Care  Performed by: Myriam Dorn BROCKS, PA Authorized by: Myriam Dorn BROCKS, PA   Critical care provider statement:    Critical care time (minutes):  30   Critical care time was exclusive of:  Separately billable procedures and treating other patients   Critical care was necessary to treat or prevent imminent or life-threatening deterioration of the following conditions:  Respiratory failure and circulatory failure   Critical care was time spent personally by me on the following activities:  Discussions with consultants, evaluation of patient's response to treatment, examination of patient, interpretation of cardiac output measurements, obtaining history from patient or surrogate, ordering and performing treatments and interventions, ordering and review of laboratory studies, ordering and review of radiographic studies, pulse oximetry and re-evaluation of patient's condition   Care discussed with: admitting provider      Medications Ordered in the ED  furosemide  (LASIX ) injection 40 mg (40 mg Intravenous Given 10/02/24 1245)  potassium chloride  SA (KLOR-CON  M) CR tablet 20 mEq (20 mEq Oral Given 10/02/24 1237)                                    Medical  Decision Making Amount and/or Complexity of Data Reviewed Labs: ordered. Radiology: ordered.  Risk Prescription drug management. Decision regarding hospitalization.   Medical Decision Making:   Carolyn Wells is a 83 y.o. female who presented to the ED today with acute  shortness of breath and lower extremity edema detailed above.    Handoff received from EMS.  External chart has been reviewed including outpatient cardiology records as well as echocardiogram and previous labs and imaging.. Patient's presentation is complicated by their history of CHF as well as CKD and COPD.  Recent diagnosis of right lobar adenocarcinoma..  Patient placed on continuous vitals and telemetry monitoring while in ED which was reviewed periodically.  Complete initial physical exam performed, notably the patient  was alert and oriented and with increased work of breathing..    Reviewed and confirmed nursing documentation for past medical history, family history, social history.    Initial Assessment:   With the patient's presentation of acute dyspnea and lower extremity edema, most likely diagnosis is CHF exacerbation.   Further consider possible acute pneumonia, COPD exacerbation, pleural effusion secondary to her previous lung malignancy. This is most consistent with an acute complicated illness  Initial Plan:  Initial diuresis with 40 mg of Lasix  and administer potassium tablets to prevent hypokalemia. Screening labs including CBC and Metabolic panel to evaluate for infectious or metabolic etiology of disease.  CXR to evaluate for structural/infectious intrathoracic pathology.  EKG/Troponin testing/BNP testing to evaluate for cardiac pathology. Secondary to increased work of breathing, trial BiPAP, otherwise maintained on oxygen by nasal cannula to prevent hypoxia. Objective evaluation as below reviewed   Initial Study Results:   Laboratory  All laboratory results reviewed without evidence of  clinically relevant pathology.   Exceptions include: BNP is increased to 667.9.  Creatinine is 1.8, hemoglobin is decreased to 8.6.  MCV is 96.  EKG EKG was reviewed independently. Rate, rhythm, axis, intervals all examined and without medically relevant abnormality. ST segments without concerns for elevations.    Radiology:  All images reviewed independently. Agree with radiology report at this time.   DG Chest Portable 1 View Result Date: 10/02/2024 CLINICAL DATA:  Shortness of breath. EXAM: PORTABLE CHEST 1 VIEW COMPARISON:  Chest radiograph dated 09/28/2024. FINDINGS: Bilateral pleural effusions, right greater than left, and increased since the prior radiograph. There is bibasilar atelectasis or pneumonia. No pneumothorax. Stable cardiac silhouette. No acute osseous pathology. IMPRESSION: Bilateral pleural effusions, right greater than left, and increased since the prior radiograph. Electronically Signed   By: Vanetta Chou M.D.   On: 10/02/2024 13:14    Consults: Case discussed with Dr. Trudy with the hospitalist team.   Reassessment and Plan:   Discussed case with Dr. Trudy the hospitalist team who agrees to accept this patient for admission for CHF exacerbation as well as right-sided pleural effusion.  X-ray imaging did demonstrate a right-sided pleural effusion which is significantly increased over the last 4 days with prior imaging being on 28 September 2024.  She was provided with initial IV diuresis of 40 mg furosemide , on reassessment patient's condition is significantly improved.  Has had large volume of urine output.  Discussed need for admission given the dramatic increase in the patient's right sided pleural effusion, likely need for thoracentesis.  Further will need management of her CHF, adjustment in her at home diuresis.  She understands and agrees to the need for admission and has no further concerns at this time.       Final diagnoses:  Acute on chronic combined  systolic and diastolic congestive heart failure (HCC)  Pleural effusion    ED Discharge Orders     None          Myriam Dorn BROCKS, [place] 10/02/24 1547  Myriam Dorn BROCKS, PA 10/02/24 1550    Myriam Dorn BROCKS, [place] 10/02/24 1614    Armenta Canning, MD 10/03/24 [phone]

## 2024-10-02 NOTE — Patient Instructions (Signed)
 Medication Instructions:  No medication changes were made at this visit. Continue current regimen.  *If you need a refill on your cardiac medications before your next appointment, please call your pharmacy*  Follow-Up: At [facility], you and your health needs are our priority.  As part of our continuing mission to provide you with exceptional heart care, our providers are all part of one team.  This team includes your primary Cardiologist (physician) and Advanced Practice Providers or APPs (Physician Assistants and Nurse Practitioners) who all work together to provide you with the care you need, when you need it.  Your next appointment:   2 week(s)  Provider:   Emeline FORBES Calender, MD    We recommend signing up for the patient portal called MyChart.  Sign up information is provided on this After Visit Summary.  MyChart is used to connect with patients for Virtual Visits (Telemedicine).  Patients are able to view lab/test results, encounter notes, upcoming appointments, etc.  Non-urgent messages can be sent to your provider as well.   To learn more about what you can do with MyChart, go to [email or web address].

## 2024-10-02 NOTE — ED Triage Notes (Signed)
 Pt. BIB [facility] from HF clinic with c/o Shob; Pt. Stated that this started last night, she stated that she felt like she could not breathe when she laid down to go to sleep; Pt. Has a Hx of HF, A-fib,  and has had multiple thoracentesis in the past couple of weeks. Pt. Has a 20G in L AC  VS per [facility]:  BP: 140/76 HR: 67 A-fib  A/O x4

## 2024-10-02 NOTE — Progress Notes (Signed)
 Cardiology Office Note   Date:  10/02/2024  ID:  Carolyn Wells, DOB 09-24-1941, MRN [id number] PCP: [name], MD  [facility] Providers Cardiologist:  Aleene Passe, MD (Inactive)     History of Present Illness Carolyn Wells is a 82 y.o. female who presents with her daughter with a past medical history of longstanding persistent atrial fibrillation on Xarelto  s/p cardioversion 08/30/2024, recently diagnosed stage IV metastatic non-small cell lung cancer with recurrent pleural effusions requiring recurrent thoracentesis and plan to be ordered on noncurative/palliative intent chemotherapy (pembrolizumab, paclitaxel, carboplatin), hypertension, aortic atherosclerosis, chronic venous insufficiency, CKD 4 who presents today for follow-up.  She was seen by myself on 08/10/2024 with shortness of breath and dyspnea on exertion and was referred for cardioversion which was performed on 08/30/2024.  She had a chest x-ray on 08/25/2024 that showed masslike prominence of the right aspect of the upper mediastinum suspicious for mass or lymphadenopathy associated with moderate right and small left pleural effusions.  She underwent a ultrasound-guided right thoracentesis with 1 L of pleural fluid drained on 09/06/2024 and was then diagnosed with metastatic adenocarcinoma.  She has since undergone multiple thoracenteses.  Today, she presents with her husband and daughter.  She has been having worsening shortness of breath, lower extremity swelling and orthopnea.  Last night she was unable to lay flat.  She has been having epigastric tightness that is worsening.  Has been taking p.o. Lasix  every other day without much improvement.  Recently in the hospital for heart failure exacerbation.  ROS:  Review of Systems  All other systems reviewed and are negative.   Physical Exam  Physical Exam Vitals and nursing note reviewed.  Constitutional:      Appearance: Normal appearance.  HENT:      Head: Normocephalic and atraumatic.  Eyes:     Conjunctiva/sclera: Conjunctivae normal.  Neck:     Comments: Unable to recline to evaluate JVD Cardiovascular:     Rate and Rhythm: Normal rate. Rhythm irregular.     Comments: Significant orthopnea at 45 degree Pulmonary:     Comments: Decreased breath sounds bilateral lower lung fields with rales at mid lung Musculoskeletal:     Right lower leg: Edema present.     Left lower leg: Edema present.  Neurological:     Mental Status: She is alert.     VS:  BP 110/64   Pulse 73   Ht 5' 6 (1.676 m)   Wt 146 lb 6.4 oz (66.4 kg)   SpO2 94%   BMI 23.63 kg/m         Wt Readings from Last 3 Encounters:  10/02/24 146 lb 6.4 oz (66.4 kg)  09/26/24 140 lb (63.5 kg)  09/25/24 140 lb (63.5 kg)     EKG Interpretation Date/Time:    Ventricular Rate:    PR Interval:    QRS Duration:    QT Interval:    QTC Calculation:   R Axis:      Text Interpretation:      Studies Reviewed   Echocardiogram 01/07/2023: EF 50 to 55% with low normal ejection function and no regional wall motion abnormalities Mild LVH Mildly reduced RV function and mildly increased RV wall thickness Moderately elevated pulmonary artery systolic pressure at 47.4 mmHg Moderately dilated left atrium Mildly dilated right atrium Mild MR Aortic valve calcification     Risk Assessment/Calculations   CHA2DS2-VASc Score = 5   This indicates a 7.2% annual risk of stroke. The  patient's score is based upon: CHF History: 0 HTN History: 1 Diabetes History: 0 Stroke History: 0 Vascular Disease History: 1 Age Score: 2 Gender Score: 1             ASSESSMENT  Shortness of breath secondary to acute on chronic diastolic heart failure and/or recurrent malignant pleural effusions significant orthopnea, lower extremity edema and bilateral lung field reduced breath sounds with rales. Longstanding persistent atrial fibrillation, s/p cardioversion 08/30/2024  CHA2DS2-VASc: 5.  Stroke prophylaxis with renally dosed Xarelto  15 mg and rate controlled on metoprolol  tartrate 75 mg daily.  Did not tolerate amiodarone .   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  She prefers to go via ambulance.  Will arrange transportation  Follow up: 2 weeks posthospitalization          Signed, Emeline Calender, DO

## 2024-10-02 NOTE — Telephone Encounter (Signed)
 I mailed an appointment reminder due to patient being in ED for palliative care appointment.

## 2024-10-02 NOTE — H&P (Signed)
 History and Physical    Carolyn Wells FMW:[id number] DOB: [birth year] DOA: 10/02/2024  PCP: [name], MD Patient coming from: home   Chief Complaint: shortness of breath   HPI: 83 y/o F w/ PMH of lung cancer (has not started treatments yet), pleural effusions s/p several thoracentesis, CHF, hypothyroidism, HTN, GERD, CKDIII, PAF who presented w/ shortness of breath x 2 months. The shortness of breath has become progressively worse. The shortness of breath is at rest as well as with exertion. Pt could not lay flat and sleeps on 1 pillow at night but has bed that the head of the bed can be raised. Pt takes lasix  every other day and has been taking lasix  as prescribed. Pt does not monitor how much fluid and/or salt intake daily. Pt denies any fever, chills, sweating, nausea, vomiting, abd pain, nausea, vomiting, dysuria, urinary urgency, urinary frequency, diarrhea or constipation. Of note, pt does c/o chest pain that is tender to palpation.   Review of Systems: As per HPI otherwise 14 point review of systems negative.   Past Medical History:  Diagnosis Date   ABDOMINAL INCISIONAL HERNIA 08/13/2009   s/p repair   Allergic rhinitis, cause unspecified 09/28/2011   BRADYCARDIA 09/26/2010   CKD (chronic kidney disease), stage III (HCC) 02/26/2015   Solitary kidney   COLONIC POLYPS, HX OF 08/25/2007   Complication of anesthesia    DIVERTICULOSIS, COLON 08/25/2007   Dysrhythmia    GASTROESOPHAGEAL REFLUX DISEASE 08/04/2007   HYPERLIPIDEMIA 09/27/2010   not on Rx therapy   HYPERTENSION 08/06/2007   Hypothyroidism    INTERNAL HEMORRHOIDS 08/04/2007   LOW BACK PAIN 08/06/2007   scoliosis; DJD; gets injections   Lung cancer (HCC)    OSTEOPENIA 08/04/2007   PAF (paroxysmal atrial fibrillation) (HCC)    a.  Echo 2/17:  Vigorous LVF, EF 65-70%, no RWMA, Ao sclerosis, trivial AI, trivial MR, trivial TR, PASP 23 mmHg   PONV (postoperative nausea and vomiting)    Solitary kidney,  acquired 09/28/2011   donated R kidney to husband    Past Surgical History:  Procedure Laterality Date   CARDIOVERSION [name] 08/30/2024   Procedure: CARDIOVERSION;  Surgeon: Francyne Headland, MD;  [facility]: [facility];  Service: Cardiovascular;  Laterality: [name];   IR THORACENTESIS ASP PLEURAL SPACE W/IMG GUIDE  09/06/2024   IR THORACENTESIS ASP PLEURAL SPACE W/IMG GUIDE  09/14/2024   IR THORACENTESIS ASP PLEURAL SPACE W/IMG GUIDE  09/28/2024   LAMINECTOMY  1999   L4-5/lumbar   NEPHRECTOMY     Right   s/o incisional hernia repair     RUQ   STRABISMUS SURGERY Bilateral 04/14/2019   Procedure: STRABISMUS REPAIR BILATERAL;  Surgeon: Neysa Fallow, MD;  Location: [facility];  Service: Ophthalmology;  Laterality: Bilateral;   TONSILLECTOMY       reports that she has never smoked. She has never been exposed to tobacco smoke. She has never used smokeless tobacco. She reports that she does not drink alcohol and does not use drugs.  Allergies  Allergen Reactions   Budesonide Other (See Comments)    Causes nasal bleeding   Elavil  [Amitriptyline ] Other (See Comments)    Feels bad   Gabapentin  Other (See Comments)    Loopy effect   Tramadol  Other (See Comments)    Loopy feeling   Hydrocodone Nausea Only and Other (See Comments)    lethargy   Ipratropium     Causes nasal bleeding    Family History  Problem  Relation Age of Onset   Coronary artery disease Mother 36       1st degree relative    Diabetes Mother    Heart attack Father 53   Diabetes Other        1st degree relative   Alzheimer's disease Brother     Prior to Admission medications   Medication Sig Start Date End Date Taking? Authorizing Provider  acetaminophen  (TYLENOL ) 500 MG tablet Take 500 mg by mouth daily as needed for moderate pain (pain score 4-6) or mild pain (pain score 1-3).   Yes [provider]  ALPRAZolam  (XANAX ) 1 MG tablet Take 1 mg by mouth at bedtime.   Yes [provider]  amLODipine  (NORVASC ) 2.5 MG tablet TAKE 1 TABLET BY MOUTH IN THE MORNING AND AT BEDTIME 09/04/24  Yes Segal, Jared E, MD  Ascorbic Acid (VITAMIN C) 1000 MG tablet Take 1,000 mg by mouth daily.   Yes [provider]  furosemide  (LASIX ) 20 MG tablet Take 1 tablet (20 mg total) by mouth every other day. 09/14/24  Yes Segal, Jared E, MD  levothyroxine  (SYNTHROID ) 50 MCG tablet TAKE 1 TABLET BY MOUTH ONCE DAILY BEFORE BREAKFAST APPOINTMENT  REQUIRED  FOR  FUTURE  REFILLS 09/04/24  Yes [name], MD  LUMIGAN 0.01 % SOLN Place 1 drop into both eyes at bedtime.  08/08/15  Yes [provider]  metoprolol  tartrate (LOPRESSOR ) 25 MG tablet Take 3 tablets (75 mg total) by mouth daily. 02/24/24  Yes [name], MD  Multiple Vitamins-Minerals (PRESERVISION AREDS 2 PO) Take 1 tablet by mouth daily.   Yes [provider]  Multiple Vitamins-Minerals (WOMENS 50+ MULTI VITAMIN/MIN PO) Take 1 tablet by mouth daily.   Yes [provider]  Omega-3 Fatty Acids (FISH OIL TRIPLE STRENGTH) 1400 MG CAPS Take 1,400 mg by mouth daily.   Yes [provider]  pantoprazole  (PROTONIX ) 40 MG tablet Take 1 tablet (40 mg total) by mouth daily. TAKE 1 TABLET BY MOUTH ONCE DAILY Patient taking differently: Take 40 mg by mouth daily. 05/08/24  Yes [name], MD  Perfluorohexyloctane (MIEBO) 1.338 GM/ML SOLN Place 1.338 mLs into both eyes 4 (four) times daily.   Yes [provider]  potassium chloride  (KLOR-CON ) 10 MEQ tablet Take 10 mEq by mouth every other day. 09/14/24  Yes [provider]  Rivaroxaban  (XARELTO ) 15 MG TABS tablet TAKE 1 TABLET BY MOUTH ONCE DAILY WITH SUPPER 05/08/24  Yes Nahser, Aleene PARAS, MD  vitamin k 100 MCG tablet Take 100 mcg by mouth daily.   Yes [provider]  lidocaine -prilocaine  (EMLA ) cream Apply to affected area once Patient not taking: Reported on 10/02/2024 09/26/24   Sherrod Sherrod, MD  ondansetron  (ZOFRAN ) 8  MG tablet Take 1 tablet (8 mg total) by mouth every 8 (eight) hours as needed for nausea or vomiting. Start on the third day after carboplatin. Patient not taking: Reported on 10/02/2024 09/26/24   Sherrod Sherrod, MD  prochlorperazine  (COMPAZINE ) 10 MG tablet Take 1 tablet (10 mg total) by mouth every 6 (six) hours as needed for nausea or vomiting. Patient not taking: Reported on 10/02/2024 09/26/24   Sherrod Sherrod, MD    Physical Exam: Vitals:   10/02/24 1215 10/02/24 1217 10/02/24 1230 10/02/24 1337  BP: (!) 163/88   (!) 151/100  Pulse: 68 94  87  Resp: 20 (!) 25  18  Temp:    97.6 F (36.4 C)  TempSrc:  Oral  SpO2: 100% 100% 100% 100%    Constitutional: NAD, calm, comfortable Vitals:   10/02/24 1215 10/02/24 1217 10/02/24 1230 10/02/24 1337  BP: (!) 163/88   (!) 151/100  Pulse: 68 94  87  Resp: 20 (!) 25  18  Temp:    97.6 F (36.4 C)  TempSrc:    Oral  SpO2: 100% 100% 100% 100%   Eyes: PERRL, lids and conjunctivae normal ENMT: Mucous membranes are moist.  Neck: normal, supple Respiratory: decreased breath sounds b/l. No wheezes. No accessory muscle use.  Cardiovascular: irregularly irregular. No rubs / gallops. 2+ pitting edema of b/l LE.  Abdomen: soft, no tenderness,  ND, hypoactive bowel sounds  Musculoskeletal: no clubbing / cyanosis. No joint deformity upper and lower extremities. Skin: no rashes, lesions, ulcers.  Neurologic: CN 2-12 grossly intact. Moves all extremities  Psychiatric: judgement and insight appears at baseline. Flat mood and affect    Labs on Admission: I have personally reviewed following labs and imaging studies  CBC: Recent Labs  Lab 10/02/24 1245 10/02/24 1259  WBC 6.9  --   NEUTROABS 5.5  --   HGB 8.6* 8.8*  HCT 28.5* 26.0*  MCV 96.0  --   PLT 356  --    Basic Metabolic Panel: Recent Labs  Lab 10/02/24 1259  NA 141  K 4.7  CL 110  GLUCOSE 101*  BUN 18  CREATININE 1.80*   GFR: Estimated Creatinine Clearance: 21.8  mL/min (A) (by C-G formula based on SCr of 1.8 mg/dL (H)). Liver Function Tests: No results for input(s): AST, ALT, ALKPHOS, BILITOT, PROT, ALBUMIN in the last 168 hours. No results for input(s): LIPASE, AMYLASE in the last 168 hours. No results for input(s): AMMONIA in the last 168 hours. Coagulation Profile: No results for input(s): INR, PROTIME in the last 168 hours. Cardiac Enzymes: No results for input(s): CKTOTAL, CKMB, CKMBINDEX, TROPONINI in the last 168 hours. BNP (last 3 results) Recent Labs    08/25/24 1349  PROBNP 695.0*   HbA1C: No results for input(s): HGBA1C in the last 72 hours. CBG: No results for input(s): GLUCAP in the last 168 hours. Lipid Profile: No results for input(s): CHOL, HDL, LDLCALC, TRIG, CHOLHDL, LDLDIRECT in the last 72 hours. Thyroid  Function Tests: No results for input(s): TSH, T4TOTAL, FREET4, T3FREE, THYROIDAB in the last 72 hours. Anemia Panel: No results for input(s): VITAMINB12, FOLATE, FERRITIN, TIBC, IRON, RETICCTPCT in the last 72 hours. Urine analysis:    Component Value Date/Time   COLORURINE YELLOW 11/18/2023 1340   APPEARANCEUR CLEAR 11/18/2023 1340   LABSPEC 1.025 11/18/2023 1340   PHURINE 5.5 11/18/2023 1340   GLUCOSEU NEGATIVE 11/18/2023 1340   HGBUR NEGATIVE 11/18/2023 1340   BILIRUBINUR NEGATIVE 11/18/2023 1340   KETONESUR TRACE (A) 11/18/2023 1340   UROBILINOGEN 0.2 11/18/2023 1340   NITRITE NEGATIVE 11/18/2023 1340   LEUKOCYTESUR NEGATIVE 11/18/2023 1340    Radiological Exams on Admission: DG Chest Portable 1 View Result Date: 10/02/2024 CLINICAL DATA:  Shortness of breath. EXAM: PORTABLE CHEST 1 VIEW COMPARISON:  Chest radiograph dated 09/28/2024. FINDINGS: Bilateral pleural effusions, right greater than left, and increased since the prior radiograph. There is bibasilar atelectasis or pneumonia. No pneumothorax. Stable cardiac silhouette. No acute  osseous pathology. IMPRESSION: Bilateral pleural effusions, right greater than left, and increased since the prior radiograph. Electronically Signed   By: Vanetta Chou M.D.   On: 10/02/2024 13:14    EKG: Independently reviewed.   Assessment/Plan Active Problems:   CHF exacerbation (  HCC)  Possible acute on chronic diastolic CHF exacerbation: continue on IV lasix . Monitor I/Os. Continue on home dose of metoprolol .   Recurrent pleural effusions: R>L. Continue on IV lasix . Will likely need another thoracentesis. Has had several thoracentesis recently  Lung cancer: not started treatment yet. Suppose to get port on 10/09/24 & start chemo sometime after then as per pt's daughter at bedside   PAF: continue on home dose of metoprolol , xarelto .  CKDIII: currently IIIb, unknown if baseline is IIIa or IIIb. Only has 1 kidney b/c the pt donated her kidney to her husband. Will continue to monitor   Hypothyroidism: continue on home dose of levothyroxine    GERD: continue on PPI   DVT prophylaxis: xarelto   Code Status: DNR Family Communication: discussed pt's care w/ pt's daughter, Carolyn Wells, at bedside and answered her questions  Disposition Plan: depends on PT/OT recs  Consults called: none Admission status: observation    Anthony CHRISTELLA Pouch MD [facility]  If 7PM-7AM, please contact night-coverage [email or web address]  10/02/2024, 3:48 PM

## 2024-10-03 ENCOUNTER — Telehealth: Payer: Self-pay | Admitting: Internal Medicine

## 2024-10-03 ENCOUNTER — Telehealth: Payer: Self-pay | Admitting: Nurse Practitioner

## 2024-10-03 ENCOUNTER — Telehealth: Payer: Self-pay | Admitting: *Deleted

## 2024-10-03 ENCOUNTER — Encounter (HOSPITAL_COMMUNITY): Payer: Self-pay | Admitting: Internal Medicine

## 2024-10-03 ENCOUNTER — Inpatient Hospital Stay

## 2024-10-03 DIAGNOSIS — I5043 Acute on chronic combined systolic (congestive) and diastolic (congestive) heart failure: Secondary | ICD-10-CM

## 2024-10-03 DIAGNOSIS — I509 Heart failure, unspecified: Secondary | ICD-10-CM

## 2024-10-03 LAB — CBC
HCT: 31.5 % — ABNORMAL LOW (ref 36.0–46.0)
Hemoglobin: 9.8 g/dL — ABNORMAL LOW (ref 12.0–15.0)
MCH: 28.3 pg (ref 26.0–34.0)
MCHC: 31.1 g/dL (ref 30.0–36.0)
MCV: 91 fL (ref 80.0–100.0)
Platelets: 399 K/uL (ref 150–400)
RBC: 3.46 MIL/uL — ABNORMAL LOW (ref 3.87–5.11)
RDW: 14.3 % (ref 11.5–15.5)
WBC: 7 K/uL (ref 4.0–10.5)
nRBC: 0 % (ref 0.0–0.2)

## 2024-10-03 LAB — BASIC METABOLIC PANEL WITH GFR
Anion gap: 14 (ref 5–15)
BUN: 19 mg/dL (ref 8–23)
CO2: 24 mmol/L (ref 22–32)
Calcium: 9.4 mg/dL (ref 8.9–10.3)
Chloride: 101 mmol/L (ref 98–111)
Creatinine, Ser: 1.71 mg/dL — ABNORMAL HIGH (ref 0.44–1.00)
GFR, Estimated: 29 mL/min — ABNORMAL LOW (ref >60)
Glucose, Bld: 93 mg/dL (ref 70–99)
Potassium: 4.1 mmol/L (ref 3.5–5.1)
Sodium: 139 mmol/L (ref 135–145)

## 2024-10-03 MED ORDER — FUROSEMIDE 10 MG/ML IJ SOLN
40.0000 mg | Freq: Two times a day (BID) | INTRAMUSCULAR | Status: DC
  Administered 2024-10-03: 40 mg via INTRAVENOUS
  Filled 2024-10-03: qty 4

## 2024-10-03 NOTE — Progress Notes (Signed)
 Heart Failure Navigator Progress Note  Assessed for Heart & Vascular TOC clinic readiness.  Patient does not meet criteria due to EF 55-60%, has a scheduled [facility] appointment on 10/12/2024. No HF TOC. .   Navigator will sign off at this time.   Stephane Haddock, BSN, Scientist, Clinical (histocompatibility And Immunogenetics) Only

## 2024-10-03 NOTE — Progress Notes (Signed)
 Patient desats to 86-89% at times, especially when ambulating/mobilizing; however, will go back up to low 90s. Patient denies shortness of breath. Oxygen via nasal cannula offered, patient refused.

## 2024-10-03 NOTE — Telephone Encounter (Signed)
 Rescheduled patients chemo education class for 12/3. Called and spoke with the daughter to discuss, she is aware.

## 2024-10-03 NOTE — ED Notes (Signed)
 Patient states she is done giving blood now after an unsuccessful phlebotomy attempt and and unsuccessful attempt to pull labs from IV.

## 2024-10-03 NOTE — Telephone Encounter (Signed)
 Received vm call from 8:19 am from daughter, [name] requesting to r/s mother's chemo edu class for today since pt is in the hospital.  Message sent to Dr Jeannett RN & to scheduler, Abby.  Will notify daughter that someone should call her.

## 2024-10-03 NOTE — Consult Note (Addendum)
 NAME:  Carolyn Wells, MRN:  [id number], DOB:  08-06-1941, LOS: 0 ADMISSION DATE:  10/02/2024, CONSULTATION DATE:  10/03/24 REFERRING MD:  Sigurd Pac of [facility], CHIEF COMPLAINT:  REcurrent malignant right pleural effusion    History of Present Illness: patient , daughter, chart and Dr SHAUNNA Pac    83 year old female admitted for recurrent right-sided malignant pleural effusion secondary to lung cancer  She is 29 with CKD, allergic rhinitis, acid reflux, hypertension, hyperlipidemia osteopenia, paroxysmal atrial fibrillation and solitary left kidney [status post right kidney donation to husband in 2007].  She got symptomatic in October 2025 which resulted in a diagnosis of stage IV, T2b, N3, M1 non-small cell lung cancer adenocarcinoma because of presentation with right upper lobe mass in addition to right hilar, mediastinal and right supraclavicular lymphadenopathy as well as malignant right pleural effusion diagnosed October 2025.Molecular studies by [facility] 1 showed positive KRAS G12C mutation and PD-L1 expression of 40%   She met Dr. Sherrod for the first time 09/26/2024.  She is awaiting Port-A-Cath.  Starting therapy with carboplatin plus paclitaxel plus Keytruda.  She has had 2 thoracentesis was 110/29/25 and second 111/6/25.  Both on the right side were positive for malignant cells.  She is readmitted for orthopnea, also cough when sitting up and exertional short of breath.  She feels it is because of the pleural effusion getting worse.  Symptoms of always improved during thoracentesis but they are recurrent.  Chest x-ray showed moderate right greater than left pleural effusion.  She had an echo 09/22/2024 withoiut pericardial effusion  Noted she is not on anticoagulation at home  [facility] hospitalist wants opinion on next steps in management as per given significant symptoms.   Past Medical History:    has a past medical history of ABDOMINAL INCISIONAL HERNIA (08/13/2009),  Allergic rhinitis, cause unspecified (09/28/2011), BRADYCARDIA (09/26/2010), CKD (chronic kidney disease), stage III (HCC) (02/26/2015), COLONIC POLYPS, HX OF (08/25/2007), Complication of anesthesia, DIVERTICULOSIS, COLON (08/25/2007), Dysrhythmia, GASTROESOPHAGEAL REFLUX DISEASE (08/04/2007), HYPERLIPIDEMIA (09/27/2010), HYPERTENSION (08/06/2007), Hypothyroidism, INTERNAL HEMORRHOIDS (08/04/2007), LOW BACK PAIN (08/06/2007), Lung cancer (HCC), OSTEOPENIA (08/04/2007), PAF (paroxysmal atrial fibrillation) (HCC), PONV (postoperative nausea and vomiting), and Solitary kidney, acquired (09/28/2011).   reports that she has never smoked. She has never been exposed to tobacco smoke. She has never used smokeless tobacco.  Past Surgical History:  Procedure Laterality Date   CARDIOVERSION [name] 08/30/2024   Procedure: CARDIOVERSION;  Surgeon: Francyne Headland, MD;  Location: [facility];  Service: Cardiovascular;  Laterality: [name];   IR THORACENTESIS ASP PLEURAL SPACE W/IMG GUIDE  09/06/2024   IR THORACENTESIS ASP PLEURAL SPACE W/IMG GUIDE  09/14/2024   IR THORACENTESIS ASP PLEURAL SPACE W/IMG GUIDE  09/28/2024   LAMINECTOMY  1999   L4-5/lumbar   NEPHRECTOMY     Right   s/o incisional hernia repair     RUQ   STRABISMUS SURGERY Bilateral 04/14/2019   Procedure: STRABISMUS REPAIR BILATERAL;  Surgeon: Neysa Fallow, MD;  Location: [facility];  Service: Ophthalmology;  Laterality: Bilateral;   TONSILLECTOMY      Allergies  Allergen Reactions   Budesonide Other (See Comments)    Causes nasal bleeding   Elavil  [Amitriptyline ] Other (See Comments)    Feels bad   Gabapentin  Other (See Comments)    Loopy effect   Tramadol  Other (See Comments)    Loopy feeling   Hydrocodone Nausea Only and Other (See Comments)    lethargy   Ipratropium     Causes nasal  bleeding    Immunization History  Administered Date(s) Administered   Fluad Quad(high Dose 65+) 10/09/2020, 10/08/2021   Fluad  Trivalent(High Dose 65+) 08/04/2019, 10/21/2023   INFLUENZA, HIGH DOSE SEASONAL PF 08/27/2017, 09/16/2018   Influenza Split 09/28/2011, 09/28/2012   Influenza Whole 09/10/2006, 08/25/2007, 08/30/2008, 08/13/2009, 09/26/2010   Influenza, Seasonal, Injecte, Preservative Fre 09/01/2013   Influenza,inj,Quad PF,6+ Mos 08/28/2015   PFIZER(Purple Top)SARS-COV-2 Vaccination 01/11/2020, 02/06/2020, 08/07/2020   Pneumococcal Conjugate-13 09/28/2013   Pneumococcal Polysaccharide-23 08/09/2005, 08/10/2005, 03/27/2011   Td 11/09/1989, 08/13/2009   Tdap 09/19/2019   Zoster Recombinant(Shingrix) 09/20/2019, 11/19/2019   Zoster, Live 07/28/2006    Family History  Problem Relation Age of Onset   Coronary artery disease Mother 73       1st degree relative    Diabetes Mother    Heart attack Father 15   Diabetes Other        1st degree relative   Alzheimer's disease Brother      Current Facility-Administered Medications:    acetaminophen  (TYLENOL ) tablet 650 mg, 650 mg, Oral, Q6H PRN **OR** acetaminophen  (TYLENOL ) suppository 650 mg, 650 mg, Rectal, Q6H PRN, Trudy Anthony HERO, MD   bisacodyl  (DULCOLAX) EC tablet 5 mg, 5 mg, Oral, Daily PRN, Trudy Anthony HERO, MD   furosemide  (LASIX ) injection 40 mg, 40 mg, Intravenous, BID, Fairy Frames, MD   levothyroxine  (SYNTHROID ) tablet 50 mcg, 50 mcg, Oral, Q0600, Trudy Anthony HERO, MD, 50 mcg at 10/03/24 0616   metoprolol  tartrate (LOPRESSOR ) tablet 75 mg, 75 mg, Oral, Daily, Trudy Anthony HERO, MD, 75 mg at 10/03/24 1004   morphine  (PF) 2 MG/ML injection 1 mg, 1 mg, Intravenous, Q4H PRN, Trudy Anthony HERO, MD   ondansetron  (ZOFRAN ) tablet 4 mg, 4 mg, Oral, Q6H PRN **OR** ondansetron  (ZOFRAN ) injection 4 mg, 4 mg, Intravenous, Q6H PRN, Trudy Anthony HERO, MD   oxyCODONE  (Oxy IR/ROXICODONE ) immediate release tablet 5 mg, 5 mg, Oral, Q4H PRN, Trudy Anthony HERO, MD, 5 mg at 10/02/24 1905   pantoprazole  (PROTONIX ) EC tablet 40 mg, 40 mg, Oral,  Daily, Trudy, Jamiese M, MD, 40 mg at 10/03/24 1005   sodium chloride  flush (NS) 0.9 % injection 3 mL, 3 mL, Intravenous, Q12H, Trudy Anthony HERO, MD, 3 mL at 10/03/24 1005     Significant Hospital Events:  10/02/2024 - admit   Interim History / Subjective:   10/03/2024 - seen in bed 311 at [facility] inpatient services  Objective   Blood pressure (!) 146/79, pulse 71, temperature 97.7 F (36.5 C), temperature source Oral, resp. rate 20, SpO2 97%.       No intake or output data in the 24 hours ending 10/03/24 1317 There were no vitals filed for this visit.  Examination: General: Frail female 44 degree lying in bed HENT: Supple neck no elevated JVP Lungs: Clear to auscultation bilaterally but with bases diminished particularly more diminished on the right side Cardiovascular: Normal heart sounds Abdomen: Soft nontender no organomegaly Extremities: Slight chronic venous stasis edema present Neuro: Alert and oriented x 3.  Speech normal GU: Not examined  Resolved Hospital Problem list   x  Assessment & Plan:  ASSESSMENT / PLAN:  PULMONARY  A:  Stage IV non-small cell lung cancer diagnosed October 2025 awaiting chemotherapy and Port-A-Cath  Recurrent right malignant pleural effusion that is symptomatic  10/03/2024 -> currently having cough with sitting up dyspnea on exertion and some amount of orthopnea.  P:   Do not do thoracentesis Recommend PleurX catheter on the right  side  - IR consult  - I discussed the pros, cons and limitations of PleurX catheter management but given symptoms they want to proceed  - Daughter feels she is very capable to manage and support patient with this at home   Best practice (daily eval):  According to the hospitalist      SIGNATURE    Dr. Dorethia Cave, M.D., F.C.C.P,  Pulmonary and Critical Care Medicine Staff Physician, [facility] Center Director - Interstitial Lung Disease  Program  Pulmonary  Fibrosis [facility] at [facility] [place], [place], [address]  NPI Number:  NPI #[id number]  Pager: [phone], If no answer  -> Check AMION or Try [phone] Telephone (clinical office): [phone] Telephone (research): [phone]  1:17 PM 10/03/2024   10/03/2024 1:17 PM    LABS    PULMONARY Recent Labs  Lab 10/02/24 1259  TCO2 22    CBC Recent Labs  Lab 10/02/24 1245 10/02/24 1259  HGB 8.6* 8.8*  HCT 28.5* 26.0*  WBC 6.9  --   PLT 356  --     COAGULATION No results for input(s): INR in the last 168 hours.  CARDIAC  No results for input(s): TROPONINI in the last 168 hours. No results for input(s): PROBNP in the last 168 hours.  CHEMISTRY Recent Labs  Lab 10/02/24 1245 10/02/24 1259  NA 144 141  K 4.7 4.7  CL 106 110  CO2 20*  --   GLUCOSE 101* 101*  BUN 18 18  CREATININE 1.70* 1.80*  CALCIUM  9.3  --   MG 1.8  --    Estimated Creatinine Clearance: 21.8 mL/min (A) (by C-G formula based on SCr of 1.8 mg/dL (H)).   LIVER Recent Labs  Lab 10/02/24 1245  AST 26  ALT 23  ALKPHOS 119  BILITOT 0.7  PROT 6.2*  ALBUMIN 2.9*     INFECTIOUS No results for input(s): LATICACIDVEN, PROCALCITON in the last 168 hours.   ENDOCRINE CBG (last 3)  No results for input(s): GLUCAP in the last 72 hours.       IMAGING x48h  - image(s) personally visualized  -   highlighted in bold DG Chest Portable 1 View Result Date: 10/02/2024 CLINICAL DATA:  Shortness of breath. EXAM: PORTABLE CHEST 1 VIEW COMPARISON:  Chest radiograph dated 09/28/2024. FINDINGS: Bilateral pleural effusions, right greater than left, and increased since the prior radiograph. There is bibasilar atelectasis or pneumonia. No pneumothorax. Stable cardiac silhouette. No acute osseous pathology. IMPRESSION: Bilateral pleural effusions, right greater than left, and increased since the prior radiograph. Electronically Signed   By: Vanetta Chou M.D.   On: 10/02/2024 13:14

## 2024-10-03 NOTE — Telephone Encounter (Signed)
 I called the patient and her daughter was able to speak with me. I let her know of her mother's echo results and what Dr. Kriste recommended moving forward. She demonstrated understanding and let me know that her mother was currently in the hospital due to lower leg edema and weight gain. She was sent by the triage nurse and wanted to know if we needed to change her dose or frequency on her lasix . I will forward this to Dr. Kriste and let the patient know of his answer.   Per Dr. Kriste,  Your echo shows normal heart squeezing function with a mildly leaky aortic valve and fluid around your lung, which is already known. The fluid is likely causing your shortness of breath. Continue management of this as you're already doing. No other cardiac workup needed at this time.

## 2024-10-03 NOTE — Telephone Encounter (Signed)
 I spoke with patient's daughter to reschedule chemo education due to patient being admitted and new palliative care appointment. Patient and daughter aware of date/time.

## 2024-10-03 NOTE — Telephone Encounter (Signed)
 I called the patient's daughter back and let her know the updated recommendations from Dr. Kriste. She was appreciative and understood the directions. She said she will keep us  updated when her mother gets discharged.   Per Dr. Kriste, She's currently hospitalized and they may end up changing her dose at discharge. If they don't then she should be on at least 20 mg daily (instead of every other day).

## 2024-10-03 NOTE — Care Management Obs Status (Signed)
 MEDICARE OBSERVATION STATUS NOTIFICATION   Patient Details  Name: Carolyn Wells MRN: [id number] Date of Birth: 1941-02-25   Medicare Observation Status Notification Given:  Yes    Nena LITTIE Coffee, RN 10/03/2024, 11:21 AM

## 2024-10-03 NOTE — Progress Notes (Signed)
 PROGRESS NOTE    Carolyn Wells  FMW:[id number] DOB: [birth year] DOA: 10/02/2024 PCP: [name], MD  83/F with history of persistent A-fib on Xarelto , chronic diastolic CHF, recently diagnosed metastatic adeno CA lung with recurrent malignant effusions, saw Dr. Sherrod recently and is due to start chemotherapy soon after port placement.  In the meantime she has had 3 thoracentesis for recurrent malignant effusion, last completed on 11/10. - Presented to the ED with worsening shortness of breath, swelling orthopnea.  Takes Lasix  every other day, in the ED tachypneic, mildly hypoxic placed on 2 L O2, BNP 614, troponin 18, creatinine 1.5, WBC 8, chest x-ray with bilateral effusion worse on the right   Subjective: -Feels short of breath, orthopneic  Assessment and Plan:  Recurrent malignant effusion - Has had numerous thoracentesis on the right - Will discuss with pulmonary regarding PleurX, hold Xarelto   Newly diagnosed stage IV non-small cell lung cancer - Just saw Dr. Gatha last week, due to start chemotherapy soon after port placement next week  Acute on chronic diastolic CHF - Echo with EF 55-60%, normal RV, no significant valvular disease - Continue IV Lasix , increased dose to 40 mg twice daily, continue metoprolol  for persistent A-fib  Persistent atrial fibrillation -Remains in A-fib, rate controlled, continue metoprolol , holding Xarelto   Hypothyroidism Continue Synthroid   DVT prophylaxis: SCDs Code Status: DNR Family Communication: Discussed with patient detail, no family at bedside  Disposition Plan:   Consultants:    Procedures:   Antimicrobials:    Objective: Vitals:   10/03/24 0900 10/03/24 1000 10/03/24 1011 10/03/24 1045  BP: 135/68 (!) 146/79    Pulse: 77 63  71  Resp: 15 18  20   Temp:   97.7 F (36.5 C)   TempSrc:   Oral   SpO2: 97% 96%  97%   No intake or output data in the 24 hours ending 10/03/24 1154 There were no vitals filed for this  visit.  Examination:  General exam: Appears calm and comfortable, AO x 3 HEENT: Positive JVD Respiratory system: Decreased breath sounds at the bases Cardiovascular system: S1 & S2 heard, RRR.  Abd: nondistended, soft and nontender.Normal bowel sounds heard. Central nervous system: Alert and oriented. No focal neurological deficits. Extremities: 1+ edema Skin: No rashes Psychiatry:  Mood & affect appropriate.     Data Reviewed:   CBC: Recent Labs  Lab 10/02/24 1245 10/02/24 1259  WBC 6.9  --   NEUTROABS 5.5  --   HGB 8.6* 8.8*  HCT 28.5* 26.0*  MCV 96.0  --   PLT 356  --    Basic Metabolic Panel: Recent Labs  Lab 10/02/24 1245 10/02/24 1259  NA 144 141  K 4.7 4.7  CL 106 110  CO2 20*  --   GLUCOSE 101* 101*  BUN 18 18  CREATININE 1.70* 1.80*  CALCIUM  9.3  --   MG 1.8  --    GFR: Estimated Creatinine Clearance: 21.8 mL/min (A) (by C-G formula based on SCr of 1.8 mg/dL (H)). Liver Function Tests: Recent Labs  Lab 10/02/24 1245  AST 26  ALT 23  ALKPHOS 119  BILITOT 0.7  PROT 6.2*  ALBUMIN 2.9*   No results for input(s): LIPASE, AMYLASE in the last 168 hours. No results for input(s): AMMONIA in the last 168 hours. Coagulation Profile: No results for input(s): INR, PROTIME in the last 168 hours. Cardiac Enzymes: No results for input(s): CKTOTAL, CKMB, CKMBINDEX, TROPONINI in the last 168 hours. BNP (last 3  results) Recent Labs    08/25/24 1349  PROBNP 695.0*   HbA1C: No results for input(s): HGBA1C in the last 72 hours. CBG: No results for input(s): GLUCAP in the last 168 hours. Lipid Profile: No results for input(s): CHOL, HDL, LDLCALC, TRIG, CHOLHDL, LDLDIRECT in the last 72 hours. Thyroid  Function Tests: No results for input(s): TSH, T4TOTAL, FREET4, T3FREE, THYROIDAB in the last 72 hours. Anemia Panel: No results for input(s): VITAMINB12, FOLATE, FERRITIN, TIBC, IRON, RETICCTPCT in  the last 72 hours. Urine analysis:    Component Value Date/Time   COLORURINE YELLOW 11/18/2023 1340   APPEARANCEUR CLEAR 11/18/2023 1340   LABSPEC 1.025 11/18/2023 1340   PHURINE 5.5 11/18/2023 1340   GLUCOSEU NEGATIVE 11/18/2023 1340   HGBUR NEGATIVE 11/18/2023 1340   BILIRUBINUR NEGATIVE 11/18/2023 1340   KETONESUR TRACE (A) 11/18/2023 1340   UROBILINOGEN 0.2 11/18/2023 1340   NITRITE NEGATIVE 11/18/2023 1340   LEUKOCYTESUR NEGATIVE 11/18/2023 1340   Sepsis Labs: @LABRCNTIP (procalcitonin:4,lacticidven:4)  )No results found for this or any previous visit (from the past 240 hours).   Radiology Studies: DG Chest Portable 1 View Result Date: 10/02/2024 CLINICAL DATA:  Shortness of breath. EXAM: PORTABLE CHEST 1 VIEW COMPARISON:  Chest radiograph dated 09/28/2024. FINDINGS: Bilateral pleural effusions, right greater than left, and increased since the prior radiograph. There is bibasilar atelectasis or pneumonia. No pneumothorax. Stable cardiac silhouette. No acute osseous pathology. IMPRESSION: Bilateral pleural effusions, right greater than left, and increased since the prior radiograph. Electronically Signed   By: Vanetta Chou M.D.   On: 10/02/2024 13:14     Scheduled Meds:  amLODipine   2.5 mg Oral Daily   furosemide   40 mg Intravenous Daily   levothyroxine   50 mcg Oral Q0600   metoprolol  tartrate  75 mg Oral Daily   pantoprazole   40 mg Oral Daily   sodium chloride  flush  3 mL Intravenous Q12H   Continuous Infusions:   LOS: 0 days    Time spent:    Sigurd Pac, MD [facility]   10/03/2024, 11:54 AM

## 2024-10-03 NOTE — Progress Notes (Signed)
   10/03/24 1125  TOC Brief Assessment  Insurance and Status Reviewed  Patient has primary care physician Yes  Home environment has been reviewed From home alone c/supportive family  Prior level of function: Independent  Prior/Current Home Services No current home services  Social Drivers of Health Review SDOH reviewed no interventions necessary  Readmission risk has been reviewed Yes  Transition of care needs no transition of care needs at this time   Transition of Care Department [facility]) has reviewed patient and no other TOC needs have been identified at this time. We will continue to monitor patient advancement through interdisciplinary progression rounds. If new patient needs arise, please place a TOC consult.

## 2024-10-03 NOTE — Telephone Encounter (Signed)
 I left voicemail for patient to return my call if she would like to move her palliative care appointment to 12/5 at 2:00 pm or 12/10 at 1:30 pm.

## 2024-10-04 ENCOUNTER — Encounter (HOSPITAL_COMMUNITY): Payer: Self-pay | Admitting: Internal Medicine

## 2024-10-04 ENCOUNTER — Inpatient Hospital Stay (HOSPITAL_COMMUNITY)

## 2024-10-04 ENCOUNTER — Telehealth: Payer: Self-pay | Admitting: Internal Medicine

## 2024-10-04 DIAGNOSIS — J90 Pleural effusion, not elsewhere classified: Secondary | ICD-10-CM

## 2024-10-04 DIAGNOSIS — I5033 Acute on chronic diastolic (congestive) heart failure: Secondary | ICD-10-CM | POA: Diagnosis not present

## 2024-10-04 HISTORY — PX: IR PERC PLEURAL DRAIN W/INDWELL CATH W/IMG GUIDE: IMG5383

## 2024-10-04 MED ORDER — FENTANYL CITRATE (PF) 100 MCG/2ML IJ SOLN
INTRAMUSCULAR | Status: AC
  Filled 2024-10-04: qty 2

## 2024-10-04 MED ORDER — LIDOCAINE-EPINEPHRINE 1 %-1:100000 IJ SOLN
20.0000 mL | Freq: Once | INTRAMUSCULAR | Status: AC
  Administered 2024-10-04: 15 mL via INTRADERMAL

## 2024-10-04 MED ORDER — LIDOCAINE-EPINEPHRINE 1 %-1:100000 IJ SOLN
INTRAMUSCULAR | Status: AC
  Filled 2024-10-04: qty 1

## 2024-10-04 MED ORDER — FENTANYL CITRATE (PF) 100 MCG/2ML IJ SOLN
INTRAMUSCULAR | Status: AC | PRN
  Administered 2024-10-04 (×2): 25 ug via INTRAVENOUS

## 2024-10-04 MED ORDER — MIDAZOLAM HCL 2 MG/2ML IJ SOLN
INTRAMUSCULAR | Status: AC
  Filled 2024-10-04: qty 2

## 2024-10-04 MED ORDER — KETOROLAC TROMETHAMINE 15 MG/ML IJ SOLN: 15.0000 mg | Freq: Once | INTRAMUSCULAR | Status: DC

## 2024-10-04 MED ORDER — CEFAZOLIN SODIUM-DEXTROSE 2-4 GM/100ML-% IV SOLN
INTRAVENOUS | Status: AC | PRN
  Administered 2024-10-04: 2 g via INTRAVENOUS

## 2024-10-04 MED ORDER — CEFAZOLIN SODIUM-DEXTROSE 2-4 GM/100ML-% IV SOLN: 2.0000 g | INTRAVENOUS | Status: AC

## 2024-10-04 MED ORDER — MIDAZOLAM HCL (PF) 2 MG/2ML IJ SOLN
INTRAMUSCULAR | Status: AC | PRN
  Administered 2024-10-04: 1 mg via INTRAVENOUS
  Administered 2024-10-04: .5 mg via INTRAVENOUS

## 2024-10-04 MED ORDER — CEFAZOLIN SODIUM-DEXTROSE 2-4 GM/100ML-% IV SOLN
INTRAVENOUS | Status: AC
  Filled 2024-10-04: qty 100

## 2024-10-04 MED ORDER — KETOROLAC TROMETHAMINE 15 MG/ML IJ SOLN
7.5000 mg | Freq: Once | INTRAMUSCULAR | Status: AC
  Administered 2024-10-04: 7.5 mg via INTRAVENOUS
  Filled 2024-10-04: qty 1

## 2024-10-04 NOTE — Procedures (Signed)
 Interventional Radiology Procedure:   Indications: Lung cancer and recurrent right pleural effusion  Procedure: Placement of right chest Pleurx catheter  Findings: Pleurx placed in right pleural space, removed 1.7 liters of yellow fluid.    Complications: None     EBL: Minimal  Plan:  CXR on floor.     Halston Fairclough R. Philip, MD  Pager: [phone]

## 2024-10-04 NOTE — Telephone Encounter (Signed)
 Malignant pleural effusion right side pleurx today. Needs followup Lauraine Lites or [place]ucker or Kara

## 2024-10-04 NOTE — Consult Note (Signed)
 NAME:  Carolyn Wells, MRN:  [id number], DOB:  01/05/1941, LOS: 1 ADMISSION DATE:  10/02/2024, CONSULTATION DATE:  10/03/24 REFERRING MD:  Sigurd Pac of [facility], CHIEF COMPLAINT:  REcurrent malignant right pleural effusion    History of Present Illness: patient , daughter, chart and Dr SHAUNNA Pac    83 year old female admitted for recurrent right-sided malignant pleural effusion secondary to lung cancer  She is 7 with CKD, allergic rhinitis, acid reflux, hypertension, hyperlipidemia osteopenia, paroxysmal atrial fibrillation and solitary left kidney [status post right kidney donation to husband in 2007].  She got symptomatic in October 2025 which resulted in a diagnosis of stage IV, T2b, N3, M1 non-small cell lung cancer adenocarcinoma because of presentation with right upper lobe mass in addition to right hilar, mediastinal and right supraclavicular lymphadenopathy as well as malignant right pleural effusion diagnosed October 2025.Molecular studies by [facility] 1 showed positive KRAS G12C mutation and PD-L1 expression of 40%   She met Dr. Sherrod for the first time 09/26/2024.  She is awaiting Port-A-Cath.  Starting therapy with carboplatin plus paclitaxel plus Keytruda.  She has had 2 thoracentesis was 110/29/25 and second 111/6/25.  Both on the right side were positive for malignant cells.  She is readmitted for orthopnea, also cough when sitting up and exertional short of breath.  She feels it is because of the pleural effusion getting worse.  Symptoms of always improved during thoracentesis but they are recurrent.  Chest x-ray showed moderate right greater than left pleural effusion.  She had an echo 09/22/2024 withoiut pericardial effusion  Noted she is not on anticoagulation at home  [facility] hospitalist wants opinion on next steps in management as per given significant symptoms.   Past Medical History:    has a past medical history of ABDOMINAL INCISIONAL HERNIA (08/13/2009),  Allergic rhinitis, cause unspecified (09/28/2011), BRADYCARDIA (09/26/2010), CKD (chronic kidney disease), stage III (HCC) (02/26/2015), COLONIC POLYPS, HX OF (08/25/2007), Complication of anesthesia, DIVERTICULOSIS, COLON (08/25/2007), Dysrhythmia, GASTROESOPHAGEAL REFLUX DISEASE (08/04/2007), HYPERLIPIDEMIA (09/27/2010), HYPERTENSION (08/06/2007), Hypothyroidism, INTERNAL HEMORRHOIDS (08/04/2007), LOW BACK PAIN (08/06/2007), Lung cancer (HCC), OSTEOPENIA (08/04/2007), PAF (paroxysmal atrial fibrillation) (HCC), PONV (postoperative nausea and vomiting), and Solitary kidney, acquired (09/28/2011).   reports that she has never smoked. She has never been exposed to tobacco smoke. She has never used smokeless tobacco.  Past Surgical History:  Procedure Laterality Date   CARDIOVERSION [name] 08/30/2024   Procedure: CARDIOVERSION;  Surgeon: Francyne Headland, MD;  Location: [facility];  Service: Cardiovascular;  Laterality: [name];   IR THORACENTESIS ASP PLEURAL SPACE W/IMG GUIDE  09/06/2024   IR THORACENTESIS ASP PLEURAL SPACE W/IMG GUIDE  09/14/2024   IR THORACENTESIS ASP PLEURAL SPACE W/IMG GUIDE  09/28/2024   LAMINECTOMY  1999   L4-5/lumbar   NEPHRECTOMY     Right   s/o incisional hernia repair     RUQ   STRABISMUS SURGERY Bilateral 04/14/2019   Procedure: STRABISMUS REPAIR BILATERAL;  Surgeon: Neysa Fallow, MD;  Location: [facility];  Service: Ophthalmology;  Laterality: Bilateral;   TONSILLECTOMY      Allergies  Allergen Reactions   Budesonide Other (See Comments)    Causes nasal bleeding   Elavil  [Amitriptyline ] Other (See Comments)    Feels bad   Gabapentin  Other (See Comments)    Loopy effect   Tramadol  Other (See Comments)    Loopy feeling   Hydrocodone Nausea Only and Other (See Comments)    lethargy   Ipratropium     Causes nasal  bleeding    Immunization History  Administered Date(s) Administered   Fluad Quad(high Dose 65+) 10/09/2020, 10/08/2021   Fluad  Trivalent(High Dose 65+) 08/04/2019, 10/21/2023   INFLUENZA, HIGH DOSE SEASONAL PF 08/27/2017, 09/16/2018   Influenza Split 09/28/2011, 09/28/2012   Influenza Whole 09/10/2006, 08/25/2007, 08/30/2008, 08/13/2009, 09/26/2010   Influenza, Seasonal, Injecte, Preservative Fre 09/01/2013   Influenza,inj,Quad PF,6+ Mos 08/28/2015   PFIZER(Purple Top)SARS-COV-2 Vaccination 01/11/2020, 02/06/2020, 08/07/2020   Pneumococcal Conjugate-13 09/28/2013   Pneumococcal Polysaccharide-23 08/09/2005, 08/10/2005, 03/27/2011   Td 11/09/1989, 08/13/2009   Tdap 09/19/2019   Zoster Recombinant(Shingrix) 09/20/2019, 11/19/2019   Zoster, Live 07/28/2006    Family History  Problem Relation Age of Onset   Coronary artery disease Mother 43       1st degree relative    Diabetes Mother    Heart attack Father 57   Diabetes Other        1st degree relative   Alzheimer's disease Brother      Current Facility-Administered Medications:    acetaminophen  (TYLENOL ) tablet 650 mg, 650 mg, Oral, Q6H PRN **OR** acetaminophen  (TYLENOL ) suppository 650 mg, 650 mg, Rectal, Q6H PRN, Trudy Anthony HERO, MD   bisacodyl  (DULCOLAX) EC tablet 5 mg, 5 mg, Oral, Daily PRN, Trudy Anthony HERO, MD   ceFAZolin  (ANCEF ) IVPB 2g/100 mL premix, 2 g, Intravenous, to XRAY, Huneycutt, Brittany, NP   levothyroxine  (SYNTHROID ) tablet 50 mcg, 50 mcg, Oral, Q0600, Trudy Anthony HERO, MD, 50 mcg at 10/04/24 0546   metoprolol  tartrate (LOPRESSOR ) tablet 75 mg, 75 mg, Oral, Daily, Trudy Anthony HERO, MD, 75 mg at 10/04/24 1150   morphine  (PF) 2 MG/ML injection 1 mg, 1 mg, Intravenous, Q4H PRN, Trudy Anthony HERO, MD   ondansetron  (ZOFRAN ) tablet 4 mg, 4 mg, Oral, Q6H PRN **OR** ondansetron  (ZOFRAN ) injection 4 mg, 4 mg, Intravenous, Q6H PRN, Trudy Anthony HERO, MD   oxyCODONE  (Oxy IR/ROXICODONE ) immediate release tablet 5 mg, 5 mg, Oral, Q4H PRN, Trudy Anthony HERO, MD, 5 mg at 10/04/24 1230   pantoprazole  (PROTONIX ) EC tablet 40 mg, 40 mg,  Oral, Daily, Trudy Anthony M, MD, 40 mg at 10/04/24 1150   sodium chloride  flush (NS) 0.9 % injection 3 mL, 3 mL, Intravenous, Q12H, Trudy Anthony HERO, MD, 3 mL at 10/03/24 2220     Significant Hospital Events:  10/02/2024 - admit 11/25 - pulm consult   Interim History / Subjective:   10/04/2024 - s/p pleurx and now drained 1L. CXR ful expansion Having pleuritic pain post tube. Watns to eat  Objective   Blood pressure (!) 130/112, pulse (!) 45, temperature 98.1 F (36.7 C), temperature source Oral, resp. rate 20, SpO2 97%.        Intake/Output Summary (Last 24 hours) at 10/04/2024 1339 Last data filed at 10/04/2024 0600 Gross per 24 hour  Intake 300 ml  Output 1700 ml  Net -1400 ml   There were no vitals filed for this visit.   Examination: General Appearance:  Looks stable. C/o pain Head:  Normocephalic, without obvious abnormality, atraumatic Eyes:  PERRL - yes, conjunctiva/corneas - muddy     Ears:  Normal external ear canals, both ears Nose:  G tube - no Throat:  ETT TUBE - no , OG tube - no Neck:  Supple,  No enlargement/tenderness/nodules Lungs: Clear to auscultation bilaterally, Heart:  S1 and S2 normal, no murmur, CVP - no.  Pressors - no Abdomen:  Soft, no masses, no organomegaly Genitalia / Rectal:  Not done Extremities:  Extremities- intactg Skin:  ntact in exposed areas . Sacral area - not examined Neurologic:  Sedation - none -> RASS - +1 . Moves all 4s - ye. CAM-ICU - neg . Orientation - x3+     Resolved Hospital Problem list   x  Assessment & Plan:  ASSESSMENT / PLAN:  PULMONARY  A:  Stage IV non-small cell lung cancer diagnosed October 2025 awaiting chemotherapy and Port-A-Cath  Recurrent right malignant pleural effusion that is symptomatic  10/04/2024 -> s/ Pleurx and doing well but for pleuritic pain  P:   Education by Camie Batty - daughter willl manage Control pain - d/w Dr Sigurd Pac Followup Pulmonary clinic Can  eat  CCM will sign off  Best practice (daily eval):  According to the hospitalist      SIGNATURE    Dr. Dorethia Cave, M.D., F.C.C.P,  Pulmonary and Critical Care Medicine Staff Physician, [facility] Center Director - Interstitial Lung Disease  Program  Pulmonary Fibrosis [facility] at [facility] [place], [place], [address]  NPI Number:  NPI #[id number]  Pager: [phone], If no answer  -> Check AMION or Try [phone] Telephone (clinical office): [phone] Telephone (research): [phone]  1:39 PM 10/04/2024   10/04/2024 1:39 PM    LABS    PULMONARY Recent Labs  Lab 10/02/24 1259  TCO2 22    CBC Recent Labs  Lab 10/02/24 1245 10/02/24 1259 10/03/24 1403  HGB 8.6* 8.8* 9.8*  HCT 28.5* 26.0* 31.5*  WBC 6.9  --  7.0  PLT 356  --  399    COAGULATION No results for input(s): INR in the last 168 hours.  CARDIAC  No results for input(s): TROPONINI in the last 168 hours. No results for input(s): PROBNP in the last 168 hours.  CHEMISTRY Recent Labs  Lab 10/02/24 1245 10/02/24 1259 10/03/24 1403  NA 144 141 139  K 4.7 4.7 4.1  CL 106 110 101  CO2 20*  --  24  GLUCOSE 101* 101* 93  BUN 18 18 19   CREATININE 1.70* 1.80* 1.71*  CALCIUM  9.3  --  9.4  MG 1.8  --   --    Estimated Creatinine Clearance: 22.9 mL/min (A) (by C-G formula based on SCr of 1.71 mg/dL (H)).   LIVER Recent Labs  Lab 10/02/24 1245  AST 26  ALT 23  ALKPHOS 119  BILITOT 0.7  PROT 6.2*  ALBUMIN 2.9*     INFECTIOUS No results for input(s): LATICACIDVEN, PROCALCITON in the last 168 hours.   ENDOCRINE CBG (last 3)  No results for input(s): GLUCAP in the last 72 hours.       IMAGING x48h  - image(s) personally visualized  -   highlighted in bold No results found.

## 2024-10-04 NOTE — Progress Notes (Signed)
 PROGRESS NOTE    Carolyn Wells  FMW:[id number] DOB: August 09, 1941 DOA: 10/02/2024 PCP: [name], MD  83/F with history of persistent A-fib on Xarelto , chronic diastolic CHF, recently diagnosed metastatic adeno CA lung with recurrent malignant effusions, saw Dr. Sherrod recently and is due to start chemotherapy soon after port placement.  In the meantime she has had 3 thoracentesis for recurrent malignant effusion, last completed on 11/10. - Presented to the ED with worsening shortness of breath, swelling orthopnea.  Takes Lasix  every other day, in the ED tachypneic, mildly hypoxic placed on 2 L O2, BNP 614, troponin 18, creatinine 1.5, WBC 8, chest x-ray with bilateral effusion worse on the right   Subjective: -Feels short of breath, orthopneic  Assessment and Plan:  Recurrent malignant effusion - Has had numerous thoracentesis on the right - Appreciate pulmonary consult, IR consulted for PleurX catheter placement today - Resume Xarelto  tomorrow if stable  Newly diagnosed stage IV non-small cell lung cancer - Just saw Dr. Gatha last week, due to start chemotherapy soon after port placement next week  Acute on chronic diastolic CHF - Echo with EF 55-60%, normal RV, no significant valvular disease - Continue IV Lasix , increased dose to 40 mg twice daily, continue metoprolol  for persistent A-fib - Switch to oral diuretics tomorrow  Persistent atrial fibrillation -Remains in A-fib, rate controlled, continue metoprolol , holding Xarelto   Hypothyroidism Continue Synthroid   DVT prophylaxis: SCDs Code Status: DNR Family Communication: Discussed with patient detail, no family at bedside  Disposition Plan: Home in 1 to 2 days  Consultants:    Procedures:   Antimicrobials:    Objective: Vitals:   10/04/24 1140 10/04/24 1145 10/04/24 1150 10/04/24 1204  BP: (!) 138/56 (!) 149/70 135/73 125/71  Pulse: (!) 120 (!) 129 (!) 118 (!) 121  Resp: 20 13 19 20   Temp:       TempSrc:      SpO2: 95% 95% 97% 93%    Intake/Output Summary (Last 24 hours) at 10/04/2024 1214 Last data filed at 10/04/2024 0600 Gross per 24 hour  Intake 300 ml  Output 1700 ml  Net -1400 ml   There were no vitals filed for this visit.  Examination:  General exam: Appears calm and comfortable, AO x 3 HEENT: Positive JVD Respiratory system: Decreased breath sounds at the bases Cardiovascular system: S1 & S2 heard, RRR.  Abd: nondistended, soft and nontender.Normal bowel sounds heard. Central nervous system: Alert and oriented. No focal neurological deficits. Extremities: Trace edema Skin: No rashes Psychiatry:  Mood & affect appropriate.     Data Reviewed:   CBC: Recent Labs  Lab 10/02/24 1245 10/02/24 1259 10/03/24 1403  WBC 6.9  --  7.0  NEUTROABS 5.5  --   --   HGB 8.6* 8.8* 9.8*  HCT 28.5* 26.0* 31.5*  MCV 96.0  --  91.0  PLT 356  --  399   Basic Metabolic Panel: Recent Labs  Lab 10/02/24 1245 10/02/24 1259 10/03/24 1403  NA 144 141 139  K 4.7 4.7 4.1  CL 106 110 101  CO2 20*  --  24  GLUCOSE 101* 101* 93  BUN 18 18 19   CREATININE 1.70* 1.80* 1.71*  CALCIUM  9.3  --  9.4  MG 1.8  --   --    GFR: Estimated Creatinine Clearance: 22.9 mL/min (A) (by C-G formula based on SCr of 1.71 mg/dL (H)). Liver Function Tests: Recent Labs  Lab 10/02/24 1245  AST 26  ALT 23  ALKPHOS 119  BILITOT 0.7  PROT 6.2*  ALBUMIN 2.9*   No results for input(s): LIPASE, AMYLASE in the last 168 hours. No results for input(s): AMMONIA in the last 168 hours. Coagulation Profile: No results for input(s): INR, PROTIME in the last 168 hours. Cardiac Enzymes: No results for input(s): CKTOTAL, CKMB, CKMBINDEX, TROPONINI in the last 168 hours. BNP (last 3 results) Recent Labs    08/25/24 1349  PROBNP 695.0*   HbA1C: No results for input(s): HGBA1C in the last 72 hours. CBG: No results for input(s): GLUCAP in the last 168 hours. Lipid  Profile: No results for input(s): CHOL, HDL, LDLCALC, TRIG, CHOLHDL, LDLDIRECT in the last 72 hours. Thyroid  Function Tests: No results for input(s): TSH, T4TOTAL, FREET4, T3FREE, THYROIDAB in the last 72 hours. Anemia Panel: No results for input(s): VITAMINB12, FOLATE, FERRITIN, TIBC, IRON, RETICCTPCT in the last 72 hours. Urine analysis:    Component Value Date/Time   COLORURINE YELLOW 11/18/2023 1340   APPEARANCEUR CLEAR 11/18/2023 1340   LABSPEC 1.025 11/18/2023 1340   PHURINE 5.5 11/18/2023 1340   GLUCOSEU NEGATIVE 11/18/2023 1340   HGBUR NEGATIVE 11/18/2023 1340   BILIRUBINUR NEGATIVE 11/18/2023 1340   KETONESUR TRACE (A) 11/18/2023 1340   UROBILINOGEN 0.2 11/18/2023 1340   NITRITE NEGATIVE 11/18/2023 1340   LEUKOCYTESUR NEGATIVE 11/18/2023 1340   Sepsis Labs: @LABRCNTIP (procalcitonin:4,lacticidven:4)  )No results found for this or any previous visit (from the past 240 hours).   Radiology Studies: DG Chest Portable 1 View Result Date: 10/02/2024 CLINICAL DATA:  Shortness of breath. EXAM: PORTABLE CHEST 1 VIEW COMPARISON:  Chest radiograph dated 09/28/2024. FINDINGS: Bilateral pleural effusions, right greater than left, and increased since the prior radiograph. There is bibasilar atelectasis or pneumonia. No pneumothorax. Stable cardiac silhouette. No acute osseous pathology. IMPRESSION: Bilateral pleural effusions, right greater than left, and increased since the prior radiograph. Electronically Signed   By: Vanetta Chou M.D.   On: 10/02/2024 13:14     Scheduled Meds:  furosemide   40 mg Intravenous BID   levothyroxine   50 mcg Oral Q0600   metoprolol  tartrate  75 mg Oral Daily   pantoprazole   40 mg Oral Daily   sodium chloride  flush  3 mL Intravenous Q12H   Continuous Infusions:   ceFAZolin  (ANCEF ) IV       LOS: 1 day    Time spent:    Sigurd Pac, MD [facility]   10/04/2024, 12:14 PM

## 2024-10-04 NOTE — Consult Note (Addendum)
 Chief Complaint: Recurrent right pleural effusion  Referring Provider(s): Dr. Geronimo (pulmonology)  Supervising Physician: Philip Cornet  Patient Status: [facility] - In-pt  History of Present Illness: Carolyn Wells is a 83 y.o. female history significant for A-fib (Xarelto ), CHF, metastatic lung cancer with recurrent right pleural effusions. Patient presented to the ER 10/02/24 for shortness of breath and was admitted.  Pulmonology has been consulted as part of admission and recommends PleurX catheter placement. IR consulted.   She is known to IR from previous thoracenteses, most recently 09/28/2024 (750 mL), 09/14/2024 (950 mL).  She underwent chest x-ray during current admission with evidence of fluid reaccumulation adequate for PleurX drain placement.  Typically, patients who receive PleurX drain with IR during have hospice or home health care established prior to placement.  However, Dr. Geronimo has communicated directly with IR.  Per this conversation the patient has been accepted by his group, and pulmonology agrees to provide support with orders (frequency and volume that patient is to remove at home) and troubleshooting OP.  He has also placed an order for home health DME support.  The patient will be provided with a PleurX welcome box as part of placement that will tentatively occur today.  Spoke to patient and patient's daughter (who will be caring for drain at home for patient).  We discussed the procedure and risks associated.  She is aware that IR will not be providing ongoing support for drain management beyond concerns for malposition, malfunction, etc. Stressed importance of infection management, particularly calling IR if cuff of the drain becomes exposed.  Also encouraged daughter to plan to empty the drain for the first time independently in the morning during business hours so that resources are more available from pulmonology.  Confirms NPO since MN.   Allergies  Reviewed:  Budesonide, Elavil  [amitriptyline ], Gabapentin , Tramadol , Hydrocodone, and Ipratropium   Patient is Full Code * for procedure only, DNR not suspended outside of procedure area.   Past Medical History:  Diagnosis Date   ABDOMINAL INCISIONAL HERNIA 08/13/2009   s/p repair   Allergic rhinitis, cause unspecified 09/28/2011   BRADYCARDIA 09/26/2010   CKD (chronic kidney disease), stage III (HCC) 02/26/2015   Solitary kidney   COLONIC POLYPS, HX OF 08/25/2007   Complication of anesthesia    DIVERTICULOSIS, COLON 08/25/2007   Dysrhythmia    GASTROESOPHAGEAL REFLUX DISEASE 08/04/2007   HYPERLIPIDEMIA 09/27/2010   not on Rx therapy   HYPERTENSION 08/06/2007   Hypothyroidism    INTERNAL HEMORRHOIDS 08/04/2007   LOW BACK PAIN 08/06/2007   scoliosis; DJD; gets injections   Lung cancer (HCC)    OSTEOPENIA 08/04/2007   PAF (paroxysmal atrial fibrillation) (HCC)    a.  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    Past Surgical History:  Procedure Laterality Date   CARDIOVERSION [name] 08/30/2024   Procedure: CARDIOVERSION;  Surgeon: Francyne Headland, MD;  Location: [facility];  Service: Cardiovascular;  Laterality: [name];   IR THORACENTESIS ASP PLEURAL SPACE W/IMG GUIDE  09/06/2024   IR THORACENTESIS ASP PLEURAL SPACE W/IMG GUIDE  09/14/2024   IR THORACENTESIS ASP PLEURAL SPACE W/IMG GUIDE  09/28/2024   LAMINECTOMY  1999   L4-5/lumbar   NEPHRECTOMY     Right   s/o incisional hernia repair  RUQ   STRABISMUS SURGERY Bilateral 04/14/2019   Procedure: STRABISMUS REPAIR BILATERAL;  Surgeon: Neysa Fallow, MD;  Location: [facility];  Service: Ophthalmology;  Laterality: Bilateral;   TONSILLECTOMY        Medications: Prior to Admission medications   Medication Sig Start Date  End Date Taking? Authorizing Provider  acetaminophen  (TYLENOL ) 500 MG tablet Take 500 mg by mouth daily as needed for moderate pain (pain score 4-6) or mild pain (pain score 1-3).   Yes [provider]  ALPRAZolam  (XANAX ) 1 MG tablet Take 1 mg by mouth at bedtime.   Yes [provider]  amLODipine  (NORVASC ) 2.5 MG tablet TAKE 1 TABLET BY MOUTH IN THE MORNING AND AT BEDTIME 09/04/24  Yes Segal, Jared E, DO  Ascorbic Acid (VITAMIN C) 1000 MG tablet Take 1,000 mg by mouth daily.   Yes [provider]  furosemide  (LASIX ) 20 MG tablet Take 1 tablet (20 mg total) by mouth every other day. 09/14/24  Yes Segal, Jared E, DO  levothyroxine  (SYNTHROID ) 50 MCG tablet TAKE 1 TABLET BY MOUTH ONCE DAILY BEFORE BREAKFAST APPOINTMENT  REQUIRED  FOR  FUTURE  REFILLS 09/04/24  Yes [name], MD  LUMIGAN 0.01 % SOLN Place 1 drop into both eyes at bedtime.  08/08/15  Yes [provider]  metoprolol  tartrate (LOPRESSOR ) 25 MG tablet Take 3 tablets (75 mg total) by mouth daily. 02/24/24  Yes [name], MD  Multiple Vitamins-Minerals (PRESERVISION AREDS 2 PO) Take 1 tablet by mouth daily.   Yes [provider]  Multiple Vitamins-Minerals (WOMENS 50+ MULTI VITAMIN/MIN PO) Take 1 tablet by mouth daily.   Yes [provider]  Omega-3 Fatty Acids (FISH OIL TRIPLE STRENGTH) 1400 MG CAPS Take 1,400 mg by mouth daily.   Yes [provider]  pantoprazole  (PROTONIX ) 40 MG tablet Take 1 tablet (40 mg total) by mouth daily. TAKE 1 TABLET BY MOUTH ONCE DAILY Patient taking differently: Take 40 mg by mouth daily. 05/08/24  Yes [name], MD  Perfluorohexyloctane (MIEBO) 1.338 GM/ML SOLN Place 1.338 mLs into both eyes 4 (four) times daily.   Yes [provider]  potassium chloride  (KLOR-CON ) 10 MEQ tablet Take 10 mEq by mouth every other day. 09/14/24  Yes [provider]  Rivaroxaban  (XARELTO ) 15 MG TABS tablet TAKE 1 TABLET BY MOUTH ONCE DAILY WITH  SUPPER 05/08/24  Yes Nahser, Aleene PARAS, MD  vitamin k 100 MCG tablet Take 100 mcg by mouth daily.   Yes [provider]  lidocaine -prilocaine  (EMLA ) cream Apply to affected area once Patient not taking: Reported on 10/02/2024 09/26/24   Sherrod Sherrod, MD  ondansetron  (ZOFRAN ) 8 MG tablet Take 1 tablet (8 mg total) by mouth every 8 (eight) hours as needed for nausea or vomiting. Start on the third day after carboplatin. Patient not taking: Reported on 10/02/2024 09/26/24   Sherrod Sherrod, MD  prochlorperazine  (COMPAZINE ) 10 MG tablet Take 1 tablet (10 mg total) by mouth every 6 (six) hours as needed for nausea or vomiting. Patient not taking: Reported on 10/02/2024 09/26/24   Sherrod Sherrod, MD     Family History  Problem Relation Age of Onset   Coronary artery disease Mother 9       1st degree relative    Diabetes Mother    Heart attack Father 66   Diabetes Other        1st degree relative   Alzheimer's disease Brother     Social History  Socioeconomic History   Marital status: Married    Spouse name: Not on file   Number of children: 2   Years of education: Not on file   Highest education level: Not on file  Occupational History   Occupation: retired Passenger transport manager co.    Employer: RETIRED  Tobacco Use   Smoking status: Never    Passive exposure: Never   Smokeless tobacco: Never  Substance and Sexual Activity   Alcohol use: No    Alcohol/week: 0.0 standard drinks of alcohol   Drug use: No   Sexual activity: Not on file  Other Topics Concern   Not on file  Social History Narrative   Husband disabled, renal failure, DM, HTN.   Originally from [place], [place] >> came to [place]  1966 (husband in Electronics Engineer)   Social Drivers of Corporate Investment Banker Strain: Low Risk  (03/22/2024)   Received from [facility]   Overall Financial Resource Strain (CARDIA)    Difficulty of Paying Living Expenses: Not hard at all  Food Insecurity: No Food Insecurity  (10/03/2024)   Hunger Vital Sign    Worried About Running Out of Food in the Last Year: Never true    Ran Out of Food in the Last Year: Never true  Transportation Needs: No Transportation Needs (10/03/2024)   PRAPARE - Administrator, Civil Service (Medical): No    Lack of Transportation (Non-Medical): No  Physical Activity: Sufficiently Active (02/09/2023)   Exercise Vital Sign    Days of Exercise per Week: 5 days    Minutes of Exercise per Session: 30 min  Stress: No Stress Concern Present (02/09/2023)   [employer] of Occupational Health - Occupational Stress Questionnaire    Feeling of Stress : Not at all  Social Connections: Moderately Integrated (10/03/2024)   Social Connection and Isolation Panel    Frequency of Communication with Friends and Family: Three times a week    Frequency of Social Gatherings with Friends and Family: Three times a week    Attends Religious Services: More than 4 times per year    Active Member of Clubs or Organizations: No    Attends Banker Meetings: More than 4 times per year    Marital Status: Widowed     Review of Systems: A 12 point ROS discussed and pertinent positives are indicated in the HPI above.  All other systems are negative.  Review of Systems  Constitutional:  Negative for chills and fever.  Respiratory:  Negative for shortness of breath.   Cardiovascular:  Positive for leg swelling. Negative for chest pain.  Gastrointestinal:  Negative for nausea and vomiting.  Genitourinary:  Negative for hematuria.    Vital Signs: BP (!) 141/82 (BP Location: Left Arm)   Pulse 75   Temp 98.8 F (37.1 C) (Oral)   Resp 16   SpO2 91%   Advance Care Plan: No documents on file   Physical Exam HENT:     Mouth/Throat:     Mouth: Mucous membranes are moist.     Pharynx: Oropharynx is clear.  Cardiovascular:     Rate and Rhythm: Normal rate. Rhythm irregular.     Pulses: Normal pulses.  Pulmonary:     Effort:  Pulmonary effort is normal.     Comments: R diminished Abdominal:     General: There is no distension.     Palpations: Abdomen is soft.     Tenderness: There is no abdominal tenderness.  Skin:  General: Skin is warm and dry.  Neurological:     Mental Status: She is alert and oriented to person, place, and time.  Psychiatric:        Mood and Affect: Mood normal.        Behavior: Behavior normal.     Imaging: DG Chest Portable 1 View Result Date: 10/02/2024 CLINICAL DATA:  Shortness of breath. EXAM: PORTABLE CHEST 1 VIEW COMPARISON:  Chest radiograph dated 09/28/2024. FINDINGS: Bilateral pleural effusions, right greater than left, and increased since the prior radiograph. There is bibasilar atelectasis or pneumonia. No pneumothorax. Stable cardiac silhouette. No acute osseous pathology. IMPRESSION: Bilateral pleural effusions, right greater than left, and increased since the prior radiograph. Electronically Signed   By: Vanetta Chou M.D.   On: 10/02/2024 13:14   IR THORACENTESIS ASP PLEURAL SPACE W/IMG GUIDE Result Date: 09/28/2024 INDICATION: History of right lung cancer with recurrent right pleural effusion. Request for therapeutic right thoracentesis. EXAM: ULTRASOUND GUIDED THERAPEUTIC RIGHT THORACENTESIS MEDICATIONS: 7 mL 1% lidocaine  COMPLICATIONS: None immediate. PROCEDURE: An ultrasound guided thoracentesis was thoroughly discussed with the patient and questions answered. The benefits, risks, alternatives and complications were also discussed. The patient understands and wishes to proceed with the procedure. Written consent was obtained. Ultrasound was performed to localize and mark an adequate pocket of fluid in the right chest. The area was then prepped and draped in the normal sterile fashion. 1% Lidocaine  was used for local anesthesia. Under ultrasound guidance a 6 Fr Safe-T-Centesis catheter was introduced. Thoracentesis was performed. The catheter was removed and a dressing  applied. FINDINGS: A total of approximately 750 mL of dark yellow fluid was removed. IMPRESSION: Successful ultrasound guided right thoracentesis yielding 750 mL of pleural fluid. Performed and dictated by Kimble Clas, PA-C Electronically Signed   By: Marcey Moan M.D.   On: 09/28/2024 14:21   DG Chest 1 View Result Date: 09/28/2024 CLINICAL DATA:  Follow-up pleural effusion. Known right upper lobe lung cancer. EXAM: CHEST  1 VIEW COMPARISON:  Chest x-ray 09/25/2024, PET-CT 09/19/2024 FINDINGS: Lungs are somewhat hypoinflated demonstrate evidence of small bilateral pleural effusions likely with associated bibasilar atelectasis without significant change. Stable known right lung cancer over the right upper lobe/right paramediastinal region. Cardiac silhouette is normal. Remainder of the exam is unchanged. IMPRESSION: 1. Stable small bilateral pleural effusions with associated bibasilar atelectasis. 2. Stable known right upper lobe lung cancer. Electronically Signed   By: Toribio Agreste M.D.   On: 09/28/2024 14:06   DG Chest 2 View Result Date: 09/25/2024 EXAM: 2 VIEW(S) XRAY OF THE CHEST 09/25/2024 12:37:00 PM COMPARISON: Paracin colon 09/20/2024. CLINICAL HISTORY: chest pain and shortness of breath with Hx of pleural effusions and lung cancer FINDINGS: LUNGS AND PLEURA: Stable bilateral pleural effusions. Bibasilar airspace opacities primarily representing atelectasis; a component of pneumonia cannot be excluded. Right upper lobe paramediastinal mass noted unchanged. Low lung volumes are present, causing crowding of the pulmonary vasculature. No pneumothorax. HEART AND MEDIASTINUM: No acute abnormality of the cardiac and mediastinal silhouettes. BONES AND SOFT TISSUES: No acute osseous abnormality. IMPRESSION: 1. Stable bilateral pleural effusions and bibasilar airspace opacities, primarily representing atelectasis. Pneumonia cannot be excluded. 2. Unchanged right upper lobe paramediastinal mass. 3. Low  lung volumes causing crowding of the pulmonary vasculature. Electronically signed by: Ryan Salvage MD 09/25/2024 01:42 PM EST RP Workstation: [id number]   ECHOCARDIOGRAM COMPLETE Result Date: 09/22/2024    ECHOCARDIOGRAM REPORT   Patient Name:   GWENETTA DEVOS Date of Exam: 09/22/2024 Medical Rec #:  [id number]         Height:       66.0 in Accession #:    [id number]        Weight:       143.6 lb Date of Birth:  06-05-1941         BSA:          1.737 m Patient Age:    83 years          BP:           132/80 mmHg Patient Gender: F                 HR:           80 bpm. Exam Location:  [address] Procedure: 2D Echo, Cardiac Doppler and Color Doppler (Both Spectral and Color            Flow Doppler were utilized during procedure). Indications:    I48.91 Atrial fibrillation  History:        Patient has prior history of Echocardiogram examinations, most                 recent 01/07/2023. Pleural effusion, Lung cancer,                 Arrythmias:Atrial Fibrillation, Signs/Symptoms:Shortness of                 Breath; Risk Factors:Hypertension and Dyslipidemia.  Sonographer:    Elsie Bohr RDCS Referring Phys: [id number] JARED E SEGAL IMPRESSIONS  1. Left ventricular ejection fraction, by estimation, is 55 to 60%. The left ventricle has normal function. The left ventricle has no regional wall motion abnormalities. There is mild concentric left ventricular hypertrophy. Left ventricular diastolic function could not be evaluated.  2. Right ventricular systolic function is normal. The right ventricular size is normal.  3. Left atrial size was mildly dilated.  4. Right atrial size was mildly dilated.  5. Moderate pleural effusion in the left lateral region.  6. The mitral valve is normal in structure. Trivial mitral valve regurgitation. No evidence of mitral stenosis.  7. The aortic valve is tricuspid. Aortic valve regurgitation is mild. Aortic valve sclerosis is present, with no evidence of aortic valve stenosis.   8. The inferior vena cava is normal in size with greater than 50% respiratory variability, suggesting right atrial pressure of 3 mmHg. FINDINGS  Left Ventricle: Left ventricular ejection fraction, by estimation, is 55 to 60%. The left ventricle has normal function. The left ventricle has no regional wall motion abnormalities. The left ventricular internal cavity size was normal in size. There is  mild concentric left ventricular hypertrophy. Left ventricular diastolic function could not be evaluated due to atrial fibrillation. Left ventricular diastolic function could not be evaluated. Right Ventricle: The right ventricular size is normal. Right ventricular systolic function is normal. Left Atrium: Left atrial size was mildly dilated. Right Atrium: Right atrial size was mildly dilated. Pericardium: Trivial pericardial effusion is present. Mitral Valve: The mitral valve is normal in structure. Trivial mitral valve regurgitation. No evidence of mitral valve stenosis. Tricuspid Valve: The tricuspid valve is normal in structure. Tricuspid valve regurgitation is trivial. No evidence of tricuspid stenosis. Aortic Valve: The aortic valve is tricuspid. Aortic valve regurgitation is mild. Aortic regurgitation PHT measures 571 msec. Aortic valve sclerosis is present, with no evidence of aortic valve stenosis. Pulmonic Valve: The pulmonic valve was normal in structure. Pulmonic valve regurgitation is trivial. No evidence of pulmonic stenosis.  Aorta: The aortic root is normal in size and structure. Venous: The inferior vena cava is normal in size with greater than 50% respiratory variability, suggesting right atrial pressure of 3 mmHg. IAS/Shunts: No atrial level shunt detected by color flow Doppler. Additional Comments: There is a moderate pleural effusion in the left lateral region.  LEFT VENTRICLE PLAX 2D LVIDd:         4.30 cm LVIDs:         2.90 cm LV PW:         1.20 cm LV IVS:        1.20 cm LVOT diam:     1.90 cm LV SV:          48 LV SV Index:   28 LVOT Area:     2.84 cm  IVC IVC diam: 1.90 cm LEFT ATRIUM             Index        RIGHT ATRIUM           Index LA diam:        4.40 cm 2.53 cm/m   RA Area:     13.90 cm LA Vol (A2C):   46.9 ml 27.00 ml/m  RA Volume:   38.60 ml  22.22 ml/m LA Vol (A4C):   46.5 ml 26.77 ml/m LA Biplane Vol: 47.3 ml 27.23 ml/m  AORTIC VALVE LVOT Vmax:   84.60 cm/s LVOT Vmean:  54.720 cm/s LVOT VTI:    0.171 m AI PHT:      571 msec  AORTA Ao Root diam: 3.10 cm Ao Asc diam:  3.50 cm MITRAL VALVE                TRICUSPID VALVE MV Area (PHT): 4.16 cm     TR Peak grad:   32.9 mmHg MV Decel Time: 182 msec     TR Vmax:        287.00 cm/s MV E velocity: 114.40 cm/s                             SHUNTS                             Systemic VTI:  0.17 m                             Systemic Diam: 1.90 cm Redell Shallow MD Electronically signed by Redell Shallow MD Signature Date/Time: 09/22/2024/8:57:10 AM    Final    DG Chest 2 View Result Date: 09/20/2024 EXAM: 2 VIEW(S) XRAY OF THE CHEST 09/20/2024 10:58:24 AM COMPARISON: 09/17/2024 CLINICAL HISTORY: pleural effusion FINDINGS: LUNGS AND PLEURA: Similarly appearing, right upper lobe mass abutting the mediastinum. Small left pleural effusion. Streaky opacities in both lung bases, likely atelectasis. No pulmonary edema. No pneumothorax. HEART AND MEDIASTINUM: The right upper lobe mass abuts the mediastinum. No acute abnormality of the cardiac silhouette. BONES AND SOFT TISSUES: No acute osseous abnormality. IMPRESSION: 1. Streaky bibasilar airspace opacities, likely atelectasis. Trace left pleural effusion. 2. Right upper lobe mass abutting the mediastinum, similar to prior exam. Electronically signed by: Rogelia Myers MD 09/20/2024 11:31 AM EST RP Workstation: [id number]   NM PET Image Initial (PI) Skull Base To Thigh Result Date: 09/20/2024 CLINICAL DATA:  Initial treatment strategy for non-small cell lung cancer. EXAM: NUCLEAR MEDICINE PET  SKULL BASE  TO THIGH TECHNIQUE: 7.1 mCi F-18 FDG was injected intravenously. Full-ring PET imaging was performed from the skull base to thigh after the radiotracer. CT data was obtained and used for attenuation correction and anatomic localization. Fasting blood glucose: 89 mg/dl COMPARISON:  CT chest 89/71/7974. FINDINGS: Mediastinal blood pool activity: SUV max 2.8 Liver activity: SUV max NA NECK: No abnormal hypermetabolism. Incidental CT findings: None. CHEST: Hypermetabolic supraclavicular, mediastinal, left hilar, right axillary, right internal mammary and prepericardiac lymph nodes. Index AP window lymph node measures 1.4 cm (4/68), SUV max 8.4. Medial right upper lobe mass has a long border of contact with the adjacent mediastinum and measures 3.0 x 4.4 cm with SUV max 12.1. Hypermetabolic nodular consolidation in the anterolateral left lower lobe measures approximately 10 mm (7/59), SUV max 3.1. No additional abnormal hypermetabolism. Incidental CT findings: Atherosclerotic calcification of the aorta, aortic valve and coronary arteries. Heart is enlarged. No pericardial effusion. Moderate right and small left pleural effusions. Volume loss in the lateral segment right middle lobe and inferior lingula. ABDOMEN/PELVIS: No abnormal hypermetabolism. Incidental CT findings: Right kidney is absent. Low-attenuation lesion in the left kidney. No specific follow-up necessary. SKELETON: No abnormal hypermetabolism. Incidental CT findings: Degenerative changes in the spine. IMPRESSION: 1. Hypermetabolic right upper lobe mass with hypermetabolic adenopathy extending to the left supraclavicular region, findings compatible with at least T2bN3M0 or stage IIIB primary bronchogenic carcinoma. Hypermetabolic nodular consolidation in the anterior left lower lobe may be infectious/inflammatory in etiology. Metastatic disease or stage IVA disease cannot be definitively excluded. 2. Moderate right and small left pleural effusions. 3. Aortic  atherosclerosis (ICD10-I70.0). Coronary artery calcification. Electronically Signed   By: Newell Eke M.D.   On: 09/20/2024 11:10   MR BRAIN W WO CONTRAST Result Date: 09/20/2024 CLINICAL DATA:  Non-small cell lung cancer.  Staging. EXAM: MRI HEAD WITHOUT AND WITH CONTRAST TECHNIQUE: Multiplanar, multiecho pulse sequences of the brain and surrounding structures were obtained without and with intravenous contrast. CONTRAST:  6.5mL GADAVIST  GADOBUTROL  1 MMOL/ML IV SOLN COMPARISON:  Head CT 06/27/2023 FINDINGS: Brain: No focal finding affects the brainstem or cerebellum. Mild cerebellar volume loss, age related. Cerebral hemispheres show age related volume loss without subjective lobar predominance. There is a background pattern of mild chronic small-vessel ischemic change of the deep white matter. There are 3 subcentimeter foci of restricted diffusion in the right frontal lobe. Two of these are punctate and the larger focus in the medial inferior frontal lobe measures up to 8 mm. There is no edema or enhancement associated with these foci and they are favored to represent small embolic strokes. Nonenhancing metastases are possible but less likely. The clustered nature favors vascular pathology. No evidence of any other brain metastasis. There is an incidental meningioma measuring 1.8 cm in diameter at the frontoparietal vertex on the left. Minimal indentation of the surface of the brain but no significant mass effect. Broad surface along the superior sagittal sinus but without evidence of sinus invasion or occlusion. No hydrocephalus or extra-axial collection. Vascular: Major vessels at the base of the brain show flow. Skull and upper cervical spine: Negative Sinuses/Orbits: Clear/normal Other: None IMPRESSION: 1. 3 subcentimeter foci of restricted diffusion in the right frontal lobe. Two of these are punctate and the larger focus in the medial inferior frontal lobe measures up to 8 mm. There is no edema or  enhancement associated with these foci and they are favored to represent recent small embolic strokes. Nonenhancing metastases are possible but less  likely. The clustered nature favors vascular pathology. 2. No evidence of any other brain metastasis. 3. 1.8 cm meningioma at the frontoparietal vertex on the left. Minimal indentation of the surface of the brain but no significant mass effect. Broad surface along the superior sagittal sinus but without evidence of sinus invasion or occlusion. No apparent change compared to the CT scan August 2024. Electronically Signed   By: Oneil Officer M.D.   On: 09/20/2024 11:00   DG Chest 1 View Result Date: 09/18/2024 CLINICAL DATA:  Status post right thoracentesis EXAM: CHEST  1 VIEW COMPARISON:  Chest x-ray performed September 06, 2024 FINDINGS: No pneumothorax. Improvement in aeration of the right lung with trace residual pleural fluid. Heart size is unchanged. Right upper lobe lesion is unchanged. Degenerative changes in the imaged osseous structures. IMPRESSION: 1. Improvement in aeration of the right lung without pneumothorax. 2. Stable appearance of right lung lesion. Electronically Signed   By: Maude Naegeli M.D.   On: 09/18/2024 07:42   DG Chest 2 View Result Date: 09/17/2024 EXAM: 2 VIEW(S) XRAY OF THE CHEST 09/17/2024 11:59:09 AM COMPARISON: 09/14/2024 CLINICAL HISTORY: sob, cp FINDINGS: LUNGS AND PLEURA: Lateral view degraded by patient arm position, not raised above the head. Hyperinflation. Increased right and persistent left base airspace disease. Slight increase in small right pleural effusion. Trace left pleural fluid. No pulmonary edema. No pneumothorax. HEART AND MEDIASTINUM: Mild cardiomegaly. Right paratracheal mass has been detailed previously. BONES AND SOFT TISSUES: Skin fold over the superolateral right hemithorax. No acute osseous abnormality. IMPRESSION: 1. increase in small right and persistent small left pleural effusions, without pneumothorax. 2.  Increased right and similar left base airspace disease, likely atelectasis. Electronically signed by: Rockey Kilts MD 09/17/2024 12:33 PM EST RP Workstation: [id number]   IR THORACENTESIS ASP PLEURAL SPACE W/IMG GUIDE Result Date: 09/14/2024 INDICATION: Patient with right upper lobe lung mass and recurrent right pleural effusion. Consult for diagnostic and therapeutic right thoracentesis. Her most recent thoracentesis was 09/06/24 (right, 1 liter output). EXAM: ULTRASOUND GUIDED RIGHT THORACENTESIS MEDICATIONS: 8 mL 1% lidocaine  with epi COMPLICATIONS: None immediate. PROCEDURE: An ultrasound guided thoracentesis was thoroughly discussed with the patient and questions answered. The benefits, risks, alternatives and complications were also discussed. The patient understands and wishes to proceed with the procedure. Written consent was obtained. Ultrasound was performed to localize and mark an adequate pocket of fluid in the right chest. The area was then prepped and draped in the normal sterile fashion. 1% Lidocaine  was used for local anesthesia. Under ultrasound guidance a 6 Fr Safe-T-Centesis catheter was introduced. Thoracentesis was performed. The catheter was removed and a dressing applied. FINDINGS: A total of approximately 950 milliliters of clear, yellow fluid was removed. Samples were sent to the laboratory as requested by the clinical team. IMPRESSION: Successful ultrasound guided right thoracentesis yielding 950 milliliters of pleural fluid. Performed by Laymon Coast, NP under the supervision of Dr. Jennefer. Electronically Signed   By: Ester Jennefer M.D.   On: 09/14/2024 13:27   DG Chest 2 View Result Date: 09/12/2024 CLINICAL DATA:  Evaluate pleural effusion. EXAM: CHEST - 2 VIEW COMPARISON:  Chest x-ray 09/06/2024, chest CT 09/05/2024 FINDINGS: Lungs are adequately inflated demonstrate moderate size right pleural effusion with reaccumulation since the previous exam. Small left effusion. Likely  associated bibasilar atelectatic change. Known right upper paramediastinal lung mass unchanged. Cardiomediastinal silhouette and remainder of the exam is unchanged. IMPRESSION: 1. Moderate size right pleural effusion with reaccumulation since the previous exam. Small left  effusion. Likely associated bibasilar atelectatic change. 2. Known right upper paramediastinal lung mass unchanged. Electronically Signed   By: Toribio Agreste M.D.   On: 09/12/2024 11:52   IR THORACENTESIS ASP PLEURAL SPACE W/IMG GUIDE Result Date: 09/06/2024 INDICATION: Patient with right upper lobe lung mass, right pleural effusion. Consult for diagnostic and therapeutic right thoracentesis. EXAM: ULTRASOUND GUIDED DIAGNOSTIC AND THERAPEUTIC RIGHT THORACENTESIS MEDICATIONS: 8 mL 1% lidocaine  COMPLICATIONS: None immediate. PROCEDURE: An ultrasound guided thoracentesis was thoroughly discussed with the patient and questions answered. The benefits, risks, alternatives and complications were also discussed. The patient understands and wishes to proceed with the procedure. Written consent was obtained. Ultrasound was performed to localize and mark an adequate pocket of fluid in the right chest. The area was then prepped and draped in the normal sterile fashion. 1% Lidocaine  was used for local anesthesia. Under ultrasound guidance a 6 Fr Safe-T-Centesis catheter was introduced. Thoracentesis was performed. The catheter was removed and a dressing applied. FINDINGS: A total of approximately 1 liter of slightly hazy yellow fluid was removed. IMPRESSION: Successful ultrasound guided right thoracentesis yielding 1 liter of pleural fluid. Performed and dictated by Kimble Clas, PA-C Electronically Signed   By: CHRISTELLA.  Shick M.D.   On: 09/06/2024 15:33   DG Chest 1 View Result Date: 09/06/2024 EXAM: 1 VIEW XRAY OF THE CHEST 09/06/2024 02:50:00 PM COMPARISON: Comparison same date. CLINICAL HISTORY: [id number] Status post thoracentesis [id number]. Reason for exam:  S/P right thoracentesis, 1 liter removed from the right side. FINDINGS: LUNGS AND PLEURA: No focal pulmonary opacity. No pulmonary edema. Right pleural effusion is significantly smaller. Status post right-sided thoracentesis. No definite pneumothorax. HEART AND MEDIASTINUM: No acute abnormality of the cardiac and mediastinal silhouettes. BONES AND SOFT TISSUES: No acute osseous abnormality. IMPRESSION: 1. No pneumothorax detected. 2. Marked interval decrease in right pleural effusion size. Electronically signed by: Lynwood Seip MD 09/06/2024 03:29 PM EDT RP Workstation: [id number]   CT Chest Wo Contrast Result Date: 09/06/2024 CLINICAL DATA:  Evaluate abnormal chest x-ray. EXAM: CT CHEST WITHOUT CONTRAST TECHNIQUE: Multidetector CT imaging of the chest was performed following the standard protocol without IV contrast. RADIATION DOSE REDUCTION: This exam was performed according to the departmental dose-optimization program which includes automated exposure control, adjustment of the mA and/or kV according to patient size and/or use of iterative reconstruction technique. COMPARISON:  Chest x-ray 08/25/2024 FINDINGS: Cardiovascular: The heart is normal in size. No pericardial effusion. The aorta is within normal limits in caliber. Scattered atherosclerotic calcifications. Scattered three-vessel coronary artery calcifications. Mediastinum/Nodes: Mediastinal and right hilar adenopathy. Right paratracheal/precarinal adenopathy with maximum measurement of 18 mm. Right hilar node measures 15 mm. There is also an enlarged right supraclavicular node measuring 7 mm on image 19/2. Right axillary adenopathy measuring a maximum of 15 mm. The esophagus is grossly normal. Lungs/Pleura: There is a large medial right upper lobe lung mass. This measures approximately 4.5 x 3.5 x 3.0 cm. I do not see any definite metastatic pulmonary nodules. There is a large right pleural effusion which certainly could be malignant and  thoracentesis may be held a diagnosis. Significant right middle lobe atelectasis without obvious endobronchial lesion. Lingular atelectasis is also noted. There is a small left pleural effusion. Upper Abdomen: No significant upper abdominal findings. No hepatic or adrenal gland lesions to suggest metastatic disease. No upper abdominal adenopathy. Aortic calcifications are noted. Musculoskeletal: No breast masses are identified. No bone lesions to suggest metastatic disease. IMPRESSION: 1. 4.5 x 3.5 x 3.0 cm medial right upper  lobe lung mass consistent with primary lung neoplasm. Recommend PET-CT for further evaluation and staging and referral to multi disciplinary thoracic Oncology clinic. 2. Mediastinal, right hilar, right supraclavicular and right axillary adenopathy consistent with metastatic disease. 3. Large right pleural effusion which certainly could be malignant and thoracentesis may be yield a diagnosis. 4. Significant right middle lobe atelectasis without obvious endobronchial lesion. 5. Small left pleural effusion. 6. No findings for upper abdominal metastatic disease. 7. Aortic atherosclerosis. Aortic Atherosclerosis (ICD10-I70.0). Electronically Signed   By: MYRTIS Stammer M.D.   On: 09/06/2024 12:51   DG Chest 2 View Result Date: 09/06/2024 EXAM: 2 VIEW(S) XRAY OF THE CHEST 09/06/2024 10:51:00 AM COMPARISON: 08/25/2024 CLINICAL HISTORY: sob. Per triage notes: Pt c/o chest tightness and shortness of breath for awhile. Pt has labored breathing, able to speak a few words at a time, states the pain feels like a tightness around her chest. Pt states tightness and shortness of breath have been ; getting worse.  FINDINGS: LUNGS AND PLEURA: Persistent right suprahilar mass. Patchy airspace opacities in left lung base. Increased small right pleural effusion. No pulmonary edema. No pneumothorax. HEART AND MEDIASTINUM: No acute abnormality of the cardiac and mediastinal silhouettes. BONES AND SOFT TISSUES:  No acute osseous abnormality. IMPRESSION: 1. Persistent right suprahilar mass. 2. Increased small right pleural effusion. 3. Patchy airspace opacities in left lung base. Electronically signed by: Evalene Coho MD 09/06/2024 12:11 PM EDT RP Workstation: [id number]    Labs:  CBC: Recent Labs    09/17/24 1113 09/25/24 1157 10/02/24 1245 10/02/24 1259 10/03/24 1403  WBC 7.5 8.0 6.9  --  7.0  HGB 8.9* 9.2* 8.6* 8.8* 9.8*  HCT 28.6* 30.8* 28.5* 26.0* 31.5*  PLT 288 418* 356  --  399    COAGS: No results for input(s): INR, APTT in the last 8760 hours.  BMP: Recent Labs    09/17/24 1113 09/25/24 1157 10/02/24 1245 10/02/24 1259 10/03/24 1403  NA 140 140 144 141 139  K 4.1 4.4 4.7 4.7 4.1  CL 106 109 106 110 101  CO2 21* 19* 20*  --  24  GLUCOSE 109* 99 101* 101* 93  BUN 21 21 18 18 19   CALCIUM  9.0 9.1 9.3  --  9.4  CREATININE 1.72* 1.65* 1.70* 1.80* 1.71*  GFRNONAA 29* 30* 29*  --  29*    LIVER FUNCTION TESTS: Recent Labs    11/18/23 1340 02/24/24 1353 09/06/24 1359 09/25/24 1157 10/02/24 1245  BILITOT 0.4 0.5  --  0.7 0.7  AST 20 14  --  24 26  ALT 17 11  --  20 23  ALKPHOS 88 94  --  115 119  PROT 7.3 7.1 5.8* 6.4* 6.2*  ALBUMIN 4.0 4.2  --  2.8* 2.9*    TUMOR MARKERS: No results for input(s): AFPTM, CEA, CA199, CHROMGRNA in the last 8760 hours.  Assessment and Plan:  Request for  image guided right PleurX catheter insertion approved by Dr. Philip, tentatively for 10/04/2024. No contraindications for procedure identified in ROS, physical exam, or review of pre-sedation considerations. Labs reviewed and within acceptable range 11/24 imaging available and reviewed  VSS, afebrile  Patient not asked to hold any AC/AP for this low bleeding risk procedure Abx - ancef  prophylaxis Pulm to determine frequency and volume of drainage.     Risks and benefits discussed with the patient and patient's daughter Carolyn Wells) including bleeding, infection, damage  to adjacent structures, malfunction of the catheter with need for additional  procedures.  All of the patient's questions were answered, patient is agreeable to proceed. Consent signed and in chart.   Thank you for allowing our service to participate in RAYOLA EVERHART 's care.    Electronically Signed: Laymon Coast, NP   10/04/2024, 9:50 AM     I spent a total of 20 Minutes   in face to face in clinical consultation, greater than 50% of which was counseling/coordinating care for image guided right PleurX catheter insertion.   (A copy of this note was sent to the referring provider and the time of visit.)

## 2024-10-05 ENCOUNTER — Inpatient Hospital Stay (HOSPITAL_COMMUNITY)

## 2024-10-05 DIAGNOSIS — J90 Pleural effusion, not elsewhere classified: Secondary | ICD-10-CM | POA: Diagnosis not present

## 2024-10-05 DIAGNOSIS — J910 Malignant pleural effusion: Secondary | ICD-10-CM

## 2024-10-05 LAB — BASIC METABOLIC PANEL WITH GFR
Anion gap: 13 (ref 5–15)
BUN: 24 mg/dL — ABNORMAL HIGH (ref 8–23)
CO2: 27 mmol/L (ref 22–32)
Calcium: 8.9 mg/dL (ref 8.9–10.3)
Chloride: 100 mmol/L (ref 98–111)
Creatinine, Ser: 1.87 mg/dL — ABNORMAL HIGH (ref 0.44–1.00)
GFR, Estimated: 26 mL/min — ABNORMAL LOW (ref >60)
Glucose, Bld: 93 mg/dL (ref 70–99)
Potassium: 3.7 mmol/L (ref 3.5–5.1)
Sodium: 140 mmol/L (ref 135–145)

## 2024-10-05 MED ORDER — FENTANYL CITRATE (PF) 50 MCG/ML IJ SOSY
12.5000 ug | PREFILLED_SYRINGE | Freq: Once | INTRAMUSCULAR | Status: AC
  Administered 2024-10-05: 12.5 ug via INTRAVENOUS
  Filled 2024-10-05: qty 1

## 2024-10-05 MED ORDER — OXYCODONE-ACETAMINOPHEN 5-325 MG PO TABS
1.0000 | ORAL_TABLET | Freq: Once | ORAL | Status: AC
  Administered 2024-10-05: 1 via ORAL
  Filled 2024-10-05: qty 1

## 2024-10-05 MED ORDER — METOPROLOL SUCCINATE ER 50 MG PO TB24
75.0000 mg | ORAL_TABLET | Freq: Every day | ORAL | Status: DC
  Administered 2024-10-05 – 2024-10-07 (×3): 75 mg via ORAL
  Filled 2024-10-05 (×3): qty 1

## 2024-10-05 NOTE — Progress Notes (Signed)
 PleurX draining education completed with patient's daughter [name] Terence). [name] was able to watch the PleurX drainage video and then return demonstrate the entire procedure using a practice kit with a practice PleurX drain. She is willing to help with draining procedure on her mother for additional practice when appropriate. First access and drain pending CT results/analysis by Pulmonology.

## 2024-10-05 NOTE — Plan of Care (Signed)

## 2024-10-05 NOTE — Progress Notes (Signed)
 10/05/2024 Pleurisy CT looks fine, incomplete expansion of lungs Looks more comfortable after percocet.  Most c/w entrapment less likely PTX  If pain worse before tomorrow's rounds would get CXR to assure we dont need to hook pleurX to atrium (left access kit + atrium in room).  Otherwise assuming entrapment, AM CXR to show lung is still up should be okay for home with some PRN percocet.  Would not do first pleurX drainage until Monday and use pleurisy rather\ than fluid output as marker to stop in a given session.  Dr. Geronimo to see tomorrow.  Rolan Sharps MD PCCM

## 2024-10-05 NOTE — Progress Notes (Signed)
 Patient and family caregiver, [name], provided written drainage instructions with pictures to review when needed and a QR code to the drainage video.

## 2024-10-05 NOTE — TOC Progression Note (Addendum)
 Transition of Care [facility]) - Progression Note    Patient Details  Name: Carolyn Wells MRN: [id number] Date of Birth: 1941/06/26  [facility] of [facility]) CM/SW Contact  Landry DELENA Senters, RN Phone Number: 10/05/2024, 2:06 PM  Clinical Narrative:     Patient may not be discharging today, due to uncontrolled pain.   Physician written order for pleurx is signed and in patient chart. Order may need to be modified before faxing out if patient's home orders for draining changes.   CM will continue to follow.                     Expected Discharge Plan and Services                                               Social Drivers of Health (SDOH) Interventions SDOH Screenings   Food Insecurity: No Food Insecurity (10/03/2024)  Housing: Low Risk  (10/03/2024)  Transportation Needs: No Transportation Needs (10/03/2024)  Utilities: Not At Risk (10/03/2024)  Alcohol Screen: Low Risk  (02/09/2023)  Depression (PHQ2-9): Low Risk  (09/26/2024)  Financial Resource Strain: Low Risk  (03/22/2024)   Received from [facility]  Physical Activity: Sufficiently Active (02/09/2023)  Social Connections: Moderately Integrated (10/03/2024)  Stress: No Stress Concern Present (02/09/2023)  Tobacco Use: Low Risk  (10/04/2024)    Readmission Risk Interventions     No data to display

## 2024-10-05 NOTE — Progress Notes (Signed)
 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 Sherrod recently and is due to start chemotherapy soon after port placement.  In the meantime she has had 3 thoracentesis for recurrent malignant effusion, last completed on 11/10. - Presented to the ED with worsening shortness of breath, swelling orthopnea.  Takes Lasix  every other day, in the ED tachypneic, mildly hypoxic placed on 2 L O2, BNP 614, troponin 18, creatinine 1.5, WBC 8, chest x-ray with bilateral effusion worse on the right   Subjective: -Feels short of breath, orthopneic  Assessment and Plan:  Recurrent malignant effusion - Has had numerous thoracentesis on the right - Appreciate pulmonary consult, IR consulted, underwent PleurX catheter placement yesterday -Now with severe chest wall pain, will attempt to notify IR, check CT chest - Hold off on restarting Xarelto  at this time  Newly diagnosed stage IV non-small cell lung cancer - Just saw Dr. Gatha last week, due to start chemotherapy soon after port placement next week  Acute on chronic diastolic CHF - Echo with EF 55-60%, normal RV, no significant valvular disease - Improved with diuresis, hold off on further diuretics today, continue metoprolol  for persistent A-fib - Start oral diuretics tomorrow  Persistent atrial fibrillation -Remains in A-fib, rate controlled, continue metoprolol , holding Xarelto   Hypothyroidism Continue Synthroid   DVT prophylaxis: SCDs Code Status: DNR Family Communication: Discussed with patient detail, no family at bedside  Disposition Plan: Home in 1 to 2 days  Consultants:    Procedures:   Antimicrobials:    Objective: Vitals:   10/04/24 1500 10/04/24 1915 10/05/24 0004 10/05/24 0416  BP: 134/80 (!)  150/83 128/63 136/85  Pulse: 74 74 73 87  Resp: 19 20 18 20   Temp: 98.1 F (36.7 C) 98.9 F (37.2 C) 98.7 F (37.1 C) 98.3 F (36.8 C)  TempSrc: Oral Oral Oral Oral  SpO2: 93% 91% 93% 93%    Intake/Output Summary (Last 24 hours) at 10/05/2024 1021 Last data filed at 10/04/2024 2300 Gross per 24 hour  Intake 240 ml  Output 350 ml  Net -110 ml   There were no vitals filed for this visit.  Examination:  General exam: Appears calm and comfortable, AO x 3 HEENT: no JVD Respiratory system: Decreased breath sounds at the bases, PleurX catheter noted in right lateral chest wall Cardiovascular system: S1 & S2 heard, RRR.  Abd: nondistended, soft and nontender.Normal bowel sounds heard. Central nervous system: Alert and oriented. No focal neurological deficits. Extremities: Trace edema Skin: No rashes Psychiatry:  Mood & affect appropriate.     Data Reviewed:   CBC: Recent Labs  Lab 10/02/24 1245 10/02/24 1259 10/03/24 1403  WBC 6.9  --  7.0  NEUTROABS 5.5  --   --   HGB 8.6* 8.8* 9.8*  HCT 28.5* 26.0* 31.5*  MCV 96.0  --  91.0  PLT 356  --  399   Basic Metabolic Panel: Recent Labs  Lab 10/02/24 1245 10/02/24 1259 10/03/24 1403 10/05/24 0300  NA 144 141 139 140  K 4.7 4.7 4.1 3.7  CL 106 110 101 100  CO2 20*  --  24 27  GLUCOSE 101* 101* 93 93  BUN 18 18 19  24*  CREATININE 1.70* 1.80* 1.71* 1.87*  CALCIUM  9.3  --  9.4 8.9  MG 1.8  --   --   --  GFR: Estimated Creatinine Clearance: 21 mL/min (A) (by C-G formula based on SCr of 1.87 mg/dL (H)). Liver Function Tests: Recent Labs  Lab 10/02/24 1245  AST 26  ALT 23  ALKPHOS 119  BILITOT 0.7  PROT 6.2*  ALBUMIN 2.9*   No results for input(s): LIPASE, AMYLASE in the last 168 hours. No results for input(s): AMMONIA in the last 168 hours. Coagulation Profile: No results for input(s): INR, PROTIME in the last 168 hours. Cardiac Enzymes: No results for input(s): CKTOTAL, CKMB,  CKMBINDEX, TROPONINI in the last 168 hours. BNP (last 3 results) Recent Labs    08/25/24 1349  PROBNP 695.0*   HbA1C: No results for input(s): HGBA1C in the last 72 hours. CBG: No results for input(s): GLUCAP in the last 168 hours. Lipid Profile: No results for input(s): CHOL, HDL, LDLCALC, TRIG, CHOLHDL, LDLDIRECT in the last 72 hours. Thyroid  Function Tests: No results for input(s): TSH, T4TOTAL, FREET4, T3FREE, THYROIDAB in the last 72 hours. Anemia Panel: No results for input(s): VITAMINB12, FOLATE, FERRITIN, TIBC, IRON, RETICCTPCT in the last 72 hours. Urine analysis:    Component Value Date/Time   COLORURINE YELLOW 11/18/2023 1340   APPEARANCEUR CLEAR 11/18/2023 1340   LABSPEC 1.025 11/18/2023 1340   PHURINE 5.5 11/18/2023 1340   GLUCOSEU NEGATIVE 11/18/2023 1340   HGBUR NEGATIVE 11/18/2023 1340   BILIRUBINUR NEGATIVE 11/18/2023 1340   KETONESUR TRACE (A) 11/18/2023 1340   UROBILINOGEN 0.2 11/18/2023 1340   NITRITE NEGATIVE 11/18/2023 1340   LEUKOCYTESUR NEGATIVE 11/18/2023 1340   Sepsis Labs: @LABRCNTIP (procalcitonin:4,lacticidven:4)  )No results found for this or any previous visit (from the past 240 hours).   Radiology Studies: DG Chest Port 1 View Result Date: 10/04/2024 CLINICAL DATA:  Lung cancer and recent placement of right chest PleurX catheter. EXAM: PORTABLE CHEST 1 VIEW COMPARISON:  Chest radiograph 10/02/2024 FINDINGS: PleurX catheter in the right chest with the tip near the apex. Evidence for small right apical pneumothorax. Right pleural effusion has been removed. Markedly improved aeration in the right lung. Patient has a mass along the medial aspect of the right upper lung measuring up to 4.2 cm. Persistent densities at the left lung base compatible with left pleural fluid. Heart size is stable and within normal limits. IMPRESSION: 1. Placement of a right chest PleurX catheter. Right pleural effusion has  essentially resolved. Evidence for a small right apical pneumothorax. 2. Small left pleural effusion. 3. Right lung mass. Electronically Signed   By: Juliene Balder M.D.   On: 10/04/2024 19:55   IR PERC PLEURAL DRAIN W/INDWELL CATH W/IMG GUIDE Result Date: 10/04/2024 INDICATION: 83 year old with lung cancer and recurrent right pleural effusion. EXAM: PLACEMENT OF TUNNELED PLEURAL DRAINAGE CATHETER WITH ULTRASOUND AND FLUOROSCOPIC GUIDANCE MEDICATIONS: Ancef  2 g ANESTHESIA/SEDATION: Moderate (conscious) sedation was employed during this procedure. A total of Versed  1.5 mg and Fentanyl  50 mcg was administered intravenously by the radiology nurse. Total intra-service moderate Sedation Time: 15 minutes. The patient's level of consciousness and vital signs were monitored continuously by radiology nursing throughout the procedure under my direct supervision. COMPLICATIONS: None immediate. FLUOROSCOPY: Radiation Exposure Index (as provided by the fluoroscopic device): 1 mGy Kerma PROCEDURE: Informed written consent was obtained from the patient after a thorough discussion of the procedural risks, benefits and alternatives. All questions were addressed. Maximal Sterile Barrier Technique was utilized including caps, mask, sterile gowns, sterile gloves, sterile drape, hand hygiene and skin antiseptic. A timeout was performed prior to the initiation of the procedure. Ultrasound demonstrated an adequate amount  of right pleural fluid. The right side of the chest was prepped and draped in sterile fashion. Skin was anesthetized with 1% lidocaine  into two spots. Two small incisions were made. Using ultrasound guidance, a thoracentesis catheter was directed into the pleural fluid along the more lateral incision. Fluid was aspirated from the catheter. PleurX catheter was tunneled between the 2 incisions. The cuff was placed underneath the skin. Wire was placed through the thoracentesis catheter and wire was advanced into the pleural  space under fluoroscopy. Catheter was removed over the wire and the tract was dilated to accommodate a peel-away sheath. PleurX was placed through the peel-away sheath and advanced into the pleural space. Pleural fluid was removed from the PleurX catheter. The thoracentesis skin access site was closed using absorbable suture and Dermabond. Catheter was secured to skin with suture. Dressing was placed. Fluoroscopic and ultrasound images were taken and saved for documentation. FINDINGS: 1.7 L of yellow pleural fluid was removed. IMPRESSION: Successful placement of a tunneled right pleural drainage catheter using ultrasound and fluoroscopic guidance. Electronically Signed   By: Juliene Balder M.D.   On: 10/04/2024 19:49     Scheduled Meds:  levothyroxine   50 mcg Oral Q0600   metoprolol  succinate  75 mg Oral Daily   pantoprazole   40 mg Oral Daily   sodium chloride  flush  3 mL Intravenous Q12H   Continuous Infusions:   ceFAZolin  (ANCEF ) IV       LOS: 2 days    Time spent:    Sigurd Pac, MD [facility]   10/05/2024, 10:21 AM

## 2024-10-05 NOTE — Consult Note (Signed)
 NAME:  Carolyn Wells, MRN:  [id number], DOB:  10-May-1941, LOS: 2 ADMISSION DATE:  10/02/2024, CONSULTATION DATE:  10/03/24 REFERRING MD:  Sigurd Pac of [facility], CHIEF COMPLAINT:  REcurrent malignant right pleural effusion    History of Present Illness: patient , daughter, chart and Dr SHAUNNA Pac    83 year old female admitted for recurrent right-sided malignant pleural effusion secondary to lung cancer  She is 36 with CKD, allergic rhinitis, acid reflux, hypertension, hyperlipidemia osteopenia, paroxysmal atrial fibrillation and solitary left kidney [status post right kidney donation to husband in 2007].  She got symptomatic in October 2025 which resulted in a diagnosis of stage IV, T2b, N3, M1 non-small cell lung cancer adenocarcinoma because of presentation with right upper lobe mass in addition to right hilar, mediastinal and right supraclavicular lymphadenopathy as well as malignant right pleural effusion diagnosed October 2025.Molecular studies by [facility] 1 showed positive KRAS G12C mutation and PD-L1 expression of 40%   She met Dr. Sherrod for the first time 09/26/2024.  She is awaiting Port-A-Cath.  Starting therapy with carboplatin plus paclitaxel plus Keytruda.  She has had 2 thoracentesis was 110/29/25 and second 111/6/25.  Both on the right side were positive for malignant cells.  She is readmitted for orthopnea, also cough when sitting up and exertional short of breath.  She feels it is because of the pleural effusion getting worse.  Symptoms of always improved during thoracentesis but they are recurrent.  Chest x-ray showed moderate right greater than left pleural effusion.  She had an echo 09/22/2024 withoiut pericardial effusion  Noted she is not on anticoagulation at home  [facility] hospitalist wants opinion on next steps in management as per given significant symptoms.   Past Medical History:   has a past medical history of ABDOMINAL INCISIONAL HERNIA (08/13/2009),  Allergic rhinitis, cause unspecified (09/28/2011), BRADYCARDIA (09/26/2010), CKD (chronic kidney disease), stage III (HCC) (02/26/2015), COLONIC POLYPS, HX OF (08/25/2007), Complication of anesthesia, DIVERTICULOSIS, COLON (08/25/2007), Dysrhythmia, GASTROESOPHAGEAL REFLUX DISEASE (08/04/2007), HYPERLIPIDEMIA (09/27/2010), HYPERTENSION (08/06/2007), Hypothyroidism, INTERNAL HEMORRHOIDS (08/04/2007), LOW BACK PAIN (08/06/2007), Lung cancer (HCC), OSTEOPENIA (08/04/2007), PAF (paroxysmal atrial fibrillation) (HCC), PONV (postoperative nausea and vomiting), and Solitary kidney, acquired (09/28/2011).   has a past surgical history that includes Nephrectomy; s/o incisional hernia repair; Laminectomy (1999); Tonsillectomy; Strabismus surgery (Bilateral, 04/14/2019); CARDIOVERSION ([name], 08/30/2024); IR THORACENTESIS ASP PLEURAL SPACE W/IMG GUIDE (09/06/2024); IR THORACENTESIS ASP PLEURAL SPACE W/IMG GUIDE (09/14/2024); IR THORACENTESIS ASP PLEURAL SPACE W/IMG GUIDE (09/28/2024); and IR PERC PLEURAL DRAIN W/INDWELL CATH W/IMG GUIDE (10/04/2024).     Significant Hospital Events:  10/02/2024 - admit 11/25 - pulm consult 11/26 - IR Rt Pleurx with > 1L drainage of pleural effusion   Interim History / Subjective:   10/05/2024 - still with siginficant pleural pain post pleurx. CXR yesterday post procedue without complication. STill not had CT chest. Says pain is miserable and feels PleurX might have been overcall.  PEr Camie Batty - NSAID caused gI issues. Pper daugther _ Gabapentin  made her drowsy in past.  Feels opoiodis fentanyl  did not help her  Objective   Blood pressure 136/85, pulse 87, temperature 98.3 F (36.8 C), temperature source Oral, resp. rate 20, SpO2 93%.        Intake/Output Summary (Last 24 hours) at 10/05/2024 1224 Last data filed at 10/04/2024 2300 Gross per 24 hour  Intake 240 ml  Output 350 ml  Net -110 ml   There were no vitals filed for this visit.  General Appearance:  Looks  stable  Head:  Normocephalic, without obvious abnormality, atraumatic Eyes:  PERRL - yes, conjunctiva/corneas - mujddy     Ears:  Normal external ear canals, both ears Nose:  G tube - no Throat:  ETT TUBE - no , OG tube - no Neck:  Supple,  No enlargement/tenderness/nodules Lungs: Clear to auscultation bilaterally,  Heart:  S1 and S2 normal, no murmur, CVP - no.  Pressors - no Abdomen:  Soft, no masses, no organomegaly Genitalia / Rectal:  Not done Extremities:  Extremities- intact Skin:  ntact in exposed areas . Sacral area - not examined Neurologic:  Sedation - none -> RASS - +1 . Moves all 4s - yes. CAM-ICU - neg . Orientation - x3+      Resolved Hospital Problem list   x  Assessment & Plan:  ASSESSMENT / PLAN:  PULMONARY  A:  Stage IV non-small cell lung cancer diagnosed October 2025 awaiting chemotherapy and Port-A-Cath  Recurrent right malignant pleural effusion that is symptomatic  10/05/2024 -> s/ Pleurx and doing well but for pleuritic pain that is unimproved and now impacting quality of life  P:   Control pain - d/w Dr Sigurd Pac - percocet x 1 dose and assess CT chest without contrast to asess chest tube location for complication IF pain not controlled, and /or pain meds have side effects, then Pleurx needs to come out Followup Pulmonary clinic Can eat  CCM will round  D/with Camie Batty CNS  Best practice (daily eval):  According to the hospitalist      SIGNATURE    Dr. Dorethia Cave, M.D., F.C.C.P,  Pulmonary and Critical Care Medicine Staff Physician, [facility] Center Director - Interstitial Lung Disease  Program  Pulmonary Fibrosis [facility] at [facility] [place], [place], [address]  NPI Number:  NPI #[id number]  Pager: [phone], If no answer  -> Check AMION or Try [phone] Telephone (clinical office): [phone] Telephone (research): [phone]  12:24  PM 10/05/2024   10/05/2024 12:24 PM    LABS    PULMONARY Recent Labs  Lab 10/02/24 1259  TCO2 22    CBC Recent Labs  Lab 10/02/24 1245 10/02/24 1259 10/03/24 1403  HGB 8.6* 8.8* 9.8*  HCT 28.5* 26.0* 31.5*  WBC 6.9  --  7.0  PLT 356  --  399    COAGULATION No results for input(s): INR in the last 168 hours.  CARDIAC  No results for input(s): TROPONINI in the last 168 hours. No results for input(s): PROBNP in the last 168 hours.  CHEMISTRY Recent Labs  Lab 10/02/24 1245 10/02/24 1259 10/03/24 1403 10/05/24 0300  NA 144 141 139 140  K 4.7 4.7 4.1 3.7  CL 106 110 101 100  CO2 20*  --  24 27  GLUCOSE 101* 101* 93 93  BUN 18 18 19  24*  CREATININE 1.70* 1.80* 1.71* 1.87*  CALCIUM  9.3  --  9.4 8.9  MG 1.8  --   --   --    Estimated Creatinine Clearance: 21 mL/min (A) (by C-G formula based on SCr of 1.87 mg/dL (H)).   LIVER Recent Labs  Lab 10/02/24 1245  AST 26  ALT 23  ALKPHOS 119  BILITOT 0.7  PROT 6.2*  ALBUMIN 2.9*     INFECTIOUS No results for input(s): LATICACIDVEN, PROCALCITON in the last 168 hours.   ENDOCRINE CBG (last 3)  No results for input(s): GLUCAP in the last 72 hours.  IMAGING x48h  - image(s) personally visualized  -   highlighted in bold DG Chest Port 1 View Result Date: 10/04/2024 CLINICAL DATA:  Lung cancer and recent placement of right chest PleurX catheter. EXAM: PORTABLE CHEST 1 VIEW COMPARISON:  Chest radiograph 10/02/2024 FINDINGS: PleurX catheter in the right chest with the tip near the apex. Evidence for small right apical pneumothorax. Right pleural effusion has been removed. Markedly improved aeration in the right lung. Patient has a mass along the medial aspect of the right upper lung measuring up to 4.2 cm. Persistent densities at the left lung base compatible with left pleural fluid. Heart size is stable and within normal limits. IMPRESSION: 1. Placement of a right chest PleurX catheter.  Right pleural effusion has essentially resolved. Evidence for a small right apical pneumothorax. 2. Small left pleural effusion. 3. Right lung mass. Electronically Signed   By: Juliene Balder M.D.   On: 10/04/2024 19:55   IR PERC PLEURAL DRAIN W/INDWELL CATH W/IMG GUIDE Result Date: 10/04/2024 INDICATION: 83 year old with lung cancer and recurrent right pleural effusion. EXAM: PLACEMENT OF TUNNELED PLEURAL DRAINAGE CATHETER WITH ULTRASOUND AND FLUOROSCOPIC GUIDANCE MEDICATIONS: Ancef  2 g ANESTHESIA/SEDATION: Moderate (conscious) sedation was employed during this procedure. A total of Versed  1.5 mg and Fentanyl  50 mcg was administered intravenously by the radiology nurse. Total intra-service moderate Sedation Time: 15 minutes. The patient's level of consciousness and vital signs were monitored continuously by radiology nursing throughout the procedure under my direct supervision. COMPLICATIONS: None immediate. FLUOROSCOPY: Radiation Exposure Index (as provided by the fluoroscopic device): 1 mGy Kerma PROCEDURE: Informed written consent was obtained from the patient after a thorough discussion of the procedural risks, benefits and alternatives. All questions were addressed. Maximal Sterile Barrier Technique was utilized including caps, mask, sterile gowns, sterile gloves, sterile drape, hand hygiene and skin antiseptic. A timeout was performed prior to the initiation of the procedure. Ultrasound demonstrated an adequate amount of right pleural fluid. The right side of the chest was prepped and draped in sterile fashion. Skin was anesthetized with 1% lidocaine  into two spots. Two small incisions were made. Using ultrasound guidance, a thoracentesis catheter was directed into the pleural fluid along the more lateral incision. Fluid was aspirated from the catheter. PleurX catheter was tunneled between the 2 incisions. The cuff was placed underneath the skin. Wire was placed through the thoracentesis catheter and wire was  advanced into the pleural space under fluoroscopy. Catheter was removed over the wire and the tract was dilated to accommodate a peel-away sheath. PleurX was placed through the peel-away sheath and advanced into the pleural space. Pleural fluid was removed from the PleurX catheter. The thoracentesis skin access site was closed using absorbable suture and Dermabond. Catheter was secured to skin with suture. Dressing was placed. Fluoroscopic and ultrasound images were taken and saved for documentation. FINDINGS: 1.7 L of yellow pleural fluid was removed. IMPRESSION: Successful placement of a tunneled right pleural drainage catheter using ultrasound and fluoroscopic guidance. Electronically Signed   By: Juliene Balder M.D.   On: 10/04/2024 19:49

## 2024-10-06 ENCOUNTER — Other Ambulatory Visit: Payer: Self-pay | Admitting: Student

## 2024-10-06 ENCOUNTER — Inpatient Hospital Stay (HOSPITAL_COMMUNITY)

## 2024-10-06 DIAGNOSIS — J90 Pleural effusion, not elsewhere classified: Secondary | ICD-10-CM | POA: Diagnosis not present

## 2024-10-06 LAB — BASIC METABOLIC PANEL WITH GFR
Anion gap: 11 (ref 5–15)
BUN: 24 mg/dL — ABNORMAL HIGH (ref 8–23)
CO2: 27 mmol/L (ref 22–32)
Calcium: 8.7 mg/dL — ABNORMAL LOW (ref 8.9–10.3)
Chloride: 103 mmol/L (ref 98–111)
Creatinine, Ser: 1.78 mg/dL — ABNORMAL HIGH (ref 0.44–1.00)
GFR, Estimated: 28 mL/min — ABNORMAL LOW (ref >60)
Glucose, Bld: 89 mg/dL (ref 70–99)
Potassium: 3.9 mmol/L (ref 3.5–5.1)
Sodium: 141 mmol/L (ref 135–145)

## 2024-10-06 LAB — CBC
HCT: 28.3 % — ABNORMAL LOW (ref 36.0–46.0)
Hemoglobin: 8.9 g/dL — ABNORMAL LOW (ref 12.0–15.0)
MCH: 28.3 pg (ref 26.0–34.0)
MCHC: 31.4 g/dL (ref 30.0–36.0)
MCV: 90.1 fL (ref 80.0–100.0)
Platelets: 320 K/uL (ref 150–400)
RBC: 3.14 MIL/uL — ABNORMAL LOW (ref 3.87–5.11)
RDW: 14.2 % (ref 11.5–15.5)
WBC: 7.4 K/uL (ref 4.0–10.5)
nRBC: 0 % (ref 0.0–0.2)

## 2024-10-06 MED ORDER — ALUM & MAG HYDROXIDE-SIMETH 200-200-20 MG/5ML PO SUSP
30.0000 mL | Freq: Four times a day (QID) | ORAL | Status: DC | PRN
  Administered 2024-10-06: 30 mL via ORAL
  Filled 2024-10-06: qty 30

## 2024-10-06 NOTE — Plan of Care (Signed)
  Problem: Health Behavior/Discharge Planning: Goal: Ability to manage health-related needs will improve Outcome: Progressing   Problem: Clinical Measurements: Goal: Respiratory complications will improve Outcome: Progressing Goal: Cardiovascular complication will be avoided Outcome: Progressing   Problem: Pain Managment: Goal: General experience of comfort will improve and/or be controlled Outcome: Progressing

## 2024-10-06 NOTE — TOC Progression Note (Signed)
 Transition of Care [facility]) - Progression Note    Patient Details  Name: Carolyn Wells MRN: [id number] Date of Birth: 04-Mar-1941  [facility] CM/SW Contact  Waddell Barnie Rama, RN Phone Number: 10/06/2024, 1:33 PM  Clinical Narrative:    NCM faxed the [employer] forms today and gave Princella (CMA) the forms to be maled.                      Expected Discharge Plan and Services                                               Social Drivers of Health (SDOH) Interventions SDOH Screenings   Food Insecurity: No Food Insecurity (10/03/2024)  Housing: Low Risk  (10/03/2024)  Transportation Needs: No Transportation Needs (10/03/2024)  Utilities: Not At Risk (10/03/2024)  Alcohol Screen: Low Risk  (02/09/2023)  Depression (PHQ2-9): Low Risk  (09/26/2024)  Financial Resource Strain: Low Risk  (03/22/2024)   Received from [facility]  Physical Activity: Sufficiently Active (02/09/2023)  Social Connections: Moderately Integrated (10/03/2024)  Stress: No Stress Concern Present (02/09/2023)  Tobacco Use: Low Risk  (10/04/2024)    Readmission Risk Interventions     No data to display

## 2024-10-06 NOTE — Plan of Care (Signed)
   Problem: Education: Goal: Knowledge of General Education information will improve Description Including pain rating scale, medication(s)/side effects and non-pharmacologic comfort measures Outcome: Progressing

## 2024-10-06 NOTE — Progress Notes (Signed)
 NAME:  Carolyn Wells, MRN:  [id number], DOB:  July 28, 1941, LOS: 3 ADMISSION DATE:  10/02/2024, CONSULTATION DATE:  10/03/24 REFERRING MD:  Sigurd Pac of [facility], CHIEF COMPLAINT:  REcurrent malignant right pleural effusion    History of Present Illness: patient , daughter, chart and Dr SHAUNNA Pac    83 year old female admitted for recurrent right-sided malignant pleural effusion secondary to lung cancer  She is 23 with CKD, allergic rhinitis, acid reflux, hypertension, hyperlipidemia osteopenia, paroxysmal atrial fibrillation and solitary left kidney [status post right kidney donation to husband in 2007].  She got symptomatic in October 2025 which resulted in a diagnosis of stage IV, T2b, N3, M1 non-small cell lung cancer adenocarcinoma because of presentation with right upper lobe mass in addition to right hilar, mediastinal and right supraclavicular lymphadenopathy as well as malignant right pleural effusion diagnosed October 2025.Molecular studies by [facility] 1 showed positive KRAS G12C mutation and PD-L1 expression of 40%   She met Dr. Sherrod for the first time 09/26/2024.  She is awaiting Port-A-Cath.  Starting therapy with carboplatin plus paclitaxel plus Keytruda.  She has had 2 thoracentesis was 110/29/25 and second 111/6/25.  Both on the right side were positive for malignant cells.  She is readmitted for orthopnea, also cough when sitting up and exertional short of breath.  She feels it is because of the pleural effusion getting worse.  Symptoms of always improved during thoracentesis but they are recurrent.  Chest x-ray showed moderate right greater than left pleural effusion.  She had an echo 09/22/2024 withoiut pericardial effusion  Noted she is not on anticoagulation at home  [facility] hospitalist wants opinion on next steps in management as per given significant symptoms.   Past Medical History:   has a past medical history of ABDOMINAL INCISIONAL HERNIA (08/13/2009),  Allergic rhinitis, cause unspecified (09/28/2011), BRADYCARDIA (09/26/2010), CKD (chronic kidney disease), stage III (HCC) (02/26/2015), COLONIC POLYPS, HX OF (08/25/2007), Complication of anesthesia, DIVERTICULOSIS, COLON (08/25/2007), Dysrhythmia, GASTROESOPHAGEAL REFLUX DISEASE (08/04/2007), HYPERLIPIDEMIA (09/27/2010), HYPERTENSION (08/06/2007), Hypothyroidism, INTERNAL HEMORRHOIDS (08/04/2007), LOW BACK PAIN (08/06/2007), Lung cancer (HCC), OSTEOPENIA (08/04/2007), PAF (paroxysmal atrial fibrillation) (HCC), PONV (postoperative nausea and vomiting), and Solitary kidney, acquired (09/28/2011).   has a past surgical history that includes Nephrectomy; s/o incisional hernia repair; Laminectomy (1999); Tonsillectomy; Strabismus surgery (Bilateral, 04/14/2019); CARDIOVERSION ([name], 08/30/2024); IR THORACENTESIS ASP PLEURAL SPACE W/IMG GUIDE (09/06/2024); IR THORACENTESIS ASP PLEURAL SPACE W/IMG GUIDE (09/14/2024); IR THORACENTESIS ASP PLEURAL SPACE W/IMG GUIDE (09/28/2024); and IR PERC PLEURAL DRAIN W/INDWELL CATH W/IMG GUIDE (10/04/2024).     Significant Hospital Events:  10/02/2024 - admit 11/25 - pulm consult 11/26 - IR Rt Pleurx with > 1L drainage of pleural effusion 11/27 -  - still with siginficant pleural pain post pleurx. CXR yesterday post procedue without complication. STill not had CT chest. Says pain is miserable and feels PleurX might have been overcall.  PEr Camie Batty - NSAID caused gI issues. Pper daugther _ Gabapentin  made her drowsy in past.  Feels opoiodis fentanyl  did not help her   Interim History / Subjective:   10/06/24: c/o severe pain from pleurx. Says pain meds only make her vomit. Pain is forcing her to be bed bound. Wants pleurx out  Objective   Blood pressure 106/60, pulse 73, temperature 98.4 F (36.9 C), temperature source Oral, resp. rate 17, SpO2 95%.        Intake/Output Summary (Last 24 hours) at 10/06/2024 1459 Last data filed at 10/06/2024 1326 Gross per 24  hour  Intake  420 ml  Output 250 ml  Net 170 ml   There were no vitals filed for this visit.  General Appearance:  Looks stabl. In Bed. 45 degree. Daughter and husband at side Head:  Normocephalic, without obvious abnormality, atraumatic Eyes:  PERRL - yes, conjunctiva/corneas - muddy     Ears:  Normal external ear canals, both ears Nose:  G tube - no Throat:  ETT TUBE - no , OG tube - non Neck:  Supple,  No enlargement/tenderness/nodules Lungs: Clear to auscultation bilaterally, Ventilator   Synchrony - o Heart:  S1 and S2 normal, no murmur, CVP - no.  Pressors - no Abdomen:  Soft, no masses, no organomegaly Genitalia / Rectal:  Not done Extremities:  Extremities- intact Skin:  ntact in exposed areas . Sacral area - not examined Neurologic:  Sedation - none -> RASS - +1 . Moves all 4s - yes. CAM-ICU - neg . Orientation - x3+        Resolved Hospital Problem list   x  Assessment & Plan:  ASSESSMENT / PLAN:  PULMONARY  A:  Stage IV non-small cell lung cancer diagnosed October 2025 awaiting chemotherapy and Port-A-Cath  Recurrent right malignant pleural effusion that is symptomatic - s/- Pleurx on 10/04/24  10/06/2024 -> severe pain impacting quality of  life. Likely due to PleurX friction on pleural surfaces. Pain meds causing side effects. She is not willing to wait for fluid to accumulate  P:   D/w Dr Fairy - take the pleuirx out due to patient wishes   Best practice (daily eval):  According to the hospitalist      SIGNATURE    Dr. Dorethia Cave, M.D., F.C.C.P,  Pulmonary and Critical Care Medicine Staff Physician, [facility] Director - Interstitial Lung Disease  Program  Pulmonary Fibrosis [facility] at [facility] [place], [place], [address]  NPI Number:  NPI #[id number]  Pager: [phone], If no answer  -> Check AMION or Try [phone] Telephone (clinical office): [phone] Telephone (research): [phone]  2:59 PM 10/06/2024   10/06/2024 2:59 PM    LABS    PULMONARY Recent Labs  Lab 10/02/24 1259  TCO2 22    CBC Recent Labs  Lab 10/02/24 1245 10/02/24 1259 10/03/24 1403 10/06/24 0237  HGB 8.6* 8.8* 9.8* 8.9*  HCT 28.5* 26.0* 31.5* 28.3*  WBC 6.9  --  7.0 7.4  PLT 356  --  399 320    COAGULATION No results for input(s): INR in the last 168 hours.  CARDIAC  No results for input(s): TROPONINI in the last 168 hours. No results for input(s): PROBNP in the last 168 hours.  CHEMISTRY Recent Labs  Lab 10/02/24 1245 10/02/24 1259 10/03/24 1403 10/05/24 0300 10/06/24 0237  NA 144 141 139 140 141  K 4.7 4.7 4.1 3.7 3.9  CL 106 110 101 100 103  CO2 20*  --  24 27 27   GLUCOSE 101* 101* 93 93 89  BUN 18 18 19  24* 24*  CREATININE 1.70* 1.80* 1.71* 1.87* 1.78*  CALCIUM  9.3  --  9.4 8.9 8.7*  MG 1.8  --   --   --   --    Estimated Creatinine Clearance: 22 mL/min (A) (by C-G formula based on SCr of 1.78 mg/dL (H)).   LIVER Recent Labs  Lab 10/02/24 1245  AST 26  ALT 23  ALKPHOS 119  BILITOT 0.7  PROT 6.2*  ALBUMIN  2.9*     INFECTIOUS No results for input(s): LATICACIDVEN, PROCALCITON in the last 168 hours.   ENDOCRINE CBG (last 3)  No results for input(s): GLUCAP in the last 72 hours.       IMAGING x48h  - image(s) personally visualized  -   highlighted in bold DG Chest Port 1 View Result Date: 10/06/2024 CLINICAL DATA:  Pleural effusion. History of lung cancer and recent placement of a PleurX catheter. EXAM: PORTABLE CHEST 1 VIEW COMPARISON:  10/04/2024 and chest CT 10/05/2024 FINDINGS: Right chest PleurX catheter is stable with the tip near the right lung apex. Right pneumothorax is not confidently identified. Again noted is the mass in the medial right upper lung. Mild haziness at the right lung base may represent some re-accumulation of pleural fluid. Persistent densities at left lung base compatible with small left  pleural effusion. Heart size is stable. IMPRESSION: 1. Stable position of the right chest PleurX catheter. Right pneumothorax is not confidently identified. 2. Mild haziness at the right lung base may represent some re-accumulation of pleural fluid. 3. Stable small left pleural effusion. Electronically Signed   By: Juliene Balder M.D.   On: 10/06/2024 08:30   CT CHEST WO CONTRAST Result Date: 10/05/2024 CLINICAL DATA:  History of lung cancer. Chest pain. * Tracking Code: BO * EXAM: CT CHEST WITHOUT CONTRAST TECHNIQUE: Multidetector CT imaging of the chest was performed following the standard protocol without IV contrast. RADIATION DOSE REDUCTION: This exam was performed according to the departmental dose-optimization program which includes automated exposure control, adjustment of the mA and/or kV according to patient size and/or use of iterative reconstruction technique. COMPARISON:  Chest radiograph dated 10/04/2024, CT chest dated 09/05/2024, nuclear medicine PET dated 09/19/2024 FINDINGS: Cardiovascular: Multichamber cardiomegaly. No significant pericardial fluid/thickening. Unchanged dilated ascending thoracic aorta measures 4.0 x 4.0 cm. Dilated main pulmonary artery measures 3.7 cm. Coronary artery calcifications and aortic atherosclerosis. Mediastinum/Nodes: Imaged thyroid  gland without nodules meeting criteria for imaging follow-up by size. Small hiatal hernia. Unchanged multi station lymphadenopathy, including 14 mm right subpectoral (3:31, 14 mm high left paratracheal (3:22), 19 mm precarinal (3:65), and 11 mm right hilar (3:75, remeasured). Lungs/Pleura: The central airways are patent. Mild centrilobular and paraseptal emphysema. 4.8 x 3.5 cm medial right apical mass (4:37), not substantially changed. Unchanged inferior right upper lobe ground-glass nodules measuring up to 7 mm (4:71) and solid right lower lobe nodules measuring up to 5 mm (4:104, 107). Triangular 4 x 3 mm right apical nodule (4:30) is  new compared to 09/05/2024. Unchanged small foci of tree-in-bud nodules within the medial left upper lobe (4:66). Subsegmental right middle lobe and lingular atelectasis, unchanged. Increased left lower lobe relaxation atelectasis. Interval resolution of previously noted anterior left lower lobe consolidation. Scattered calcified pulmonary nodules and subsegmental mucous plugging. Unchanged trace right apical pneumothorax status post placement of right inferolateral approach pleural catheter with tip terminating at the level of the lateral apex. Decreased trace right pleural effusion. Slightly increased small left pleural effusion. Upper abdomen: Normal. Musculoskeletal: No acute or abnormal lytic or blastic osseous lesions. Multilevel degenerative changes of the thoracic spine. Trace right lateral chest wall subcutaneous emphysema at the site of catheter insertion. IMPRESSION: 1. Unchanged trace right apical pneumothorax status post placement of right inferolateral approach pleural catheter with tip terminating at the level of the lateral apex. Decreased trace right pleural effusion. 2. Slightly increased small left pleural effusion with increased left lower lobe relaxation atelectasis. 3. Unchanged 4.8 cm medial right apical  mass and multistation lymphadenopathy. 4. New triangular 4 x 3 mm right apical nodule, which may be infectious/inflammatory. 5. Unchanged dilated ascending thoracic aorta measures 4.0 x 4.0 cm. Recommend annual imaging followup by CTA or MRA. This recommendation follows 2010 ACCF/AHA/AATS/ACR/ASA/SCA/SCAI/SIR/STS/SVM Guidelines for the Diagnosis and Management of Patients with Thoracic Aortic Disease. Circulation. 2010; 121: Z733-z630. Aortic aneurysm NOS (ICD10-I71.9) 6. Dilated main pulmonary artery measures 3.7 cm, which can be seen in the setting of pulmonary arterial hypertension. Aortic Atherosclerosis (ICD10-I70.0) and Emphysema (ICD10-J43.9). Coronary artery calcifications. Assessment  for potential risk factor modification, dietary therapy or pharmacologic therapy may be warranted, if clinically indicated. Electronically Signed   By: Limin  Xu M.D.   On: 10/05/2024 13:18

## 2024-10-06 NOTE — Progress Notes (Signed)
 Pt c/o 8/10 pain and refusing all pain medications. Pt also refusing Tylenol .

## 2024-10-06 NOTE — Progress Notes (Addendum)
 IR contacted for pleur-x removal per patient request. Transport arranged. However, team reached back out to tell me that they would like IR to hold. Per this communication with Dr. Fairy, pulmonologist would like to speak with patient one more time about her options and then will remove at that time if she still would like removed.   Transport cancelled and IR order discontinued.   Carolyn Spano NP 10/06/2024 3:28 PM

## 2024-10-06 NOTE — Progress Notes (Signed)
 PROGRESS NOTE    Carolyn Wells  FMW:[id number] DOB: 1941/03/04 DOA: 10/02/2024 PCP: [name], MD  83/F with history of persistent A-fib on Xarelto , chronic diastolic CHF, recently diagnosed metastatic adeno CA lung with recurrent malignant effusions, saw Dr. Sherrod recently and is due to start chemotherapy soon after port placement.  In the meantime she has had 3 thoracentesis for recurrent malignant effusion, last completed on 11/10. - Presented to the ED with worsening shortness of breath, swelling orthopnea.  Takes Lasix  every other day, in the ED tachypneic, mildly hypoxic placed on 2 L O2, BNP 614, troponin 18, creatinine 1.5, WBC 8, chest x-ray with bilateral effusion worse on the right. -Admitted, started on diuretics, pulmonary consulted - 11/26, PleurX catheter placed in IR> followed by severe pain - 11/27, continued pain in right lateral chest   Subjective: - Bothered by continuous pain in her chest, pain medicines do not help much and cause more problems namely dizziness nausea and vomiting  Assessment and Plan:  Recurrent malignant effusion - Has had numerous thoracentesis on the right - Appreciate pulmonary consult, IR consulted, underwent PleurX catheter placement 11/26 -Now with severe chest wall pain, IR notified, pulmonary following, CT chest yesterday with small pneumothorax, PleurX in appropriate position, concern for trapped lung -Unable to tolerate narcotics, avoiding NSAIDs with CKD 3b, await pulmonary input - Hold off on restarting Xarelto  at this time  Newly diagnosed stage IV non-small cell lung cancer - Just saw Dr. Gatha last week, due to start chemotherapy soon after port placement next week  Acute on chronic diastolic CHF - Echo with EF 55-60%, normal RV, no significant valvular disease - Improved with diuresis, hold off on further diuretics today, continue metoprolol  for persistent A-fib - Evaluate to start diuretics tomorrow  Persistent  atrial fibrillation -Remains in A-fib, rate controlled, continue metoprolol , holding Xarelto   Hypothyroidism Continue Synthroid   DVT prophylaxis: SCDs Code Status: DNR Family Communication: Discussed with patient detail, no family at bedside  Disposition Plan: Home pending improvement in symptoms  Consultants:    Procedures:   Antimicrobials:    Objective: Vitals:   10/05/24 1912 10/05/24 2350 10/06/24 0355 10/06/24 0714  BP: 128/84 133/71 130/64 133/77  Pulse: 74 74 72 78  Resp: 20 18 19 16   Temp: 98.1 F (36.7 C) 98.6 F (37 C) 98.4 F (36.9 C) 98.5 F (36.9 C)  TempSrc: Oral Oral Oral Oral  SpO2: 92% 93% 90% 95%    Intake/Output Summary (Last 24 hours) at 10/06/2024 1025 Last data filed at 10/06/2024 0800 Gross per 24 hour  Intake 420 ml  Output 250 ml  Net 170 ml   There were no vitals filed for this visit.  Examination:  General exam: Appears calm and comfortable, AO x 3 HEENT: no JVD Respiratory system: Decreased breath sounds at the bases, PleurX catheter noted in right lateral chest wall Cardiovascular system: S1 & S2 heard, RRR.  Abd: nondistended, soft and nontender.Normal bowel sounds heard. Central nervous system: Alert and oriented. No focal neurological deficits. Extremities: Trace edema Skin: No rashes Psychiatry:  Mood & affect appropriate.     Data Reviewed:   CBC: Recent Labs  Lab 10/02/24 1245 10/02/24 1259 10/03/24 1403 10/06/24 0237  WBC 6.9  --  7.0 7.4  NEUTROABS 5.5  --   --   --   HGB 8.6* 8.8* 9.8* 8.9*  HCT 28.5* 26.0* 31.5* 28.3*  MCV 96.0  --  91.0 90.1  PLT 356  --  399  320   Basic Metabolic Panel: Recent Labs  Lab 10/02/24 1245 10/02/24 1259 10/03/24 1403 10/05/24 0300 10/06/24 0237  NA 144 141 139 140 141  K 4.7 4.7 4.1 3.7 3.9  CL 106 110 101 100 103  CO2 20*  --  24 27 27   GLUCOSE 101* 101* 93 93 89  BUN 18 18 19  24* 24*  CREATININE 1.70* 1.80* 1.71* 1.87* 1.78*  CALCIUM  9.3  --  9.4 8.9 8.7*   MG 1.8  --   --   --   --    GFR: Estimated Creatinine Clearance: 22 mL/min (A) (by C-G formula based on SCr of 1.78 mg/dL (H)). Liver Function Tests: Recent Labs  Lab 10/02/24 1245  AST 26  ALT 23  ALKPHOS 119  BILITOT 0.7  PROT 6.2*  ALBUMIN 2.9*   No results for input(s): LIPASE, AMYLASE in the last 168 hours. No results for input(s): AMMONIA in the last 168 hours. Coagulation Profile: No results for input(s): INR, PROTIME in the last 168 hours. Cardiac Enzymes: No results for input(s): CKTOTAL, CKMB, CKMBINDEX, TROPONINI in the last 168 hours. BNP (last 3 results) Recent Labs    08/25/24 1349  PROBNP 695.0*   HbA1C: No results for input(s): HGBA1C in the last 72 hours. CBG: No results for input(s): GLUCAP in the last 168 hours. Lipid Profile: No results for input(s): CHOL, HDL, LDLCALC, TRIG, CHOLHDL, LDLDIRECT in the last 72 hours. Thyroid  Function Tests: No results for input(s): TSH, T4TOTAL, FREET4, T3FREE, THYROIDAB in the last 72 hours. Anemia Panel: No results for input(s): VITAMINB12, FOLATE, FERRITIN, TIBC, IRON, RETICCTPCT in the last 72 hours. Urine analysis:    Component Value Date/Time   COLORURINE YELLOW 11/18/2023 1340   APPEARANCEUR CLEAR 11/18/2023 1340   LABSPEC 1.025 11/18/2023 1340   PHURINE 5.5 11/18/2023 1340   GLUCOSEU NEGATIVE 11/18/2023 1340   HGBUR NEGATIVE 11/18/2023 1340   BILIRUBINUR NEGATIVE 11/18/2023 1340   KETONESUR TRACE (A) 11/18/2023 1340   UROBILINOGEN 0.2 11/18/2023 1340   NITRITE NEGATIVE 11/18/2023 1340   LEUKOCYTESUR NEGATIVE 11/18/2023 1340   Sepsis Labs: @LABRCNTIP (procalcitonin:4,lacticidven:4)  )No results found for this or any previous visit (from the past 240 hours).   Radiology Studies: DG Chest Port 1 View Result Date: 10/06/2024 CLINICAL DATA:  Pleural effusion. History of lung cancer and recent placement of a PleurX catheter. EXAM: PORTABLE CHEST  1 VIEW COMPARISON:  10/04/2024 and chest CT 10/05/2024 FINDINGS: Right chest PleurX catheter is stable with the tip near the right lung apex. Right pneumothorax is not confidently identified. Again noted is the mass in the medial right upper lung. Mild haziness at the right lung base may represent some re-accumulation of pleural fluid. Persistent densities at left lung base compatible with small left pleural effusion. Heart size is stable. IMPRESSION: 1. Stable position of the right chest PleurX catheter. Right pneumothorax is not confidently identified. 2. Mild haziness at the right lung base may represent some re-accumulation of pleural fluid. 3. Stable small left pleural effusion. Electronically Signed   By: Juliene Balder M.D.   On: 10/06/2024 08:30   CT CHEST WO CONTRAST Result Date: 10/05/2024 CLINICAL DATA:  History of lung cancer. Chest pain. * Tracking Code: BO * EXAM: CT CHEST WITHOUT CONTRAST TECHNIQUE: Multidetector CT imaging of the chest was performed following the standard protocol without IV contrast. RADIATION DOSE REDUCTION: This exam was performed according to the departmental dose-optimization program which includes automated exposure control, adjustment of the mA and/or kV according  to patient size and/or use of iterative reconstruction technique. COMPARISON:  Chest radiograph dated 10/04/2024, CT chest dated 09/05/2024, nuclear medicine PET dated 09/19/2024 FINDINGS: Cardiovascular: Multichamber cardiomegaly. No significant pericardial fluid/thickening. Unchanged dilated ascending thoracic aorta measures 4.0 x 4.0 cm. Dilated main pulmonary artery measures 3.7 cm. Coronary artery calcifications and aortic atherosclerosis. Mediastinum/Nodes: Imaged thyroid  gland without nodules meeting criteria for imaging follow-up by size. Small hiatal hernia. Unchanged multi station lymphadenopathy, including 14 mm right subpectoral (3:31, 14 mm high left paratracheal (3:22), 19 mm precarinal (3:65), and 11  mm right hilar (3:75, remeasured). Lungs/Pleura: The central airways are patent. Mild centrilobular and paraseptal emphysema. 4.8 x 3.5 cm medial right apical mass (4:37), not substantially changed. Unchanged inferior right upper lobe ground-glass nodules measuring up to 7 mm (4:71) and solid right lower lobe nodules measuring up to 5 mm (4:104, 107). Triangular 4 x 3 mm right apical nodule (4:30) is new compared to 09/05/2024. Unchanged small foci of tree-in-bud nodules within the medial left upper lobe (4:66). Subsegmental right middle lobe and lingular atelectasis, unchanged. Increased left lower lobe relaxation atelectasis. Interval resolution of previously noted anterior left lower lobe consolidation. Scattered calcified pulmonary nodules and subsegmental mucous plugging. Unchanged trace right apical pneumothorax status post placement of right inferolateral approach pleural catheter with tip terminating at the level of the lateral apex. Decreased trace right pleural effusion. Slightly increased small left pleural effusion. Upper abdomen: Normal. Musculoskeletal: No acute or abnormal lytic or blastic osseous lesions. Multilevel degenerative changes of the thoracic spine. Trace right lateral chest wall subcutaneous emphysema at the site of catheter insertion. IMPRESSION: 1. Unchanged trace right apical pneumothorax status post placement of right inferolateral approach pleural catheter with tip terminating at the level of the lateral apex. Decreased trace right pleural effusion. 2. Slightly increased small left pleural effusion with increased left lower lobe relaxation atelectasis. 3. Unchanged 4.8 cm medial right apical mass and multistation lymphadenopathy. 4. New triangular 4 x 3 mm right apical nodule, which may be infectious/inflammatory. 5. Unchanged dilated ascending thoracic aorta measures 4.0 x 4.0 cm. Recommend annual imaging followup by CTA or MRA. This recommendation follows 2010  ACCF/AHA/AATS/ACR/ASA/SCA/SCAI/SIR/STS/SVM Guidelines for the Diagnosis and Management of Patients with Thoracic Aortic Disease. Circulation. 2010; 121: Z733-z630. Aortic aneurysm NOS (ICD10-I71.9) 6. Dilated main pulmonary artery measures 3.7 cm, which can be seen in the setting of pulmonary arterial hypertension. Aortic Atherosclerosis (ICD10-I70.0) and Emphysema (ICD10-J43.9). Coronary artery calcifications. Assessment for potential risk factor modification, dietary therapy or pharmacologic therapy may be warranted, if clinically indicated. Electronically Signed   By: Limin  Xu M.D.   On: 10/05/2024 13:18   DG Chest Port 1 View Result Date: 10/04/2024 CLINICAL DATA:  Lung cancer and recent placement of right chest PleurX catheter. EXAM: PORTABLE CHEST 1 VIEW COMPARISON:  Chest radiograph 10/02/2024 FINDINGS: PleurX catheter in the right chest with the tip near the apex. Evidence for small right apical pneumothorax. Right pleural effusion has been removed. Markedly improved aeration in the right lung. Patient has a mass along the medial aspect of the right upper lung measuring up to 4.2 cm. Persistent densities at the left lung base compatible with left pleural fluid. Heart size is stable and within normal limits. IMPRESSION: 1. Placement of a right chest PleurX catheter. Right pleural effusion has essentially resolved. Evidence for a small right apical pneumothorax. 2. Small left pleural effusion. 3. Right lung mass. Electronically Signed   By: Juliene Balder M.D.   On: 10/04/2024 19:55   IR  PERC PLEURAL DRAIN W/INDWELL CATH W/IMG GUIDE Result Date: 10/04/2024 INDICATION: 83 year old with lung cancer and recurrent right pleural effusion. EXAM: PLACEMENT OF TUNNELED PLEURAL DRAINAGE CATHETER WITH ULTRASOUND AND FLUOROSCOPIC GUIDANCE MEDICATIONS: Ancef  2 g ANESTHESIA/SEDATION: Moderate (conscious) sedation was employed during this procedure. A total of Versed  1.5 mg and Fentanyl  50 mcg was administered  intravenously by the radiology nurse. Total intra-service moderate Sedation Time: 15 minutes. The patient's level of consciousness and vital signs were monitored continuously by radiology nursing throughout the procedure under my direct supervision. COMPLICATIONS: None immediate. FLUOROSCOPY: Radiation Exposure Index (as provided by the fluoroscopic device): 1 mGy Kerma PROCEDURE: Informed written consent was obtained from the patient after a thorough discussion of the procedural risks, benefits and alternatives. All questions were addressed. Maximal Sterile Barrier Technique was utilized including caps, mask, sterile gowns, sterile gloves, sterile drape, hand hygiene and skin antiseptic. A timeout was performed prior to the initiation of the procedure. Ultrasound demonstrated an adequate amount of right pleural fluid. The right side of the chest was prepped and draped in sterile fashion. Skin was anesthetized with 1% lidocaine  into two spots. Two small incisions were made. Using ultrasound guidance, a thoracentesis catheter was directed into the pleural fluid along the more lateral incision. Fluid was aspirated from the catheter. PleurX catheter was tunneled between the 2 incisions. The cuff was placed underneath the skin. Wire was placed through the thoracentesis catheter and wire was advanced into the pleural space under fluoroscopy. Catheter was removed over the wire and the tract was dilated to accommodate a peel-away sheath. PleurX was placed through the peel-away sheath and advanced into the pleural space. Pleural fluid was removed from the PleurX catheter. The thoracentesis skin access site was closed using absorbable suture and Dermabond. Catheter was secured to skin with suture. Dressing was placed. Fluoroscopic and ultrasound images were taken and saved for documentation. FINDINGS: 1.7 L of yellow pleural fluid was removed. IMPRESSION: Successful placement of a tunneled right pleural drainage catheter  using ultrasound and fluoroscopic guidance. Electronically Signed   By: Juliene Balder M.D.   On: 10/04/2024 19:49     Scheduled Meds:  levothyroxine   50 mcg Oral Q0600   metoprolol  succinate  75 mg Oral Daily   pantoprazole   40 mg Oral Daily   sodium chloride  flush  3 mL Intravenous Q12H   Continuous Infusions:     LOS: 3 days    Time spent:    Sigurd Pac, MD [facility]   10/06/2024, 10:25 AM

## 2024-10-06 NOTE — Progress Notes (Signed)
 10/06/2024  Unremitting pleurisy: if entrapped this should have been easing up over time. Discussed trials of different types of pain meds/regimens but she just wants the tube out.  Sutures removed, using gentle traction pleurX removed en bloc and dressing applied.  Check AM CXR then can likely go home with f/u either in IR or pulm for repeat thoracenteses as needed.  Rolan Sharps MD PCCM

## 2024-10-06 NOTE — Progress Notes (Signed)
 IP team reached out concerning pleurisy related to recent pleur-x drain placement by IR on 11/26. Updated imaging reviewed by Dr. Jenna. No concern for malposition or worsening PTX. Agree with documented assessment/plan from pulmonology team. No further IR intervention at this time.   Georgia Baria NP 10/06/2024 9:45 AM

## 2024-10-07 ENCOUNTER — Inpatient Hospital Stay (HOSPITAL_COMMUNITY)

## 2024-10-07 ENCOUNTER — Encounter: Payer: Self-pay | Admitting: Internal Medicine

## 2024-10-07 ENCOUNTER — Other Ambulatory Visit (HOSPITAL_COMMUNITY): Payer: Self-pay

## 2024-10-07 DIAGNOSIS — J90 Pleural effusion, not elsewhere classified: Secondary | ICD-10-CM | POA: Diagnosis not present

## 2024-10-07 LAB — CBC
HCT: 28.5 % — ABNORMAL LOW (ref 36.0–46.0)
Hemoglobin: 8.9 g/dL — ABNORMAL LOW (ref 12.0–15.0)
MCH: 28.3 pg (ref 26.0–34.0)
MCHC: 31.2 g/dL (ref 30.0–36.0)
MCV: 90.5 fL (ref 80.0–100.0)
Platelets: 278 K/uL (ref 150–400)
RBC: 3.15 MIL/uL — ABNORMAL LOW (ref 3.87–5.11)
RDW: 14.1 % (ref 11.5–15.5)
WBC: 8.7 K/uL (ref 4.0–10.5)
nRBC: 0 % (ref 0.0–0.2)

## 2024-10-07 LAB — BASIC METABOLIC PANEL WITH GFR
Anion gap: 9 (ref 5–15)
BUN: 25 mg/dL — ABNORMAL HIGH (ref 8–23)
CO2: 27 mmol/L (ref 22–32)
Calcium: 8.7 mg/dL — ABNORMAL LOW (ref 8.9–10.3)
Chloride: 102 mmol/L (ref 98–111)
Creatinine, Ser: 1.85 mg/dL — ABNORMAL HIGH (ref 0.44–1.00)
GFR, Estimated: 27 mL/min — ABNORMAL LOW (ref >60)
Glucose, Bld: 103 mg/dL — ABNORMAL HIGH (ref 70–99)
Potassium: 4 mmol/L (ref 3.5–5.1)
Sodium: 138 mmol/L (ref 135–145)

## 2024-10-07 MED ORDER — RIVAROXABAN 15 MG PO TABS: 15.0000 mg | ORAL_TABLET | Freq: Every day | ORAL | Status: DC

## 2024-10-07 MED ORDER — METOPROLOL SUCCINATE ER 25 MG PO TB24
75.0000 mg | ORAL_TABLET | Freq: Every day | ORAL | 0 refills | Status: AC
Start: 2024-10-08 — End: ?
  Filled 2024-10-07: qty 90, 30d supply, fill #0

## 2024-10-07 MED ORDER — POTASSIUM CHLORIDE CRYS ER 10 MEQ PO TBCR
20.0000 meq | EXTENDED_RELEASE_TABLET | ORAL | 0 refills | Status: DC
  Filled 2024-10-07: qty 60, 60d supply, fill #0

## 2024-10-07 MED ORDER — FUROSEMIDE 20 MG PO TABS
40.0000 mg | ORAL_TABLET | Freq: Every day | ORAL | 0 refills | Status: AC
Start: 2024-10-07 — End: ?
  Filled 2024-10-07: qty 60, 20d supply, fill #0

## 2024-10-07 NOTE — Discharge Summary (Signed)
 Physician Discharge Summary  Carolyn Wells FMW:[id number] DOB: 01-04-1941 DOA: 10/02/2024  PCP: [name], MD  Admit date: 10/02/2024 Discharge date: 10/07/2024  Time spent: 45 minutes  Recommendations for Outpatient Follow-up:  Oncology next week for Port-A-Cath followed by chemotherapy She will need intermittent thoracentesis  Discharge Diagnoses:  Principal Problem: Acute chronic diastolic CHF Recurrent malignant pleural effusion Stage IV non-small cell lung cancer Persistent atrial fibrillation   Pleural effusion   Malignant pleural effusion (HCC)   Discharge Condition: Improved  Diet recommendation: Close sodium, heart healthy  There were no vitals filed for this visit.  History of present illness:  83/F with history of persistent A-fib on Xarelto , chronic diastolic CHF, recently diagnosed metastatic adeno CA lung with recurrent malignant effusions, saw Dr. Sherrod recently and is due to start chemotherapy soon after port placement.  In the meantime she has had 3 thoracentesis for recurrent malignant effusion, last completed on 11/10. - Presented to the ED with worsening shortness of breath, swelling orthopnea.  Takes Lasix  every other day, in the ED tachypneic, mildly hypoxic placed on 2 L O2, BNP 614, troponin 18, creatinine 1.5, WBC 8, chest x-ray with bilateral effusion worse on the right. -Admitted, started on diuretics, pulmonary consulted - 11/26, PleurX catheter placed in IR> followed by severe pain - 11/27, continued severe pain in right lateral chest  Hospital Course:   Recurrent malignant effusion - Has had numerous thoracentesis on the right - Appreciate pulmonary consult, IR consulted, underwent PleurX catheter placement 11/26 - Then complicated by severe persistent right lateral chest wall pain, IR notified, pulmonary following, CT chest without complications, noted small pneumothorax, concern for trapped lung -Unable to tolerate narcotics, avoiding  NSAIDs with CKD 3b,  -After discussion with pulmonary, decision made to remove chest tube given severity of her pain, this was completed yesterday afternoon, repeat x-ray this morning is stable -Discharged home in a stable condition, advised to restart Xarelto  in 3 days, anticipate need for recurrent thoracentesis   Newly diagnosed stage IV non-small cell lung cancer - Just saw Dr. Gatha last week, due to start chemotherapy soon after port placement next week   Acute on chronic diastolic CHF - Echo with EF 55-60%, normal RV, no significant valvular disease - Improved with diuresis, continue metoprolol  for persistent A-fib - Restarted oral Lasix , dose increased to 40 mg daily   Persistent atrial fibrillation -Remains in A-fib, rate controlled, continue metoprolol , see discussion above, restarting Xarelto  in 3 days   Hypothyroidism Continue Synthroid   Discharge Exam: Vitals:   10/07/24 0734 10/07/24 0741  BP: 133/77   Pulse: 77   Resp: 17 20  Temp: 98.5 F (36.9 C)   SpO2:  95%   Gen: Awake, Alert, Oriented X 3,  HEENT: no JVD Lungs: Good air movement bilaterally, CTAB CVS: S1S2/RRR Abd: soft, Non tender, non distended, BS present Extremities: No edema Skin: no new rashes on exposed skin   Discharge Instructions   Discharge Instructions     Ambulatory Pleural Drainage Schedule   Complete by: As directed    Drain daily, up to max of 1L until patient is only able to drain out . If <125ml for 3 consecutive drains every other day, then call Interventional Radiology [phone]) for evaluation and possible removal.   Diet - low sodium heart healthy   Complete by: As directed    Discharge wound care:   Complete by: As directed    routine   Increase activity slowly   Complete by:  As directed    Pleural Drainage Schedule   Complete by: As directed    Pleural drainage schedule to start post prcoedure. Drain daily, up to max of 1L until patient is only able to drain  out . If <145ml for 3 consecutive drains then drain every other day. If <168ml for 3 consecutive drains every other day then call the practice that inserted the catheter for evaluation and possible removal. [facility] [phone]).      Allergies as of 10/07/2024       Reactions   Budesonide Other (See Comments)   Causes nasal bleeding   Elavil  [amitriptyline ] Other (See Comments)   Feels bad   Gabapentin  Other (See Comments)   Loopy effect   Tramadol  Other (See Comments)   Loopy feeling   Hydrocodone Nausea Only, Other (See Comments)   lethargy   Ipratropium    Causes nasal bleeding        Medication List     PAUSE taking these medications    Rivaroxaban  15 MG Tabs tablet Wait to take this until your doctor or other care provider tells you to start again. Commonly known as: Xarelto  Take 1 tablet (15 mg total) by mouth daily with supper.       STOP taking these medications    lidocaine -prilocaine  cream Commonly known as: EMLA    metoprolol  tartrate 25 MG tablet Commonly known as: LOPRESSOR        TAKE these medications    acetaminophen  500 MG tablet Commonly known as: TYLENOL  Take 500 mg by mouth daily as needed for moderate pain (pain score 4-6) or mild pain (pain score 1-3).   ALPRAZolam  1 MG tablet Commonly known as: XANAX  Take 1 mg by mouth at bedtime.   amLODipine  2.5 MG tablet Commonly known as: NORVASC  TAKE 1 TABLET BY MOUTH IN THE MORNING AND AT BEDTIME   Fish Oil Triple Strength 1400 MG Caps Take 1,400 mg by mouth daily.   furosemide  20 MG tablet Commonly known as: LASIX  Take 2 tablets (40 mg total) by mouth daily. Take extra dose as needed for weight gain of 3 LB in 1 day, 5 LB in 1 week What changed:  how much to take when to take this additional instructions   levothyroxine  50 MCG tablet Commonly known as: SYNTHROID  TAKE 1 TABLET BY MOUTH ONCE DAILY BEFORE BREAKFAST APPOINTMENT  REQUIRED  FOR  FUTURE  REFILLS    Lumigan 0.01 % Soln Generic drug: bimatoprost Place 1 drop into both eyes at bedtime.   metoprolol  succinate 25 MG 24 hr tablet Commonly known as: TOPROL -XL Take 3 tablets (75 mg total) by mouth daily. Start taking on: October 08, 2024   Miebo 1.338 GM/ML Soln Generic drug: Perfluorohexyloctane Place 1.338 mLs into both eyes 4 (four) times daily.   ondansetron  8 MG tablet Commonly known as: Zofran  Take 1 tablet (8 mg total) by mouth every 8 (eight) hours as needed for nausea or vomiting. Start on the third day after carboplatin.   pantoprazole  40 MG tablet Commonly known as: PROTONIX  Take 1 tablet (40 mg total) by mouth daily. TAKE 1 TABLET BY MOUTH ONCE DAILY What changed: additional instructions   potassium chloride  10 MEQ tablet Commonly known as: KLOR-CON  Take 2 tablets (20 mEq total) by mouth every other day. What changed: how much to take   prochlorperazine  10 MG tablet Commonly known as: COMPAZINE  Take 1 tablet (10 mg total) by mouth every 6 (six) hours as needed for nausea or vomiting.  vitamin C 1000 MG tablet Take 1,000 mg by mouth daily.   vitamin k 100 MCG tablet Take 100 mcg by mouth daily.   WOMENS 50+ MULTI VITAMIN/MIN PO Take 1 tablet by mouth daily.   PRESERVISION AREDS 2 PO Take 1 tablet by mouth daily.               Discharge Care Instructions  (From admission, onward)           Start     Ordered   10/07/24 0000  Discharge wound care:       Comments: routine   10/07/24 1047           Allergies  Allergen Reactions   Budesonide Other (See Comments)    Causes nasal bleeding   Elavil  [Amitriptyline ] Other (See Comments)    Feels bad   Gabapentin  Other (See Comments)    Loopy effect   Tramadol  Other (See Comments)    Loopy feeling   Hydrocodone Nausea Only and Other (See Comments)    lethargy   Ipratropium     Causes nasal bleeding      The results of significant diagnostics from this hospitalization (including  imaging, microbiology, ancillary and laboratory) are listed below for reference.    Significant Diagnostic Studies: DG Chest Port 1 View Result Date: 10/07/2024 CLINICAL DATA:  Pleural effusion.  Lung carcinoma. EXAM: PORTABLE CHEST 1 VIEW COMPARISON:  10/06/2024 FINDINGS: Heart size remains within normal limits. Atelectasis or infiltrate in left lung base and tiny left pleural effusion show no significant change. No pneumothorax visualized. Pulmonary hyperinflation again seen, consistent with COPD. 4.6 cm mass in the medial right upper lobe is unchanged. IMPRESSION: No significant change in left basilar atelectasis or infiltrate, and tiny left pleural effusion. Stable right upper lobe mass. COPD. Electronically Signed   By: Norleen DELENA Kil M.D.   On: 10/07/2024 07:24   DG Chest Port 1 View Result Date: 10/06/2024 CLINICAL DATA:  142230 Pleural effusion 142230 EXAM: PORTABLE CHEST - 1 VIEW COMPARISON:  October 06, 2026 FINDINGS: Redemonstrated medial right apical mass measuring 4.3 cm, unchanged. Trace right pleural effusion. Interval removal of the right-sided thoracostomy tube. No pneumothorax. No new airspace consolidation. Trace left pleural effusion. No cardiomegaly. Tortuous aorta. No acute fracture or destructive lesions. Multilevel thoracic osteophytosis. Osteopenia. IMPRESSION: 1. Interval removal of the right-sided thoracostomy tube. No pneumothorax. 2. Similar trace bilateral pleural effusions. Electronically Signed   By: Rogelia Myers M.D.   On: 10/06/2024 18:00   DG Chest Port 1 View Result Date: 10/06/2024 CLINICAL DATA:  Pleural effusion. History of lung cancer and recent placement of a PleurX catheter. EXAM: PORTABLE CHEST 1 VIEW COMPARISON:  10/04/2024 and chest CT 10/05/2024 FINDINGS: Right chest PleurX catheter is stable with the tip near the right lung apex. Right pneumothorax is not confidently identified. Again noted is the mass in the medial right upper lung. Mild haziness at the  right lung base may represent some re-accumulation of pleural fluid. Persistent densities at left lung base compatible with small left pleural effusion. Heart size is stable. IMPRESSION: 1. Stable position of the right chest PleurX catheter. Right pneumothorax is not confidently identified. 2. Mild haziness at the right lung base may represent some re-accumulation of pleural fluid. 3. Stable small left pleural effusion. Electronically Signed   By: Juliene Balder M.D.   On: 10/06/2024 08:30   CT CHEST WO CONTRAST Result Date: 10/05/2024 CLINICAL DATA:  History of lung cancer. Chest pain. * Tracking Code:  BO * EXAM: CT CHEST WITHOUT CONTRAST TECHNIQUE: Multidetector CT imaging of the chest was performed following the standard protocol without IV contrast. RADIATION DOSE REDUCTION: This exam was performed according to the departmental dose-optimization program which includes automated exposure control, adjustment of the mA and/or kV according to patient size and/or use of iterative reconstruction technique. COMPARISON:  Chest radiograph dated 10/04/2024, CT chest dated 09/05/2024, nuclear medicine PET dated 09/19/2024 FINDINGS: Cardiovascular: Multichamber cardiomegaly. No significant pericardial fluid/thickening. Unchanged dilated ascending thoracic aorta measures 4.0 x 4.0 cm. Dilated main pulmonary artery measures 3.7 cm. Coronary artery calcifications and aortic atherosclerosis. Mediastinum/Nodes: Imaged thyroid  gland without nodules meeting criteria for imaging follow-up by size. Small hiatal hernia. Unchanged multi station lymphadenopathy, including 14 mm right subpectoral (3:31, 14 mm high left paratracheal (3:22), 19 mm precarinal (3:65), and 11 mm right hilar (3:75, remeasured). Lungs/Pleura: The central airways are patent. Mild centrilobular and paraseptal emphysema. 4.8 x 3.5 cm medial right apical mass (4:37), not substantially changed. Unchanged inferior right upper lobe ground-glass nodules measuring up  to 7 mm (4:71) and solid right lower lobe nodules measuring up to 5 mm (4:104, 107). Triangular 4 x 3 mm right apical nodule (4:30) is new compared to 09/05/2024. Unchanged small foci of tree-in-bud nodules within the medial left upper lobe (4:66). Subsegmental right middle lobe and lingular atelectasis, unchanged. Increased left lower lobe relaxation atelectasis. Interval resolution of previously noted anterior left lower lobe consolidation. Scattered calcified pulmonary nodules and subsegmental mucous plugging. Unchanged trace right apical pneumothorax status post placement of right inferolateral approach pleural catheter with tip terminating at the level of the lateral apex. Decreased trace right pleural effusion. Slightly increased small left pleural effusion. Upper abdomen: Normal. Musculoskeletal: No acute or abnormal lytic or blastic osseous lesions. Multilevel degenerative changes of the thoracic spine. Trace right lateral chest wall subcutaneous emphysema at the site of catheter insertion. IMPRESSION: 1. Unchanged trace right apical pneumothorax status post placement of right inferolateral approach pleural catheter with tip terminating at the level of the lateral apex. Decreased trace right pleural effusion. 2. Slightly increased small left pleural effusion with increased left lower lobe relaxation atelectasis. 3. Unchanged 4.8 cm medial right apical mass and multistation lymphadenopathy. 4. New triangular 4 x 3 mm right apical nodule, which may be infectious/inflammatory. 5. Unchanged dilated ascending thoracic aorta measures 4.0 x 4.0 cm. Recommend annual imaging followup by CTA or MRA. This recommendation follows 2010 ACCF/AHA/AATS/ACR/ASA/SCA/SCAI/SIR/STS/SVM Guidelines for the Diagnosis and Management of Patients with Thoracic Aortic Disease. Circulation. 2010; 121: Z733-z630. Aortic aneurysm NOS (ICD10-I71.9) 6. Dilated main pulmonary artery measures 3.7 cm, which can be seen in the setting of  pulmonary arterial hypertension. Aortic Atherosclerosis (ICD10-I70.0) and Emphysema (ICD10-J43.9). Coronary artery calcifications. Assessment for potential risk factor modification, dietary therapy or pharmacologic therapy may be warranted, if clinically indicated. Electronically Signed   By: Limin  Xu M.D.   On: 10/05/2024 13:18   DG Chest Port 1 View Result Date: 10/04/2024 CLINICAL DATA:  Lung cancer and recent placement of right chest PleurX catheter. EXAM: PORTABLE CHEST 1 VIEW COMPARISON:  Chest radiograph 10/02/2024 FINDINGS: PleurX catheter in the right chest with the tip near the apex. Evidence for small right apical pneumothorax. Right pleural effusion has been removed. Markedly improved aeration in the right lung. Patient has a mass along the medial aspect of the right upper lung measuring up to 4.2 cm. Persistent densities at the left lung base compatible with left pleural fluid. Heart size is stable and within normal limits.  IMPRESSION: 1. Placement of a right chest PleurX catheter. Right pleural effusion has essentially resolved. Evidence for a small right apical pneumothorax. 2. Small left pleural effusion. 3. Right lung mass. Electronically Signed   By: Juliene Balder M.D.   On: 10/04/2024 19:55   IR PERC PLEURAL DRAIN W/INDWELL CATH W/IMG GUIDE Result Date: 10/04/2024 INDICATION: 83 year old with lung cancer and recurrent right pleural effusion. EXAM: PLACEMENT OF TUNNELED PLEURAL DRAINAGE CATHETER WITH ULTRASOUND AND FLUOROSCOPIC GUIDANCE MEDICATIONS: Ancef  2 g ANESTHESIA/SEDATION: Moderate (conscious) sedation was employed during this procedure. A total of Versed  1.5 mg and Fentanyl  50 mcg was administered intravenously by the radiology nurse. Total intra-service moderate Sedation Time: 15 minutes. The patient's level of consciousness and vital signs were monitored continuously by radiology nursing throughout the procedure under my direct supervision. COMPLICATIONS: None immediate.  FLUOROSCOPY: Radiation Exposure Index (as provided by the fluoroscopic device): 1 mGy Kerma PROCEDURE: Informed written consent was obtained from the patient after a thorough discussion of the procedural risks, benefits and alternatives. All questions were addressed. Maximal Sterile Barrier Technique was utilized including caps, mask, sterile gowns, sterile gloves, sterile drape, hand hygiene and skin antiseptic. A timeout was performed prior to the initiation of the procedure. Ultrasound demonstrated an adequate amount of right pleural fluid. The right side of the chest was prepped and draped in sterile fashion. Skin was anesthetized with 1% lidocaine  into two spots. Two small incisions were made. Using ultrasound guidance, a thoracentesis catheter was directed into the pleural fluid along the more lateral incision. Fluid was aspirated from the catheter. PleurX catheter was tunneled between the 2 incisions. The cuff was placed underneath the skin. Wire was placed through the thoracentesis catheter and wire was advanced into the pleural space under fluoroscopy. Catheter was removed over the wire and the tract was dilated to accommodate a peel-away sheath. PleurX was placed through the peel-away sheath and advanced into the pleural space. Pleural fluid was removed from the PleurX catheter. The thoracentesis skin access site was closed using absorbable suture and Dermabond. Catheter was secured to skin with suture. Dressing was placed. Fluoroscopic and ultrasound images were taken and saved for documentation. FINDINGS: 1.7 L of yellow pleural fluid was removed. IMPRESSION: Successful placement of a tunneled right pleural drainage catheter using ultrasound and fluoroscopic guidance. Electronically Signed   By: Juliene Balder M.D.   On: 10/04/2024 19:49   DG Chest Portable 1 View Result Date: 10/02/2024 CLINICAL DATA:  Shortness of breath. EXAM: PORTABLE CHEST 1 VIEW COMPARISON:  Chest radiograph dated 09/28/2024.  FINDINGS: Bilateral pleural effusions, right greater than left, and increased since the prior radiograph. There is bibasilar atelectasis or pneumonia. No pneumothorax. Stable cardiac silhouette. No acute osseous pathology. IMPRESSION: Bilateral pleural effusions, right greater than left, and increased since the prior radiograph. Electronically Signed   By: Vanetta Chou M.D.   On: 10/02/2024 13:14   IR THORACENTESIS ASP PLEURAL SPACE W/IMG GUIDE Result Date: 09/28/2024 INDICATION: History of right lung cancer with recurrent right pleural effusion. Request for therapeutic right thoracentesis. EXAM: ULTRASOUND GUIDED THERAPEUTIC RIGHT THORACENTESIS MEDICATIONS: 7 mL 1% lidocaine  COMPLICATIONS: None immediate. PROCEDURE: An ultrasound guided thoracentesis was thoroughly discussed with the patient and questions answered. The benefits, risks, alternatives and complications were also discussed. The patient understands and wishes to proceed with the procedure. Written consent was obtained. Ultrasound was performed to localize and mark an adequate pocket of fluid in the right chest. The area was then prepped and draped in the normal sterile fashion.  1% Lidocaine  was used for local anesthesia. Under ultrasound guidance a 6 Fr Safe-T-Centesis catheter was introduced. Thoracentesis was performed. The catheter was removed and a dressing applied. FINDINGS: A total of approximately 750 mL of dark yellow fluid was removed. IMPRESSION: Successful ultrasound guided right thoracentesis yielding 750 mL of pleural fluid. Performed and dictated by Kimble Clas, PA-C Electronically Signed   By: Marcey Moan M.D.   On: 09/28/2024 14:21   DG Chest 1 View Result Date: 09/28/2024 CLINICAL DATA:  Follow-up pleural effusion. Known right upper lobe lung cancer. EXAM: CHEST  1 VIEW COMPARISON:  Chest x-ray 09/25/2024, PET-CT 09/19/2024 FINDINGS: Lungs are somewhat hypoinflated demonstrate evidence of small bilateral pleural  effusions likely with associated bibasilar atelectasis without significant change. Stable known right lung cancer over the right upper lobe/right paramediastinal region. Cardiac silhouette is normal. Remainder of the exam is unchanged. IMPRESSION: 1. Stable small bilateral pleural effusions with associated bibasilar atelectasis. 2. Stable known right upper lobe lung cancer. Electronically Signed   By: Toribio Agreste M.D.   On: 09/28/2024 14:06   DG Chest 2 View Result Date: 09/25/2024 EXAM: 2 VIEW(S) XRAY OF THE CHEST 09/25/2024 12:37:00 PM COMPARISON: Paracin colon 09/20/2024. CLINICAL HISTORY: chest pain and shortness of breath with Hx of pleural effusions and lung cancer FINDINGS: LUNGS AND PLEURA: Stable bilateral pleural effusions. Bibasilar airspace opacities primarily representing atelectasis; a component of pneumonia cannot be excluded. Right upper lobe paramediastinal mass noted unchanged. Low lung volumes are present, causing crowding of the pulmonary vasculature. No pneumothorax. HEART AND MEDIASTINUM: No acute abnormality of the cardiac and mediastinal silhouettes. BONES AND SOFT TISSUES: No acute osseous abnormality. IMPRESSION: 1. Stable bilateral pleural effusions and bibasilar airspace opacities, primarily representing atelectasis. Pneumonia cannot be excluded. 2. Unchanged right upper lobe paramediastinal mass. 3. Low lung volumes causing crowding of the pulmonary vasculature. Electronically signed by: Ryan Salvage MD 09/25/2024 01:42 PM EST RP Workstation: [id number]   ECHOCARDIOGRAM COMPLETE Result Date: 09/22/2024    ECHOCARDIOGRAM REPORT   Patient Name:   Carolyn Wells Date of Exam: 09/22/2024 Medical Rec #:  [id number]         Height:       66.0 in Accession #:    [id number]        Weight:       143.6 lb Date of Birth:  [birth year]         BSA:          1.737 m Patient Age:    83 years          BP:           132/80 mmHg Patient Gender: F                 HR:           80 bpm. Exam  Location:  [address] Procedure: 2D Echo, Cardiac Doppler and Color Doppler (Both Spectral and Color            Flow Doppler were utilized during procedure). Indications:    I48.91 Atrial fibrillation  History:        Patient has prior history of Echocardiogram examinations, most                 recent 01/07/2023. Pleural effusion, Lung cancer,                 Arrythmias:Atrial Fibrillation, Signs/Symptoms:Shortness of  Breath; Risk Factors:Hypertension and Dyslipidemia.  Sonographer:    Elsie Bohr RDCS Referring Phys: [id number] JARED E SEGAL IMPRESSIONS  1. Left ventricular ejection fraction, by estimation, is 55 to 60%. The left ventricle has normal function. The left ventricle has no regional wall motion abnormalities. There is mild concentric left ventricular hypertrophy. Left ventricular diastolic function could not be evaluated.  2. Right ventricular systolic function is normal. The right ventricular size is normal.  3. Left atrial size was mildly dilated.  4. Right atrial size was mildly dilated.  5. Moderate pleural effusion in the left lateral region.  6. The mitral valve is normal in structure. Trivial mitral valve regurgitation. No evidence of mitral stenosis.  7. The aortic valve is tricuspid. Aortic valve regurgitation is mild. Aortic valve sclerosis is present, with no evidence of aortic valve stenosis.  8. The inferior vena cava is normal in size with greater than 50% respiratory variability, suggesting right atrial pressure of 3 mmHg. FINDINGS  Left Ventricle: Left ventricular ejection fraction, by estimation, is 55 to 60%. The left ventricle has normal function. The left ventricle has no regional wall motion abnormalities. The left ventricular internal cavity size was normal in size. There is  mild concentric left ventricular hypertrophy. Left ventricular diastolic function could not be evaluated due to atrial fibrillation. Left ventricular diastolic function could not be  evaluated. Right Ventricle: The right ventricular size is normal. Right ventricular systolic function is normal. Left Atrium: Left atrial size was mildly dilated. Right Atrium: Right atrial size was mildly dilated. Pericardium: Trivial pericardial effusion is present. Mitral Valve: The mitral valve is normal in structure. Trivial mitral valve regurgitation. No evidence of mitral valve stenosis. Tricuspid Valve: The tricuspid valve is normal in structure. Tricuspid valve regurgitation is trivial. No evidence of tricuspid stenosis. Aortic Valve: The aortic valve is tricuspid. Aortic valve regurgitation is mild. Aortic regurgitation PHT measures 571 msec. Aortic valve sclerosis is present, with no evidence of aortic valve stenosis. Pulmonic Valve: The pulmonic valve was normal in structure. Pulmonic valve regurgitation is trivial. No evidence of pulmonic stenosis. Aorta: The aortic root is normal in size and structure. Venous: The inferior vena cava is normal in size with greater than 50% respiratory variability, suggesting right atrial pressure of 3 mmHg. IAS/Shunts: No atrial level shunt detected by color flow Doppler. Additional Comments: There is a moderate pleural effusion in the left lateral region.  LEFT VENTRICLE PLAX 2D LVIDd:         4.30 cm LVIDs:         2.90 cm LV PW:         1.20 cm LV IVS:        1.20 cm LVOT diam:     1.90 cm LV SV:         48 LV SV Index:   28 LVOT Area:     2.84 cm  IVC IVC diam: 1.90 cm LEFT ATRIUM             Index        RIGHT ATRIUM           Index LA diam:        4.40 cm 2.53 cm/m   RA Area:     13.90 cm LA Vol (A2C):   46.9 ml 27.00 ml/m  RA Volume:   38.60 ml  22.22 ml/m LA Vol (A4C):   46.5 ml 26.77 ml/m LA Biplane Vol: 47.3 ml 27.23 ml/m  AORTIC VALVE LVOT Vmax:  84.60 cm/s LVOT Vmean:  54.720 cm/s LVOT VTI:    0.171 m AI PHT:      571 msec  AORTA Ao Root diam: 3.10 cm Ao Asc diam:  3.50 cm MITRAL VALVE                TRICUSPID VALVE MV Area (PHT): 4.16 cm     TR  Peak grad:   32.9 mmHg MV Decel Time: 182 msec     TR Vmax:        287.00 cm/s MV E velocity: 114.40 cm/s                             SHUNTS                             Systemic VTI:  0.17 m                             Systemic Diam: 1.90 cm Redell Shallow MD Electronically signed by Redell Shallow MD Signature Date/Time: 09/22/2024/8:57:10 AM    Final    DG Chest 2 View Result Date: 09/20/2024 EXAM: 2 VIEW(S) XRAY OF THE CHEST 09/20/2024 10:58:24 AM COMPARISON: 09/17/2024 CLINICAL HISTORY: pleural effusion FINDINGS: LUNGS AND PLEURA: Similarly appearing, right upper lobe mass abutting the mediastinum. Small left pleural effusion. Streaky opacities in both lung bases, likely atelectasis. No pulmonary edema. No pneumothorax. HEART AND MEDIASTINUM: The right upper lobe mass abuts the mediastinum. No acute abnormality of the cardiac silhouette. BONES AND SOFT TISSUES: No acute osseous abnormality. IMPRESSION: 1. Streaky bibasilar airspace opacities, likely atelectasis. Trace left pleural effusion. 2. Right upper lobe mass abutting the mediastinum, similar to prior exam. Electronically signed by: Rogelia Myers MD 09/20/2024 11:31 AM EST RP Workstation: [id number]   NM PET Image Initial (PI) Skull Base To Thigh Result Date: 09/20/2024 CLINICAL DATA:  Initial treatment strategy for non-small cell lung cancer. EXAM: NUCLEAR MEDICINE PET SKULL BASE TO THIGH TECHNIQUE: 7.1 mCi F-18 FDG was injected intravenously. Full-ring PET imaging was performed from the skull base to thigh after the radiotracer. CT data was obtained and used for attenuation correction and anatomic localization. Fasting blood glucose: 89 mg/dl COMPARISON:  CT chest 89/71/7974. FINDINGS: Mediastinal blood pool activity: SUV max 2.8 Liver activity: SUV max NA NECK: No abnormal hypermetabolism. Incidental CT findings: None. CHEST: Hypermetabolic supraclavicular, mediastinal, left hilar, right axillary, right internal mammary and prepericardiac lymph  nodes. Index AP window lymph node measures 1.4 cm (4/68), SUV max 8.4. Medial right upper lobe mass has a long border of contact with the adjacent mediastinum and measures 3.0 x 4.4 cm with SUV max 12.1. Hypermetabolic nodular consolidation in the anterolateral left lower lobe measures approximately 10 mm (7/59), SUV max 3.1. No additional abnormal hypermetabolism. Incidental CT findings: Atherosclerotic calcification of the aorta, aortic valve and coronary arteries. Heart is enlarged. No pericardial effusion. Moderate right and small left pleural effusions. Volume loss in the lateral segment right middle lobe and inferior lingula. ABDOMEN/PELVIS: No abnormal hypermetabolism. Incidental CT findings: Right kidney is absent. Low-attenuation lesion in the left kidney. No specific follow-up necessary. SKELETON: No abnormal hypermetabolism. Incidental CT findings: Degenerative changes in the spine. IMPRESSION: 1. Hypermetabolic right upper lobe mass with hypermetabolic adenopathy extending to the left supraclavicular region, findings compatible with at least T2bN3M0 or stage IIIB primary bronchogenic carcinoma. Hypermetabolic nodular  consolidation in the anterior left lower lobe may be infectious/inflammatory in etiology. Metastatic disease or stage IVA disease cannot be definitively excluded. 2. Moderate right and small left pleural effusions. 3. Aortic atherosclerosis (ICD10-I70.0). Coronary artery calcification. Electronically Signed   By: Newell Eke M.D.   On: 09/20/2024 11:10   MR BRAIN W WO CONTRAST Result Date: 09/20/2024 CLINICAL DATA:  Non-small cell lung cancer.  Staging. EXAM: MRI HEAD WITHOUT AND WITH CONTRAST TECHNIQUE: Multiplanar, multiecho pulse sequences of the brain and surrounding structures were obtained without and with intravenous contrast. CONTRAST:  6.5mL GADAVIST  GADOBUTROL  1 MMOL/ML IV SOLN COMPARISON:  Head CT 06/27/2023 FINDINGS: Brain: No focal finding affects the brainstem or  cerebellum. Mild cerebellar volume loss, age related. Cerebral hemispheres show age related volume loss without subjective lobar predominance. There is a background pattern of mild chronic small-vessel ischemic change of the deep white matter. There are 3 subcentimeter foci of restricted diffusion in the right frontal lobe. Two of these are punctate and the larger focus in the medial inferior frontal lobe measures up to 8 mm. There is no edema or enhancement associated with these foci and they are favored to represent small embolic strokes. Nonenhancing metastases are possible but less likely. The clustered nature favors vascular pathology. No evidence of any other brain metastasis. There is an incidental meningioma measuring 1.8 cm in diameter at the frontoparietal vertex on the left. Minimal indentation of the surface of the brain but no significant mass effect. Broad surface along the superior sagittal sinus but without evidence of sinus invasion or occlusion. No hydrocephalus or extra-axial collection. Vascular: Major vessels at the base of the brain show flow. Skull and upper cervical spine: Negative Sinuses/Orbits: Clear/normal Other: None IMPRESSION: 1. 3 subcentimeter foci of restricted diffusion in the right frontal lobe. Two of these are punctate and the larger focus in the medial inferior frontal lobe measures up to 8 mm. There is no edema or enhancement associated with these foci and they are favored to represent recent small embolic strokes. Nonenhancing metastases are possible but less likely. The clustered nature favors vascular pathology. 2. No evidence of any other brain metastasis. 3. 1.8 cm meningioma at the frontoparietal vertex on the left. Minimal indentation of the surface of the brain but no significant mass effect. Broad surface along the superior sagittal sinus but without evidence of sinus invasion or occlusion. No apparent change compared to the CT scan August 2024. Electronically Signed    By: Oneil Officer M.D.   On: 09/20/2024 11:00   DG Chest 1 View Result Date: 09/18/2024 CLINICAL DATA:  Status post right thoracentesis EXAM: CHEST  1 VIEW COMPARISON:  Chest x-ray performed September 06, 2024 FINDINGS: No pneumothorax. Improvement in aeration of the right lung with trace residual pleural fluid. Heart size is unchanged. Right upper lobe lesion is unchanged. Degenerative changes in the imaged osseous structures. IMPRESSION: 1. Improvement in aeration of the right lung without pneumothorax. 2. Stable appearance of right lung lesion. Electronically Signed   By: Maude Naegeli M.D.   On: 09/18/2024 07:42   DG Chest 2 View Result Date: 09/17/2024 EXAM: 2 VIEW(S) XRAY OF THE CHEST 09/17/2024 11:59:09 AM COMPARISON: 09/14/2024 CLINICAL HISTORY: sob, cp FINDINGS: LUNGS AND PLEURA: Lateral view degraded by patient arm position, not raised above the head. Hyperinflation. Increased right and persistent left base airspace disease. Slight increase in small right pleural effusion. Trace left pleural fluid. No pulmonary edema. No pneumothorax. HEART AND MEDIASTINUM: Mild cardiomegaly. Right paratracheal  mass has been detailed previously. BONES AND SOFT TISSUES: Skin fold over the superolateral right hemithorax. No acute osseous abnormality. IMPRESSION: 1. increase in small right and persistent small left pleural effusions, without pneumothorax. 2. Increased right and similar left base airspace disease, likely atelectasis. Electronically signed by: Rockey Kilts MD 09/17/2024 12:33 PM EST RP Workstation: [id number]   IR THORACENTESIS ASP PLEURAL SPACE W/IMG GUIDE Result Date: 09/14/2024 INDICATION: Patient with right upper lobe lung mass and recurrent right pleural effusion. Consult for diagnostic and therapeutic right thoracentesis. Her most recent thoracentesis was 09/06/24 (right, 1 liter output). EXAM: ULTRASOUND GUIDED RIGHT THORACENTESIS MEDICATIONS: 8 mL 1% lidocaine  with epi COMPLICATIONS: None  immediate. PROCEDURE: An ultrasound guided thoracentesis was thoroughly discussed with the patient and questions answered. The benefits, risks, alternatives and complications were also discussed. The patient understands and wishes to proceed with the procedure. Written consent was obtained. Ultrasound was performed to localize and mark an adequate pocket of fluid in the right chest. The area was then prepped and draped in the normal sterile fashion. 1% Lidocaine  was used for local anesthesia. Under ultrasound guidance a 6 Fr Safe-T-Centesis catheter was introduced. Thoracentesis was performed. The catheter was removed and a dressing applied. FINDINGS: A total of approximately 950 milliliters of clear, yellow fluid was removed. Samples were sent to the laboratory as requested by the clinical team. IMPRESSION: Successful ultrasound guided right thoracentesis yielding 950 milliliters of pleural fluid. Performed by Laymon Coast, NP under the supervision of Dr. Jennefer. Electronically Signed   By: Ester Jennefer M.D.   On: 09/14/2024 13:27   DG Chest 2 View Result Date: 09/12/2024 CLINICAL DATA:  Evaluate pleural effusion. EXAM: CHEST - 2 VIEW COMPARISON:  Chest x-ray 09/06/2024, chest CT 09/05/2024 FINDINGS: Lungs are adequately inflated demonstrate moderate size right pleural effusion with reaccumulation since the previous exam. Small left effusion. Likely associated bibasilar atelectatic change. Known right upper paramediastinal lung mass unchanged. Cardiomediastinal silhouette and remainder of the exam is unchanged. IMPRESSION: 1. Moderate size right pleural effusion with reaccumulation since the previous exam. Small left effusion. Likely associated bibasilar atelectatic change. 2. Known right upper paramediastinal lung mass unchanged. Electronically Signed   By: Toribio Agreste M.D.   On: 09/12/2024 11:52    Microbiology: No results found for this or any previous visit (from the past 240 hours).    Labs: Basic Metabolic Panel: Recent Labs  Lab 10/02/24 1245 10/02/24 1259 10/03/24 1403 10/05/24 0300 10/06/24 0237 10/07/24 0249  NA 144 141 139 140 141 138  K 4.7 4.7 4.1 3.7 3.9 4.0  CL 106 110 101 100 103 102  CO2 20*  --  24 27 27 27   GLUCOSE 101* 101* 93 93 89 103*  BUN 18 18 19  24* 24* 25*  CREATININE 1.70* 1.80* 1.71* 1.87* 1.78* 1.85*  CALCIUM  9.3  --  9.4 8.9 8.7* 8.7*  MG 1.8  --   --   --   --   --    Liver Function Tests: Recent Labs  Lab 10/02/24 1245  AST 26  ALT 23  ALKPHOS 119  BILITOT 0.7  PROT 6.2*  ALBUMIN 2.9*   No results for input(s): LIPASE, AMYLASE in the last 168 hours. No results for input(s): AMMONIA in the last 168 hours. CBC: Recent Labs  Lab 10/02/24 1245 10/02/24 1259 10/03/24 1403 10/06/24 0237 10/07/24 0249  WBC 6.9  --  7.0 7.4 8.7  NEUTROABS 5.5  --   --   --   --  HGB 8.6* 8.8* 9.8* 8.9* 8.9*  HCT 28.5* 26.0* 31.5* 28.3* 28.5*  MCV 96.0  --  91.0 90.1 90.5  PLT 356  --  399 320 278   Cardiac Enzymes: No results for input(s): CKTOTAL, CKMB, CKMBINDEX, TROPONINI in the last 168 hours. BNP: BNP (last 3 results) Recent Labs    09/17/24 1130 09/25/24 1156 10/02/24 1245  BNP 618.7* 614.2* 667.9*    ProBNP (last 3 results) Recent Labs    08/25/24 1349  PROBNP 695.0*    CBG: No results for input(s): GLUCAP in the last 168 hours.     Signed:  Sigurd Pac MD.  [facility] 10/07/2024, 10:47 AM

## 2024-10-09 ENCOUNTER — Telehealth: Payer: Self-pay

## 2024-10-09 ENCOUNTER — Other Ambulatory Visit: Payer: Self-pay

## 2024-10-09 ENCOUNTER — Telehealth: Payer: Self-pay | Admitting: *Deleted

## 2024-10-09 ENCOUNTER — Ambulatory Visit (HOSPITAL_COMMUNITY)
Admission: RE | Admit: 2024-10-09 | Discharge: 2024-10-09 | Disposition: A | Discharge status: Home / Self Care | Source: Ambulatory Visit | Attending: Internal Medicine | Admitting: Internal Medicine

## 2024-10-09 DIAGNOSIS — Z7901 Long term (current) use of anticoagulants: Secondary | ICD-10-CM | POA: Insufficient documentation

## 2024-10-09 DIAGNOSIS — C3411 Malignant neoplasm of upper lobe, right bronchus or lung: Secondary | ICD-10-CM | POA: Insufficient documentation

## 2024-10-09 DIAGNOSIS — J90 Pleural effusion, not elsewhere classified: Secondary | ICD-10-CM | POA: Insufficient documentation

## 2024-10-09 DIAGNOSIS — N183 Chronic kidney disease, stage 3 unspecified: Secondary | ICD-10-CM | POA: Insufficient documentation

## 2024-10-09 DIAGNOSIS — I480 Paroxysmal atrial fibrillation: Secondary | ICD-10-CM | POA: Insufficient documentation

## 2024-10-09 DIAGNOSIS — I509 Heart failure, unspecified: Secondary | ICD-10-CM | POA: Insufficient documentation

## 2024-10-09 DIAGNOSIS — Z66 Do not resuscitate: Secondary | ICD-10-CM | POA: Insufficient documentation

## 2024-10-09 DIAGNOSIS — Z79899 Other long term (current) drug therapy: Secondary | ICD-10-CM | POA: Insufficient documentation

## 2024-10-09 DIAGNOSIS — I130 Hypertensive heart and chronic kidney disease with heart failure and stage 1 through stage 4 chronic kidney disease, or unspecified chronic kidney disease: Secondary | ICD-10-CM | POA: Insufficient documentation

## 2024-10-09 HISTORY — PX: IR IMAGING GUIDED PORT INSERTION: IMG5740

## 2024-10-09 MED ORDER — LIDOCAINE-EPINEPHRINE 1 %-1:100000 IJ SOLN
INTRAMUSCULAR | Status: AC
  Filled 2024-10-09: qty 1

## 2024-10-09 MED ORDER — LIDOCAINE-EPINEPHRINE 1 %-1:100000 IJ SOLN
20.0000 mL | Freq: Once | INTRAMUSCULAR | Status: AC
  Administered 2024-10-09: 20 mL

## 2024-10-09 MED ORDER — SODIUM CHLORIDE 0.9 % IV SOLN: INTRAVENOUS | Status: DC

## 2024-10-09 MED ORDER — MIDAZOLAM HCL (PF) 2 MG/2ML IJ SOLN
INTRAMUSCULAR | Status: AC | PRN
  Administered 2024-10-09: 1 mg via INTRAVENOUS
  Administered 2024-10-09: .5 mg via INTRAVENOUS

## 2024-10-09 MED ORDER — HEPARIN SOD (PORK) LOCK FLUSH 100 UNIT/ML IV SOLN
INTRAVENOUS | Status: AC
  Filled 2024-10-09: qty 5

## 2024-10-09 MED ORDER — FENTANYL CITRATE (PF) 100 MCG/2ML IJ SOLN
INTRAMUSCULAR | Status: AC | PRN
  Administered 2024-10-09: 50 ug via INTRAVENOUS
  Administered 2024-10-09: 25 ug via INTRAVENOUS

## 2024-10-09 MED ORDER — FENTANYL CITRATE (PF) 100 MCG/2ML IJ SOLN
INTRAMUSCULAR | Status: AC
  Filled 2024-10-09: qty 2

## 2024-10-09 MED ORDER — HEPARIN SOD (PORK) LOCK FLUSH 100 UNIT/ML IV SOLN
500.0000 [IU] | Freq: Once | INTRAVENOUS | Status: AC
  Administered 2024-10-09: 500 [IU] via INTRAVENOUS

## 2024-10-09 MED ORDER — MIDAZOLAM HCL 2 MG/2ML IJ SOLN
INTRAMUSCULAR | Status: AC
  Filled 2024-10-09: qty 4

## 2024-10-09 NOTE — Telephone Encounter (Signed)
 Spoke with [name] (daughter) in regardto her FMLA forms being completed, faxed and confirmation received. Ms. Marcos) copy was emailed upon request. No questions or concerns to be noted.

## 2024-10-09 NOTE — Transitions of Care (Post Inpatient/ED Visit) (Signed)
   10/09/2024  Name: Carolyn Wells MRN: [id number] DOB: 05-Jul-1941  Today's TOC FU Call Status: Today's TOC FU Call Status:: Unsuccessful Call (1st Attempt) Unsuccessful Call (1st Attempt) Date: 10/09/24  Attempted to reach the patient regarding the most recent Inpatient visit.  Left HIPAA compliant voice message   Follow Up Plan: Additional outreach attempts will be made to reach the patient to complete the Transitions of Care (Post Inpatient/ED visit) call.   Pls call/ message for questions,  Lashayla Armes Mckinney Ambriana Selway, RN, BSN, CCRN Alumnus RN Care Manager  Transitions of Care  [facility] [phone]: direct office

## 2024-10-09 NOTE — Consult Note (Signed)
 Chief Complaint: Request for port insertion for chemotherapy  Referring Provider(s): Dr. Sherrod Sherrod  Supervising Physician: Dr. Ester Sides  Patient Status: [facility] - [facility]  History of Present Illness: Carolyn Wells is a 83 y.o. female history significant for A-fib (Xarelto ), CHF, metastatic lung cancer with recurrent right pleural effusions.  She is known to IR from previous thoracenteses, most recently 09/28/2024 (750 mL), 09/14/2024 (950 mL).  She underwent chest x-ray during recent admission with evidence of fluid reaccumulation adequate for PleurX drain placement with this being completed 11/26. She subsequently experienced pleurisy affecting quality of life, suspected intercostal nerve irritation. The drain was ultimately removed by pulmonologist at bedside per patient request.   Today, patient returns as outpatient for port insertion for chemo (no radiation planned) for metastatic lung cancer.   Confirms NPO since [place] and has a driver available.    Allergies Reviewed:  Budesonide, Elavil  [amitriptyline ], Gabapentin , Tramadol , Hydrocodone, and Ipratropium   Patient is DNR for procedure. Discussed at length. Today, she would like to maintain her current code status.   Past Medical History:  Diagnosis Date   ABDOMINAL INCISIONAL HERNIA 08/13/2009   s/p repair   Allergic rhinitis, cause unspecified 09/28/2011   BRADYCARDIA 09/26/2010   CKD (chronic kidney disease), stage III (HCC) 02/26/2015   Solitary kidney   COLONIC POLYPS, HX OF 08/25/2007   Complication of anesthesia    DIVERTICULOSIS, COLON 08/25/2007   Dysrhythmia    GASTROESOPHAGEAL REFLUX DISEASE 08/04/2007   HYPERLIPIDEMIA 09/27/2010   not on Rx therapy   HYPERTENSION 08/06/2007   Hypothyroidism    INTERNAL HEMORRHOIDS 08/04/2007   LOW BACK PAIN 08/06/2007   scoliosis; DJD; gets injections   Lung cancer (HCC)    OSTEOPENIA 08/04/2007   PAF (paroxysmal atrial fibrillation) (HCC)    a.  Echo 2/17:   Vigorous LVF, EF 65-70%, no RWMA, Ao sclerosis, trivial AI, trivial MR, trivial TR, PASP 23 mmHg   PONV (postoperative nausea and vomiting)    Solitary kidney, acquired 09/28/2011   donated R kidney to husband    Past Surgical History:  Procedure Laterality Date   CARDIOVERSION [name] 08/30/2024   Procedure: CARDIOVERSION;  Surgeon: Francyne Headland, MD;  Location: [facility];  Service: Cardiovascular;  Laterality: [name];   IR PERC PLEURAL DRAIN W/INDWELL CATH W/IMG GUIDE  10/04/2024   IR THORACENTESIS ASP PLEURAL SPACE W/IMG GUIDE  09/06/2024   IR THORACENTESIS ASP PLEURAL SPACE W/IMG GUIDE  09/14/2024   IR THORACENTESIS ASP PLEURAL SPACE W/IMG GUIDE  09/28/2024   LAMINECTOMY  1999   L4-5/lumbar   NEPHRECTOMY     Right   s/o incisional hernia repair     RUQ   STRABISMUS SURGERY Bilateral 04/14/2019   Procedure: STRABISMUS REPAIR BILATERAL;  Surgeon: Neysa Fallow, MD;  Location: [facility];  Service: Ophthalmology;  Laterality: Bilateral;   TONSILLECTOMY        Medications: Prior to Admission medications   Medication Sig Start Date End Date Taking? Authorizing Provider  acetaminophen  (TYLENOL ) 500 MG tablet Take 500 mg by mouth daily as needed for moderate pain (pain score 4-6) or mild pain (pain score 1-3).   Yes [provider]  ALPRAZolam  (XANAX ) 1 MG tablet Take 1 mg by mouth at bedtime.   Yes [provider]  amLODipine  (NORVASC ) 2.5 MG tablet TAKE 1 TABLET BY MOUTH IN THE MORNING AND AT BEDTIME 09/04/24  Yes Segal, Jared E, DO  Ascorbic Acid (VITAMIN C) 1000 MG tablet Take  1,000 mg by mouth daily.   Yes [provider]  furosemide  (LASIX ) 20 MG tablet Take 1 tablet (20 mg total) by mouth every other day. 09/14/24  Yes Segal, Jared E, DO  levothyroxine  (SYNTHROID ) 50 MCG tablet TAKE 1 TABLET BY MOUTH ONCE DAILY BEFORE BREAKFAST APPOINTMENT  REQUIRED  FOR  FUTURE  REFILLS 09/04/24  Yes [name], MD  LUMIGAN 0.01 % SOLN Place 1  drop into both eyes at bedtime.  08/08/15  Yes [provider]  metoprolol  tartrate (LOPRESSOR ) 25 MG tablet Take 3 tablets (75 mg total) by mouth daily. 02/24/24  Yes [name], MD  Multiple Vitamins-Minerals (PRESERVISION AREDS 2 PO) Take 1 tablet by mouth daily.   Yes [provider]  Multiple Vitamins-Minerals (WOMENS 50+ MULTI VITAMIN/MIN PO) Take 1 tablet by mouth daily.   Yes [provider]  Omega-3 Fatty Acids (FISH OIL TRIPLE STRENGTH) 1400 MG CAPS Take 1,400 mg by mouth daily.   Yes [provider]  pantoprazole  (PROTONIX ) 40 MG tablet Take 1 tablet (40 mg total) by mouth daily. TAKE 1 TABLET BY MOUTH ONCE DAILY Patient taking differently: Take 40 mg by mouth daily. 05/08/24  Yes [name], MD  Perfluorohexyloctane (MIEBO) 1.338 GM/ML SOLN Place 1.338 mLs into both eyes 4 (four) times daily.   Yes [provider]  potassium chloride  (KLOR-CON ) 10 MEQ tablet Take 10 mEq by mouth every other day. 09/14/24  Yes [provider]  Rivaroxaban  (XARELTO ) 15 MG TABS tablet TAKE 1 TABLET BY MOUTH ONCE DAILY WITH SUPPER 05/08/24  Yes Nahser, Aleene PARAS, MD  vitamin k 100 MCG tablet Take 100 mcg by mouth daily.   Yes [provider]  lidocaine -prilocaine  (EMLA ) cream Apply to affected area once Patient not taking: Reported on 10/02/2024 09/26/24   Sherrod Sherrod, MD  ondansetron  (ZOFRAN ) 8 MG tablet Take 1 tablet (8 mg total) by mouth every 8 (eight) hours as needed for nausea or vomiting. Start on the third day after carboplatin. Patient not taking: Reported on 10/02/2024 09/26/24   Sherrod Sherrod, MD  prochlorperazine  (COMPAZINE ) 10 MG tablet Take 1 tablet (10 mg total) by mouth every 6 (six) hours as needed for nausea or vomiting. Patient not taking: Reported on 10/02/2024 09/26/24   Sherrod Sherrod, MD     Family History  Problem Relation Age of Onset   Coronary artery disease Mother 69       1st degree relative     Diabetes Mother    Heart attack Father 43   Diabetes Other        1st degree relative   Alzheimer's disease Brother     Social History   Socioeconomic History   Marital status: Married    Spouse name: Not on file   Number of children: 2   Years of education: Not on file   Highest education level: Not on file  Occupational History   Occupation: retired Passenger transport manager co.    Employer: RETIRED  Tobacco Use   Smoking status: Never    Passive exposure: Never   Smokeless tobacco: Never  Substance and Sexual Activity   Alcohol use: No    Alcohol/week: 0.0 standard drinks of alcohol   Drug use: No   Sexual activity: Not on file  Other Topics Concern   Not on file  Social History Narrative   Husband disabled, renal failure, DM, HTN.   Originally from [place], [place] >> came to [place]  1966 (husband in Electronics Engineer)  Social Drivers of Corporate Investment Banker Strain: Low Risk  (03/22/2024)   Received [facility]   Overall Financial Resource Strain (CARDIA)    Difficulty of Paying Living Expenses: Not hard at all  Food Insecurity: No Food Insecurity (10/03/2024)   Hunger Vital Sign    Worried About Running Out of Food in the Last Year: Never true    Ran Out of Food in the Last Year: Never true  Transportation Needs: No Transportation Needs (10/03/2024)   PRAPARE - Administrator, Civil Service (Medical): No    Lack of Transportation (Non-Medical): No  Physical Activity: Sufficiently Active (02/09/2023)   Exercise Vital Sign    Days of Exercise per Week: 5 days    Minutes of Exercise per Session: 30 min  Stress: No Stress Concern Present (02/09/2023)   [employer] of Occupational Health - Occupational Stress Questionnaire    Feeling of Stress : Not at all  Social Connections: Moderately Integrated (10/03/2024)   Social Connection and Isolation Panel    Frequency of Communication with Friends and Family: Three times a week    Frequency of Social  Gatherings with Friends and Family: Three times a week    Attends Religious Services: More than 4 times per year    Active Member of Clubs or Organizations: No    Attends Banker Meetings: More than 4 times per year    Marital Status: Widowed     Review of Systems: A 12 point ROS discussed and pertinent positives are indicated in the HPI above.  All other systems are negative.  Review of Systems  Constitutional:  Positive for fatigue. Negative for chills and fever.  Respiratory:  Negative for shortness of breath.   Cardiovascular:  Negative for chest pain and leg swelling.  Gastrointestinal:  Positive for constipation. Negative for blood in stool, nausea and vomiting.  Genitourinary:  Negative for hematuria.    Vital Signs: BP (!) 140/77   Pulse (!) 111   Temp (!) 97.5 F (36.4 C) (Oral)   Resp 20   Ht 5' 6 (1.676 m)   Wt 133 lb (60.3 kg)   SpO2 92%   BMI 21.47 kg/m   Advance Care Plan: No documents on file   Physical Exam HENT:     Mouth/Throat:     Mouth: Mucous membranes are moist.     Pharynx: Oropharynx is clear.  Cardiovascular:     Rate and Rhythm: Normal rate. Rhythm irregular.     Pulses: Normal pulses.  Pulmonary:     Effort: Pulmonary effort is normal.     Comments: Dressing over previous R pleur x site is C/D/I, no longer leaking from skin per family.  Abdominal:     General: There is no distension.     Palpations: Abdomen is soft.     Tenderness: There is no abdominal tenderness.  Skin:    General: Skin is warm and dry.  Neurological:     Mental Status: She is alert and oriented to person, place, and time.  Psychiatric:        Mood and Affect: Mood normal.        Behavior: Behavior normal.     Imaging: DG Chest Port 1 View Result Date: 10/07/2024 CLINICAL DATA:  Pleural effusion.  Lung carcinoma. EXAM: PORTABLE CHEST 1 VIEW COMPARISON:  10/06/2024 FINDINGS: Heart size remains within normal limits. Atelectasis or infiltrate in  left lung base and tiny left pleural effusion show  no significant change. No pneumothorax visualized. Pulmonary hyperinflation again seen, consistent with COPD. 4.6 cm mass in the medial right upper lobe is unchanged. IMPRESSION: No significant change in left basilar atelectasis or infiltrate, and tiny left pleural effusion. Stable right upper lobe mass. COPD. Electronically Signed   By: Norleen DELENA Kil M.D.   On: 10/07/2024 07:24   DG Chest Port 1 View Result Date: 10/06/2024 CLINICAL DATA:  142230 Pleural effusion 142230 EXAM: PORTABLE CHEST - 1 VIEW COMPARISON:  October 06, 2026 FINDINGS: Redemonstrated medial right apical mass measuring 4.3 cm, unchanged. Trace right pleural effusion. Interval removal of the right-sided thoracostomy tube. No pneumothorax. No new airspace consolidation. Trace left pleural effusion. No cardiomegaly. Tortuous aorta. No acute fracture or destructive lesions. Multilevel thoracic osteophytosis. Osteopenia. IMPRESSION: 1. Interval removal of the right-sided thoracostomy tube. No pneumothorax. 2. Similar trace bilateral pleural effusions. Electronically Signed   By: Rogelia Myers M.D.   On: 10/06/2024 18:00   DG Chest Port 1 View Result Date: 10/06/2024 CLINICAL DATA:  Pleural effusion. History of lung cancer and recent placement of a PleurX catheter. EXAM: PORTABLE CHEST 1 VIEW COMPARISON:  10/04/2024 and chest CT 10/05/2024 FINDINGS: Right chest PleurX catheter is stable with the tip near the right lung apex. Right pneumothorax is not confidently identified. Again noted is the mass in the medial right upper lung. Mild haziness at the right lung base may represent some re-accumulation of pleural fluid. Persistent densities at left lung base compatible with small left pleural effusion. Heart size is stable. IMPRESSION: 1. Stable position of the right chest PleurX catheter. Right pneumothorax is not confidently identified. 2. Mild haziness at the right lung base may represent  some re-accumulation of pleural fluid. 3. Stable small left pleural effusion. Electronically Signed   By: Juliene Balder M.D.   On: 10/06/2024 08:30   CT CHEST WO CONTRAST Result Date: 10/05/2024 CLINICAL DATA:  History of lung cancer. Chest pain. * Tracking Code: BO * EXAM: CT CHEST WITHOUT CONTRAST TECHNIQUE: Multidetector CT imaging of the chest was performed following the standard protocol without IV contrast. RADIATION DOSE REDUCTION: This exam was performed according to the departmental dose-optimization program which includes automated exposure control, adjustment of the mA and/or kV according to patient size and/or use of iterative reconstruction technique. COMPARISON:  Chest radiograph dated 10/04/2024, CT chest dated 09/05/2024, nuclear medicine PET dated 09/19/2024 FINDINGS: Cardiovascular: Multichamber cardiomegaly. No significant pericardial fluid/thickening. Unchanged dilated ascending thoracic aorta measures 4.0 x 4.0 cm. Dilated main pulmonary artery measures 3.7 cm. Coronary artery calcifications and aortic atherosclerosis. Mediastinum/Nodes: Imaged thyroid  gland without nodules meeting criteria for imaging follow-up by size. Small hiatal hernia. Unchanged multi station lymphadenopathy, including 14 mm right subpectoral (3:31, 14 mm high left paratracheal (3:22), 19 mm precarinal (3:65), and 11 mm right hilar (3:75, remeasured). Lungs/Pleura: The central airways are patent. Mild centrilobular and paraseptal emphysema. 4.8 x 3.5 cm medial right apical mass (4:37), not substantially changed. Unchanged inferior right upper lobe ground-glass nodules measuring up to 7 mm (4:71) and solid right lower lobe nodules measuring up to 5 mm (4:104, 107). Triangular 4 x 3 mm right apical nodule (4:30) is new compared to 09/05/2024. Unchanged small foci of tree-in-bud nodules within the medial left upper lobe (4:66). Subsegmental right middle lobe and lingular atelectasis, unchanged. Increased left lower lobe  relaxation atelectasis. Interval resolution of previously noted anterior left lower lobe consolidation. Scattered calcified pulmonary nodules and subsegmental mucous plugging. Unchanged trace right apical pneumothorax status post placement of  right inferolateral approach pleural catheter with tip terminating at the level of the lateral apex. Decreased trace right pleural effusion. Slightly increased small left pleural effusion. Upper abdomen: Normal. Musculoskeletal: No acute or abnormal lytic or blastic osseous lesions. Multilevel degenerative changes of the thoracic spine. Trace right lateral chest wall subcutaneous emphysema at the site of catheter insertion. IMPRESSION: 1. Unchanged trace right apical pneumothorax status post placement of right inferolateral approach pleural catheter with tip terminating at the level of the lateral apex. Decreased trace right pleural effusion. 2. Slightly increased small left pleural effusion with increased left lower lobe relaxation atelectasis. 3. Unchanged 4.8 cm medial right apical mass and multistation lymphadenopathy. 4. New triangular 4 x 3 mm right apical nodule, which may be infectious/inflammatory. 5. Unchanged dilated ascending thoracic aorta measures 4.0 x 4.0 cm. Recommend annual imaging followup by CTA or MRA. This recommendation follows 2010 ACCF/AHA/AATS/ACR/ASA/SCA/SCAI/SIR/STS/SVM Guidelines for the Diagnosis and Management of Patients with Thoracic Aortic Disease. Circulation. 2010; 121: Z733-z630. Aortic aneurysm NOS (ICD10-I71.9) 6. Dilated main pulmonary artery measures 3.7 cm, which can be seen in the setting of pulmonary arterial hypertension. Aortic Atherosclerosis (ICD10-I70.0) and Emphysema (ICD10-J43.9). Coronary artery calcifications. Assessment for potential risk factor modification, dietary therapy or pharmacologic therapy may be warranted, if clinically indicated. Electronically Signed   By: Limin  Xu M.D.   On: 10/05/2024 13:18   DG Chest Port  1 View Result Date: 10/04/2024 CLINICAL DATA:  Lung cancer and recent placement of right chest PleurX catheter. EXAM: PORTABLE CHEST 1 VIEW COMPARISON:  Chest radiograph 10/02/2024 FINDINGS: PleurX catheter in the right chest with the tip near the apex. Evidence for small right apical pneumothorax. Right pleural effusion has been removed. Markedly improved aeration in the right lung. Patient has a mass along the medial aspect of the right upper lung measuring up to 4.2 cm. Persistent densities at the left lung base compatible with left pleural fluid. Heart size is stable and within normal limits. IMPRESSION: 1. Placement of a right chest PleurX catheter. Right pleural effusion has essentially resolved. Evidence for a small right apical pneumothorax. 2. Small left pleural effusion. 3. Right lung mass. Electronically Signed   By: Juliene Balder M.D.   On: 10/04/2024 19:55   IR PERC PLEURAL DRAIN W/INDWELL CATH W/IMG GUIDE Result Date: 10/04/2024 INDICATION: 83 year old with lung cancer and recurrent right pleural effusion. EXAM: PLACEMENT OF TUNNELED PLEURAL DRAINAGE CATHETER WITH ULTRASOUND AND FLUOROSCOPIC GUIDANCE MEDICATIONS: Ancef  2 g ANESTHESIA/SEDATION: Moderate (conscious) sedation was employed during this procedure. A total of Versed  1.5 mg and Fentanyl  50 mcg was administered intravenously by the radiology nurse. Total intra-service moderate Sedation Time: 15 minutes. The patient's level of consciousness and vital signs were monitored continuously by radiology nursing throughout the procedure under my direct supervision. COMPLICATIONS: None immediate. FLUOROSCOPY: Radiation Exposure Index (as provided by the fluoroscopic device): 1 mGy Kerma PROCEDURE: Informed written consent was obtained from the patient after a thorough discussion of the procedural risks, benefits and alternatives. All questions were addressed. Maximal Sterile Barrier Technique was utilized including caps, mask, sterile gowns, sterile  gloves, sterile drape, hand hygiene and skin antiseptic. A timeout was performed prior to the initiation of the procedure. Ultrasound demonstrated an adequate amount of right pleural fluid. The right side of the chest was prepped and draped in sterile fashion. Skin was anesthetized with 1% lidocaine  into two spots. Two small incisions were made. Using ultrasound guidance, a thoracentesis catheter was directed into the pleural fluid along the more lateral incision. Fluid  was aspirated from the catheter. PleurX catheter was tunneled between the 2 incisions. The cuff was placed underneath the skin. Wire was placed through the thoracentesis catheter and wire was advanced into the pleural space under fluoroscopy. Catheter was removed over the wire and the tract was dilated to accommodate a peel-away sheath. PleurX was placed through the peel-away sheath and advanced into the pleural space. Pleural fluid was removed from the PleurX catheter. The thoracentesis skin access site was closed using absorbable suture and Dermabond. Catheter was secured to skin with suture. Dressing was placed. Fluoroscopic and ultrasound images were taken and saved for documentation. FINDINGS: 1.7 L of yellow pleural fluid was removed. IMPRESSION: Successful placement of a tunneled right pleural drainage catheter using ultrasound and fluoroscopic guidance. Electronically Signed   By: Juliene Balder M.D.   On: 10/04/2024 19:49   DG Chest Portable 1 View Result Date: 10/02/2024 CLINICAL DATA:  Shortness of breath. EXAM: PORTABLE CHEST 1 VIEW COMPARISON:  Chest radiograph dated 09/28/2024. FINDINGS: Bilateral pleural effusions, right greater than left, and increased since the prior radiograph. There is bibasilar atelectasis or pneumonia. No pneumothorax. Stable cardiac silhouette. No acute osseous pathology. IMPRESSION: Bilateral pleural effusions, right greater than left, and increased since the prior radiograph. Electronically Signed   By: Vanetta Chou M.D.   On: 10/02/2024 13:14   IR THORACENTESIS ASP PLEURAL SPACE W/IMG GUIDE Result Date: 09/28/2024 INDICATION: History of right lung cancer with recurrent right pleural effusion. Request for therapeutic right thoracentesis. EXAM: ULTRASOUND GUIDED THERAPEUTIC RIGHT THORACENTESIS MEDICATIONS: 7 mL 1% lidocaine  COMPLICATIONS: None immediate. PROCEDURE: An ultrasound guided thoracentesis was thoroughly discussed with the patient and questions answered. The benefits, risks, alternatives and complications were also discussed. The patient understands and wishes to proceed with the procedure. Written consent was obtained. Ultrasound was performed to localize and mark an adequate pocket of fluid in the right chest. The area was then prepped and draped in the normal sterile fashion. 1% Lidocaine  was used for local anesthesia. Under ultrasound guidance a 6 Fr Safe-T-Centesis catheter was introduced. Thoracentesis was performed. The catheter was removed and a dressing applied. FINDINGS: A total of approximately 750 mL of dark yellow fluid was removed. IMPRESSION: Successful ultrasound guided right thoracentesis yielding 750 mL of pleural fluid. Performed and dictated by Kimble Clas, PA-C Electronically Signed   By: Marcey Moan M.D.   On: 09/28/2024 14:21   DG Chest 1 View Result Date: 09/28/2024 CLINICAL DATA:  Follow-up pleural effusion. Known right upper lobe lung cancer. EXAM: CHEST  1 VIEW COMPARISON:  Chest x-ray 09/25/2024, PET-CT 09/19/2024 FINDINGS: Lungs are somewhat hypoinflated demonstrate evidence of small bilateral pleural effusions likely with associated bibasilar atelectasis without significant change. Stable known right lung cancer over the right upper lobe/right paramediastinal region. Cardiac silhouette is normal. Remainder of the exam is unchanged. IMPRESSION: 1. Stable small bilateral pleural effusions with associated bibasilar atelectasis. 2. Stable known right upper lobe lung  cancer. Electronically Signed   By: Toribio Agreste M.D.   On: 09/28/2024 14:06   DG Chest 2 View Result Date: 09/25/2024 EXAM: 2 VIEW(S) XRAY OF THE CHEST 09/25/2024 12:37:00 PM COMPARISON: Paracin colon 09/20/2024. CLINICAL HISTORY: chest pain and shortness of breath with Hx of pleural effusions and lung cancer FINDINGS: LUNGS AND PLEURA: Stable bilateral pleural effusions. Bibasilar airspace opacities primarily representing atelectasis; a component of pneumonia cannot be excluded. Right upper lobe paramediastinal mass noted unchanged. Low lung volumes are present, causing crowding of the pulmonary vasculature. No pneumothorax. HEART  AND MEDIASTINUM: No acute abnormality of the cardiac and mediastinal silhouettes. BONES AND SOFT TISSUES: No acute osseous abnormality. IMPRESSION: 1. Stable bilateral pleural effusions and bibasilar airspace opacities, primarily representing atelectasis. Pneumonia cannot be excluded. 2. Unchanged right upper lobe paramediastinal mass. 3. Low lung volumes causing crowding of the pulmonary vasculature. Electronically signed by: Ryan Salvage MD 09/25/2024 01:42 PM EST RP Workstation: [id number]   ECHOCARDIOGRAM COMPLETE Result Date: 09/22/2024    ECHOCARDIOGRAM REPORT   Patient Name:   EVELYNNE SPIERS Date of Exam: 09/22/2024 Medical Rec #:  [id number]         Height:       66.0 in Accession #:    [id number]        Weight:       143.6 lb Date of Birth:  26-Aug-1941         BSA:          1.737 m Patient Age:    83 years          BP:           132/80 mmHg Patient Gender: F                 HR:           80 bpm. Exam Location:  [address] Procedure: 2D Echo, Cardiac Doppler and Color Doppler (Both Spectral and Color            Flow Doppler were utilized during procedure). Indications:    I48.91 Atrial fibrillation  History:        Patient has prior history of Echocardiogram examinations, most                 recent 01/07/2023. Pleural effusion, Lung cancer,                  Arrythmias:Atrial Fibrillation, Signs/Symptoms:Shortness of                 Breath; Risk Factors:Hypertension and Dyslipidemia.  Sonographer:    Elsie Bohr RDCS Referring Phys: [id number] JARED E SEGAL IMPRESSIONS  1. Left ventricular ejection fraction, by estimation, is 55 to 60%. The left ventricle has normal function. The left ventricle has no regional wall motion abnormalities. There is mild concentric left ventricular hypertrophy. Left ventricular diastolic function could not be evaluated.  2. Right ventricular systolic function is normal. The right ventricular size is normal.  3. Left atrial size was mildly dilated.  4. Right atrial size was mildly dilated.  5. Moderate pleural effusion in the left lateral region.  6. The mitral valve is normal in structure. Trivial mitral valve regurgitation. No evidence of mitral stenosis.  7. The aortic valve is tricuspid. Aortic valve regurgitation is mild. Aortic valve sclerosis is present, with no evidence of aortic valve stenosis.  8. The inferior vena cava is normal in size with greater than 50% respiratory variability, suggesting right atrial pressure of 3 mmHg. FINDINGS  Left Ventricle: Left ventricular ejection fraction, by estimation, is 55 to 60%. The left ventricle has normal function. The left ventricle has no regional wall motion abnormalities. The left ventricular internal cavity size was normal in size. There is  mild concentric left ventricular hypertrophy. Left ventricular diastolic function could not be evaluated due to atrial fibrillation. Left ventricular diastolic function could not be evaluated. Right Ventricle: The right ventricular size is normal. Right ventricular systolic function is normal. Left Atrium: Left atrial size was mildly dilated. Right Atrium: Right atrial size was  mildly dilated. Pericardium: Trivial pericardial effusion is present. Mitral Valve: The mitral valve is normal in structure. Trivial mitral valve regurgitation. No evidence  of mitral valve stenosis. Tricuspid Valve: The tricuspid valve is normal in structure. Tricuspid valve regurgitation is trivial. No evidence of tricuspid stenosis. Aortic Valve: The aortic valve is tricuspid. Aortic valve regurgitation is mild. Aortic regurgitation PHT measures 571 msec. Aortic valve sclerosis is present, with no evidence of aortic valve stenosis. Pulmonic Valve: The pulmonic valve was normal in structure. Pulmonic valve regurgitation is trivial. No evidence of pulmonic stenosis. Aorta: The aortic root is normal in size and structure. Venous: The inferior vena cava is normal in size with greater than 50% respiratory variability, suggesting right atrial pressure of 3 mmHg. IAS/Shunts: No atrial level shunt detected by color flow Doppler. Additional Comments: There is a moderate pleural effusion in the left lateral region.  LEFT VENTRICLE PLAX 2D LVIDd:         4.30 cm LVIDs:         2.90 cm LV PW:         1.20 cm LV IVS:        1.20 cm LVOT diam:     1.90 cm LV SV:         48 LV SV Index:   28 LVOT Area:     2.84 cm  IVC IVC diam: 1.90 cm LEFT ATRIUM             Index        RIGHT ATRIUM           Index LA diam:        4.40 cm 2.53 cm/m   RA Area:     13.90 cm LA Vol (A2C):   46.9 ml 27.00 ml/m  RA Volume:   38.60 ml  22.22 ml/m LA Vol (A4C):   46.5 ml 26.77 ml/m LA Biplane Vol: 47.3 ml 27.23 ml/m  AORTIC VALVE LVOT Vmax:   84.60 cm/s LVOT Vmean:  54.720 cm/s LVOT VTI:    0.171 m AI PHT:      571 msec  AORTA Ao Root diam: 3.10 cm Ao Asc diam:  3.50 cm MITRAL VALVE                TRICUSPID VALVE MV Area (PHT): 4.16 cm     TR Peak grad:   32.9 mmHg MV Decel Time: 182 msec     TR Vmax:        287.00 cm/s MV E velocity: 114.40 cm/s                             SHUNTS                             Systemic VTI:  0.17 m                             Systemic Diam: 1.90 cm Redell Shallow MD Electronically signed by Redell Shallow MD Signature Date/Time: 09/22/2024/8:57:10 AM    Final    DG Chest 2  View Result Date: 09/20/2024 EXAM: 2 VIEW(S) XRAY OF THE CHEST 09/20/2024 10:58:24 AM COMPARISON: 09/17/2024 CLINICAL HISTORY: pleural effusion FINDINGS: LUNGS AND PLEURA: Similarly appearing, right upper lobe mass abutting the mediastinum. Small left pleural effusion. Streaky opacities in both lung bases, likely atelectasis. No  pulmonary edema. No pneumothorax. HEART AND MEDIASTINUM: The right upper lobe mass abuts the mediastinum. No acute abnormality of the cardiac silhouette. BONES AND SOFT TISSUES: No acute osseous abnormality. IMPRESSION: 1. Streaky bibasilar airspace opacities, likely atelectasis. Trace left pleural effusion. 2. Right upper lobe mass abutting the mediastinum, similar to prior exam. Electronically signed by: Rogelia Myers MD 09/20/2024 11:31 AM EST RP Workstation: [id number]   NM PET Image Initial (PI) Skull Base To Thigh Result Date: 09/20/2024 CLINICAL DATA:  Initial treatment strategy for non-small cell lung cancer. EXAM: NUCLEAR MEDICINE PET SKULL BASE TO THIGH TECHNIQUE: 7.1 mCi F-18 FDG was injected intravenously. Full-ring PET imaging was performed from the skull base to thigh after the radiotracer. CT data was obtained and used for attenuation correction and anatomic localization. Fasting blood glucose: 89 mg/dl COMPARISON:  CT chest 89/71/7974. FINDINGS: Mediastinal blood pool activity: SUV max 2.8 Liver activity: SUV max NA NECK: No abnormal hypermetabolism. Incidental CT findings: None. CHEST: Hypermetabolic supraclavicular, mediastinal, left hilar, right axillary, right internal mammary and prepericardiac lymph nodes. Index AP window lymph node measures 1.4 cm (4/68), SUV max 8.4. Medial right upper lobe mass has a long border of contact with the adjacent mediastinum and measures 3.0 x 4.4 cm with SUV max 12.1. Hypermetabolic nodular consolidation in the anterolateral left lower lobe measures approximately 10 mm (7/59), SUV max 3.1. No additional abnormal hypermetabolism.  Incidental CT findings: Atherosclerotic calcification of the aorta, aortic valve and coronary arteries. Heart is enlarged. No pericardial effusion. Moderate right and small left pleural effusions. Volume loss in the lateral segment right middle lobe and inferior lingula. ABDOMEN/PELVIS: No abnormal hypermetabolism. Incidental CT findings: Right kidney is absent. Low-attenuation lesion in the left kidney. No specific follow-up necessary. SKELETON: No abnormal hypermetabolism. Incidental CT findings: Degenerative changes in the spine. IMPRESSION: 1. Hypermetabolic right upper lobe mass with hypermetabolic adenopathy extending to the left supraclavicular region, findings compatible with at least T2bN3M0 or stage IIIB primary bronchogenic carcinoma. Hypermetabolic nodular consolidation in the anterior left lower lobe may be infectious/inflammatory in etiology. Metastatic disease or stage IVA disease cannot be definitively excluded. 2. Moderate right and small left pleural effusions. 3. Aortic atherosclerosis (ICD10-I70.0). Coronary artery calcification. Electronically Signed   By: Newell Eke M.D.   On: 09/20/2024 11:10   MR BRAIN W WO CONTRAST Result Date: 09/20/2024 CLINICAL DATA:  Non-small cell lung cancer.  Staging. EXAM: MRI HEAD WITHOUT AND WITH CONTRAST TECHNIQUE: Multiplanar, multiecho pulse sequences of the brain and surrounding structures were obtained without and with intravenous contrast. CONTRAST:  6.5mL GADAVIST  GADOBUTROL  1 MMOL/ML IV SOLN COMPARISON:  Head CT 06/27/2023 FINDINGS: Brain: No focal finding affects the brainstem or cerebellum. Mild cerebellar volume loss, age related. Cerebral hemispheres show age related volume loss without subjective lobar predominance. There is a background pattern of mild chronic small-vessel ischemic change of the deep white matter. There are 3 subcentimeter foci of restricted diffusion in the right frontal lobe. Two of these are punctate and the larger focus  in the medial inferior frontal lobe measures up to 8 mm. There is no edema or enhancement associated with these foci and they are favored to represent small embolic strokes. Nonenhancing metastases are possible but less likely. The clustered nature favors vascular pathology. No evidence of any other brain metastasis. There is an incidental meningioma measuring 1.8 cm in diameter at the frontoparietal vertex on the left. Minimal indentation of the surface of the brain but no significant mass effect. Broad surface along the superior  sagittal sinus but without evidence of sinus invasion or occlusion. No hydrocephalus or extra-axial collection. Vascular: Major vessels at the base of the brain show flow. Skull and upper cervical spine: Negative Sinuses/Orbits: Clear/normal Other: None IMPRESSION: 1. 3 subcentimeter foci of restricted diffusion in the right frontal lobe. Two of these are punctate and the larger focus in the medial inferior frontal lobe measures up to 8 mm. There is no edema or enhancement associated with these foci and they are favored to represent recent small embolic strokes. Nonenhancing metastases are possible but less likely. The clustered nature favors vascular pathology. 2. No evidence of any other brain metastasis. 3. 1.8 cm meningioma at the frontoparietal vertex on the left. Minimal indentation of the surface of the brain but no significant mass effect. Broad surface along the superior sagittal sinus but without evidence of sinus invasion or occlusion. No apparent change compared to the CT scan August 2024. Electronically Signed   By: Oneil Officer M.D.   On: 09/20/2024 11:00   DG Chest 1 View Result Date: 09/18/2024 CLINICAL DATA:  Status post right thoracentesis EXAM: CHEST  1 VIEW COMPARISON:  Chest x-ray performed September 06, 2024 FINDINGS: No pneumothorax. Improvement in aeration of the right lung with trace residual pleural fluid. Heart size is unchanged. Right upper lobe lesion is  unchanged. Degenerative changes in the imaged osseous structures. IMPRESSION: 1. Improvement in aeration of the right lung without pneumothorax. 2. Stable appearance of right lung lesion. Electronically Signed   By: Maude Naegeli M.D.   On: 09/18/2024 07:42   DG Chest 2 View Result Date: 09/17/2024 EXAM: 2 VIEW(S) XRAY OF THE CHEST 09/17/2024 11:59:09 AM COMPARISON: 09/14/2024 CLINICAL HISTORY: sob, cp FINDINGS: LUNGS AND PLEURA: Lateral view degraded by patient arm position, not raised above the head. Hyperinflation. Increased right and persistent left base airspace disease. Slight increase in small right pleural effusion. Trace left pleural fluid. No pulmonary edema. No pneumothorax. HEART AND MEDIASTINUM: Mild cardiomegaly. Right paratracheal mass has been detailed previously. BONES AND SOFT TISSUES: Skin fold over the superolateral right hemithorax. No acute osseous abnormality. IMPRESSION: 1. increase in small right and persistent small left pleural effusions, without pneumothorax. 2. Increased right and similar left base airspace disease, likely atelectasis. Electronically signed by: Rockey Kilts MD 09/17/2024 12:33 PM EST RP Workstation: [id number]   IR THORACENTESIS ASP PLEURAL SPACE W/IMG GUIDE Result Date: 09/14/2024 INDICATION: Patient with right upper lobe lung mass and recurrent right pleural effusion. Consult for diagnostic and therapeutic right thoracentesis. Her most recent thoracentesis was 09/06/24 (right, 1 liter output). EXAM: ULTRASOUND GUIDED RIGHT THORACENTESIS MEDICATIONS: 8 mL 1% lidocaine  with epi COMPLICATIONS: None immediate. PROCEDURE: An ultrasound guided thoracentesis was thoroughly discussed with the patient and questions answered. The benefits, risks, alternatives and complications were also discussed. The patient understands and wishes to proceed with the procedure. Written consent was obtained. Ultrasound was performed to localize and mark an adequate pocket of fluid in the  right chest. The area was then prepped and draped in the normal sterile fashion. 1% Lidocaine  was used for local anesthesia. Under ultrasound guidance a 6 Fr Safe-T-Centesis catheter was introduced. Thoracentesis was performed. The catheter was removed and a dressing applied. FINDINGS: A total of approximately 950 milliliters of clear, yellow fluid was removed. Samples were sent to the laboratory as requested by the clinical team. IMPRESSION: Successful ultrasound guided right thoracentesis yielding 950 milliliters of pleural fluid. Performed by Laymon Coast, NP under the supervision of Dr. Jennefer. Electronically Signed  By: Ester Sides M.D.   On: 09/14/2024 13:27   DG Chest 2 View Result Date: 09/12/2024 CLINICAL DATA:  Evaluate pleural effusion. EXAM: CHEST - 2 VIEW COMPARISON:  Chest x-ray 09/06/2024, chest CT 09/05/2024 FINDINGS: Lungs are adequately inflated demonstrate moderate size right pleural effusion with reaccumulation since the previous exam. Small left effusion. Likely associated bibasilar atelectatic change. Known right upper paramediastinal lung mass unchanged. Cardiomediastinal silhouette and remainder of the exam is unchanged. IMPRESSION: 1. Moderate size right pleural effusion with reaccumulation since the previous exam. Small left effusion. Likely associated bibasilar atelectatic change. 2. Known right upper paramediastinal lung mass unchanged. Electronically Signed   By: Toribio Agreste M.D.   On: 09/12/2024 11:52    Labs:  CBC: Recent Labs    10/02/24 1245 10/02/24 1259 10/03/24 1403 10/06/24 0237 10/07/24 0249  WBC 6.9  --  7.0 7.4 8.7  HGB 8.6* 8.8* 9.8* 8.9* 8.9*  HCT 28.5* 26.0* 31.5* 28.3* 28.5*  PLT 356  --  399 320 278    COAGS: No results for input(s): INR, APTT in the last 8760 hours.  BMP: Recent Labs    10/03/24 1403 10/05/24 0300 10/06/24 0237 10/07/24 0249  NA 139 140 141 138  K 4.1 3.7 3.9 4.0  CL 101 100 103 102  CO2 24 27 27 27    GLUCOSE 93 93 89 103*  BUN 19 24* 24* 25*  CALCIUM  9.4 8.9 8.7* 8.7*  CREATININE 1.71* 1.87* 1.78* 1.85*  GFRNONAA 29* 26* 28* 27*    LIVER FUNCTION TESTS: Recent Labs    11/18/23 1340 02/24/24 1353 09/06/24 1359 09/25/24 1157 10/02/24 1245  BILITOT 0.4 0.5  --  0.7 0.7  AST 20 14  --  24 26  ALT 17 11  --  20 23  ALKPHOS 88 94  --  115 119  PROT 7.3 7.1 5.8* 6.4* 6.2*  ALBUMIN 4.0 4.2  --  2.8* 2.9*    TUMOR MARKERS: No results for input(s): AFPTM, CEA, CA199, CHROMGRNA in the last 8760 hours.  Assessment and Plan:  Request for  image guided port insertion approved for 12/1 by Dr. Sides.  No contraindications for procedure identified in ROS, physical exam, or review of pre-sedation considerations. Labs reviewed and within acceptable range from hospitalization. No reason to suspect new infection concern to warrant additional labs today.  VSS, afebrile Patient not asked to hold any AC/AP for this low bleeding risk procedure   Risks and benefits of image guided port-a-catheter placement was discussed with the patient including, but not limited to bleeding, infection, pneumothorax, or fibrin sheath development and need for additional procedures.  All of the patient's questions were answered, patient is agreeable to proceed. Consent signed and in chart.   Thank you for allowing our service to participate in DAVION MEARA 's care.    Electronically Signed: Laymon Coast, NP   10/09/2024, 11:41 AM     I spent a total of 20 Minutes   in face to face in clinical consultation, greater than 50% of which was counseling/coordinating care for image guided port insertion.    (A copy of this note was sent to the referring provider and the time of visit.)

## 2024-10-09 NOTE — Procedures (Signed)
 Interventional Radiology Procedure Note  Procedure: Single Lumen Power Port Placement    Access:  Right internal jugular vein  Findings: Catheter tip positioned at cavoatrial junction. Port is ready for immediate use.   Complications: None  EBL: < 10 mL  Recommendations:  - Ok to shower in 24 hours - Do not submerge for 7 days - Routine line care    Ester Sides, MD

## 2024-10-10 ENCOUNTER — Telehealth: Payer: Self-pay | Admitting: *Deleted

## 2024-10-10 ENCOUNTER — Other Ambulatory Visit: Payer: Self-pay

## 2024-10-10 ENCOUNTER — Inpatient Hospital Stay

## 2024-10-10 ENCOUNTER — Inpatient Hospital Stay: Attending: Internal Medicine

## 2024-10-10 ENCOUNTER — Encounter: Payer: Self-pay | Admitting: Pulmonary Disease

## 2024-10-10 ENCOUNTER — Ambulatory Visit: Admitting: Pulmonary Disease

## 2024-10-10 VITALS — BP 112/74 | HR 68 | Ht 66.0 in | Wt 134.0 lb

## 2024-10-10 DIAGNOSIS — C3490 Malignant neoplasm of unspecified part of unspecified bronchus or lung: Secondary | ICD-10-CM | POA: Diagnosis not present

## 2024-10-10 DIAGNOSIS — D631 Anemia in chronic kidney disease: Secondary | ICD-10-CM | POA: Insufficient documentation

## 2024-10-10 DIAGNOSIS — I480 Paroxysmal atrial fibrillation: Secondary | ICD-10-CM | POA: Diagnosis not present

## 2024-10-10 DIAGNOSIS — N183 Chronic kidney disease, stage 3 unspecified: Secondary | ICD-10-CM | POA: Insufficient documentation

## 2024-10-10 DIAGNOSIS — C3411 Malignant neoplasm of upper lobe, right bronchus or lung: Secondary | ICD-10-CM | POA: Insufficient documentation

## 2024-10-10 DIAGNOSIS — N189 Chronic kidney disease, unspecified: Secondary | ICD-10-CM

## 2024-10-10 DIAGNOSIS — J910 Malignant pleural effusion: Secondary | ICD-10-CM | POA: Diagnosis not present

## 2024-10-10 DIAGNOSIS — D630 Anemia in neoplastic disease: Secondary | ICD-10-CM

## 2024-10-10 DIAGNOSIS — Z79899 Other long term (current) drug therapy: Secondary | ICD-10-CM | POA: Insufficient documentation

## 2024-10-10 DIAGNOSIS — I130 Hypertensive heart and chronic kidney disease with heart failure and stage 1 through stage 4 chronic kidney disease, or unspecified chronic kidney disease: Secondary | ICD-10-CM

## 2024-10-10 DIAGNOSIS — I5033 Acute on chronic diastolic (congestive) heart failure: Secondary | ICD-10-CM

## 2024-10-10 NOTE — Patient Instructions (Addendum)
 Recommend drinking at least 2 boost drinks per day to get your protein in  Continue metoprolol  succinate, check your blood pressure daily before taking medications - hold if your BP is 100/60 or less - hold if your heart rate is 60 or less  Resume Xarelto  as directed this evening  Monitor your left leg pain, if worsens, please let us  know and we will consider ultrasound of the leg to evaluate for blood clots  We will continue to monitor the fluid around the lung and can schedule as needed thoracentesis  Follow up in 3 months, call sooner if needed

## 2024-10-10 NOTE — Transitions of Care (Post Inpatient/ED Visit) (Signed)
 10/10/2024  Name: Carolyn Wells MRN: [id number] DOB: [birth year]  Today's TOC FU Call Status: Today's TOC FU Call Status:: Successful TOC FU Call Completed TOC FU Call Complete Date: 10/10/24  Patient's Name and Date of Birth confirmed. Name, DOB  Transition Care Management Follow-up Telephone Call Date of Discharge: 10/07/24 Discharge Facility: [facility]) Type of Discharge: Inpatient Admission Primary Inpatient Discharge Diagnosis:: Recurrent R pleural effusion / CHF in setting of stage IV lung ca How have you been since you were released from the hospital?: Better Any questions or concerns?: Yes Patient Questions/Concerns:: Mom is very lethargic today. Sge went to the pulmonologist and he said her vitals was good. Patient Questions/Concerns Addressed: Other: (RN told patient daughter to wake mom up every few hrs. Daughter has an O2 sensor to monitor her oxygen saturation and if she sees it drop to below 90  or can't awaken her. short of breath or seems confused take to hospital.)  Items Reviewed: Did you receive and understand the discharge instructions provided?: Yes Medications obtained,verified, and reconciled?: Yes (Medications Reviewed) Any new allergies since your discharge?: No Dietary orders reviewed?: No Do you have support at home?: Yes People in Home [RPT]: alone Name of Support/Comfort Primary Source: [name] daughter  Medications Reviewed Today: Medications Reviewed Today     Reviewed by Kennieth Cathlean DEL, RN (Case Manager) on 10/10/24 at 1642  Med List Status: <None>   Medication Order Taking? Sig Documenting Provider Last Dose Status Informant  acetaminophen  (TYLENOL ) 500 MG tablet [id number] Yes Take 500 mg by mouth daily as needed for moderate pain (pain score 4-6) or mild pain (pain score 1-3). [provider]  Active Self, Pharmacy Records  ALPRAZolam  (XANAX ) 1 MG tablet [id number] Yes Take 1 mg by mouth at bedtime. [provider]  Active Self, Pharmacy Records           Med Note (CRUTHIS, CHLOE C   Mon Oct 02, 2024  2:36 PM) No fill hx found for this medication on PMP. Pt and daughter are adamant the pt is taking this medication every night at bedtime.   amLODipine  (NORVASC ) 2.5 MG tablet [id number] Yes TAKE 1 TABLET BY MOUTH IN THE MORNING AND AT BEDTIME Segal, Jared E, DO  Active Self, Pharmacy Records  Ascorbic Acid (VITAMIN C) 1000 MG tablet [id number] Yes Take 1,000 mg by mouth daily. [provider]  Active Self, Pharmacy Records  furosemide  (LASIX ) 20 MG tablet [id number] Yes Take 2 tablets (40 mg total) by mouth daily. Take extra dose as needed for weight gain of 3 LB in 1 day, 5 LB in 1 week Fairy Frames, MD  Active   levothyroxine  (SYNTHROID ) 50 MCG tablet [id number] Yes TAKE 1 TABLET BY MOUTH ONCE DAILY BEFORE BREAKFAST APPOINTMENT  REQUIRED  FOR  FUTURE  REFILLS [name], MD  Active Self, Pharmacy Records  LUMIGAN 0.01 % SOLN [id number] Yes Place 1 drop into both eyes at bedtime.  [provider]  Active Self, Pharmacy Records           Med Note (COX, HEATHER C   Wed Apr 14, 2017  9:08 AM)    metoprolol  succinate (TOPROL -XL) 25 MG 24 hr tablet [id number] Yes Take 3 tablets (75 mg total) by mouth daily. Fairy Frames, MD  Active   Multiple Vitamins-Minerals (PRESERVISION AREDS 2 PO) [id number] Yes Take 1 tablet by mouth daily. [provider]  Active Self, Pharmacy Records  Multiple Vitamins-Minerals (WOMENS 50+ MULTI VITAMIN/MIN PO)  [id number] Yes Take 1 tablet by mouth daily. [provider]  Active Self, Pharmacy Records  Omega-3 Fatty Acids (FISH OIL TRIPLE STRENGTH) 1400 MG CAPS [id number] Yes Take 1,400 mg by mouth daily. [provider]  Active Self, Pharmacy Records  ondansetron  (ZOFRAN ) 8 MG tablet [id number] Yes Take 1 tablet (8 mg total) by mouth every 8 (eight) hours as needed for nausea or vomiting. Start on the third day after carboplatin. Sherrod Sherrod,  MD  Active Self, Pharmacy Records  pantoprazole  (PROTONIX ) 40 MG tablet [id number] Yes Take 1 tablet (40 mg total) by mouth daily. TAKE 1 TABLET BY MOUTH ONCE DAILY  Patient taking differently: Take 40 mg by mouth daily.   [name], MD  Active Self, Pharmacy Records  Perfluorohexyloctane [facility]) 1.338 GM/ML SOLN [id number] Yes Place 1.338 mLs into both eyes 4 (four) times daily. [provider]  Active Self, Pharmacy Records  potassium chloride  (KLOR-CON  M) 10 MEQ tablet [id number] Yes Take 2 tablets (20 mEq total) by mouth every other day. Fairy Frames, MD  Active   prochlorperazine  (COMPAZINE ) 10 MG tablet [id number] Yes Take 1 tablet (10 mg total) by mouth every 6 (six) hours as needed for nausea or vomiting. Sherrod Sherrod, MD  Active Self, Pharmacy Records  Rivaroxaban  (XARELTO ) 15 MG TABS tablet [id number]  Take 1 tablet (15 mg total) by mouth daily with supper. Fairy Frames, MD  Active   vitamin k 100 MCG tablet [id number] Yes Take 100 mcg by mouth daily. [provider]  Active Self, Pharmacy Records            Home Care and Equipment/Supplies: Were Home Health Services Ordered?: NA Any new equipment or medical supplies ordered?: NA  Functional Questionnaire: Do you need assistance with bathing/showering or dressing?: Yes Do you need assistance with meal preparation?: Yes Do you need assistance with eating?: No Do you have difficulty maintaining continence: No Do you need assistance with getting out of bed/getting out of a chair/moving?: Yes Do you have difficulty managing or taking your medications?: No  Follow up appointments reviewed: PCP Follow-up appointment confirmed?: No MD Provider Line Number:[phone] Given: Yes Specialist Hospital Follow-up appointment confirmed?: Yes Date of Specialist follow-up appointment?: 10/10/24 Follow-Up Specialty Provider:: 12/002 Dorn Chill 1030 pulm   1204 Emeline Calender 1000 heart care Do you need  transportation to your follow-up appointment?: No Do you understand care options if your condition(s) worsen?: Yes-patient verbalized understanding  SDOH Interventions Today    Flowsheet Row Most Recent Value  SDOH Interventions   Food Insecurity Interventions Intervention Not Indicated  Housing Interventions Intervention Not Indicated  Transportation Interventions Patient Resources (Friends/Family)  Utilities Interventions Intervention Not Indicated    Goals Addressed             This Visit's Progress    VBCI Transitions of Care (TOC) Care Plan       Problems:  Recent Hospitalization for treatment of CHF and pleural effusion Knowledge Deficit Related to CHF and pleural effusion  Goal:  Over the next 30 days, the patient will not experience hospital readmission  Interventions:   Heart Failure Interventions: Discussed importance of daily weight and advised patient to weigh and record daily Reviewed role of diuretics in prevention of fluid overload and management of heart failure; Discussed the importance of keeping all appointments with provider  Patient Self Care Activities:  Attend all scheduled provider appointments Call pharmacy for medication refills 3-7 days in advance of running out of medications Call provider office  for new concerns or questions  Participate in Transition of Care Program/Attend TOC scheduled calls Take medications as prescribed   Ambulatory drain care Drain daily, up to max of 1L until patient is only able to drain out . If <175ml  for 3 consecutive drains every other day, then call Interventional Radiology [phone]) for evaluation and possible removal. Wound Care   Plan:  An initial telephone outreach has been scheduled for: [id number] 1:00 Next PCP appointment scheduled for: Patient daughter will follow up with PCP Telephone follow up appointment with care management team member scheduled for:  10/17/2024 Laine Tousey 1:00      RN  discussed nutritional supplements. Discussed and offered 30 day TOC program.  Patient   accepted .  The patient has been provided with contact information for the care management team and has been advised to call with any health -related questions or concerns.  The patient verbalized understanding with current plan of care.  The patient is directed to their insurance card regarding availability of benefits coverage   Cathlean Headland BSN RN [facility] Health Care Management Coordinator [email or web address] Direct Dial: [phone]  Fax: [phone] Website: [email or web address]

## 2024-10-10 NOTE — Progress Notes (Unsigned)
 New Patient Pulmonology Office Visit   Subjective:  Patient ID: Carolyn Wells, female    DOB: June 12, 1941  MRN: [id number]  Referred by: [name], MD  CC:  Chief Complaint  Patient presents with   Medical Management of Chronic Issues    Hospital f/u - not feeling well / change of meds Rx on 2 days     Discussed the use of AI scribe software for clinical note transcription with the patient, who gave verbal consent to proceed.  History of Present Illness Carolyn Wells is an 83 year old female with stage IV non-small cell lung cancer who presents for hospital follow up.  She has stage IV non-small cell lung adenocarcinoma diagnosed in October 2025 with malignant pleural effusions confirmed on thoracenteses on October 29 and September 14, 2024. She was admitted from November 25 to 29, 2025, for acute on chronic diastolic heart failure, recurrent malignant effusions, and atrial fibrillation. A pleurX catheter was placed on November 26 and removed on October 06, 2024, due to severe pleuritic pain.  She feels very fatigued and loopy today without headache. She has new left calf pain since this morning with difficulty walking. She has ongoing fatigue and nausea with intermittent vomiting since the hospitalization, thought related to pain medications. She denies current chest pain or shortness of breath, and notes chest pain improved after pleurX removal.  Her medications include furosemide  40 mg daily with an extra dose for weight gain, metoprolol  succinate 75 mg daily recently changed from metoprolol  tartrate, amlodipine , and Xarelto  for atrial fibrillation, which was briefly held for port placement. She feels she is taking more pills than food and has difficulty keeping up with her regimen.  She has chronic kidney disease, allergic rhinitis, GERD, hypertension, hyperlipidemia, and paroxysmal atrial fibrillation. Her daughter reports home blood pressure 112/74 with heart rate 68.  She has no fever or chills. She has decreased appetite.        Review of Systems  Constitutional:  Positive for malaise/fatigue. Negative for chills, fever and weight loss.  HENT:  Negative for congestion, sinus pain and sore throat.   Eyes: Negative.   Respiratory:  Negative for cough, hemoptysis, sputum production, shortness of breath and wheezing.   Cardiovascular:  Negative for chest pain, palpitations, orthopnea, claudication and leg swelling.  Gastrointestinal:  Negative for abdominal pain, heartburn, nausea and vomiting.  Genitourinary: Negative.   Musculoskeletal:  Negative for joint pain and myalgias.  Skin:  Negative for rash.  Neurological:  Positive for dizziness and weakness.  Endo/Heme/Allergies: Negative.   Psychiatric/Behavioral: Negative.      Allergies: Budesonide, Elavil  [amitriptyline ], Gabapentin , Tramadol , Hydrocodone, and Ipratropium  Current Outpatient Medications:    acetaminophen  (TYLENOL ) 500 MG tablet, Take 500 mg by mouth daily as needed for moderate pain (pain score 4-6) or mild pain (pain score 1-3)., Disp: , Rfl:    ALPRAZolam  (XANAX ) 1 MG tablet, Take 1 mg by mouth at bedtime., Disp: , Rfl:    amLODipine  (NORVASC ) 2.5 MG tablet, TAKE 1 TABLET BY MOUTH IN THE MORNING AND AT BEDTIME, Disp: 180 tablet, Rfl: 3   Ascorbic Acid (VITAMIN C) 1000 MG tablet, Take 1,000 mg by mouth daily., Disp: , Rfl:    furosemide  (LASIX ) 20 MG tablet, Take 2 tablets (40 mg total) by mouth daily. Take extra dose as needed for weight gain of 3 LB in 1 day, 5 LB in 1 week, Disp: 60 tablet, Rfl: 0   levothyroxine  (SYNTHROID ) 50 MCG tablet,  TAKE 1 TABLET BY MOUTH ONCE DAILY BEFORE BREAKFAST APPOINTMENT  REQUIRED  FOR  FUTURE  REFILLS, Disp: 90 tablet, Rfl: 0   LUMIGAN 0.01 % SOLN, Place 1 drop into both eyes at bedtime. , Disp: , Rfl:    metoprolol  succinate (TOPROL -XL) 25 MG 24 hr tablet, Take 3 tablets (75 mg total) by mouth daily., Disp: 90 tablet, Rfl: 0   Multiple  Vitamins-Minerals (PRESERVISION AREDS 2 PO), Take 1 tablet by mouth daily., Disp: , Rfl:    Multiple Vitamins-Minerals (WOMENS 50+ MULTI VITAMIN/MIN PO), Take 1 tablet by mouth daily., Disp: , Rfl:    Omega-3 Fatty Acids (FISH OIL TRIPLE STRENGTH) 1400 MG CAPS, Take 1,400 mg by mouth daily., Disp: , Rfl:    ondansetron  (ZOFRAN ) 8 MG tablet, Take 1 tablet (8 mg total) by mouth every 8 (eight) hours as needed for nausea or vomiting. Start on the third day after carboplatin., Disp: 30 tablet, Rfl: 1   pantoprazole  (PROTONIX ) 40 MG tablet, Take 1 tablet (40 mg total) by mouth daily. TAKE 1 TABLET BY MOUTH ONCE DAILY (Patient taking differently: Take 40 mg by mouth daily.), Disp: 90 tablet, Rfl: 3   Perfluorohexyloctane (MIEBO) 1.338 GM/ML SOLN, Place 1.338 mLs into both eyes 4 (four) times daily., Disp: , Rfl:    potassium chloride  (KLOR-CON  M) 10 MEQ tablet, Take 2 tablets (20 mEq total) by mouth every other day., Disp: 60 tablet, Rfl: 0   prochlorperazine  (COMPAZINE ) 10 MG tablet, Take 1 tablet (10 mg total) by mouth every 6 (six) hours as needed for nausea or vomiting., Disp: 30 tablet, Rfl: 1   [MAR Hold] Rivaroxaban  (XARELTO ) 15 MG TABS tablet, Take 1 tablet (15 mg total) by mouth daily with supper., Disp: , Rfl:    vitamin k 100 MCG tablet, Take 100 mcg by mouth daily., Disp: , Rfl:  Past Medical History:  Diagnosis Date   ABDOMINAL INCISIONAL HERNIA 08/13/2009   s/p repair   Allergic rhinitis, cause unspecified 09/28/2011   BRADYCARDIA 09/26/2010   CKD (chronic kidney disease), stage III (HCC) 02/26/2015   Solitary kidney   COLONIC POLYPS, HX OF 08/25/2007   Complication of anesthesia    DIVERTICULOSIS, COLON 08/25/2007   Dysrhythmia    GASTROESOPHAGEAL REFLUX DISEASE 08/04/2007   HYPERLIPIDEMIA 09/27/2010   not on Rx therapy   HYPERTENSION 08/06/2007   Hypothyroidism    INTERNAL HEMORRHOIDS 08/04/2007   LOW BACK PAIN 08/06/2007   scoliosis; DJD; gets injections   Lung cancer (HCC)     OSTEOPENIA 08/04/2007   PAF (paroxysmal atrial fibrillation) (HCC)    a.  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   Past Surgical History:  Procedure Laterality Date   CARDIOVERSION [name] 08/30/2024   Procedure: CARDIOVERSION;  Surgeon: Francyne Headland, MD;  Location: [facility];  Service: Cardiovascular;  Laterality: [name];   IR IMAGING GUIDED PORT INSERTION  10/09/2024   IR PERC PLEURAL DRAIN W/INDWELL CATH W/IMG GUIDE  10/04/2024   IR THORACENTESIS ASP PLEURAL SPACE W/IMG GUIDE  09/06/2024   IR THORACENTESIS ASP PLEURAL SPACE W/IMG GUIDE  09/14/2024   IR THORACENTESIS ASP PLEURAL SPACE W/IMG GUIDE  09/28/2024   LAMINECTOMY  1999   L4-5/lumbar   NEPHRECTOMY     Right   s/o incisional hernia repair  RUQ   STRABISMUS SURGERY Bilateral 04/14/2019   Procedure: STRABISMUS REPAIR BILATERAL;  Surgeon: Neysa Fallow, MD;  Location: [facility];  Service: Ophthalmology;  Laterality: Bilateral;   TONSILLECTOMY     Family History  Problem Relation Age of Onset   Coronary artery disease Mother 48       1st degree relative    Diabetes Mother    Heart attack Father 71   Diabetes Other        1st degree relative   Alzheimer's disease Brother    Social History   Socioeconomic History   Marital status: Married    Spouse name: Not on file   Number of children: 2   Years of education: Not on file   Highest education level: Not on file  Occupational History   Occupation: retired Passenger transport manager co.    Employer: RETIRED  Tobacco Use   Smoking status: Never    Passive exposure: Never   Smokeless tobacco: Never  Substance and Sexual Activity   Alcohol use: No    Alcohol/week: 0.0 standard drinks of alcohol   Drug use: No   Sexual activity: Not on file  Other Topics Concern    Not on file  Social History Narrative   Husband disabled, renal failure, DM, HTN.   Originally from [place], [place] >> came to [place]  [place] (husband in Electronics [employer])   Social Drivers of Corporate Investment Banker Strain: Low Risk  (03/22/2024)   Received from [facility]   Overall Financial Resource Strain (CARDIA)    Difficulty of Paying Living Expenses: Not hard at all  Food Insecurity: No Food Insecurity (10/10/2024)   Hunger Vital Sign    Worried About Running Out of Food in the Last Year: Never true    Ran Out of Food in the Last Year: Never true  Transportation Needs: No Transportation Needs (10/10/2024)   PRAPARE - Administrator, Civil Service (Medical): No    Lack of Transportation (Non-Medical): No  Physical Activity: Sufficiently Active (02/09/2023)   Exercise Vital Sign    Days of Exercise per Week: 5 days    Minutes of Exercise per Session: 30 min  Stress: No Stress Concern Present (02/09/2023)   [employer] of Occupational Health - Occupational Stress Questionnaire    Feeling of Stress : Not at all  Social Connections: Moderately Integrated (10/03/2024)   Social Connection and Isolation Panel    Frequency of Communication with Friends and Family: Three times a week    Frequency of Social Gatherings with Friends and Family: Three times a week    Attends Religious Services: More than 4 times per year    Active Member of Clubs or Organizations: No    Attends Banker Meetings: More than 4 times per year    Marital Status: Widowed  Intimate Partner Violence: Not At Risk (10/10/2024)   Humiliation, Afraid, Rape, and Kick questionnaire    Fear of Current or Ex-Partner: No    Emotionally Abused: No    Physically Abused: No    Sexually Abused: No       Objective:  BP 112/74   Pulse 68   Ht 5' 6 (1.676 m) Comment: per pt  Wt 134 lb (60.8 kg)   SpO2 97%   BMI 21.63 kg/m    Physical Exam  Diagnostic Review:       Assessment & Plan:    Assessment & Plan Non-small cell lung cancer with metastasis (  HCC)     Malignant pleural effusion (HCC)      Assessment and Plan Assessment & Plan Stage IV non-small cell lung cancer (adenocarcinoma) with malignant pleural effusion Awaiting first chemotherapy infusion, which may help prevent fluid reaccumulation. - Proceed with first chemotherapy infusion next Monday. - Monitor for recurrence of shortness of breath, consider thoracentesis if symptoms return.  Acute on chronic diastolic heart failure Blood pressure and heart rate well-managed. Monitoring for potential side effects of medication changes. - Continue furosemide  40 mg daily, additional dose if weight gain exceeds 3 pounds. - Monitor blood pressure and heart rate regularly. - Hold metoprolol  if blood pressure <90/60 or heart rate <60. - Consulted cardiology team regarding metoprolol  regimen.  Paroxysmal atrial fibrillation Xarelto  was stopped for port placement and needs to be restarted. - Restart Xarelto  as soon as possible. - Monitor for signs of bleeding or clotting.  Left calf pain, possible deep vein thrombosis Pain improving, may be muscular. High risk for clots due to cancer. - Restart Xarelto  to address potential clot. - Monitor for worsening symptoms or signs of DVT. Consider lower extremity US .  Anemia secondary to chronic disease and malignancy Anemia likely contributing to fatigue and weakness. Blood counts low. - Monitor blood counts and symptoms. - Consider blood transfusion if fatigue and weakness worsen.  Chronic kidney disease Recent creatinine elevation. Monitoring kidney function due to medication changes. - Continue to monitor kidney function regularly.  Hypertension Continue current antihypertensive regimen. - Monitor blood pressure regularly.      Return in about 3 months (around 01/08/2025) for f/u visit Dr. Kara.   Dorn KATHEE Kara, MD

## 2024-10-11 ENCOUNTER — Encounter: Payer: Self-pay | Admitting: Pulmonary Disease

## 2024-10-11 ENCOUNTER — Other Ambulatory Visit: Payer: Self-pay

## 2024-10-11 ENCOUNTER — Inpatient Hospital Stay

## 2024-10-11 NOTE — Assessment & Plan Note (Signed)
 SABRA

## 2024-10-12 ENCOUNTER — Other Ambulatory Visit: Payer: Self-pay | Admitting: Internal Medicine

## 2024-10-12 ENCOUNTER — Encounter: Payer: Self-pay | Admitting: Internal Medicine

## 2024-10-12 ENCOUNTER — Other Ambulatory Visit: Payer: Self-pay

## 2024-10-12 ENCOUNTER — Ambulatory Visit: Payer: Self-pay | Admitting: Pulmonary Disease

## 2024-10-12 ENCOUNTER — Ambulatory Visit: Admitting: Internal Medicine

## 2024-10-12 ENCOUNTER — Ambulatory Visit
Admission: RE | Admit: 2024-10-12 | Discharge: 2024-10-12 | Disposition: A | Source: Ambulatory Visit | Attending: Internal Medicine

## 2024-10-12 VITALS — BP 98/56 | HR 67 | Ht 66.0 in | Wt 133.4 lb

## 2024-10-12 DIAGNOSIS — Z7901 Long term (current) use of anticoagulants: Secondary | ICD-10-CM

## 2024-10-12 DIAGNOSIS — R0602 Shortness of breath: Secondary | ICD-10-CM

## 2024-10-12 DIAGNOSIS — J449 Chronic obstructive pulmonary disease, unspecified: Secondary | ICD-10-CM

## 2024-10-12 DIAGNOSIS — J90 Pleural effusion, not elsewhere classified: Secondary | ICD-10-CM

## 2024-10-12 DIAGNOSIS — R0600 Dyspnea, unspecified: Secondary | ICD-10-CM

## 2024-10-12 MED ORDER — LIDOCAINE-EPINEPHRINE 1 %-1:100000 IJ SOLN
INTRAMUSCULAR | Status: AC
  Filled 2024-10-12: qty 1

## 2024-10-12 MED ORDER — AMLODIPINE BESYLATE 2.5 MG PO TABS: 2.5000 mg | ORAL_TABLET | Freq: Every day | ORAL | Status: AC

## 2024-10-12 NOTE — Telephone Encounter (Signed)
 Secure chat sent to [facility], Dr. Luann CMA, she states that the [facility] was to get back to us  about it because of conflict on 12/12 when Dewald wanted it done.  I spoke with patient's daughter, [name], I advised her that Dr. Kara was working on getting the procedure scheduled for next week, however there was a conflict with when he wanted it done.  We are waiting to hear back from our patient care coordinator to see when the procedure can get scheduled.  Her daughter stated she is starting chemo on Monday and they were hoping to get it done before then.  I advised her that it has to be scheduled at the hospital either through IR or sometimes Dr. Kara can do it when he is working at the hospital.  She said Dr. Emery had had it done before, she does not want her to end up in the ER.  I advised her that either one of the clinical staff of the patient care coordinators would call them back once we had it scheduled.

## 2024-10-12 NOTE — Progress Notes (Signed)
 Cardiology Office Note   Date:  10/12/2024  ID:  Carolyn Wells, DOB [birth year], MRN [id number] PCP: [name], MD  [facility] Providers Cardiologist:  Emeline FORBES Calender, DO     History of Present Illness Carolyn Wells is a 83 y.o. female who presents with her daughter with a past medical history of longstanding persistent atrial fibrillation on Xarelto  s/p cardioversion 08/30/2024, stage IV metastatic non-small cell lung cancer with recurrent malignant pleural effusions requiring recurrent thoracentesis and plan for noncurative/palliative intent chemotherapy (pembrolizumab, paclitaxel, carboplatin), hypertension, aortic atherosclerosis, chronic venous insufficiency, CKD 4 who presents today for follow-up.  She was most recently seen by myself 09/28/2024 but had worsening shortness of breath with concern for recurrent malignant pleural effusion and was sent to the ED from the office.  She ended up requiring a thoracentesis and PleurX catheter however had severe pain from the PleurX catheter and this was removed.  She was discharged on metoprolol  succinate and Lasix  40 mg daily.  Today, she states she is having recurrent shortness of breath with chest tightness similar to her recurrent pleural effusion episodes.  She tried calling the pulmonologist office today for repeat thoracentesis however had complications with the staff and was unable to have this ordered.  She took Xarelto  last night, notably.  Otherwise no complaints.  She is doing well with her recently increased Lasix  to 40 mg daily and has not had swelling in her legs.  She noted a low blood pressure today but normally her blood pressures are well-controlled on amlodipine  2.5 mg twice daily.  ROS:  Review of Systems  All other systems reviewed and are negative.   Physical Exam  Physical Exam Vitals and nursing note reviewed.  Constitutional:      Appearance: Normal appearance.  HENT:     Head: Normocephalic and  atraumatic.  Eyes:     Conjunctiva/sclera: Conjunctivae normal.  Cardiovascular:     Rate and Rhythm: Normal rate. Rhythm irregular.  Pulmonary:     Comments: Decreased breath sounds bilaterally Musculoskeletal:        General: No swelling or tenderness.  Skin:    Coloration: Skin is not jaundiced or pale.  Neurological:     Mental Status: She is alert.     VS:  BP (!) 98/56   Pulse 67   Ht 5' 6 (1.676 m)   Wt 133 lb 6.4 oz (60.5 kg)   SpO2 99%   BMI 21.53 kg/m         Wt Readings from Last 3 Encounters:  10/12/24 133 lb 6.4 oz (60.5 kg)  10/10/24 134 lb (60.8 kg)  10/09/24 133 lb (60.3 kg)     EKG Interpretation Date/Time:    Ventricular Rate:    PR Interval:    QRS Duration:    QT Interval:    QTC Calculation:   R Axis:      Text Interpretation:      Studies Reviewed   Echocardiogram 01/07/2023: EF 50 to 55% with low normal ejection function and no regional wall motion abnormalities Mild LVH Mildly reduced RV function and mildly increased RV wall thickness Moderately elevated pulmonary artery systolic pressure at 47.4 mmHg Moderately dilated left atrium Mildly dilated right atrium Mild MR Aortic valve calcification     Risk Assessment/Calculations   CHA2DS2-VASc Score = 5   This indicates a 7.2% annual risk of stroke. The patient's score is based upon: CHF History: 0 HTN History: 1 Diabetes History: 0 Stroke  History: 0 Vascular Disease History: 1 Age Score: 2 Gender Score: 1             ASSESSMENT  Recurrent shortness of breath concern for recurrent malignant pleural effusions with history of recurrent effusions requiring multiple thoracenteses was recently hospitalized for recurrent effusion requiring thoracentesis.  Did not tolerate a PleurX catheter and now needs to undergo recurrent thoracenteses.  Now with recurrent symptoms consistent with her malignant effusions.  Currently on Lasix  40 mg daily Longstanding persistent atrial  fibrillation, s/p cardioversion 08/30/2024 CHA2DS2-VASc: 5.  Stroke prophylaxis with renally dosed Xarelto  15 mg and rate controlled on metoprolol  succinate 75 mg daily.  Did not tolerate amiodarone .   Recently diagnosed metastatic non-small cell adenocarcinoma of the lung requiring recurrent thoracenteses for recurrent pleural effusions with plans to start on palliative chemotherapy (pembrolizumab, paclitaxel, carboplatin) Chronic fatigue Chronic anticoagulation Hypertension on amlodipine  2.5 mg twice daily and metoprolol  succinate.  BP low today at 98/56. Hyperlipidemia CKD 4     Plan  Will arrange for thoracentesis today as she had difficulty getting this taken care of when she contacted her pulmonologist's office.  She was advised to go to the ED if this is unable to be performed or she has worsening symptoms.  She was advised to hold her Xarelto  until this is completed She can continue Xarelto  after the thoracentesis unless otherwise noted Will change amlodipine  to 2.5 mg daily (from twice daily) Continue current medications otherwise  Follow up: 4 weeks          Signed, Emeline Calender, DO

## 2024-10-12 NOTE — Telephone Encounter (Signed)
 Daughter of pt called back regarding order to remove fluid from lungs. Sent note to Pulm Red - advised to call clinic. Called and s/w Debra. She does not see that pulm has ever ordered this in the past and requested that I send another CRM.   Advised ED d/t pt increasing SOB.

## 2024-10-12 NOTE — Patient Instructions (Signed)
 Medication Instructions:  DECREASE amlodipine  2.5 mg to 1 tablet once a day  HOLD Rivaroxaban  (Xarelto ) 15 mg for the next 2 days  *If you need a refill on your cardiac medications before your next appointment, please call your pharmacy*  Follow-Up: At [facility], you and your health needs are our priority.  As part of our continuing mission to provide you with exceptional heart care, our providers are all part of one team.  This team includes your primary Cardiologist (physician) and Advanced Practice Providers or APPs (Physician Assistants and Nurse Practitioners) who all work together to provide you with the care you need, when you need it.  Your next appointment:   4 week(s)  Provider:   Emeline FORBES Calender, DO    Other:  Blood Pressure Record Sheet To take your blood pressure, you will need a blood pressure machine. You can buy a blood pressure machine (blood pressure monitor) at your clinic, drug store, or online. When choosing one, consider: An automatic monitor that has an arm cuff. A cuff that wraps snugly around your upper arm. You should be able to fit only one finger between your arm and the cuff. A device that stores blood pressure reading results. Do not choose a monitor that measures your blood pressure from your wrist or finger. Follow your health care provider's instructions for how to take your blood pressure. To use this form: Take your blood pressure medications every day These measurements should be taken when you have been at rest for at least 10-15 min Take at least 2 readings with each blood pressure check. This makes sure the results are correct. Wait 1-2 minutes between measurements. Write down the results in the spaces on this form. Keep in mind it should always be recorded systolic over diastolic. Both numbers are important.  Repeat this every day for 2-3 weeks, or as told by your health care provider.  Make a follow-up appointment with your health care  provider to discuss the results.  Blood Pressure Log Date Medications taken? (Y/N) Blood Pressure Time of Day

## 2024-10-12 NOTE — Telephone Encounter (Signed)
 Dr. Kara, Patient is having a thora in IR today, 12/4, set up by Dr. Kriste with Cardiology.  Just FYI.

## 2024-10-12 NOTE — Procedures (Signed)
 83 year old with lung cancer and recurrent right pleural effusion. Recently underwent pleur-x drain placement 10/04/24 with 1.7 L removed at placement; however, the drain was removed per patient request for discomfort related to drain. Therapeutic thoracentesis requested today for increased SOB noted by outpatient provider.   Minimal right-sided pleural effusion see with US  chest today. No significant pocket of fluid or percutaneous window to allow safe thoracentesis. Risks outweigh the benefits.   Please see imaging section of Epic for full dictation.  Aniesa Boback NP 10/12/2024 1:58 PM

## 2024-10-12 NOTE — Telephone Encounter (Signed)
 FYI Only or Action Required?: Action required by provider: pt would like order for fluid to be drained out of lungs.  Patient was last seen in primary care on 08/25/2024 by [name], MD.  Called Nurse Triage reporting Shortness of Breath.  Symptoms began several days ago.  Interventions attempted: Nothing.  Symptoms are: gradually worsening.  Triage Disposition: See HCP Within 4 Hours (Or PCP Triage)  Patient/caregiver understands and will follow disposition?: no, wants order for fluid removal from lungs  E2C2 Pulmonary Triage - Initial Assessment Questions "Chief Complaint (e.g., cough, sob, wheezing, fever, chills, sweat or additional symptoms) *Go to specific symptom protocol after initial questions. SOB-denies SOB at rest  "How long have symptoms been present?" Couple days  Have you tested for COVID or Flu? Note: If not, ask patient if a home test can be taken. If so, instruct patient to call back for positive results. No  MEDICINES:   "Have you used any OTC meds to help with symptoms?" No  OXYGEN: "Do you wear supplemental oxygen?" No "Do you monitor your oxygen levels?" No If yes, What is your reading (oxygen level) today? 97% yesterday  What is your usual oxygen saturation reading?  (Note: Pulmonary O2 sats should be 90% or greater) 97%  Copied from CRM #[id number]. Topic: Clinical - Red Word Triage >> Oct 12, 2024  8:03 AM Corean SAUNDERS wrote: Red Word that prompted transfer to Nurse Triage: Lungs filled with fluid. Reason for Disposition  [1] MILD difficulty breathing (e.g., minimal/no SOB at rest, SOB with walking, pulse < 100) AND [2] NEW-onset or WORSE than normal  Answer Assessment - Initial Assessment Questions 1. RESPIRATORY STATUS: Describe your breathing? (e.g., wheezing, shortness of breath, unable to speak, severe coughing)      SOB 2. ONSET: When did this breathing problem begin?      Couple days 3. PATTERN Does the difficult breathing  come and go, or has it been constant since it started?      constant 4. SEVERITY: How bad is your breathing? (e.g., mild, moderate, severe)      Mild-moderate 5. RECURRENT SYMPTOM: Have you had difficulty breathing before? If Yes, ask: When was the last time? and What happened that time?      Yes when she need to have her lungs drained 6. CARDIAC HISTORY: Do you have any history of heart disease? (e.g., heart attack, angina, bypass surgery, angioplasty)      Afib, CHF 7. LUNG HISTORY: Do you have any history of lung disease?  (e.g., pulmonary embolus, asthma, emphysema)     Lung CA 8. CAUSE: What do you think is causing the breathing problem?      Tumor in lungs causes this 9. OTHER SYMPTOMS: Do you have any other symptoms? (e.g., chest pain, cough, dizziness, fever, runny nose)     cough 10. O2 SATURATION MONITOR:  Do you use an oxygen saturation monitor (pulse oximeter) at home? If Yes, ask: What is your reading (oxygen level) today? What is your usual oxygen saturation reading? (e.g., 95%)       Yesterday was 97%  Protocols used: Breathing Difficulty-A-AH

## 2024-10-13 ENCOUNTER — Encounter: Admitting: Gastroenterology

## 2024-10-13 NOTE — Telephone Encounter (Signed)
 Noted. I was out of office on 12/4.  Dorn Chill, MD [facility] Office: [phone]

## 2024-10-16 ENCOUNTER — Inpatient Hospital Stay

## 2024-10-16 VITALS — BP 129/85 | HR 79 | Temp 97.5°F | Resp 16 | Ht 66.0 in | Wt 133.5 lb

## 2024-10-16 DIAGNOSIS — Z79899 Other long term (current) drug therapy: Secondary | ICD-10-CM | POA: Diagnosis not present

## 2024-10-16 DIAGNOSIS — C3411 Malignant neoplasm of upper lobe, right bronchus or lung: Secondary | ICD-10-CM

## 2024-10-16 DIAGNOSIS — N183 Chronic kidney disease, stage 3 unspecified: Secondary | ICD-10-CM | POA: Diagnosis not present

## 2024-10-16 DIAGNOSIS — D631 Anemia in chronic kidney disease: Secondary | ICD-10-CM | POA: Diagnosis not present

## 2024-10-16 LAB — CBC WITH DIFFERENTIAL (CANCER CENTER ONLY)
Abs Immature Granulocytes: 0.03 K/uL (ref 0.00–0.07)
Basophils Absolute: 0.1 K/uL (ref 0.0–0.1)
Basophils Relative: 1 %
Eosinophils Absolute: 0.2 K/uL (ref 0.0–0.5)
Eosinophils Relative: 3 %
HCT: 28.2 % — ABNORMAL LOW (ref 36.0–46.0)
Hemoglobin: 9.1 g/dL — ABNORMAL LOW (ref 12.0–15.0)
Immature Granulocytes: 0 %
Lymphocytes Relative: 8 %
Lymphs Abs: 0.7 K/uL (ref 0.7–4.0)
MCH: 28.3 pg (ref 26.0–34.0)
MCHC: 32.3 g/dL (ref 30.0–36.0)
MCV: 87.9 fL (ref 80.0–100.0)
Monocytes Absolute: 0.8 K/uL (ref 0.1–1.0)
Monocytes Relative: 9 %
Neutro Abs: 6.7 K/uL (ref 1.7–7.7)
Neutrophils Relative %: 79 %
Platelet Count: 367 K/uL (ref 150–400)
RBC: 3.21 MIL/uL — ABNORMAL LOW (ref 3.87–5.11)
RDW: 14.9 % (ref 11.5–15.5)
WBC Count: 8.4 K/uL (ref 4.0–10.5)
nRBC: 0 % (ref 0.0–0.2)

## 2024-10-16 LAB — CMP (CANCER CENTER ONLY)
ALT: 11 U/L (ref 0–44)
AST: 28 U/L (ref 15–41)
Albumin: 3.6 g/dL (ref 3.5–5.0)
Alkaline Phosphatase: 114 U/L (ref 38–126)
Anion gap: 13 (ref 5–15)
BUN: 44 mg/dL — ABNORMAL HIGH (ref 8–23)
CO2: 28 mmol/L (ref 22–32)
Calcium: 9.7 mg/dL (ref 8.9–10.3)
Chloride: 98 mmol/L (ref 98–111)
Creatinine: 2.06 mg/dL — ABNORMAL HIGH (ref 0.44–1.00)
GFR, Estimated: 23 mL/min — ABNORMAL LOW (ref >60)
Glucose, Bld: 159 mg/dL — ABNORMAL HIGH (ref 70–99)
Potassium: 4.3 mmol/L (ref 3.5–5.1)
Sodium: 139 mmol/L (ref 135–145)
Total Bilirubin: 0.3 mg/dL (ref 0.0–1.2)
Total Protein: 6.8 g/dL (ref 6.5–8.1)

## 2024-10-16 LAB — TSH: TSH: 1.77 u[IU]/mL (ref 0.350–4.500)

## 2024-10-16 MED ORDER — SODIUM CHLORIDE 0.9 % IV SOLN
200.0000 mg | Freq: Once | INTRAVENOUS | Status: AC
  Administered 2024-10-16: 200 mg via INTRAVENOUS
  Filled 2024-10-16: qty 200

## 2024-10-16 MED ORDER — DEXAMETHASONE SOD PHOSPHATE PF 10 MG/ML IJ SOLN
10.0000 mg | Freq: Once | INTRAMUSCULAR | Status: AC
  Administered 2024-10-16: 10 mg via INTRAVENOUS

## 2024-10-16 MED ORDER — SODIUM CHLORIDE 0.9 % IV SOLN
220.0000 mg | Freq: Once | INTRAVENOUS | Status: AC
  Administered 2024-10-16: 220 mg via INTRAVENOUS
  Filled 2024-10-16: qty 22

## 2024-10-16 MED ORDER — APREPITANT 130 MG/18ML IV EMUL
130.0000 mg | Freq: Once | INTRAVENOUS | Status: AC
  Administered 2024-10-16: 130 mg via INTRAVENOUS
  Filled 2024-10-16: qty 18

## 2024-10-16 MED ORDER — DIPHENHYDRAMINE HCL 50 MG/ML IJ SOLN
25.0000 mg | Freq: Once | INTRAMUSCULAR | Status: AC
  Administered 2024-10-16: 25 mg via INTRAVENOUS
  Filled 2024-10-16: qty 1

## 2024-10-16 MED ORDER — SODIUM CHLORIDE 0.9 % IV SOLN
175.0000 mg/m2 | Freq: Once | INTRAVENOUS | Status: AC
  Administered 2024-10-16: 300 mg via INTRAVENOUS
  Filled 2024-10-16: qty 50

## 2024-10-16 MED ORDER — SODIUM CHLORIDE 0.9 % IV SOLN: 252.0000 mg | Freq: Once | INTRAVENOUS | Status: DC

## 2024-10-16 MED ORDER — FAMOTIDINE IN NACL 20-0.9 MG/50ML-% IV SOLN
20.0000 mg | Freq: Once | INTRAVENOUS | Status: AC
  Administered 2024-10-16: 20 mg via INTRAVENOUS
  Filled 2024-10-16: qty 50

## 2024-10-16 MED ORDER — SODIUM CHLORIDE 0.9 % IV SOLN: INTRAVENOUS | Status: DC

## 2024-10-16 MED ORDER — PALONOSETRON HCL INJECTION 0.25 MG/5ML
0.2500 mg | Freq: Once | INTRAVENOUS | Status: AC
  Administered 2024-10-16: 0.25 mg via INTRAVENOUS
  Filled 2024-10-16: qty 5

## 2024-10-16 NOTE — Patient Instructions (Signed)
 [facility] - A DEPT OF [facility]  Discharge Instructions: Thank you for choosing [facility] to provide your oncology and hematology care.   If you have a lab appointment with the Cancer Center, please go directly to the Cancer Center and check in at the registration area.   Wear comfortable clothing and clothing appropriate for easy access to any Portacath or PICC line.   We strive to give you quality time with your provider. You may need to reschedule your appointment if you arrive late (15 or more minutes).  Arriving late affects you and other patients whose appointments are after yours.  Also, if you miss three or more appointments without notifying the office, you may be dismissed from the clinic at the provider's discretion.      For prescription refill requests, have your pharmacy contact our office and allow 72 hours for refills to be completed.    Today you received the following chemotherapy and/or immunotherapy agents: Keytruda , Paclitaxel , Carboplatin       To help prevent nausea and vomiting after your treatment, we encourage you to take your nausea medication as directed.  BELOW ARE SYMPTOMS THAT SHOULD BE REPORTED IMMEDIATELY: *FEVER GREATER THAN 100.4 F (38 C) OR HIGHER *CHILLS OR SWEATING *NAUSEA AND VOMITING THAT IS NOT CONTROLLED WITH YOUR NAUSEA MEDICATION *UNUSUAL SHORTNESS OF BREATH *UNUSUAL BRUISING OR BLEEDING *URINARY PROBLEMS (pain or burning when urinating, or frequent urination) *BOWEL PROBLEMS (unusual diarrhea, constipation, pain near the anus) TENDERNESS IN MOUTH AND THROAT WITH OR WITHOUT PRESENCE OF ULCERS (sore throat, sores in mouth, or a toothache) UNUSUAL RASH, SWELLING OR PAIN  UNUSUAL VAGINAL DISCHARGE OR ITCHING   Items with * indicate a potential emergency and should be followed up as soon as possible or go to the Emergency Department if any problems should occur.  Please show the CHEMOTHERAPY  ALERT CARD or IMMUNOTHERAPY ALERT CARD at check-in to the Emergency Department and triage nurse.  Should you have questions after your visit or need to cancel or reschedule your appointment, please contact [facility] - A DEPT OF [facility]  Dept: [phone]  and follow the prompts.  Office hours are 8:00 a.m. to 4:30 p.m. Monday - Friday. Please note that voicemails left after 4:00 p.m. may not be returned until the following business day.  We are closed weekends and major holidays. You have access to a nurse at all times for urgent questions. Please call the main number to the clinic Dept: [phone] and follow the prompts.   For any non-urgent questions, you may also contact your provider using MyChart. We now offer e-Visits for anyone 40 and older to request care online for non-urgent symptoms. For details visit [email or web address].   Also download the MyChart app! Go to the app store, search MyChart, open the app, select [facility], and log in with your MyChart username and password.

## 2024-10-16 NOTE — Progress Notes (Signed)
 Okay to decrease carboplatin  dose to 220 mg today per Cassie, PA-C due to increase in creatinine.  Harlene Nasuti, PharmD Oncology Infusion Pharmacist 10/16/2024 9:17 AM

## 2024-10-17 ENCOUNTER — Ambulatory Visit: Payer: Self-pay | Admitting: *Deleted

## 2024-10-17 ENCOUNTER — Other Ambulatory Visit: Payer: Self-pay | Admitting: *Deleted

## 2024-10-17 LAB — T4: T4, Total: 8.4 ug/dL (ref 4.5–12.0)

## 2024-10-17 NOTE — Patient Instructions (Signed)
 Visit Information  Thank you for taking time to visit with me today. Please don't hesitate to contact me if I can be of assistance to you before our next scheduled telephone appointment.  Our next appointment is by telephone on Tuesday, October 24, 2024 at 1:00 pm  Please call the care guide team at [phone] if you need to cancel or reschedule your appointment.   Following are the goals we discussed today:  Patient Self Care Activities:  Attend all scheduled provider appointments Call pharmacy for medication refills 3-7 days in advance of running out of medications Call provider office for new concerns or questions  Participate in Transition of Care Program/Attend TOC scheduled calls Take medications as prescribed   Continue pacing activity to avoid episodes of shortness of breath Continue to follow your established action plan for episodes of shortness of breath Weigh yourself every day to stay on top of early fluid retention: write down your weights every day so you remember what it is from day to day: follow the weight-gain guidelines and action plan to call your doctor if you gain more than 3 lbs overnight, or 5 lbs in one week If you believe your condition is getting worse- contact your care providers (doctors) promptly- reaching out to your doctor early when you have concerns can prevent you from having to go to the hospital Ambulatory drain care Drain daily, up to max of 1L until patient is only able to drain out . If <14ml  for 3 consecutive drains every other day, then call Interventional Radiology [phone]) for evaluation and possible removal.  If you are experiencing a Mental Health or Behavioral Health Crisis or need someone to talk to, please  call the Suicide and Crisis Lifeline: 988 call the [place]  National Suicide Prevention Lifeline: 319-223-4867 or TTY: 979-298-6255 TTY (332)045-0421) to talk to a trained counselor call 1-800-273-TALK (toll free, 24 hour  hotline) go to [facility] [address], [place] [phone]) call the [facility] Crisis Line: [phone] call 911   Care plan and visit instructions communicated with the patient verbally today. Patient agrees to receive a copy in MyChart. Active MyChart status and patient understanding of how to access instructions and care plan via MyChart confirmed with patient.     Maysin Carstens Mckinney Ahmod Gillespie, RN, BSN, Media Planner  Transitions of Care  [facility] - [facility] [phone]: direct office

## 2024-10-17 NOTE — Transitions of Care (Post Inpatient/ED Visit) (Signed)
 Transition of Care week 2/ day # 7  Visit Note  10/17/2024  Name: Carolyn Wells MRN: [id number]          DOB: 1941/09/01  Situation: Patient enrolled in [facility] 30-day program. Visit completed with patient and her daughter- caregiver [name] by telephone.   HIPAA identifiers x 2 verified  Background:  Recent hospitalization November 24-29, 2025 for CHF: recurrent (R) pleural effusion in setting of stage IV lung CA Resides alone, independent in most self-care activities; daughter- caregiver manages health care needs- lives 6 miles from patient and visits patient daily- attends all appointments with patient and provides all transportation; no current assistive devices (1) unplanned hospitalization x last (6)/ (12) months  Initial Transition Care Management Follow-up Telephone Call Discharge Date and Diagnosis: 10/07/24, Recurrent R pleural effusion / CHF in setting of stage IV lung ca   Past Medical History:  Diagnosis Date   ABDOMINAL INCISIONAL HERNIA 08/13/2009   s/p repair   Allergic rhinitis, cause unspecified 09/28/2011   BRADYCARDIA 09/26/2010   CKD (chronic kidney disease), stage III (HCC) 02/26/2015   Solitary kidney   COLONIC POLYPS, HX OF 08/25/2007   Complication of anesthesia    DIVERTICULOSIS, COLON 08/25/2007   Dysrhythmia    GASTROESOPHAGEAL REFLUX DISEASE 08/04/2007   HYPERLIPIDEMIA 09/27/2010   not on Rx therapy   HYPERTENSION 08/06/2007   Hypothyroidism    INTERNAL HEMORRHOIDS 08/04/2007   LOW BACK PAIN 08/06/2007   scoliosis; DJD; gets injections   Lung cancer (HCC)    OSTEOPENIA 08/04/2007   PAF (paroxysmal atrial fibrillation) (HCC)    a.  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   Assessment:  Doing real good; tolerated the chemo yesterday and feel fine, it was my first  treatment.  The x-ray last week said there was no fluid build up and so I have not needed another thoracentesis since I got home from the hospital.  I feel like I am breathing fine; when I get winded, it's only because I am trying to move too fast, and it goes right away when I stop what I am doing and rest.  Things are going just fine so far    Denies clinical concerns and sounds to be in no distress throughout TOC 30-day program outreach call today  Patient Reported Symptoms: Cognitive Cognitive Status: Normal speech and language skills, Alert and oriented to person, place, and time, Insightful and able to interpret abstract concepts, Requires Assistance Decision Making Cognitive/Intellectual Conditions Management [RPT]: None reported or documented in medical history or problem list      Neurological Neurological Review of Symptoms: No symptoms reported    HEENT HEENT Symptoms Reported: No symptoms reported      Cardiovascular Cardiovascular Symptoms Reported: No symptoms reported, Other: Other Cardiovascular Symptoms: confirmed monitoring and recording blood pressures at home: they have all been normal; confirmed has decreased dose of amlodipine  to 2.5 mg QD (from prior BID) Does patient have uncontrolled Hypertension?: No Cardiovascular Management Strategies: Routine screening, Coping strategies, Adequate rest, Medication therapy, Weight management Do You Have a Working Readable Scale?: Yes Weight: 132 lb (59.9 kg) (home reported weight from 10/17/24)  Respiratory Respiratory Symptoms Reported: No symptoms reported Other Respiratory Symptoms: Denies shortness of breath post- recent hospital discharge 10/07/24: reports thought she had fluid build  up with need for thoracentesis last week but they did an x-ray and told me there was not a build up of fluid, so I did not have to have the lung drained  Reports baseline shortness of breath with activity only: it goes right away as soon as  I stop what I am doing and rest Respiratory Management Strategies: Medication therapy, Weight management, Asthma action plan, Coping strategies, Adequate rest  Endocrine Endocrine Symptoms Reported: No symptoms reported Is patient diabetic?: No    Gastrointestinal Gastrointestinal Symptoms Reported: No symptoms reported Additional Gastrointestinal Details: I had a very good and normal BM yesterday; eating pretty good, appetite fair; no nausea so far after my first chemo treatment yesterday Gastrointestinal Management Strategies: Coping strategies, Medication therapy    Genitourinary Genitourinary Symptoms Reported: No symptoms reported Additional Genitourinary Details: Peeing normally, clear yellow urine    Integumentary Integumentary Symptoms Reported: No symptoms reported    Musculoskeletal Musculoskelatal Symptoms Reviewed: No symptoms reported Additional Musculoskeletal Details: confirmed not currently requiring/ using assistive devices for ambulation; confirmed no new- recent fals post recent hospital discharge on 10/10/24 Musculoskeletal Management Strategies: Adequate rest, Medication therapy, Routine screening Falls in the past year?: No Number of falls in past year: 1 or less Was there an injury with Fall?: No ([name]- no falls reported x > 12 months) Fall Risk Category Calculator: 0 Patient Fall Risk Level: Low Fall Risk Patient at Risk for Falls Due to: Medication side effect Fall risk Follow up: Falls prevention discussed, Education provided  Psychosocial Psychosocial Symptoms Reported: No symptoms reported Behavioral Management Strategies: Support system, Adequate rest, Coping strategies Major Change/Loss/Stressor/Fears (CP): Medical condition, self Techniques to Cope with Loss/Stress/Change: Diversional activities Quality of Family Relationships: helpful, involved, supportive   Today's Vitals   10/17/24 1311  BP: 120/70  Weight: 132 lb (59.9 kg)      Medications  Reviewed Today     Reviewed by Julez Huseby M, RN (Registered Nurse) on 10/17/24 at 1329  Med List Status: <None>   Medication Order Taking? Sig Documenting Provider Last Dose Status Informant  acetaminophen  (TYLENOL ) 500 MG tablet [id number]  Take 500 mg by mouth daily as needed for moderate pain (pain score 4-6) or mild pain (pain score 1-3).  Patient not taking: Reported on 10/12/2024   [provider]  Active Self, Pharmacy Records  ALPRAZolam  (XANAX ) 1 MG tablet [id number]  Take 1 mg by mouth at bedtime. [provider]  Active Self, Pharmacy Records           Med Note (CRUTHIS, CHLOE C   Mon Oct 02, 2024  2:36 PM) No fill hx found for this medication on PMP. Pt and daughter are adamant the pt is taking this medication every night at bedtime.   amLODipine  (NORVASC ) 2.5 MG tablet [id number]  Take 1 tablet (2.5 mg total) by mouth daily. Segal, Jared E, DO  Active   Ascorbic Acid (VITAMIN C) 1000 MG tablet [id number]  Take 1,000 mg by mouth daily. [provider]  Active Self, Pharmacy Records  furosemide  (LASIX ) 20 MG tablet [id number] Yes Take 2 tablets (40 mg total) by mouth daily. Take extra dose as needed for weight gain of 3 LB in 1 day, 5 LB in 1 week Fairy Frames, MD  Active   levothyroxine  (SYNTHROID ) 50 MCG tablet [id number]  TAKE 1 TABLET BY MOUTH ONCE DAILY BEFORE BREAKFAST APPOINTMENT  REQUIRED  FOR  FUTURE  REFILLS [name], MD  Active Self, Pharmacy Records  LUMIGAN 0.01 %  SOLN [id number]  Place 1 drop into both eyes at bedtime.  [provider]  Active Self, Pharmacy Records           Med Note (COX, HEATHER C   Wed Apr 14, 2017  9:08 AM)    metoprolol  succinate (TOPROL -XL) 25 MG 24 hr tablet [id number]  Take 3 tablets (75 mg total) by mouth daily. Fairy Frames, MD  Active   Multiple Vitamins-Minerals (PRESERVISION AREDS 2 PO) [id number]  Take 1 tablet by mouth daily. [provider]  Active Self, Pharmacy Records  Multiple  Vitamins-Minerals (WOMENS 50+ MULTI VITAMIN/MIN PO) [id number]  Take 1 tablet by mouth daily. [provider]  Active Self, Pharmacy Records  Omega-3 Fatty Acids (FISH OIL TRIPLE STRENGTH) 1400 MG CAPS [id number]  Take 1,400 mg by mouth daily. [provider]  Active Self, Pharmacy Records  ondansetron  (ZOFRAN ) 8 MG tablet [id number] Yes Take 1 tablet (8 mg total) by mouth every 8 (eight) hours as needed for nausea or vomiting. Start on the third day after carboplatin . Sherrod Sherrod, MD  Active Self, Pharmacy Records  pantoprazole  (PROTONIX ) 40 MG tablet [id number]  Take 1 tablet (40 mg total) by mouth daily. TAKE 1 TABLET BY MOUTH ONCE DAILY  Patient taking differently: Take 40 mg by mouth daily.   [name], MD  Active Self, Pharmacy Records  Perfluorohexyloctane [facility]) 1.338 GM/ML SOLN [id number]  Place 1.338 mLs into both eyes 4 (four) times daily. [provider]  Active Self, Pharmacy Records  potassium chloride  (KLOR-CON  M) 10 MEQ tablet [id number]  Take 2 tablets (20 mEq total) by mouth every other day. Fairy Frames, MD  Active   prochlorperazine  (COMPAZINE ) 10 MG tablet [id number] Yes Take 1 tablet (10 mg total) by mouth every 6 (six) hours as needed for nausea or vomiting. Sherrod Sherrod, MD  Active Self, Pharmacy Records  Rivaroxaban  (XARELTO ) 15 MG TABS tablet [id number] Yes Take 1 tablet (15 mg total) by mouth daily with supper. Fairy Frames, MD  Active   vitamin k 100 MCG tablet [id number]  Take 100 mcg by mouth daily. [provider]  Active Self, Pharmacy Records           Recommendation:   Specialty provider follow-up: oncology team 10/18/24 and 10/25/24 Continue Current Plan of Care  Follow Up Plan:   Telephone follow-up in 1 week- as scheduled 10/24/24  Pls call/ message for questions,  Madi Bonfiglio Mckinney Ajay Strubel, RN, BSN, CCRN Alumnus RN Care Manager  Transitions of Care  [facility] - [facility] [phone]:  direct office

## 2024-10-18 ENCOUNTER — Inpatient Hospital Stay

## 2024-10-18 ENCOUNTER — Encounter: Payer: Self-pay | Admitting: Nurse Practitioner

## 2024-10-18 VITALS — BP 91/54 | HR 55 | Temp 97.7°F | Resp 18 | Ht 66.0 in | Wt 133.0 lb

## 2024-10-18 DIAGNOSIS — Z515 Encounter for palliative care: Secondary | ICD-10-CM

## 2024-10-18 DIAGNOSIS — C3411 Malignant neoplasm of upper lobe, right bronchus or lung: Secondary | ICD-10-CM

## 2024-10-18 DIAGNOSIS — Z7189 Other specified counseling: Secondary | ICD-10-CM | POA: Diagnosis not present

## 2024-10-18 DIAGNOSIS — R530 Neoplastic (malignant) related fatigue: Secondary | ICD-10-CM

## 2024-10-18 MED ORDER — PEGFILGRASTIM-JMDB 6 MG/0.6ML ~~LOC~~ SOSY
6.0000 mg | PREFILLED_SYRINGE | Freq: Once | SUBCUTANEOUS | Status: AC
  Administered 2024-10-18: 6 mg via SUBCUTANEOUS
  Filled 2024-10-18: qty 0.6

## 2024-10-18 NOTE — Patient Instructions (Signed)
 Take 1 Claritin (Loratadine) pill daily for 7 days.

## 2024-10-18 NOTE — Progress Notes (Signed)
 Palliative Medicine [facility]  [phone] [phone]   Name: Carolyn Wells Date: 10/18/2024 MRN: [id number]  DOB: 31-May-1941  Patient Care Team: [name], MD as PCP - General (Internal Medicine) Kriste Emeline BRAVO, DO as PCP - Cardiology (Cardiology) Abran Norleen SAILOR, MD as Consulting Physician (Gastroenterology) Prentis Duwaine BROCKS, Wells as Oncology Nurse Navigator Abelino Beatris CHRISTELLA, Wells as VBCI Care Management    REASON FOR CONSULTATION: Carolyn Wells is a 83 y.o. female with oncologic medical history including recently diagnosed lung cancer (08/2024) with mediastinal, right hilar, right supraclavicular, and right axillary adenopathy consistent with metastatic disease. Palliative is seeing patient for symptom management and goals of care.    SOCIAL HISTORY:     reports that she has never smoked. She has never been exposed to tobacco smoke. She has never used smokeless tobacco. She reports that she does not drink alcohol and does not use drugs.  ADVANCE DIRECTIVES:  Patient has a documented directive. Her daughter is her clinical research associate. No life sustaining treatment including artificial feedings.   CODE STATUS: DNR  PAST MEDICAL HISTORY: Past Medical History:  Diagnosis Date   ABDOMINAL INCISIONAL HERNIA 08/13/2009   s/p repair   Allergic rhinitis, cause unspecified 09/28/2011   BRADYCARDIA 09/26/2010   CKD (chronic kidney disease), stage III (HCC) 02/26/2015   Solitary kidney   COLONIC POLYPS, HX OF 08/25/2007   Complication of anesthesia    DIVERTICULOSIS, COLON 08/25/2007   Dysrhythmia    GASTROESOPHAGEAL REFLUX DISEASE 08/04/2007   HYPERLIPIDEMIA 09/27/2010   not on Rx therapy   HYPERTENSION 08/06/2007   Hypothyroidism    INTERNAL HEMORRHOIDS 08/04/2007   LOW BACK PAIN 08/06/2007   scoliosis; DJD; gets injections   Lung cancer (HCC)    OSTEOPENIA 08/04/2007   PAF (paroxysmal atrial fibrillation) (HCC)    a.   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    PAST SURGICAL HISTORY:  Past Surgical History:  Procedure Laterality Date   CARDIOVERSION [name] 08/30/2024   Procedure: CARDIOVERSION;  Surgeon: Francyne Headland, MD;  Location: [facility];  Service: Cardiovascular;  Laterality: [name];   IR IMAGING GUIDED PORT INSERTION  10/09/2024   IR PERC PLEURAL DRAIN W/INDWELL CATH W/IMG GUIDE  10/04/2024   IR THORACENTESIS ASP PLEURAL SPACE W/IMG GUIDE  09/06/2024   IR THORACENTESIS ASP PLEURAL SPACE W/IMG GUIDE  09/14/2024   IR THORACENTESIS ASP PLEURAL SPACE W/IMG GUIDE  09/28/2024   LAMINECTOMY  1999   L4-5/lumbar   NEPHRECTOMY     Right   s/o incisional hernia repair     RUQ   STRABISMUS SURGERY Bilateral 04/14/2019   Procedure: STRABISMUS REPAIR BILATERAL;  Surgeon: Neysa Fallow, MD;  Location: [facility];  Service: Ophthalmology;  Laterality: Bilateral;   TONSILLECTOMY      HEMATOLOGY/ONCOLOGY HISTORY:  Oncology History  Primary adenocarcinoma of upper lobe of right lung (HCC)  09/26/2024 Initial Diagnosis   Primary adenocarcinoma of upper lobe of right lung (HCC)   09/26/2024 Cancer Staging   Staging form: Lung, AJCC V9 - Clinical: Stage IVA (cT2b, cN3, cM1a) - Signed by Sherrod Sherrod, MD on 09/26/2024 Method of lymph node assessment: Clinical   10/16/2024 -  Chemotherapy   Patient is on Treatment Plan :  LUNG NSCLC Carboplatin  (6) + Paclitaxel  (200) + Pembrolizumab  (200) D1 q21d x 4 cycles / Pembrolizumab  (200) Maintenance D1 q21d       ALLERGIES:  is allergic to budesonide, elavil  [amitriptyline ], gabapentin , tramadol , hydrocodone, and ipratropium.  MEDICATIONS:  Current Outpatient Medications  Medication Sig Dispense Refill   acetaminophen  (TYLENOL ) 500 MG tablet Take 500 mg by  mouth daily as needed for moderate pain (pain score 4-6) or mild pain (pain score 1-3). (Patient not taking: Reported on 10/12/2024)     ALPRAZolam  (XANAX ) 1 MG tablet Take 1 mg by mouth at bedtime.     amLODipine  (NORVASC ) 2.5 MG tablet Take 1 tablet (2.5 mg total) by mouth daily.     Ascorbic Acid (VITAMIN C) 1000 MG tablet Take 1,000 mg by mouth daily.     furosemide  (LASIX ) 20 MG tablet Take 2 tablets (40 mg total) by mouth daily. Take extra dose as needed for weight gain of 3 LB in 1 day, 5 LB in 1 week 60 tablet 0   levothyroxine  (SYNTHROID ) 50 MCG tablet TAKE 1 TABLET BY MOUTH ONCE DAILY BEFORE BREAKFAST APPOINTMENT  REQUIRED  FOR  FUTURE  REFILLS 90 tablet 0   LUMIGAN 0.01 % SOLN Place 1 drop into both eyes at bedtime.      metoprolol  succinate (TOPROL -XL) 25 MG 24 hr tablet Take 3 tablets (75 mg total) by mouth daily. 90 tablet 0   Multiple Vitamins-Minerals (PRESERVISION AREDS 2 PO) Take 1 tablet by mouth daily.     Multiple Vitamins-Minerals (WOMENS 50+ MULTI VITAMIN/MIN PO) Take 1 tablet by mouth daily.     Omega-3 Fatty Acids (FISH OIL TRIPLE STRENGTH) 1400 MG CAPS Take 1,400 mg by mouth daily.     ondansetron  (ZOFRAN ) 8 MG tablet Take 1 tablet (8 mg total) by mouth every 8 (eight) hours as needed for nausea or vomiting. Start on the third day after carboplatin . 30 tablet 1   pantoprazole  (PROTONIX ) 40 MG tablet Take 1 tablet (40 mg total) by mouth daily. TAKE 1 TABLET BY MOUTH ONCE DAILY (Patient taking differently: Take 40 mg by mouth daily.) 90 tablet 3   Perfluorohexyloctane (MIEBO) 1.338 GM/ML SOLN Place 1.338 mLs into both eyes 4 (four) times daily.     potassium chloride  (KLOR-CON  M) 10 MEQ tablet Take 2 tablets (20 mEq total) by mouth every other day. 60 tablet 0   prochlorperazine  (COMPAZINE ) 10 MG tablet Take 1 tablet (10 mg total) by mouth every 6 (six) hours as needed for nausea or vomiting. 30 tablet 1   [MAR Hold] Rivaroxaban  (XARELTO ) 15 MG TABS tablet Take 1 tablet (15 mg  total) by mouth daily with supper.     vitamin k 100 MCG tablet Take 100 mcg by mouth daily.     No current facility-administered medications for this visit.    VITAL SIGNS: BP (!) 91/54 (BP Location: Left Arm, Patient Position: Sitting)   Pulse (!) 55   Temp 97.7 F (36.5 C) (Tympanic)   Resp 18   Ht 5' 6 (1.676 m)   Wt 133 lb (60.3 kg)   SpO2 100%   BMI 21.47 kg/m  Filed Weights   10/18/24 1413  Weight: 133 lb (60.3 kg)    Estimated body mass index is 21.47 kg/m as calculated from the following:   Height as of this encounter: 5' 6 (1.676 m).   Weight as of this encounter: 133 lb (60.3 kg).  LABS: CBC:    Component Value Date/Time  WBC 8.4 10/16/2024 0741   WBC 8.7 10/07/2024 0249   HGB 9.1 (L) 10/16/2024 0741   HGB 12.8 12/03/2020 1204   HCT 28.2 (L) 10/16/2024 0741   HCT 37.5 12/03/2020 1204   PLT 367 10/16/2024 0741   PLT 183 12/03/2020 1204   MCV 87.9 10/16/2024 0741   MCV 96 12/03/2020 1204   NEUTROABS 6.7 10/16/2024 0741   NEUTROABS 6.0 12/03/2020 1204   LYMPHSABS 0.7 10/16/2024 0741   LYMPHSABS 1.6 12/03/2020 1204   MONOABS 0.8 10/16/2024 0741   EOSABS 0.2 10/16/2024 0741   EOSABS 0.1 12/03/2020 1204   BASOSABS 0.1 10/16/2024 0741   BASOSABS 0.0 12/03/2020 1204   Comprehensive Metabolic Panel:    Component Value Date/Time   NA 139 10/16/2024 0741   NA 141 02/02/2023 1406   K 4.3 10/16/2024 0741   CL 98 10/16/2024 0741   CO2 28 10/16/2024 0741   BUN 44 (H) 10/16/2024 0741   BUN 28 (H) 02/02/2023 1406   CREATININE 2.06 (H) 10/16/2024 0741   CREATININE 1.36 (H) 02/07/2016 0942   GLUCOSE 159 (H) 10/16/2024 0741   CALCIUM  9.7 10/16/2024 0741   AST 28 10/16/2024 0741   ALT 11 10/16/2024 0741   ALKPHOS 114 10/16/2024 0741   BILITOT 0.3 10/16/2024 0741   PROT 6.8 10/16/2024 0741   ALBUMIN 3.6 10/16/2024 0741    RADIOGRAPHIC STUDIES: IR US  CHEST Result Date: 10/12/2024 INDICATION: 83 year old with lung cancer and recurrent right pleural  effusion. Recently underwent pleur-x drain placement 10/04/24 with 1.7 L removed at placement; however, the drain was removed per patient request for discomfort related to drain. Therapeutic thoracentesis requested today for increased SOB noted by outpatient provider. EXAM: CHEST ULTRASOUND COMPARISON:  None Available. FINDINGS: Minimal right-sided pleural effusion. No significant pocket of fluid or percutaneous window to allow safe thoracentesis. Risks outweigh the benefits. IMPRESSION: Small right-sided pleural effusion with no safe window for percutaneous access Performed by Laymon Coast, NP Electronically Signed   By: Ester Sides M.D.   On: 10/12/2024 15:08   IR PERC PLEURAL DRAIN W/INDWELL CATH W/IMG GUIDE Addendum Date: 10/10/2024 ADDENDUM REPORT: 10/10/2024 10:31 ADDENDUM: Correction to the moderate sedation time. The sedation time was 25 minutes. Electronically Signed   By: Juliene Balder M.D.   On: 10/10/2024 10:31   Result Date: 10/10/2024 INDICATION: 83 year old with lung cancer and recurrent right pleural effusion. EXAM: PLACEMENT OF TUNNELED PLEURAL DRAINAGE CATHETER WITH ULTRASOUND AND FLUOROSCOPIC GUIDANCE MEDICATIONS: Ancef  2 g ANESTHESIA/SEDATION: Moderate (conscious) sedation was employed during this procedure. A total of Versed  1.5 mg and Fentanyl  50 mcg was administered intravenously by the radiology nurse. Total intra-service moderate Sedation Time: 15 minutes. The patient's level of consciousness and vital signs were monitored continuously by radiology nursing throughout the procedure under my direct supervision. COMPLICATIONS: None immediate. FLUOROSCOPY: Radiation Exposure Index (as provided by the fluoroscopic device): 1 mGy Kerma PROCEDURE: Informed written consent was obtained from the patient after a thorough discussion of the procedural risks, benefits and alternatives. All questions were addressed. Maximal Sterile Barrier Technique was utilized including caps, mask, sterile  gowns, sterile gloves, sterile drape, hand hygiene and skin antiseptic. A timeout was performed prior to the initiation of the procedure. Ultrasound demonstrated an adequate amount of right pleural fluid. The right side of the chest was prepped and draped in sterile fashion. Skin was anesthetized with 1% lidocaine  into two spots. Two small incisions were made. Using ultrasound guidance, a thoracentesis catheter was directed into the pleural fluid along the  more lateral incision. Fluid was aspirated from the catheter. PleurX catheter was tunneled between the 2 incisions. The cuff was placed underneath the skin. Wire was placed through the thoracentesis catheter and wire was advanced into the pleural space under fluoroscopy. Catheter was removed over the wire and the tract was dilated to accommodate a peel-away sheath. PleurX was placed through the peel-away sheath and advanced into the pleural space. Pleural fluid was removed from the PleurX catheter. The thoracentesis skin access site was closed using absorbable suture and Dermabond. Catheter was secured to skin with suture. Dressing was placed. Fluoroscopic and ultrasound images were taken and saved for documentation. FINDINGS: 1.7 L of yellow pleural fluid was removed. IMPRESSION: Successful placement of a tunneled right pleural drainage catheter using ultrasound and fluoroscopic guidance. Electronically Signed: By: Juliene Balder M.D. On: 10/04/2024 19:49   IR IMAGING GUIDED PORT INSERTION Result Date: 10/09/2024 INDICATION: 83 year old female with history of advanced stage lung cancer requiring central venous access for chemotherapy administration. EXAM: IMPLANTED PORT A CATH PLACEMENT WITH ULTRASOUND AND FLUOROSCOPIC GUIDANCE COMPARISON:  None Available. MEDICATIONS: None. ANESTHESIA/SEDATION: Moderate (conscious) sedation was employed during this procedure. A total of Versed  1.5 mg and Fentanyl  75 mcg was administered intravenously. Moderate Sedation Time: 10  minutes. The patient's level of consciousness and vital signs were monitored continuously by radiology nursing throughout the procedure under my direct supervision. CONTRAST:  None FLUOROSCOPY TIME:  One mGy reference air kerma COMPLICATIONS: None immediate. PROCEDURE: The procedure, risks, benefits, and alternatives were explained to the patient. Questions regarding the procedure were encouraged and answered. The patient understands and consents to the procedure. The right neck and chest were prepped with chlorhexidine in a sterile fashion, and a sterile drape was applied covering the operative field. Maximum barrier sterile technique with sterile gowns and gloves were used for the procedure. A timeout was performed prior to the initiation of the procedure. Ultrasound was used to examine the jugular vein which was compressible and free of internal echoes. A skin marker was used to demarcate the planned venotomy and port pocket incision sites. Local anesthesia was provided to these sites and the subcutaneous tunnel track with 1% lidocaine  with 1:100,000 epinephrine . A small incision was created at the jugular access site and blunt dissection was performed of the subcutaneous tissues. Under ultrasound guidance, the jugular vein was accessed with a 21 ga micropuncture needle and an 0.018 wire was inserted to the superior vena cava. Real-time ultrasound guidance was utilized for vascular access including the acquisition of a permanent ultrasound image documenting patency of the accessed vessel. A 5 Fr micopuncture set was then used, through which a 0.035 Rosen wire was passed under fluoroscopic guidance into the inferior vena cava. An 8 Fr dilator was then placed over the wire. A subcutaneous port pocket was then created along the upper chest wall utilizing a combination of sharp and blunt dissection. The pocket was irrigated with sterile saline, packed with gauze, and observed for hemorrhage. A single lumen clear  view power injectable port was chosen for placement. The 8 Fr catheter was tunneled from the port pocket site to the venotomy incision. The port was placed in the pocket. The external catheter was trimmed to appropriate length. The dilator was exchanged for an 8 Fr peel-away sheath under fluoroscopic guidance. The catheter was then placed through the sheath and the sheath was removed. Final catheter positioning was confirmed and documented with a fluoroscopic spot radiograph. The port was accessed with a Huber needle, aspirated,  and flushed with heparinized saline. The deep dermal layer of the port pocket incision was closed with interrupted 3-0 Vicryl suture. Dermabond was then placed over the port pocket and neck incisions. The patient tolerated the procedure well without immediate post procedural complication. FINDINGS: After catheter placement, the tip lies within the superior cavoatrial junction. The catheter aspirates and flushes normally and is ready for immediate use. IMPRESSION: Successful placement of a power injectable Port-A-Cath via the right internal jugular vein. The catheter is ready for immediate use. Ester Sides, MD [facility] Electronically Signed   By: Ester Sides M.D.   On: 10/09/2024 13:03   DG Chest Port 1 View Result Date: 10/07/2024 CLINICAL DATA:  Pleural effusion.  Lung carcinoma. EXAM: PORTABLE CHEST 1 VIEW COMPARISON:  10/06/2024 FINDINGS: Heart size remains within normal limits. Atelectasis or infiltrate in left lung base and tiny left pleural effusion show no significant change. No pneumothorax visualized. Pulmonary hyperinflation again seen, consistent with COPD. 4.6 cm mass in the medial right upper lobe is unchanged. IMPRESSION: No significant change in left basilar atelectasis or infiltrate, and tiny left pleural effusion. Stable right upper lobe mass. COPD. Electronically Signed   By: Norleen DELENA Kil M.D.   On:  10/07/2024 07:24   DG Chest Port 1 View Result Date: 10/06/2024 CLINICAL DATA:  142230 Pleural effusion 142230 EXAM: PORTABLE CHEST - 1 VIEW COMPARISON:  October 06, 2026 FINDINGS: Redemonstrated medial right apical mass measuring 4.3 cm, unchanged. Trace right pleural effusion. Interval removal of the right-sided thoracostomy tube. No pneumothorax. No new airspace consolidation. Trace left pleural effusion. No cardiomegaly. Tortuous aorta. No acute fracture or destructive lesions. Multilevel thoracic osteophytosis. Osteopenia. IMPRESSION: 1. Interval removal of the right-sided thoracostomy tube. No pneumothorax. 2. Similar trace bilateral pleural effusions. Electronically Signed   By: Rogelia Myers M.D.   On: 10/06/2024 18:00   DG Chest Port 1 View Result Date: 10/06/2024 CLINICAL DATA:  Pleural effusion. History of lung cancer and recent placement of a PleurX catheter. EXAM: PORTABLE CHEST 1 VIEW COMPARISON:  10/04/2024 and chest CT 10/05/2024 FINDINGS: Right chest PleurX catheter is stable with the tip near the right lung apex. Right pneumothorax is not confidently identified. Again noted is the mass in the medial right upper lung. Mild haziness at the right lung base may represent some re-accumulation of pleural fluid. Persistent densities at left lung base compatible with small left pleural effusion. Heart size is stable. IMPRESSION: 1. Stable position of the right chest PleurX catheter. Right pneumothorax is not confidently identified. 2. Mild haziness at the right lung base may represent some re-accumulation of pleural fluid. 3. Stable small left pleural effusion. Electronically Signed   By: Juliene Balder M.D.   On: 10/06/2024 08:30   CT CHEST WO CONTRAST Result Date: 10/05/2024 CLINICAL DATA:  History of lung cancer. Chest pain. * Tracking Code: BO * EXAM: CT CHEST WITHOUT CONTRAST TECHNIQUE: Multidetector CT imaging of the chest was performed following the standard protocol without IV contrast.  RADIATION DOSE REDUCTION: This exam was performed according to the departmental dose-optimization program which includes automated exposure control, adjustment of the mA and/or kV according to patient size and/or use of iterative reconstruction technique. COMPARISON:  Chest radiograph dated 10/04/2024, CT chest dated 09/05/2024, nuclear medicine PET dated 09/19/2024 FINDINGS: Cardiovascular: Multichamber cardiomegaly. No significant pericardial fluid/thickening. Unchanged dilated ascending thoracic aorta measures 4.0 x 4.0 cm. Dilated main pulmonary artery measures 3.7 cm. Coronary artery calcifications and aortic atherosclerosis. Mediastinum/Nodes: Imaged  thyroid  gland without nodules meeting criteria for imaging follow-up by size. Small hiatal hernia. Unchanged multi station lymphadenopathy, including 14 mm right subpectoral (3:31, 14 mm high left paratracheal (3:22), 19 mm precarinal (3:65), and 11 mm right hilar (3:75, remeasured). Lungs/Pleura: The central airways are patent. Mild centrilobular and paraseptal emphysema. 4.8 x 3.5 cm medial right apical mass (4:37), not substantially changed. Unchanged inferior right upper lobe ground-glass nodules measuring up to 7 mm (4:71) and solid right lower lobe nodules measuring up to 5 mm (4:104, 107). Triangular 4 x 3 mm right apical nodule (4:30) is new compared to 09/05/2024. Unchanged small foci of tree-in-bud nodules within the medial left upper lobe (4:66). Subsegmental right middle lobe and lingular atelectasis, unchanged. Increased left lower lobe relaxation atelectasis. Interval resolution of previously noted anterior left lower lobe consolidation. Scattered calcified pulmonary nodules and subsegmental mucous plugging. Unchanged trace right apical pneumothorax status post placement of right inferolateral approach pleural catheter with tip terminating at the level of the lateral apex. Decreased trace right pleural effusion. Slightly increased small left pleural  effusion. Upper abdomen: Normal. Musculoskeletal: No acute or abnormal lytic or blastic osseous lesions. Multilevel degenerative changes of the thoracic spine. Trace right lateral chest wall subcutaneous emphysema at the site of catheter insertion. IMPRESSION: 1. Unchanged trace right apical pneumothorax status post placement of right inferolateral approach pleural catheter with tip terminating at the level of the lateral apex. Decreased trace right pleural effusion. 2. Slightly increased small left pleural effusion with increased left lower lobe relaxation atelectasis. 3. Unchanged 4.8 cm medial right apical mass and multistation lymphadenopathy. 4. New triangular 4 x 3 mm right apical nodule, which may be infectious/inflammatory. 5. Unchanged dilated ascending thoracic aorta measures 4.0 x 4.0 cm. Recommend annual imaging followup by CTA or MRA. This recommendation follows 2010 ACCF/AHA/AATS/ACR/ASA/SCA/SCAI/SIR/STS/SVM Guidelines for the Diagnosis and Management of Patients with Thoracic Aortic Disease. Circulation. 2010; 121: Z733-z630. Aortic aneurysm NOS (ICD10-I71.9) 6. Dilated main pulmonary artery measures 3.7 cm, which can be seen in the setting of pulmonary arterial hypertension. Aortic Atherosclerosis (ICD10-I70.0) and Emphysema (ICD10-J43.9). Coronary artery calcifications. Assessment for potential risk factor modification, dietary therapy or pharmacologic therapy may be warranted, if clinically indicated. Electronically Signed   By: Limin  Xu M.D.   On: 10/05/2024 13:18   DG Chest Port 1 View Result Date: 10/04/2024 CLINICAL DATA:  Lung cancer and recent placement of right chest PleurX catheter. EXAM: PORTABLE CHEST 1 VIEW COMPARISON:  Chest radiograph 10/02/2024 FINDINGS: PleurX catheter in the right chest with the tip near the apex. Evidence for small right apical pneumothorax. Right pleural effusion has been removed. Markedly improved aeration in the right lung. Patient has a mass along the  medial aspect of the right upper lung measuring up to 4.2 cm. Persistent densities at the left lung base compatible with left pleural fluid. Heart size is stable and within normal limits. IMPRESSION: 1. Placement of a right chest PleurX catheter. Right pleural effusion has essentially resolved. Evidence for a small right apical pneumothorax. 2. Small left pleural effusion. 3. Right lung mass. Electronically Signed   By: Juliene Balder M.D.   On: 10/04/2024 19:55   DG Chest Portable 1 View Result Date: 10/02/2024 CLINICAL DATA:  Shortness of breath. EXAM: PORTABLE CHEST 1 VIEW COMPARISON:  Chest radiograph dated 09/28/2024. FINDINGS: Bilateral pleural effusions, right greater than left, and increased since the prior radiograph. There is bibasilar atelectasis or pneumonia. No pneumothorax. Stable cardiac silhouette. No acute osseous pathology. IMPRESSION: Bilateral pleural effusions,  right greater than left, and increased since the prior radiograph. Electronically Signed   By: Vanetta Chou M.D.   On: 10/02/2024 13:14   IR THORACENTESIS ASP PLEURAL SPACE W/IMG GUIDE Result Date: 09/28/2024 INDICATION: History of right lung cancer with recurrent right pleural effusion. Request for therapeutic right thoracentesis. EXAM: ULTRASOUND GUIDED THERAPEUTIC RIGHT THORACENTESIS MEDICATIONS: 7 mL 1% lidocaine  COMPLICATIONS: None immediate. PROCEDURE: An ultrasound guided thoracentesis was thoroughly discussed with the patient and questions answered. The benefits, risks, alternatives and complications were also discussed. The patient understands and wishes to proceed with the procedure. Written consent was obtained. Ultrasound was performed to localize and mark an adequate pocket of fluid in the right chest. The area was then prepped and draped in the normal sterile fashion. 1% Lidocaine  was used for local anesthesia. Under ultrasound guidance a 6 Fr Safe-T-Centesis catheter was introduced. Thoracentesis was performed. The  catheter was removed and a dressing applied. FINDINGS: A total of approximately 750 mL of dark yellow fluid was removed. IMPRESSION: Successful ultrasound guided right thoracentesis yielding 750 mL of pleural fluid. Performed and dictated by Kimble Clas, PA-C Electronically Signed   By: Marcey Moan M.D.   On: 09/28/2024 14:21   DG Chest 1 View Result Date: 09/28/2024 CLINICAL DATA:  Follow-up pleural effusion. Known right upper lobe lung cancer. EXAM: CHEST  1 VIEW COMPARISON:  Chest x-ray 09/25/2024, PET-CT 09/19/2024 FINDINGS: Lungs are somewhat hypoinflated demonstrate evidence of small bilateral pleural effusions likely with associated bibasilar atelectasis without significant change. Stable known right lung cancer over the right upper lobe/right paramediastinal region. Cardiac silhouette is normal. Remainder of the exam is unchanged. IMPRESSION: 1. Stable small bilateral pleural effusions with associated bibasilar atelectasis. 2. Stable known right upper lobe lung cancer. Electronically Signed   By: Toribio Agreste M.D.   On: 09/28/2024 14:06   DG Chest 2 View Result Date: 09/25/2024 EXAM: 2 VIEW(S) XRAY OF THE CHEST 09/25/2024 12:37:00 PM COMPARISON: Paracin colon 09/20/2024. CLINICAL HISTORY: chest pain and shortness of breath with Hx of pleural effusions and lung cancer FINDINGS: LUNGS AND PLEURA: Stable bilateral pleural effusions. Bibasilar airspace opacities primarily representing atelectasis; a component of pneumonia cannot be excluded. Right upper lobe paramediastinal mass noted unchanged. Low lung volumes are present, causing crowding of the pulmonary vasculature. No pneumothorax. HEART AND MEDIASTINUM: No acute abnormality of the cardiac and mediastinal silhouettes. BONES AND SOFT TISSUES: No acute osseous abnormality. IMPRESSION: 1. Stable bilateral pleural effusions and bibasilar airspace opacities, primarily representing atelectasis. Pneumonia cannot be excluded. 2. Unchanged right  upper lobe paramediastinal mass. 3. Low lung volumes causing crowding of the pulmonary vasculature. Electronically signed by: Ryan Salvage MD 09/25/2024 01:42 PM EST RP Workstation: [id number]   ECHOCARDIOGRAM COMPLETE Result Date: 09/22/2024    ECHOCARDIOGRAM REPORT   Patient Name:   Carolyn Wells Date of Exam: 09/22/2024 Medical Rec #:  [id number]         Height:       66.0 in Accession #:    [id number]        Weight:       143.6 lb Date of Birth:  05-Jul-1941         BSA:          1.737 m Patient Age:    83 years          BP:           132/80 mmHg Patient Gender: F  HR:           80 bpm. Exam Location:  [address] Procedure: 2D Echo, Cardiac Doppler and Color Doppler (Both Spectral and Color            Flow Doppler were utilized during procedure). Indications:    I48.91 Atrial fibrillation  History:        Patient has prior history of Echocardiogram examinations, most                 recent 01/07/2023. Pleural effusion, Lung cancer,                 Arrythmias:Atrial Fibrillation, Signs/Symptoms:Shortness of                 Breath; Risk Factors:Hypertension and Dyslipidemia.  Sonographer:    Elsie Bohr RDCS Referring Phys: [id number] JARED E SEGAL IMPRESSIONS  1. Left ventricular ejection fraction, by estimation, is 55 to 60%. The left ventricle has normal function. The left ventricle has no regional wall motion abnormalities. There is mild concentric left ventricular hypertrophy. Left ventricular diastolic function could not be evaluated.  2. Right ventricular systolic function is normal. The right ventricular size is normal.  3. Left atrial size was mildly dilated.  4. Right atrial size was mildly dilated.  5. Moderate pleural effusion in the left lateral region.  6. The mitral valve is normal in structure. Trivial mitral valve regurgitation. No evidence of mitral stenosis.  7. The aortic valve is tricuspid. Aortic valve regurgitation is mild. Aortic valve sclerosis is present, with  no evidence of aortic valve stenosis.  8. The inferior vena cava is normal in size with greater than 50% respiratory variability, suggesting right atrial pressure of 3 mmHg. FINDINGS  Left Ventricle: Left ventricular ejection fraction, by estimation, is 55 to 60%. The left ventricle has normal function. The left ventricle has no regional wall motion abnormalities. The left ventricular internal cavity size was normal in size. There is  mild concentric left ventricular hypertrophy. Left ventricular diastolic function could not be evaluated due to atrial fibrillation. Left ventricular diastolic function could not be evaluated. Right Ventricle: The right ventricular size is normal. Right ventricular systolic function is normal. Left Atrium: Left atrial size was mildly dilated. Right Atrium: Right atrial size was mildly dilated. Pericardium: Trivial pericardial effusion is present. Mitral Valve: The mitral valve is normal in structure. Trivial mitral valve regurgitation. No evidence of mitral valve stenosis. Tricuspid Valve: The tricuspid valve is normal in structure. Tricuspid valve regurgitation is trivial. No evidence of tricuspid stenosis. Aortic Valve: The aortic valve is tricuspid. Aortic valve regurgitation is mild. Aortic regurgitation PHT measures 571 msec. Aortic valve sclerosis is present, with no evidence of aortic valve stenosis. Pulmonic Valve: The pulmonic valve was normal in structure. Pulmonic valve regurgitation is trivial. No evidence of pulmonic stenosis. Aorta: The aortic root is normal in size and structure. Venous: The inferior vena cava is normal in size with greater than 50% respiratory variability, suggesting right atrial pressure of 3 mmHg. IAS/Shunts: No atrial level shunt detected by color flow Doppler. Additional Comments: There is a moderate pleural effusion in the left lateral region.  LEFT VENTRICLE PLAX 2D LVIDd:         4.30 cm LVIDs:         2.90 cm LV PW:         1.20 cm LV IVS:         1.20 cm LVOT diam:     1.90  cm LV SV:         48 LV SV Index:   28 LVOT Area:     2.84 cm  IVC IVC diam: 1.90 cm LEFT ATRIUM             Index        RIGHT ATRIUM           Index LA diam:        4.40 cm 2.53 cm/m   RA Area:     13.90 cm LA Vol (A2C):   46.9 ml 27.00 ml/m  RA Volume:   38.60 ml  22.22 ml/m LA Vol (A4C):   46.5 ml 26.77 ml/m LA Biplane Vol: 47.3 ml 27.23 ml/m  AORTIC VALVE LVOT Vmax:   84.60 cm/s LVOT Vmean:  54.720 cm/s LVOT VTI:    0.171 m AI PHT:      571 msec  AORTA Ao Root diam: 3.10 cm Ao Asc diam:  3.50 cm MITRAL VALVE                TRICUSPID VALVE MV Area (PHT): 4.16 cm     TR Peak grad:   32.9 mmHg MV Decel Time: 182 msec     TR Vmax:        287.00 cm/s MV E velocity: 114.40 cm/s                             SHUNTS                             Systemic VTI:  0.17 m                             Systemic Diam: 1.90 cm Redell Shallow MD Electronically signed by Redell Shallow MD Signature Date/Time: 09/22/2024/8:57:10 AM    Final    DG Chest 2 View Result Date: 09/20/2024 EXAM: 2 VIEW(S) XRAY OF THE CHEST 09/20/2024 10:58:24 AM COMPARISON: 09/17/2024 CLINICAL HISTORY: pleural effusion FINDINGS: LUNGS AND PLEURA: Similarly appearing, right upper lobe mass abutting the mediastinum. Small left pleural effusion. Streaky opacities in both lung bases, likely atelectasis. No pulmonary edema. No pneumothorax. HEART AND MEDIASTINUM: The right upper lobe mass abuts the mediastinum. No acute abnormality of the cardiac silhouette. BONES AND SOFT TISSUES: No acute osseous abnormality. IMPRESSION: 1. Streaky bibasilar airspace opacities, likely atelectasis. Trace left pleural effusion. 2. Right upper lobe mass abutting the mediastinum, similar to prior exam. Electronically signed by: Rogelia Myers MD 09/20/2024 11:31 AM EST RP Workstation: [id number]   NM PET Image Initial (PI) Skull Base To Thigh Result Date: 09/20/2024 CLINICAL DATA:  Initial treatment strategy for non-small cell lung  cancer. EXAM: NUCLEAR MEDICINE PET SKULL BASE TO THIGH TECHNIQUE: 7.1 mCi F-18 FDG was injected intravenously. Full-ring PET imaging was performed from the skull base to thigh after the radiotracer. CT data was obtained and used for attenuation correction and anatomic localization. Fasting blood glucose: 89 mg/dl COMPARISON:  CT chest 89/71/7974. FINDINGS: Mediastinal blood pool activity: SUV max 2.8 Liver activity: SUV max NA NECK: No abnormal hypermetabolism. Incidental CT findings: None. CHEST: Hypermetabolic supraclavicular, mediastinal, left hilar, right axillary, right internal mammary and prepericardiac lymph nodes. Index AP window lymph node measures 1.4 cm (4/68), SUV max 8.4. Medial right upper lobe mass has a long border of contact with the adjacent mediastinum and  measures 3.0 x 4.4 cm with SUV max 12.1. Hypermetabolic nodular consolidation in the anterolateral left lower lobe measures approximately 10 mm (7/59), SUV max 3.1. No additional abnormal hypermetabolism. Incidental CT findings: Atherosclerotic calcification of the aorta, aortic valve and coronary arteries. Heart is enlarged. No pericardial effusion. Moderate right and small left pleural effusions. Volume loss in the lateral segment right middle lobe and inferior lingula. ABDOMEN/PELVIS: No abnormal hypermetabolism. Incidental CT findings: Right kidney is absent. Low-attenuation lesion in the left kidney. No specific follow-up necessary. SKELETON: No abnormal hypermetabolism. Incidental CT findings: Degenerative changes in the spine. IMPRESSION: 1. Hypermetabolic right upper lobe mass with hypermetabolic adenopathy extending to the left supraclavicular region, findings compatible with at least T2bN3M0 or stage IIIB primary bronchogenic carcinoma. Hypermetabolic nodular consolidation in the anterior left lower lobe may be infectious/inflammatory in etiology. Metastatic disease or stage IVA disease cannot be definitively excluded. 2. Moderate  right and small left pleural effusions. 3. Aortic atherosclerosis (ICD10-I70.0). Coronary artery calcification. Electronically Signed   By: Newell Eke M.D.   On: 09/20/2024 11:10   MR BRAIN W WO CONTRAST Result Date: 09/20/2024 CLINICAL DATA:  Non-small cell lung cancer.  Staging. EXAM: MRI HEAD WITHOUT AND WITH CONTRAST TECHNIQUE: Multiplanar, multiecho pulse sequences of the brain and surrounding structures were obtained without and with intravenous contrast. CONTRAST:  6.5mL GADAVIST  GADOBUTROL  1 MMOL/ML IV SOLN COMPARISON:  Head CT 06/27/2023 FINDINGS: Brain: No focal finding affects the brainstem or cerebellum. Mild cerebellar volume loss, age related. Cerebral hemispheres show age related volume loss without subjective lobar predominance. There is a background pattern of mild chronic small-vessel ischemic change of the deep white matter. There are 3 subcentimeter foci of restricted diffusion in the right frontal lobe. Two of these are punctate and the larger focus in the medial inferior frontal lobe measures up to 8 mm. There is no edema or enhancement associated with these foci and they are favored to represent small embolic strokes. Nonenhancing metastases are possible but less likely. The clustered nature favors vascular pathology. No evidence of any other brain metastasis. There is an incidental meningioma measuring 1.8 cm in diameter at the frontoparietal vertex on the left. Minimal indentation of the surface of the brain but no significant mass effect. Broad surface along the superior sagittal sinus but without evidence of sinus invasion or occlusion. No hydrocephalus or extra-axial collection. Vascular: Major vessels at the base of the brain show flow. Skull and upper cervical spine: Negative Sinuses/Orbits: Clear/normal Other: None IMPRESSION: 1. 3 subcentimeter foci of restricted diffusion in the right frontal lobe. Two of these are punctate and the larger focus in the medial inferior frontal  lobe measures up to 8 mm. There is no edema or enhancement associated with these foci and they are favored to represent recent small embolic strokes. Nonenhancing metastases are possible but less likely. The clustered nature favors vascular pathology. 2. No evidence of any other brain metastasis. 3. 1.8 cm meningioma at the frontoparietal vertex on the left. Minimal indentation of the surface of the brain but no significant mass effect. Broad surface along the superior sagittal sinus but without evidence of sinus invasion or occlusion. No apparent change compared to the CT scan August 2024. Electronically Signed   By: Oneil Officer M.D.   On: 09/20/2024 11:00    PERFORMANCE STATUS (ECOG) : 2 - Symptomatic, <50% confined to bed  Review of Systems  Constitutional:  Positive for fatigue.  Musculoskeletal:  Positive for back pain.  Unless otherwise noted,  a complete review of systems is negative.  Physical Exam General: NAD, uncomfortable in wheelchair  Cardiovascular: regular rate and rhythm Pulmonary: clear ant fields Abdomen: soft, nontender, + bowel sounds Extremities: no edema, no joint deformities Skin: no rashes Neurological: Alert and oriented x3  Discussed the use of AI scribe software for clinical note transcription with the patient, who gave verbal consent to proceed.  History of Present Illness Carolyn Wells is an 83 year old female with recently diagnosed metastatic lung cancer who presents to clinic for her initial palliative visit. She is accompanied by her daughter, Carolyn Wells and her Significant Other. Patient is alert and able to engage in discussions appropriately. She is in a wheelchair and appears uncomfortable reporting her back is hurting.   I introduced myself, Carolyn Wells, and Palliative's role in collaboration with the oncology team. Concept of Palliative Care was introduced as specialized medical care for people and their families living with serious illness.  It focuses on  providing relief from the symptoms and stress of a serious illness.  The goal is to improve quality of life for both the patient and the family. Values and goals of care important to patient and family were attempted to be elicited.  Carolyn Wells lives alone however daughter is only minutes apart. She is retired from [employer]	[place] and has 2 children. She has been with her significant other for a little over 1.5 years. She enjoys doing crossword puzzles.   At home she is able to perform most ADLs including cooking however with some limitations a times due to fatigue and back discomfort. Patient attributes increase in fatigue to chemotherapy treatments. She sleeps well at night and typically takes a nap during the day.   Her appetite remains good, and she supplements her diet with about two Boost nutritional drinks per day. She recently ate a pineapple ice cream cone, a pack of crackers, bacon, and eggs. No nausea or vomiting is present.  Daughter shares recent adjustments to amlodipine  with instructions to discontinue use in setting of hypotension. She shares previous discussions with PCP informed that adjustments in furosemide  most likely is impacting blood pressure in addition to current therapies resulting in need to discontinue use.   Carolyn Wells state she experiences typical back pain, which is manageable without medication. The pain eases when she lies down and worsens with prolonged sitting or movement. She has previously tried pain medications such as tramadol , gabapentin , and hydrocodone, but they caused undesirable side effects like drowsiness. She and family expresses no desire to attempt use of medications to eliminate unwanted side effects. States she will continue to manage discomfort best she can in current state.    Goals of Care We discussed Carolyn Wells's current illness and what it means in the larger context of her on-going co-morbidities. Natural disease trajectory and  expectations were discussed.  Patient and family are realistic in their understanding of her incurable cancer. She is clear in wishes to treat the treatable allowing her every opportunity to continue thriving for as long as she can. Her quality of life appears to be important to them.   I empathetically approached discussions regarding advanced directives, code status, and healthcare limitations. Carolyn Wells has a documented directive identifying her daughter as her medical decision maker. She confirms her desire is for a natural death with no life-sustaining measures including artificial feeding tubes. Daughter confirms DNR/DNI. They do not have an out of facility form. Form completed and given to daughter. I encouraged her  daughter to bring in copy of healthcare directives for filing. Education provided on MOST form. Patient and daughter will review and align wishes with her current documented directive. We discussed completion at follow-up visit as needed.   I discussed the importance of continued conversation with family and their medical providers regarding overall plan of care and treatment options, ensuring decisions are within the context of the patients values and GOCs. Assessment & Plan Established therapeutic relationship. Education provided on palliative's role in collaboration with their Oncology/Radiation team.  Malignant neoplasm under active treatment Experiencing fatigue post-chemotherapy. No nausea or vomiting. Appetite is good with intake of Boost and regular meals. No constipation or diarrhea. Typical back pain from arthritis, manageable without medication. Claritin used for anti-inflammatory response to treatment. - Use muscle rubs if back pain becomes uncomfortable.  Drug-induced hypotension Blood pressure low at 91/54 mmHg. Amlodipine  dose reduced due to low blood pressure. Furosemide  increase may contribute to hypotension. - Discontinued amlodipine  as instructed by PCP team.   - Monitor blood pressure and energy levels.  Advance care planning with DNR status Has advance directives including healthcare power of attorney, living will, and regular power of attorney. Living will specifies no artificial feedings and DNR status. Medical order for scope of treatment discussed but not completed today. Patient and family are realistic in their understanding and clear in wishes to treat the treatable while focusing on her quality of life.  - Bring advance directives to next appointment for scanning into chart. - Complete medical order for scope of treatment at home and return for signing and further discussions.  -Out of facility DNR form completed and given to daughter.  -No life-sustaining measures or long-term artificial feedings.   Follow-Up I will plan to see patient back in 2-3 weeks. Sooner if needed.   Patient expressed understanding and was in agreement with this plan. She also understands that She can call the clinic at any time with any questions, concerns, or complaints.   Thank you for your referral and allowing Palliative to assist in Carolyn Wells's care.   Number and complexity of problems addressed: HIGH - 1 or more chronic illnesses with SEVERE exacerbation, progression, or side effects of treatment - advanced cancer, pain. Any controlled substances utilized were prescribed in the context of palliative care.  Visit consisted of counseling and education dealing with the complex and emotionally intense issues of symptom management and palliative care in the setting of serious and potentially life-threatening illness.  Signed by: Levon Borer, AGPCNP-BC Palliative Medicine Team/[facility]

## 2024-10-24 ENCOUNTER — Encounter: Payer: Self-pay | Admitting: Internal Medicine

## 2024-10-24 ENCOUNTER — Other Ambulatory Visit: Payer: Self-pay | Admitting: *Deleted

## 2024-10-24 NOTE — Progress Notes (Unsigned)
 [facility] OFFICE PROGRESS NOTE  Carolyn Wells ORN, MD [address] [place] [address]  DIAGNOSIS: Stage IV (T2b, N3, M1 a) non-small cell lung cancer, adenocarcinoma presented with right upper lobe lung mass in addition to right hilar, mediastinal and right supraclavicular lymphadenopathy as well as malignant right pleural effusion diagnosed in October 2025   Molecular studies by foundation 1 showed positive KRAS G12C mutation and PD-L1 expression of 40%.   PRIOR THERAPY: None  CURRENT THERAPY: Systemic chemotherapy with carboplatin  for an AUC of 5, paclitaxel , and Keytruda  IV every 3 weeks.  First dose on 10/16/2024  INTERVAL HISTORY: Carolyn Wells 83 y.o. female returns to the clinic today for a follow-up visit accompanied by her husband and daughter.  The patient establish care in the clinic with Dr. Sherrod in 09/26/2024.  She was recently diagnosed with stage IV lung cancer.  She has renal insufficiency so Dr. Sherrod adjusted her chemotherapy.  She underwent her first cycle of treatment last week and tolerated it fair.   A tunneled pleural catheter was placed for malignant effusion but removed after two days due to severe pain. She currently denies shortness of breath and cough. She states she normally gets a cough when she has fluid accumulation. No evidence of bleeding, including epistaxis, gum bleeding, hemoptysis, or hematemesis. She is on Xarelto  for cardiac indications and has a port in place, recently flushed and accessed.  She reports persistent dizziness and feeling 'very loopy,' which has progressively worsened and now prevents ambulation. Her daughter corroborates these symptoms. She has chronic hypotension, with recent blood pressure readings around 90/60 mmHg, leading to discontinuation of amlodipine  last week and reduction of metoprolol  and furosemide  doses this morning per PCP's guidance. No improvement in dizziness since medication adjustments. No  new medications have been started; Tylenol  is used occasionally for pain. She denies fever, chills, dysuria, skin infection, or diarrhea.  She is not eating very much. She declined referral to nutritionist. She is drinking 1-2 bottles of water per day.   Chronic back pain due to scoliosis and spinal arthritis persists, exacerbated by ambulation. She has previously undergone nerve ablations and injections, with no further interventions planned. She is followed by a back specialist and palliative care for pain management. Low energy and pain limit bathing to every three days.  She is scheduled to see palliative care today.   She reports significant fatigue and malaise since her last treatment. After chemotherapy, she slept until lunchtime the following day. The white blood cell growth factor injection caused substantial discomfort despite seven days of Claritin; Tylenol  provided minimal relief. She is scheduled for four cycles of combination therapy, after which she will transition to immunotherapy alone.  Oral intake is poor, consisting of approximately two bottles of Boost daily, minimal water, and scant solid food. Her daughter feels her intake is inadequate. She maintains a weight of 133 lbs since her last hospitalization, down from a prior baseline of 140 lbs, with a nadir of 127 lbs during her hospital stay. No significant weight change recently.  She experiences mild, intermittent nausea, managed with a six-hour antiemetic as needed. One episode of vomiting occurred, attributed to brushing her tongue after eating. No ongoing vomiting. She denies rashes. A new headache localized to the right side of her head began this morning, which is atypical for her.  She uses leftover Xanax  at night for sleep, not prescribed by her oncologist. She is scheduled to see palliative care today for pain  and medication management.   MEDICAL HISTORY: Past Medical History:  Diagnosis Date   ABDOMINAL INCISIONAL  HERNIA 08/13/2009   s/p repair   Allergic rhinitis, cause unspecified 09/28/2011   BRADYCARDIA 09/26/2010   CKD (chronic kidney disease), stage III (HCC) 02/26/2015   Solitary kidney   COLONIC POLYPS, HX OF 08/25/2007   Complication of anesthesia    DIVERTICULOSIS, COLON 08/25/2007   Dysrhythmia    GASTROESOPHAGEAL REFLUX DISEASE 08/04/2007   HYPERLIPIDEMIA 09/27/2010   not on Rx therapy   HYPERTENSION 08/06/2007   Hypothyroidism    INTERNAL HEMORRHOIDS 08/04/2007   LOW BACK PAIN 08/06/2007   scoliosis; DJD; gets injections   Lung cancer (HCC)    OSTEOPENIA 08/04/2007   PAF (paroxysmal atrial fibrillation) (HCC)    a.  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    ALLERGIES:  is allergic to budesonide, elavil  [amitriptyline ], gabapentin , tramadol , hydrocodone, and ipratropium.  MEDICATIONS:  Current Outpatient Medications  Medication Sig Dispense Refill   acetaminophen  (TYLENOL ) 500 MG tablet Take 500 mg by mouth daily as needed for moderate pain (pain score 4-6) or mild pain (pain score 1-3). (Patient not taking: Reported on 10/12/2024)     ALPRAZolam  (XANAX ) 1 MG tablet Take 1 mg by mouth at bedtime.     amLODipine  (NORVASC ) 2.5 MG tablet Take 1 tablet (2.5 mg total) by mouth daily.     Ascorbic Acid (VITAMIN C) 1000 MG tablet Take 1,000 mg by mouth daily.     furosemide  (LASIX ) 20 MG tablet Take 2 tablets (40 mg total) by mouth daily. Take extra dose as needed for weight gain of 3 LB in 1 day, 5 LB in 1 week 60 tablet 0   levothyroxine  (SYNTHROID ) 50 MCG tablet TAKE 1 TABLET BY MOUTH ONCE DAILY BEFORE BREAKFAST APPOINTMENT  REQUIRED  FOR  FUTURE  REFILLS 90 tablet 0   LUMIGAN 0.01 % SOLN Place 1 drop into both eyes at bedtime.      metoprolol  succinate (TOPROL -XL) 25 MG 24 hr tablet Take 3 tablets  (75 mg total) by mouth daily. 90 tablet 0   Multiple Vitamins-Minerals (PRESERVISION AREDS 2 PO) Take 1 tablet by mouth daily.     Multiple Vitamins-Minerals (WOMENS 50+ MULTI VITAMIN/MIN PO) Take 1 tablet by mouth daily.     Omega-3 Fatty Acids (FISH OIL TRIPLE STRENGTH) 1400 MG CAPS Take 1,400 mg by mouth daily.     ondansetron  (ZOFRAN ) 8 MG tablet Take 1 tablet (8 mg total) by mouth every 8 (eight) hours as needed for nausea or vomiting. Start on the third day after carboplatin . 30 tablet 1   pantoprazole  (PROTONIX ) 40 MG tablet Take 1 tablet (40 mg total) by mouth daily. TAKE 1 TABLET BY MOUTH ONCE DAILY (Patient taking differently: Take 40 mg by mouth daily.) 90 tablet 3   Perfluorohexyloctane (MIEBO) 1.338 GM/ML SOLN Place 1.338 mLs into both eyes 4 (four) times daily.     potassium chloride  (KLOR-CON  M) 10 MEQ tablet Take 2 tablets (20 mEq total) by mouth every other day. 60 tablet 0   prochlorperazine  (COMPAZINE ) 10 MG tablet Take 1 tablet (10 mg total) by mouth every 6 (six) hours as needed for nausea or vomiting. 30 tablet 1   [MAR Hold] Rivaroxaban  (XARELTO ) 15 MG TABS tablet Take 1 tablet (  15 mg total) by mouth daily with supper.     vitamin k 100 MCG tablet Take 100 mcg by mouth daily.     No current facility-administered medications for this visit.    SURGICAL HISTORY:  Past Surgical History:  Procedure Laterality Date   CARDIOVERSION [name] 08/30/2024   Procedure: CARDIOVERSION;  Surgeon: Francyne Headland, MD;  Location: [facility];  Service: Cardiovascular;  Laterality: [name];   IR IMAGING GUIDED PORT INSERTION  10/09/2024   IR PERC PLEURAL DRAIN W/INDWELL CATH W/IMG GUIDE  10/04/2024   IR THORACENTESIS ASP PLEURAL SPACE W/IMG GUIDE  09/06/2024   IR THORACENTESIS ASP PLEURAL SPACE W/IMG GUIDE  09/14/2024   IR THORACENTESIS ASP PLEURAL SPACE W/IMG GUIDE  09/28/2024   LAMINECTOMY  1999   L4-5/lumbar   NEPHRECTOMY     Right   s/o incisional hernia repair     RUQ    STRABISMUS SURGERY Bilateral 04/14/2019   Procedure: STRABISMUS REPAIR BILATERAL;  Surgeon: Neysa Fallow, MD;  Location: [facility];  Service: Ophthalmology;  Laterality: Bilateral;   TONSILLECTOMY      REVIEW OF SYSTEMS:   Review of Systems  Constitutional: Positive for fatigue, decreased appetite, and dizziness. Negative for chills, fever and unexpected weight change.  HENT: Negative for mouth sores, nosebleeds, sore throat and trouble swallowing.   Eyes: Negative for eye problems and icterus.  Respiratory: Negative for cough, hemoptysis, shortness of breath and wheezing.   Cardiovascular: Negative for chest pain and leg swelling.  Gastrointestinal: Negative for abdominal pain, constipation, diarrhea, nausea and vomiting.  Genitourinary: Negative for bladder incontinence, difficulty urinating, dysuria, frequency and hematuria.   Musculoskeletal: Positive for chronic back pain. Negative for gait problem, neck pain and neck stiffness.  Skin: Negative for itching and rash.  Neurological: Positive for headache and lightheadedness. Negative for dizziness, extremity weakness, gait problem, headaches, and seizures.  Hematological: Negative for adenopathy. Does not bruise/bleed easily.  Psychiatric/Behavioral: Negative for confusion, depression and sleep disturbance. The patient is not nervous/anxious.     PHYSICAL EXAMINATION:  There were no vitals taken for this visit.  ECOG PERFORMANCE STATUS: 2  Physical Exam  Constitutional: Oriented to person, place, and time and well-developed, well-nourished, and in no distress.  HENT:  Head: Normocephalic and atraumatic.  Mouth/Throat: Oropharynx is clear and moist. No oropharyngeal exudate.  Eyes: Conjunctivae are normal. Right eye exhibits no discharge. Left eye exhibits no discharge. No scleral icterus.  Neck: Normal range of motion. Neck supple.  Cardiovascular: Normal rate, regular rhythm, normal heart sounds and intact distal  pulses.   Pulmonary/Chest: Effort normal and breath sounds normal. No respiratory distress. No wheezes. No rales.  Abdominal: Soft. Bowel sounds are normal. Exhibits no distension and no mass. There is no tenderness.  Musculoskeletal: Normal range of motion. Exhibits no edema.  Lymphadenopathy:    No cervical adenopathy.  Neurological: Alert and oriented to person, place, and time. Exhibits muscle wasting. Examined in the wheelchair.  Skin: Skin is warm and dry. No rash noted. Not diaphoretic. No erythema. No pallor.  Psychiatric: Mood, memory and judgment normal.  Vitals reviewed.  LABORATORY DATA: Lab Results  Component Value Date   WBC 8.4 10/16/2024   HGB 9.1 (L) 10/16/2024   HCT 28.2 (L) 10/16/2024   MCV 87.9 10/16/2024   PLT 367 10/16/2024      Chemistry      Component Value Date/Time   NA 139 10/16/2024 0741   NA 141 02/02/2023 1406   K 4.3  10/16/2024 0741   CL 98 10/16/2024 0741   CO2 28 10/16/2024 0741   BUN 44 (H) 10/16/2024 0741   BUN 28 (H) 02/02/2023 1406   CREATININE 2.06 (H) 10/16/2024 0741   CREATININE 1.36 (H) 02/07/2016 0942      Component Value Date/Time   CALCIUM  9.7 10/16/2024 0741   ALKPHOS 114 10/16/2024 0741   AST 28 10/16/2024 0741   ALT 11 10/16/2024 0741   BILITOT 0.3 10/16/2024 0741       RADIOGRAPHIC STUDIES:  IR US  CHEST Result Date: 10/12/2024 INDICATION: 83 year old with lung cancer and recurrent right pleural effusion. Recently underwent pleur-x drain placement 10/04/24 with 1.7 L removed at placement; however, the drain was removed per patient request for discomfort related to drain. Therapeutic thoracentesis requested today for increased SOB noted by outpatient provider. EXAM: CHEST ULTRASOUND COMPARISON:  None Available. FINDINGS: Minimal right-sided pleural effusion. No significant pocket of fluid or percutaneous window to allow safe thoracentesis. Risks outweigh the benefits. IMPRESSION: Small right-sided pleural effusion with no  safe window for percutaneous access Performed by Laymon Coast, NP Electronically Signed   By: Ester Sides M.D.   On: 10/12/2024 15:08   IR PERC PLEURAL DRAIN W/INDWELL CATH W/IMG GUIDE Addendum Date: 10/10/2024 ADDENDUM REPORT: 10/10/2024 10:31 ADDENDUM: Correction to the moderate sedation time. The sedation time was 25 minutes. Electronically Signed   By: Juliene Balder M.D.   On: 10/10/2024 10:31   Result Date: 10/10/2024 INDICATION: 83 year old with lung cancer and recurrent right pleural effusion. EXAM: PLACEMENT OF TUNNELED PLEURAL DRAINAGE CATHETER WITH ULTRASOUND AND FLUOROSCOPIC GUIDANCE MEDICATIONS: Ancef  2 g ANESTHESIA/SEDATION: Moderate (conscious) sedation was employed during this procedure. A total of Versed  1.5 mg and Fentanyl  50 mcg was administered intravenously by the radiology nurse. Total intra-service moderate Sedation Time: 15 minutes. The patient's level of consciousness and vital signs were monitored continuously by radiology nursing throughout the procedure under my direct supervision. COMPLICATIONS: None immediate. FLUOROSCOPY: Radiation Exposure Index (as provided by the fluoroscopic device): 1 mGy Kerma PROCEDURE: Informed written consent was obtained from the patient after a thorough discussion of the procedural risks, benefits and alternatives. All questions were addressed. Maximal Sterile Barrier Technique was utilized including caps, mask, sterile gowns, sterile gloves, sterile drape, hand hygiene and skin antiseptic. A timeout was performed prior to the initiation of the procedure. Ultrasound demonstrated an adequate amount of right pleural fluid. The right side of the chest was prepped and draped in sterile fashion. Skin was anesthetized with 1% lidocaine  into two spots. Two small incisions were made. Using ultrasound guidance, a thoracentesis catheter was directed into the pleural fluid along the more lateral incision. Fluid was aspirated from the catheter. PleurX catheter  was tunneled between the 2 incisions. The cuff was placed underneath the skin. Wire was placed through the thoracentesis catheter and wire was advanced into the pleural space under fluoroscopy. Catheter was removed over the wire and the tract was dilated to accommodate a peel-away sheath. PleurX was placed through the peel-away sheath and advanced into the pleural space. Pleural fluid was removed from the PleurX catheter. The thoracentesis skin access site was closed using absorbable suture and Dermabond. Catheter was secured to skin with suture. Dressing was placed. Fluoroscopic and ultrasound images were taken and saved for documentation. FINDINGS: 1.7 L of yellow pleural fluid was removed. IMPRESSION: Successful placement of a tunneled right pleural drainage catheter using ultrasound and fluoroscopic guidance. Electronically Signed: By: Juliene Balder M.D. On: 10/04/2024 19:49   IR IMAGING  GUIDED PORT INSERTION Result Date: 10/09/2024 INDICATION: 83 year old female with history of advanced stage lung cancer requiring central venous access for chemotherapy administration. EXAM: IMPLANTED PORT A CATH PLACEMENT WITH ULTRASOUND AND FLUOROSCOPIC GUIDANCE COMPARISON:  None Available. MEDICATIONS: None. ANESTHESIA/SEDATION: Moderate (conscious) sedation was employed during this procedure. A total of Versed  1.5 mg and Fentanyl  75 mcg was administered intravenously. Moderate Sedation Time: 10 minutes. The patient's level of consciousness and vital signs were monitored continuously by radiology nursing throughout the procedure under my direct supervision. CONTRAST:  None FLUOROSCOPY TIME:  One mGy reference air kerma COMPLICATIONS: None immediate. PROCEDURE: The procedure, risks, benefits, and alternatives were explained to the patient. Questions regarding the procedure were encouraged and answered. The patient understands and consents to the procedure. The right neck and chest were prepped with chlorhexidine in a sterile  fashion, and a sterile drape was applied covering the operative field. Maximum barrier sterile technique with sterile gowns and gloves were used for the procedure. A timeout was performed prior to the initiation of the procedure. Ultrasound was used to examine the jugular vein which was compressible and free of internal echoes. A skin marker was used to demarcate the planned venotomy and port pocket incision sites. Local anesthesia was provided to these sites and the subcutaneous tunnel track with 1% lidocaine  with 1:100,000 epinephrine . A small incision was created at the jugular access site and blunt dissection was performed of the subcutaneous tissues. Under ultrasound guidance, the jugular vein was accessed with a 21 ga micropuncture needle and an 0.018 wire was inserted to the superior vena cava. Real-time ultrasound guidance was utilized for vascular access including the acquisition of a permanent ultrasound image documenting patency of the accessed vessel. A 5 Fr micopuncture set was then used, through which a 0.035 Rosen wire was passed under fluoroscopic guidance into the inferior vena cava. An 8 Fr dilator was then placed over the wire. A subcutaneous port pocket was then created along the upper chest wall utilizing a combination of sharp and blunt dissection. The pocket was irrigated with sterile saline, packed with gauze, and observed for hemorrhage. A single lumen clear view power injectable port was chosen for placement. The 8 Fr catheter was tunneled from the port pocket site to the venotomy incision. The port was placed in the pocket. The external catheter was trimmed to appropriate length. The dilator was exchanged for an 8 Fr peel-away sheath under fluoroscopic guidance. The catheter was then placed through the sheath and the sheath was removed. Final catheter positioning was confirmed and documented with a fluoroscopic spot radiograph. The port was accessed with a Huber needle, aspirated, and  flushed with heparinized saline. The deep dermal layer of the port pocket incision was closed with interrupted 3-0 Vicryl suture. Dermabond was then placed over the port pocket and neck incisions. The patient tolerated the procedure well without immediate post procedural complication. FINDINGS: After catheter placement, the tip lies within the superior cavoatrial junction. The catheter aspirates and flushes normally and is ready for immediate use. IMPRESSION: Successful placement of a power injectable Port-A-Cath via the right internal jugular vein. The catheter is ready for immediate use. Ester Sides, MD [facility] Electronically Signed   By: Ester Sides M.D.   On: 10/09/2024 13:03   DG Chest Port 1 View Result Date: 10/07/2024 CLINICAL DATA:  Pleural effusion.  Lung carcinoma. EXAM: PORTABLE CHEST 1 VIEW COMPARISON:  10/06/2024 FINDINGS: Heart size remains within normal limits. Atelectasis or infiltrate  in left lung base and tiny left pleural effusion show no significant change. No pneumothorax visualized. Pulmonary hyperinflation again seen, consistent with COPD. 4.6 cm mass in the medial right upper lobe is unchanged. IMPRESSION: No significant change in left basilar atelectasis or infiltrate, and tiny left pleural effusion. Stable right upper lobe mass. COPD. Electronically Signed   By: Carolyn DELENA Kil M.D.   On: 10/07/2024 07:24   DG Chest Port 1 View Result Date: 10/06/2024 CLINICAL DATA:  142230 Pleural effusion [id number] EXAM: PORTABLE CHEST - 1 VIEW COMPARISON:  October 06, 2026 FINDINGS: Redemonstrated medial right apical mass measuring 4.3 cm, unchanged. Trace right pleural effusion. Interval removal of the right-sided thoracostomy tube. No pneumothorax. No new airspace consolidation. Trace left pleural effusion. No cardiomegaly. Tortuous aorta. No acute fracture or destructive lesions. Multilevel thoracic osteophytosis. Osteopenia.  IMPRESSION: 1. Interval removal of the right-sided thoracostomy tube. No pneumothorax. 2. Similar trace bilateral pleural effusions. Electronically Signed   By: Rogelia Myers M.D.   On: 10/06/2024 18:00   DG Chest Port 1 View Result Date: 10/06/2024 CLINICAL DATA:  Pleural effusion. History of lung cancer and recent placement of a PleurX catheter. EXAM: PORTABLE CHEST 1 VIEW COMPARISON:  10/04/2024 and chest CT 10/05/2024 FINDINGS: Right chest PleurX catheter is stable with the tip near the right lung apex. Right pneumothorax is not confidently identified. Again noted is the mass in the medial right upper lung. Mild haziness at the right lung base may represent some re-accumulation of pleural fluid. Persistent densities at left lung base compatible with small left pleural effusion. Heart size is stable. IMPRESSION: 1. Stable position of the right chest PleurX catheter. Right pneumothorax is not confidently identified. 2. Mild haziness at the right lung base may represent some re-accumulation of pleural fluid. 3. Stable small left pleural effusion. Electronically Signed   By: Juliene Balder M.D.   On: 10/06/2024 08:30   CT CHEST WO CONTRAST Result Date: 10/05/2024 CLINICAL DATA:  History of lung cancer. Chest pain. * Tracking Code: BO * EXAM: CT CHEST WITHOUT CONTRAST TECHNIQUE: Multidetector CT imaging of the chest was performed following the standard protocol without IV contrast. RADIATION DOSE REDUCTION: This exam was performed according to the departmental dose-optimization program which includes automated exposure control, adjustment of the mA and/or kV according to patient size and/or use of iterative reconstruction technique. COMPARISON:  Chest radiograph dated 10/04/2024, CT chest dated 09/05/2024, nuclear medicine PET dated 09/19/2024 FINDINGS: Cardiovascular: Multichamber cardiomegaly. No significant pericardial fluid/thickening. Unchanged dilated ascending thoracic aorta measures 4.0 x 4.0 cm.  Dilated main pulmonary artery measures 3.7 cm. Coronary artery calcifications and aortic atherosclerosis. Mediastinum/Nodes: Imaged thyroid  gland without nodules meeting criteria for imaging follow-up by size. Small hiatal hernia. Unchanged multi station lymphadenopathy, including 14 mm right subpectoral (3:31, 14 mm high left paratracheal (3:22), 19 mm precarinal (3:65), and 11 mm right hilar (3:75, remeasured). Lungs/Pleura: The central airways are patent. Mild centrilobular and paraseptal emphysema. 4.8 x 3.5 cm medial right apical mass (4:37), not substantially changed. Unchanged inferior right upper lobe ground-glass nodules measuring up to 7 mm (4:71) and solid right lower lobe nodules measuring up to 5 mm (4:104, 107). Triangular 4 x 3 mm right apical nodule (4:30) is new compared to 09/05/2024. Unchanged small foci of tree-in-bud nodules within the medial left upper lobe (4:66). Subsegmental right middle lobe and lingular atelectasis, unchanged. Increased left lower lobe relaxation atelectasis. Interval resolution of previously noted anterior left lower lobe consolidation. Scattered calcified pulmonary nodules and subsegmental mucous  plugging. Unchanged trace right apical pneumothorax status post placement of right inferolateral approach pleural catheter with tip terminating at the level of the lateral apex. Decreased trace right pleural effusion. Slightly increased small left pleural effusion. Upper abdomen: Normal. Musculoskeletal: No acute or abnormal lytic or blastic osseous lesions. Multilevel degenerative changes of the thoracic spine. Trace right lateral chest wall subcutaneous emphysema at the site of catheter insertion. IMPRESSION: 1. Unchanged trace right apical pneumothorax status post placement of right inferolateral approach pleural catheter with tip terminating at the level of the lateral apex. Decreased trace right pleural effusion. 2. Slightly increased small left pleural effusion with  increased left lower lobe relaxation atelectasis. 3. Unchanged 4.8 cm medial right apical mass and multistation lymphadenopathy. 4. New triangular 4 x 3 mm right apical nodule, which may be infectious/inflammatory. 5. Unchanged dilated ascending thoracic aorta measures 4.0 x 4.0 cm. Recommend annual imaging followup by CTA or MRA. This recommendation follows 2010 ACCF/AHA/AATS/ACR/ASA/SCA/SCAI/SIR/STS/SVM Guidelines for the Diagnosis and Management of Patients with Thoracic Aortic Disease. Circulation. 2010; 121: [id number]. Aortic aneurysm NOS (ICD10-I71.9) 6. Dilated main pulmonary artery measures 3.7 cm, which can be seen in the setting of pulmonary arterial hypertension. Aortic Atherosclerosis (ICD10-I70.0) and Emphysema (ICD10-J43.9). Coronary artery calcifications. Assessment for potential risk factor modification, dietary therapy or pharmacologic therapy may be warranted, if clinically indicated. Electronically Signed   By: Limin  Xu M.D.   On: 10/05/2024 13:18   DG Chest Port 1 View Result Date: 10/04/2024 CLINICAL DATA:  Lung cancer and recent placement of right chest PleurX catheter. EXAM: PORTABLE CHEST 1 VIEW COMPARISON:  Chest radiograph 10/02/2024 FINDINGS: PleurX catheter in the right chest with the tip near the apex. Evidence for small right apical pneumothorax. Right pleural effusion has been removed. Markedly improved aeration in the right lung. Patient has a mass along the medial aspect of the right upper lung measuring up to 4.2 cm. Persistent densities at the left lung base compatible with left pleural fluid. Heart size is stable and within normal limits. IMPRESSION: 1. Placement of a right chest PleurX catheter. Right pleural effusion has essentially resolved. Evidence for a small right apical pneumothorax. 2. Small left pleural effusion. 3. Right lung mass. Electronically Signed   By: Juliene Balder M.D.   On: 10/04/2024 19:55   DG Chest Portable 1 View Result Date: 10/02/2024 CLINICAL  DATA:  Shortness of breath. EXAM: PORTABLE CHEST 1 VIEW COMPARISON:  Chest radiograph dated 09/28/2024. FINDINGS: Bilateral pleural effusions, right greater than left, and increased since the prior radiograph. There is bibasilar atelectasis or pneumonia. No pneumothorax. Stable cardiac silhouette. No acute osseous pathology. IMPRESSION: Bilateral pleural effusions, right greater than left, and increased since the prior radiograph. Electronically Signed   By: Vanetta Chou M.D.   On: 10/02/2024 13:14   IR THORACENTESIS ASP PLEURAL SPACE W/IMG GUIDE Result Date: 09/28/2024 INDICATION: History of right lung cancer with recurrent right pleural effusion. Request for therapeutic right thoracentesis. EXAM: ULTRASOUND GUIDED THERAPEUTIC RIGHT THORACENTESIS MEDICATIONS: 7 mL 1% lidocaine  COMPLICATIONS: None immediate. PROCEDURE: An ultrasound guided thoracentesis was thoroughly discussed with the patient and questions answered. The benefits, risks, alternatives and complications were also discussed. The patient understands and wishes to proceed with the procedure. Written consent was obtained. Ultrasound was performed to localize and mark an adequate pocket of fluid in the right chest. The area was then prepped and draped in the normal sterile fashion. 1% Lidocaine  was used for local anesthesia. Under ultrasound guidance a 6 Fr Safe-T-Centesis catheter was introduced.  Thoracentesis was performed. The catheter was removed and a dressing applied. FINDINGS: A total of approximately 750 mL of dark yellow fluid was removed. IMPRESSION: Successful ultrasound guided right thoracentesis yielding 750 mL of pleural fluid. Performed and dictated by Kimble Clas, PA-C Electronically Signed   By: Marcey Moan M.D.   On: 09/28/2024 14:21   DG Chest 1 View Result Date: 09/28/2024 CLINICAL DATA:  Follow-up pleural effusion. Known right upper lobe lung cancer. EXAM: CHEST  1 VIEW COMPARISON:  Chest x-ray 09/25/2024, PET-CT  09/19/2024 FINDINGS: Lungs are somewhat hypoinflated demonstrate evidence of small bilateral pleural effusions likely with associated bibasilar atelectasis without significant change. Stable known right lung cancer over the right upper lobe/right paramediastinal region. Cardiac silhouette is normal. Remainder of the exam is unchanged. IMPRESSION: 1. Stable small bilateral pleural effusions with associated bibasilar atelectasis. 2. Stable known right upper lobe lung cancer. Electronically Signed   By: Toribio Agreste M.D.   On: 09/28/2024 14:06   DG Chest 2 View Result Date: 09/25/2024 EXAM: 2 VIEW(S) XRAY OF THE CHEST 09/25/2024 12:37:00 PM COMPARISON: Paracin colon 09/20/2024. CLINICAL HISTORY: chest pain and shortness of breath with Hx of pleural effusions and lung cancer FINDINGS: LUNGS AND PLEURA: Stable bilateral pleural effusions. Bibasilar airspace opacities primarily representing atelectasis; a component of pneumonia cannot be excluded. Right upper lobe paramediastinal mass noted unchanged. Low lung volumes are present, causing crowding of the pulmonary vasculature. No pneumothorax. HEART AND MEDIASTINUM: No acute abnormality of the cardiac and mediastinal silhouettes. BONES AND SOFT TISSUES: No acute osseous abnormality. IMPRESSION: 1. Stable bilateral pleural effusions and bibasilar airspace opacities, primarily representing atelectasis. Pneumonia cannot be excluded. 2. Unchanged right upper lobe paramediastinal mass. 3. Low lung volumes causing crowding of the pulmonary vasculature. Electronically signed by: Ryan Salvage MD 09/25/2024 01:42 PM EST RP Workstation: [id number]     ASSESSMENT/PLAN:  This is a very pleasant 83 year old Caucasian female with stage IV (T2b, N3, and 1A) non-small cell lung cancer, adenocarcinoma.  She presented with a right upper lobe lung mass in addition to right hilar, mediastinal, right supraclavicular lymphadenopathy as well as a malignant right pleural effusion.   She was diagnosed in October 2025.  His molecular studies show positive KRAS G 12C mutation and PD-L1 expression 40%.  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  The patient is currently undergoing palliative systemic chemotherapy and immunotherapy with carboplatin  for an AUC of 5, paclitaxel , and Keytruda  with Neulasta  support IV every 3 weeks.  First dose on 10/16/2024.  She status post 1 cycle.  - Continue chemotherapy and immunotherapy for four cycles, then transition to immunotherapy alone. - Coordinate with palliative care for symptom management, including pain and anxiety. - Reassured treatment will become less intensive after four cycles, with expected reduction in side effects.  Anemia due to antineoplastic chemotherapy Symptomatic anemia with hemoglobin at 8.3 g/dL, likely multifactorial from malignancy and chemotherapy. No active bleeding. Close monitoring required due to symptoms and ongoing therapy. - Monitor hemoglobin and complete blood count weekly. - we will arrange for 1 unit of blood today.  -I will add standing orders for cbc and sample to blood bank  - Encouraged nutritional support and hydration; offered nutritionist referral, declined.  -We will arrange for 1/2 L IVF today due to poor p.o. intake  Pleural effusion Previously managed with tunneled catheter, removed due to pain. Effusion monitored clinically. - Monitor for symptoms of recurrent or worsening pleural effusion, including  dyspnea, cough, or chest heaviness. - Consider chest radiograph if symptoms recur or worsen. Her oxygen is 100% and she is not having symptoms.    We will see her back for follow-up visit in 2 weeks before undergoing the next cycle of treatment.   The patient was advised to call immediately if she has any concerning symptoms in the interval. The patient voices understanding of current disease status and treatment options and is  in agreement with the current care plan. All questions were answered. The patient knows to call the clinic with any problems, questions or concerns. We can certainly see the patient much sooner if necessary   No orders of the defined types were placed in this encounter.   The total time spent in the appointment was 30-39 minute  Daritza Brees L Kasumi Ditullio, PA-C 10/24/2024

## 2024-10-24 NOTE — Transitions of Care (Post Inpatient/ED Visit) (Signed)
 Transition of Care week 3/ day # 14  Visit Note  10/24/2024  Name: Carolyn Wells MRN: [id number]          DOB: 1941/04/05  Situation: Patient enrolled in [facility] 30-day program. Visit completed with daughter- caregiver [name] by telephone.   HIPAA identifiers x 2 verified  Background:  Recent hospitalization November 24-29, 2025 for CHF: recurrent (R) pleural effusion in setting of stage IV lung CA Resides alone, independent in most self-care activities; daughter- caregiver manages health care needs- lives 6 miles from patient and visits patient daily- attends all appointments with patient and provides all transportation; no current assistive devices (1) unplanned hospitalization x last (6)/ (12) months  Initial Transition Care Management Follow-up Telephone Call Discharge Date and Diagnosis: 10/07/24, Recurrent R pleural effusion / CHF in setting of stage IV lung ca   Past Medical History:  Diagnosis Date   ABDOMINAL INCISIONAL HERNIA 08/13/2009   s/p repair   Allergic rhinitis, cause unspecified 09/28/2011   BRADYCARDIA 09/26/2010   CKD (chronic kidney disease), stage III (HCC) 02/26/2015   Solitary kidney   COLONIC POLYPS, HX OF 08/25/2007   Complication of anesthesia    DIVERTICULOSIS, COLON 08/25/2007   Dysrhythmia    GASTROESOPHAGEAL REFLUX DISEASE 08/04/2007   HYPERLIPIDEMIA 09/27/2010   not on Rx therapy   HYPERTENSION 08/06/2007   Hypothyroidism    INTERNAL HEMORRHOIDS 08/04/2007   LOW BACK PAIN 08/06/2007   scoliosis; DJD; gets injections   Lung cancer (HCC)    OSTEOPENIA 08/04/2007   PAF (paroxysmal atrial fibrillation) (HCC)    a.  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   Assessment:  with caregiver- daughter:  Doing overall okay but her dizziness seems much worse; her blood  pressures are also running on the low side.  I have been holding the amlodipine  and trying to make sure she is staying hydrated well, and eating, which has been a problem from day one.  No falls, she still will not use her cane or walker so her boyfriend is now staying with her overnight to make sure someone is here with her to help if she needs it.  I will do as you have advised and reach out to the cardiologist now- just sent MyChart message to him.  We are going tomorrow for her to have updated labs at the cancer center.  I will explain all of this to them as well.  Her urine is nice and clear yellow, so that looks okay, and her breathing is fine with high oxygen levels.  It's just this dizziness that I am concerned about along with the low blood pressures.  Hopefully the lab work will provide some insight tomorrow    Daughter denies additional clinical concerns and reports patient is in no distress: just dizzy with low blood pressures; her color is fine and she otherwise seems completely normal  throughout TOC 30-day program outreach call today   Patient Reported Symptoms: Cognitive Cognitive Status: Alert and oriented to person, place, and time, Difficulties with attention and concentration, Normal speech and language skills, Requires Assistance Decision Making (per daughter- caregiver report) Cognitive/Intellectual Conditions Management [RPT]: None reported or documented in medical history or problem list      Neurological Neurological Review of Symptoms: Dizziness, Other: (per daughter- caregiver report)  Oher Neurological Symptoms/Conditions [RPT]: Caregiver tells me for the first time today that patient has experienced chronic dizziness forever; tells me that this has become worse with increased lasix ; and that she is currently holding amlodipine  due to ongoing low BP's at home; reports patient not eating or drinking very well; although she looks good, she looks fine, but she is just  very dizzy, more so than usual Neurological Management Strategies: Routine screening, Adequate rest, Coping strategies  HEENT HEENT Symptoms Reported: No symptoms reported (per daughter- caregiver report)      Cardiovascular Cardiovascular Symptoms Reported: Dizziness Other Cardiovascular Symptoms: Caregiver tells me for the first time today that patient has experienced chronic dizziness forever; tells me that this has become worse with increased lasix ; and that she is currently holding amlodipine  due to ongoing low BP's at home; reports patient not eating or drinking very well; although she looks good, she looks fine, but she is just very dizzy, more so than usual Advised to contact cardiology provider: daughter did so via mychart message during [facility] call Does patient have uncontrolled Hypertension?: No Cardiovascular Management Strategies: Routine screening, Medication therapy, Diet modification, Coping strategies, Adequate rest, Fluid modification, Weight management Do You Have a Working Readable Scale?: Yes Weight: 130 lb (59 kg) (home reported weight from 10/24/24)  Respiratory Respiratory Symptoms Reported: No symptoms reported (per daughter- caregiver report) Other Respiratory Symptoms: Her breathing is fine; no shortness of breath and her oxygen levels are staying in the high 90's on room air, she is not having any distress around her breathing at all Respiratory Management Strategies: Routine screening, Weight management, Medication therapy, Coping strategies, Asthma action plan, Adequate rest, Fluid modification  Endocrine Endocrine Symptoms Reported: No symptoms reported (per daughter- caregiver report) Is patient diabetic?: No    Gastrointestinal Gastrointestinal Symptoms Reported: No symptoms reported (per daughter- caregiver report) Additional Gastrointestinal Details: She is not eating much- but that's how it's been for a long time; I am doing what I can to get food in  her;  Reports last BM was normal, yesterday Gastrointestinal Management Strategies: Coping strategies    Genitourinary Genitourinary Symptoms Reported: No symptoms reported (per daughter- caregiver report) Additional Genitourinary Details: Her pee is light yellow and clear, she is peeing in normal amounts- it is not dark or cloudy Genitourinary Management Strategies: Coping strategies  Integumentary Integumentary Symptoms Reported: No symptoms reported (per daughter- caregiver report) Additional Integumentary Details: Her port looks fine: no redness, no swelling, it is still open to air; going tomorrow to have the pport flushed and assessed Skin Management Strategies: Routine screening, Coping strategies  Musculoskeletal Musculoskelatal Symptoms Reviewed: Other (per daughter- caregiver report) Other Musculoskeletal Symptoms: worsening dizziness- as per neuro/ CVS assessment; confirmed still not currently requiring/ using assistive devices for ambulation: I can't convince her to use a cane or walker; so far no falls Musculoskeletal Management Strategies: Routine screening, Coping strategies   Fall risk Follow up: Falls prevention discussed, Education provided  Psychosocial           Today's Vitals   10/24/24 1300  BP: 90/60  SpO2: 99%  Weight: 130 lb (59 kg)      Medications Reviewed Today     Reviewed by Wolfgang Finigan M, RN (Registered Nurse) on 10/24/24 at 1326  Med List Status: <None>   Medication Order Taking? Sig Documenting Provider Last Dose Status Informant  acetaminophen  (TYLENOL ) 500 MG tablet [id number]  Take 500 mg by mouth daily as needed for moderate pain (pain score 4-6) or  mild pain (pain score 1-3).  Patient not taking: Reported on 10/12/2024   [provider]  Active Self, Pharmacy Records  ALPRAZolam  (XANAX ) 1 MG tablet [id number]  Take 1 mg by mouth at bedtime. [provider]  Active Self, Pharmacy Records           Med Note (CRUTHIS,  CHLOE C   Mon Oct 02, 2024  2:36 PM) No fill hx found for this medication on PMP. Pt and daughter are adamant the pt is taking this medication every night at bedtime.   amLODipine  (NORVASC ) 2.5 MG tablet [id number]  Take 1 tablet (2.5 mg total) by mouth daily.  Patient not taking: Reported on 10/24/2024   Segal, Jared E, DO  Active   Ascorbic Acid (VITAMIN C) 1000 MG tablet [id number]  Take 1,000 mg by mouth daily. [provider]  Active Self, Pharmacy Records  furosemide  (LASIX ) 20 MG tablet [id number] Yes Take 2 tablets (40 mg total) by mouth daily. Take extra dose as needed for weight gain of 3 LB in 1 day, 5 LB in 1 week Fairy Frames, MD  Active   levothyroxine  (SYNTHROID ) 50 MCG tablet [id number]  TAKE 1 TABLET BY MOUTH ONCE DAILY BEFORE BREAKFAST APPOINTMENT  REQUIRED  FOR  FUTURE  REFILLS [name], MD  Active Self, Pharmacy Records  LUMIGAN 0.01 % SOLN [id number]  Place 1 drop into both eyes at bedtime.  [provider]  Active Self, Pharmacy Records           Med Note (COX, HEATHER C   Wed Apr 14, 2017  9:08 AM)    metoprolol  succinate (TOPROL -XL) 25 MG 24 hr tablet [id number] Yes Take 3 tablets (75 mg total) by mouth daily. Fairy Frames, MD  Active   Multiple Vitamins-Minerals (PRESERVISION AREDS 2 PO) [id number]  Take 1 tablet by mouth daily. [provider]  Active Self, Pharmacy Records  Multiple Vitamins-Minerals (WOMENS 50+ MULTI VITAMIN/MIN PO) [id number]  Take 1 tablet by mouth daily. [provider]  Active Self, Pharmacy Records  Omega-3 Fatty Acids (FISH OIL TRIPLE STRENGTH) 1400 MG CAPS [id number]  Take 1,400 mg by mouth daily. [provider]  Active Self, Pharmacy Records  ondansetron  (ZOFRAN ) 8 MG tablet [id number]  Take 1 tablet (8 mg total) by mouth every 8 (eight) hours as needed for nausea or vomiting. Start on the third day after carboplatin . Sherrod Sherrod, MD  Active Self, Pharmacy Records  pantoprazole  (PROTONIX ) 40 MG  tablet [id number]  Take 1 tablet (40 mg total) by mouth daily. TAKE 1 TABLET BY MOUTH ONCE DAILY  Patient taking differently: Take 40 mg by mouth daily.   [name], MD  Active Self, Pharmacy Records  Perfluorohexyloctane [facility]) 1.338 GM/ML SOLN [id number]  Place 1.338 mLs into both eyes 4 (four) times daily. [provider]  Active Self, Pharmacy Records  potassium chloride  (KLOR-CON  M) 10 MEQ tablet [id number]  Take 2 tablets (20 mEq total) by mouth every other day. Fairy Frames, MD  Active   prochlorperazine  (COMPAZINE ) 10 MG tablet [id number]  Take 1 tablet (10 mg total) by mouth every 6 (six) hours as needed for nausea or vomiting. Sherrod Sherrod, MD  Active Self, Pharmacy Records  Rivaroxaban  (XARELTO ) 15 MG TABS tablet [id number]  Take 1 tablet (15 mg total) by mouth daily with supper. Fairy Frames, MD  Active   vitamin k 100 MCG tablet [id number]  Take 100 mcg by mouth daily. [provider]  Active Self, Pharmacy  Records           Recommendation:   Specialty provider follow-up: oncology provider 10/25/24; encouraged daughter to consider scheduling prompt cardiology provider office visit to address her stated concerns today: daughter confirms has sent cardiology provider MyChart message, will await follow up and scheduled as per cardiology provider recommendation: verified caregiver's mychart message was sent to cardiology provider Continue Current Plan of Care  Follow Up Plan:   Telephone follow-up in 1 week- as scheduled 10/31/24: confirmed caregiver has my direct contact information should she need/ desire additional support prior to next scheduled outreach  Pls call/ message for questions,  Shadavia Dampier Mckinney Savina Olshefski, RN, BSN, CCRN Alumnus RN Care Manager  Transitions of Care  [facility] - [facility] [phone]: direct office

## 2024-10-24 NOTE — Patient Instructions (Signed)
 Visit Information  Thank you for taking time to visit with me today. Please don't hesitate to contact me if I can be of assistance to you before our next scheduled telephone appointment.  Our next appointment is by telephone on Tuesday, October 31, 2024 at 2:00 pm  Please call the care guide team at [phone] if you need to cancel or reschedule your appointment.   Following are the goals we discussed today:  Patient Self Care Activities:  Attend all scheduled provider appointments Call pharmacy for medication refills 3-7 days in advance of running out of medications Call provider office for new concerns or questions  Participate in Transition of Care Program/Attend TOC scheduled calls Take medications as prescribed   Continue pacing activity to avoid episodes of shortness of breath Continue to follow your established action plan for episodes of shortness of breath Weigh yourself every day to stay on top of early fluid retention: write down your weights every day so you remember what it is from day to day: follow the weight-gain guidelines and action plan to call your doctor if you gain more than 3 lbs overnight, or 5 lbs in one week Use assistive devices (cane/ walker) as needed to prevent falls, especially if you are feeling dizzy If you believe your condition is getting worse- contact your care providers (doctors) promptly- reaching out to your doctor early when you have concerns can prevent you from having to go to the hospital Ambulatory drain care Drain daily, up to max of 1L until patient is only able to drain out . If <144ml  for 3 consecutive drains every other day, then call Interventional Radiology [phone]) for evaluation and possible removal.  If you are experiencing a Mental Health or Behavioral Health Crisis or need someone to talk to, please  call the Suicide and Crisis Lifeline: 988 call the [place]  National Suicide Prevention Lifeline: (573) 078-7130 or TTY:  925-568-3281 TTY 628 814 7809) to talk to a trained counselor call 1-800-273-TALK (toll free, 24 hour hotline) go to [facility] [address], [place] [phone]) call the [facility] Crisis Line: 4162756124 call 911   Care plan and visit instructions communicated with the patient's caregiver verbally today. Caregiver agrees to receive a copy in MyChart. Active MyChart status and caregiver understanding of how to access instructions and care plan via MyChart confirmed.     Jadalyn Oliveri Mckinney Kella Splinter, RN, BSN, Media Planner  Transitions of Care  [facility] - [facility] [phone]: direct office

## 2024-10-25 ENCOUNTER — Inpatient Hospital Stay

## 2024-10-25 ENCOUNTER — Other Ambulatory Visit: Payer: Self-pay | Admitting: *Deleted

## 2024-10-25 ENCOUNTER — Other Ambulatory Visit: Payer: Self-pay | Admitting: Physician Assistant

## 2024-10-25 ENCOUNTER — Inpatient Hospital Stay: Admitting: Nurse Practitioner

## 2024-10-25 ENCOUNTER — Encounter: Payer: Self-pay | Admitting: Nurse Practitioner

## 2024-10-25 ENCOUNTER — Inpatient Hospital Stay: Admitting: Physician Assistant

## 2024-10-25 VITALS — BP 101/75 | HR 84 | Temp 98.3°F | Resp 16 | Ht 66.0 in | Wt 133.0 lb

## 2024-10-25 DIAGNOSIS — I4891 Unspecified atrial fibrillation: Secondary | ICD-10-CM | POA: Diagnosis not present

## 2024-10-25 DIAGNOSIS — R110 Nausea: Secondary | ICD-10-CM | POA: Diagnosis not present

## 2024-10-25 DIAGNOSIS — C3411 Malignant neoplasm of upper lobe, right bronchus or lung: Secondary | ICD-10-CM

## 2024-10-25 DIAGNOSIS — D630 Anemia in neoplastic disease: Secondary | ICD-10-CM

## 2024-10-25 DIAGNOSIS — Z515 Encounter for palliative care: Secondary | ICD-10-CM

## 2024-10-25 DIAGNOSIS — Z66 Do not resuscitate: Secondary | ICD-10-CM | POA: Diagnosis not present

## 2024-10-25 DIAGNOSIS — G4700 Insomnia, unspecified: Secondary | ICD-10-CM

## 2024-10-25 DIAGNOSIS — F419 Anxiety disorder, unspecified: Secondary | ICD-10-CM

## 2024-10-25 DIAGNOSIS — G4709 Other insomnia: Secondary | ICD-10-CM

## 2024-10-25 DIAGNOSIS — D649 Anemia, unspecified: Secondary | ICD-10-CM

## 2024-10-25 DIAGNOSIS — C3490 Malignant neoplasm of unspecified part of unspecified bronchus or lung: Secondary | ICD-10-CM | POA: Diagnosis not present

## 2024-10-25 DIAGNOSIS — R530 Neoplastic (malignant) related fatigue: Secondary | ICD-10-CM

## 2024-10-25 LAB — CBC WITH DIFFERENTIAL (CANCER CENTER ONLY)
Abs Immature Granulocytes: 1.58 K/uL — ABNORMAL HIGH (ref 0.00–0.07)
Basophils Absolute: 0.1 K/uL (ref 0.0–0.1)
Basophils Relative: 0 %
Eosinophils Absolute: 0.2 K/uL (ref 0.0–0.5)
Eosinophils Relative: 1 %
HCT: 26.4 % — ABNORMAL LOW (ref 36.0–46.0)
Hemoglobin: 8.3 g/dL — ABNORMAL LOW (ref 12.0–15.0)
Immature Granulocytes: 7 %
Lymphocytes Relative: 5 %
Lymphs Abs: 1 K/uL (ref 0.7–4.0)
MCH: 27.5 pg (ref 26.0–34.0)
MCHC: 31.4 g/dL (ref 30.0–36.0)
MCV: 87.4 fL (ref 80.0–100.0)
Monocytes Absolute: 2.4 K/uL — ABNORMAL HIGH (ref 0.1–1.0)
Monocytes Relative: 11 %
Neutro Abs: 16.2 K/uL — ABNORMAL HIGH (ref 1.7–7.7)
Neutrophils Relative %: 76 %
Platelet Count: 253 K/uL (ref 150–400)
RBC: 3.02 MIL/uL — ABNORMAL LOW (ref 3.87–5.11)
RDW: 15.1 % (ref 11.5–15.5)
Smear Review: NORMAL
WBC Count: 21.3 K/uL — ABNORMAL HIGH (ref 4.0–10.5)
nRBC: 0.6 % — ABNORMAL HIGH (ref 0.0–0.2)

## 2024-10-25 LAB — CMP (CANCER CENTER ONLY)
ALT: 22 U/L (ref 0–44)
AST: 25 U/L (ref 15–41)
Albumin: 3.6 g/dL (ref 3.5–5.0)
Alkaline Phosphatase: 168 U/L — ABNORMAL HIGH (ref 38–126)
Anion gap: 13 (ref 5–15)
BUN: 49 mg/dL — ABNORMAL HIGH (ref 8–23)
CO2: 27 mmol/L (ref 22–32)
Calcium: 9.4 mg/dL (ref 8.9–10.3)
Chloride: 97 mmol/L — ABNORMAL LOW (ref 98–111)
Creatinine: 2.39 mg/dL — ABNORMAL HIGH (ref 0.44–1.00)
GFR, Estimated: 19 mL/min — ABNORMAL LOW (ref >60)
Glucose, Bld: 158 mg/dL — ABNORMAL HIGH (ref 70–99)
Potassium: 4.2 mmol/L (ref 3.5–5.1)
Sodium: 137 mmol/L (ref 135–145)
Total Bilirubin: 0.2 mg/dL (ref 0.0–1.2)
Total Protein: 6.6 g/dL (ref 6.5–8.1)

## 2024-10-25 LAB — ABO/RH: ABO/RH(D): O POS

## 2024-10-25 LAB — SAMPLE TO BLOOD BANK

## 2024-10-25 LAB — PREPARE RBC (CROSSMATCH)

## 2024-10-25 MED ORDER — SODIUM CHLORIDE 0.9 % IV SOLN: Freq: Once | INTRAVENOUS | Status: AC

## 2024-10-25 MED ORDER — DIPHENHYDRAMINE HCL 25 MG PO CAPS
25.0000 mg | ORAL_CAPSULE | Freq: Once | ORAL | Status: AC
  Administered 2024-10-25: 10:00:00 25 mg via ORAL
  Filled 2024-10-25: qty 1

## 2024-10-25 MED ORDER — FUROSEMIDE 20 MG PO TABS: 20.0000 mg | ORAL_TABLET | Freq: Every day | ORAL | 0 refills | Status: AC

## 2024-10-25 MED ORDER — ALPRAZOLAM 1 MG PO TABS: 1.0000 mg | ORAL_TABLET | Freq: Every day | ORAL | 1 refills | Status: DC

## 2024-10-25 MED ORDER — SODIUM CHLORIDE 0.9% IV SOLUTION
250.0000 mL | INTRAVENOUS | Status: DC
  Administered 2024-10-25: 11:00:00 250 mL via INTRAVENOUS

## 2024-10-25 MED ORDER — SODIUM CHLORIDE 0.9% FLUSH: 10.0000 mL | INTRAVENOUS | Status: DC | PRN

## 2024-10-25 MED ORDER — METOPROLOL SUCCINATE ER 25 MG PO TB24: 50.0000 mg | ORAL_TABLET | Freq: Every day | ORAL | 0 refills | Status: DC

## 2024-10-25 MED ORDER — ACETAMINOPHEN 325 MG PO TABS
650.0000 mg | ORAL_TABLET | Freq: Once | ORAL | Status: AC
  Administered 2024-10-25: 10:00:00 650 mg via ORAL
  Filled 2024-10-25: qty 2

## 2024-10-25 MED ORDER — SODIUM CHLORIDE 0.9 % IV SOLN: Freq: Once | INTRAVENOUS | Status: DC

## 2024-10-25 NOTE — Progress Notes (Signed)
 Palliative Medicine [facility]  [phone] [phone]   Name: Carolyn Wells Date: 10/25/2024 MRN: [id number]  DOB: 06-Feb-1941  Patient Care Team: [name], MD as PCP - General (Internal Medicine) Kriste Emeline BRAVO, DO as PCP - Cardiology (Cardiology) Abran Norleen SAILOR, MD as Consulting Physician (Gastroenterology) Prentis Duwaine BROCKS, RN as Oncology Nurse Navigator Abelino Beatris CHRISTELLA, RN as VBCI Care Management Pickenpack-Cousar, Fannie SAILOR, NP as Nurse Practitioner (Hospice and Palliative Medicine)    INTERVAL HISTORY: Carolyn Wells is a 83 y.o. female with oncologic medical history including recently diagnosed lung cancer (08/2024) with mediastinal, right hilar, right supraclavicular, and right axillary adenopathy consistent with metastatic disease. Palliative is seeing patient for symptom management and goals of care.   SOCIAL HISTORY:     reports that she has never smoked. She has never been exposed to tobacco smoke. She has never used smokeless tobacco. She reports that she does not drink alcohol and does not use drugs.  ADVANCE DIRECTIVES:  Patient has a documented directive. Her daughter is her clinical research associate. No life sustaining treatment including artificial feedings.   CODE STATUS: DNR  PAST MEDICAL HISTORY: Past Medical History:  Diagnosis Date   ABDOMINAL INCISIONAL HERNIA 08/13/2009   s/p repair   Allergic rhinitis, cause unspecified 09/28/2011   BRADYCARDIA 09/26/2010   CKD (chronic kidney disease), stage III (HCC) 02/26/2015   Solitary kidney   COLONIC POLYPS, HX OF 08/25/2007   Complication of anesthesia    DIVERTICULOSIS, COLON 08/25/2007   Dysrhythmia    GASTROESOPHAGEAL REFLUX DISEASE 08/04/2007   HYPERLIPIDEMIA 09/27/2010   not on Rx therapy   HYPERTENSION 08/06/2007   Hypothyroidism    INTERNAL HEMORRHOIDS 08/04/2007   LOW BACK PAIN 08/06/2007   scoliosis; DJD; gets injections   Lung cancer (HCC)     OSTEOPENIA 08/04/2007   PAF (paroxysmal atrial fibrillation) (HCC)    a.  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    ALLERGIES:  is allergic to budesonide, elavil  [amitriptyline ], gabapentin , tramadol , hydrocodone, and ipratropium.  MEDICATIONS:  Current Outpatient Medications  Medication Sig Dispense Refill   acetaminophen  (TYLENOL ) 500 MG tablet Take 500 mg by mouth daily as needed for moderate pain (pain score 4-6) or mild pain (pain score 1-3). (Patient not taking: Reported on 10/12/2024)     ALPRAZolam  (XANAX ) 1 MG tablet Take 1 tablet (1 mg total) by mouth at bedtime. 30 tablet 1   Ascorbic Acid (VITAMIN C) 1000 MG tablet Take 1,000 mg by mouth daily.     furosemide  (LASIX ) 20 MG tablet Take 1 tablet (20 mg total) by mouth daily. Take extra dose as needed for weight gain of 3 LB in 1 day, 5 LB in 1 week 60 tablet 0   levothyroxine  (SYNTHROID ) 50 MCG tablet TAKE 1 TABLET BY MOUTH ONCE DAILY BEFORE BREAKFAST APPOINTMENT  REQUIRED  FOR  FUTURE  REFILLS 90 tablet 0   LUMIGAN 0.01 % SOLN Place 1 drop into both eyes at bedtime.      metoprolol  succinate (TOPROL -XL) 25 MG 24 hr tablet Take 2 tablets (50 mg total) by mouth daily. 90 tablet 0   Multiple Vitamins-Minerals (PRESERVISION AREDS 2 PO) Take 1 tablet by mouth daily.     Multiple Vitamins-Minerals (WOMENS 50+  MULTI VITAMIN/MIN PO) Take 1 tablet by mouth daily.     Omega-3 Fatty Acids (FISH OIL TRIPLE STRENGTH) 1400 MG CAPS Take 1,400 mg by mouth daily.     ondansetron  (ZOFRAN ) 8 MG tablet Take 1 tablet (8 mg total) by mouth every 8 (eight) hours as needed for nausea or vomiting. Start on the third day after carboplatin . 30 tablet 1   pantoprazole  (PROTONIX ) 40 MG tablet Take 1 tablet (40 mg total) by mouth daily. TAKE 1 TABLET BY MOUTH ONCE DAILY (Patient  taking differently: Take 40 mg by mouth daily.) 90 tablet 3   Perfluorohexyloctane (MIEBO) 1.338 GM/ML SOLN Place 1.338 mLs into both eyes 4 (four) times daily.     potassium chloride  (KLOR-CON  M) 10 MEQ tablet Take 2 tablets (20 mEq total) by mouth every other day. 60 tablet 0   prochlorperazine  (COMPAZINE ) 10 MG tablet Take 1 tablet (10 mg total) by mouth every 6 (six) hours as needed for nausea or vomiting. 30 tablet 1   [MAR Hold] Rivaroxaban  (XARELTO ) 15 MG TABS tablet Take 1 tablet (15 mg total) by mouth daily with supper.     vitamin k 100 MCG tablet Take 100 mcg by mouth daily.     No current facility-administered medications for this visit.   Facility-Administered Medications Ordered in Other Visits  Medication Dose Route Frequency Provider Last Rate Last Admin   0.9 %  sodium chloride  infusion (Manually program via Guardrails IV Fluids)  250 mL Intravenous Continuous Heilingoetter, Cassandra L, PA-C   Stopped at 10/25/24 1329   sodium chloride  flush (NS) 0.9 % injection 10 mL  10 mL Intracatheter PRN Heilingoetter, Cassandra L, PA-C        VITAL SIGNS: There were no vitals taken for this visit. There were no vitals filed for this visit.  Estimated body mass index is 21.47 kg/m as calculated from the following:   Height as of an earlier encounter on 10/25/24: 5' 6 (1.676 m).   Weight as of an earlier encounter on 10/25/24: 133 lb (60.3 kg).   PERFORMANCE STATUS (ECOG) : 2 - Symptomatic, <50% confined to bed  Physical Exam General: NAD, weak appearing  Cardiovascular: Irregularly Irregular  Pulmonary: normal breathing pattern Extremities: no edema, no joint deformities Skin: no rashes Neurological: AAO x3  IMPRESSION: Discussed the use of AI scribe software for clinical note transcription with the patient, who gave verbal consent to proceed.  History of Present Illness Carolyn Wells is a 83 year old female with recently diagnosed metastatic lung cancer who was seen  during [facility] for symptom management follow-up.  She is accompanied by her daughter and husband.  Patient resting comfortably in recliner.  Denies concerns of nausea, vomiting, constipation, or diarrhea.  Reports ongoing fatigue and decreased appetite.  Nausea controlled with Compazine  as needed.  She experiences dizziness and lightheadedness, which have contributed to a poor appetite. Her weight has remained stable between 130 and 133 pounds. She consumes two Boost nutritional supplements daily to supplement her diet.  Patient is receiving 500 mL of IV fluids and 1 unit packed red blood cells as she is symptomatic.  Her medication regimen has recently been adjusted by her cardiology team due to low blood pressure.  Per daughter amlodipine  was discontinued, and her metoprolol  dose was reduced from three 25 mg pills to two. Additionally, her furosemide  dose was decreased from 40 mg to 20 mg daily. These changes began this morning.  Ms. Stough denies concerns of uncontrolled pain.  She is reporting difficulty with sleeping and has been using her daughter's Xanax , 1 mg, as needed for the past month. Although she does not take it every night, it helps her sleep better when she experiences difficulty. Patient was initially prescribed but unfortunately ran out of her own supply. We discussed use and insomnia. She and daughter verbalized understanding.   All questions answered and support provided.    Assessment & Plan Anemia Likely contributing to dizziness, lightheadedness, and fatigue. Recent fluid and blood transfusion administered to improve symptoms. - Administered fluids and blood transfusion to improve symptoms as discussed with Cassie, PA.  Atrial fibrillation/Hypotension Chronic atrial fibrillation with recent medication adjustments due to hypotension. Amlodipine  discontinued, metoprolol  reduced from 325 mg to 250 mg daily, and furosemide  reduced from 40 mg to 20 mg daily. Recent changes aim to  stabilize blood pressure and improve symptoms per Cardiology.   Insomnia Chronic insomnia managed with Xanax  1 mg as needed for sleep. She has been using daughter's prescription due to lack of her own prescription. Xanax  has been effective in improving sleep quality. - Prescribed Xanax  1 mg as needed for sleep  Nausea Intermittent nausea managed with Compazine , which has been effective in alleviating symptoms. - Continue Compazine  as needed for nausea  I will plan to see patient back in 2-3 weeks. Sooner if needed.    Patient expressed understanding and was in agreement with this plan. She also understands that She can call the clinic at any time with any questions, concerns, or complaints.   Any controlled substances utilized were prescribed in the context of palliative care. PDMP has been reviewed.   Visit consisted of counseling and education dealing with the complex and emotionally intense issues of symptom management and palliative care in the setting of serious and potentially life-threatening illness.  Levon Borer, AGPCNP-BC  Palliative Medicine Team/[facility]

## 2024-10-25 NOTE — Patient Instructions (Signed)
 Rehydration, Older Adult  Rehydration is the replacement of fluids, salts, and minerals in the body (electrolytes) that are lost during dehydration. Dehydration is when there is not enough water or other fluids in the body. This happens when you lose more fluids than you take in. People who are age 84 or older have a higher risk of dehydration than younger adults. This is because in older age, the body: Is less able to maintain the right amount of water. Does not respond to temperature changes as well. Does not get a sense of thirst as easily or quickly. Other causes include: Not drinking enough fluids. This can occur when you are ill, when you forget to drink, or when you are doing activities that require a lot of energy, especially in hot weather. Conditions that cause loss of water or other fluids. These include diarrhea, vomiting, sweating, or urinating a lot. Other illnesses, such as fever or infection. Certain medicines, such as those that remove excess fluid from the body (diuretics). Symptoms of mild or moderate dehydration may include thirst, dry lips and mouth, and dizziness. Symptoms of severe dehydration may include increased heart rate, confusion, fainting, and not urinating. In severe cases, you may need to get fluids through an IV at the hospital. For mild or moderate cases, you can usually rehydrate at home by drinking certain fluids as told by your health care provider. What are the risks? Rehydration is usually safe. Taking in too much fluid (overhydration) can be a problem but is rare. Overhydration can cause an imbalance of electrolytes in the body, kidney failure, fluid in the lungs, or a decrease in salt (sodium) levels in the body. Supplies needed: You will need an oral rehydration solution (ORS) if your health care provider tells you to use one. This is a drink to treat dehydration. It can be found in pharmacies and retail stores. How to rehydrate Fluids Follow  instructions from your health care provider about what to drink. The kind of fluid and the amount you should drink depend on your condition. In general, you should choose drinks that you prefer. If told by your health care provider, drink an ORS. Make an ORS by following instructions on the package. Start by drinking small amounts, about  cup (120 mL) every 5-10 minutes. Slowly increase how much you drink until you have taken in the amount recommended by your health care provider. Drink enough clear fluids to keep your urine pale yellow. If you were told to drink an ORS, finish it first, then start slowly drinking other clear fluids. Drink fluids such as: Water. This includes sparkling and flavored water. Drinking only water can lead to having too little sodium in your body (hyponatremia). Follow the advice of your health care provider. Water from ice chips you suck on. Fruit juice with water added to it(diluted). Sports drinks. Hot or cold herbal teas. Broth-based soups. Coffee. Milk or milk products. Food Follow instructions from your health care provider about what to eat while you rehydrate. Your health care provider may recommend that you slowly begin eating regular foods in small amounts. Eat foods that contain a healthy balance of electrolytes, such as bananas, oranges, potatoes, tomatoes, and spinach. Avoid foods that are greasy or contain a lot of sugar. In some cases, you may get nutrition through a feeding tube that is passed through your nose and into your stomach (nasogastric tube, or NG tube). This may be done if you have uncontrolled vomiting or diarrhea. Drinks to avoid  Certain drinks may make dehydration worse. While you rehydrate, avoid drinking alcohol. How to tell if you are recovering from dehydration You may be getting better if: You are urinating more often than before you started rehydrating. Your urine is pale yellow. Your energy level improves. You vomit less  often. You have diarrhea less often. Your appetite improves or returns to normal. You feel less dizzy or light-headed. Your skin tone and color start to look more normal. Follow these instructions at home: Take over-the-counter and prescription medicines only as told by your health care provider. Do not take sodium tablets. Doing this can lead to having too much sodium in your body (hypernatremia). Contact a health care provider if: You continue to have symptoms of mild or moderate dehydration, such as: Thirst. Dry lips. Slightly dry mouth. Dizziness. Dark urine or less urine than usual. Muscle cramps. You continue to vomit or have diarrhea. Get help right away if: You have symptoms of dehydration that get worse. You have a fever. You have a severe headache. You have been vomiting and have problems, such as: Your vomiting gets worse. Your vomit includes blood or green matter (bile). You cannot eat or drink without vomiting. You have problems with urination or bowel movements, such as: Diarrhea that gets worse. Blood in your stool (feces). This may cause stool to look black and tarry. Not urinating, or urinating only a small amount of very dark urine, within 6-8 hours. You have trouble breathing. You have symptoms that get worse with treatment. These symptoms may be an emergency. Get help right away. Call 911. Do not wait to see if the symptoms will go away. Do not drive yourself to the hospital. This information is not intended to replace advice given to you by your health care provider. Make sure you discuss any questions you have with your health care provider. Blood Transfusion, Adult, Care After After a blood transfusion, it is common to have: Bruising and soreness at the IV site. A headache. Follow these instructions at home: Your doctor may give you more instructions. If you have problems, contact your doctor. Insertion site care     Follow instructions from your  doctor about how to take care of your insertion site. This is where an IV tube was put into your vein. Make sure you: Wash your hands with soap and water for at least 20 seconds before and after you change your bandage. If you cannot use soap and water, use hand sanitizer. Change your bandage as told by your doctor. Check your insertion site every day for signs of infection. Check for: Redness, swelling, or pain. Bleeding from the site. Warmth. Pus or a bad smell. General instructions Take over-the-counter and prescription medicines only as told by your doctor. Rest as told by your doctor. Go back to your normal activities as told by your doctor. Keep all follow-up visits. You may need to have tests at certain times to check your blood. Contact a doctor if: You have itching or red, swollen areas of skin (hives). You have a fever or chills. You have pain in the head, back, or chest. You feel worried or nervous (anxious). You feel weak after doing your normal activities. You have any of these problems at the insertion site: Redness, swelling, warmth, or pain. Bleeding that does not stop with pressure. Pus or a bad smell. If you received your blood transfusion in an outpatient setting, you will be told whom to contact to report any reactions. Get  help right away if: You have signs of a serious reaction. This may be coming from an allergy or the body's defense system (immune system). Signs include: Trouble breathing or shortness of breath. Swelling of the face or feeling warm (flushed). A widespread rash. Dark pee (urine) or blood in the pee. Fast heartbeat. These symptoms may be an emergency. Get help right away. Call 911. Do not wait to see if the symptoms will go away. Do not drive yourself to the hospital. Summary Bruising and soreness at the IV site are common. Check your insertion site every day for signs of infection. Rest as told by your doctor. Go back to your normal  activities as told by your doctor. Get help right away if you have signs of a serious reaction. This information is not intended to replace advice given to you by your health care provider. Make sure you discuss any questions you have with your health care provider. Document Revised: 01/23/2022 Document Reviewed: 01/23/2022

## 2024-10-26 LAB — BPAM RBC
Blood Product Expiration Date: 202601132359
ISSUE DATE / TIME: 202512171123
Unit Type and Rh: 5100

## 2024-10-26 LAB — TYPE AND SCREEN
ABO/RH(D): O POS
Antibody Screen: NEGATIVE
Unit division: 0

## 2024-10-28 ENCOUNTER — Emergency Department (HOSPITAL_COMMUNITY)

## 2024-10-28 ENCOUNTER — Emergency Department (HOSPITAL_COMMUNITY)
Admission: EM | Admit: 2024-10-28 | Discharge: 2024-10-28 | Disposition: A | Discharge status: Home / Self Care | Attending: Emergency Medicine | Admitting: Emergency Medicine

## 2024-10-28 ENCOUNTER — Other Ambulatory Visit: Payer: Self-pay

## 2024-10-28 DIAGNOSIS — Z85118 Personal history of other malignant neoplasm of bronchus and lung: Secondary | ICD-10-CM | POA: Diagnosis not present

## 2024-10-28 DIAGNOSIS — M5432 Sciatica, left side: Secondary | ICD-10-CM | POA: Insufficient documentation

## 2024-10-28 DIAGNOSIS — M79605 Pain in left leg: Secondary | ICD-10-CM | POA: Diagnosis present

## 2024-10-28 DIAGNOSIS — M79662 Pain in left lower leg: Secondary | ICD-10-CM

## 2024-10-28 LAB — I-STAT CHEM 8, ED
BUN: 23 mg/dL (ref 8–23)
Calcium, Ion: 1.07 mmol/L — ABNORMAL LOW (ref 1.15–1.40)
Chloride: 102 mmol/L (ref 98–111)
Creatinine, Ser: 1.8 mg/dL — ABNORMAL HIGH (ref 0.44–1.00)
Glucose, Bld: 176 mg/dL — ABNORMAL HIGH (ref 70–99)
HCT: 27 % — ABNORMAL LOW (ref 36.0–46.0)
Hemoglobin: 9.2 g/dL — ABNORMAL LOW (ref 12.0–15.0)
Potassium: 4 mmol/L (ref 3.5–5.1)
Sodium: 136 mmol/L (ref 135–145)
TCO2: 20 mmol/L — ABNORMAL LOW (ref 22–32)

## 2024-10-28 LAB — CBC WITH DIFFERENTIAL/PLATELET
Abs Immature Granulocytes: 0.37 K/uL — ABNORMAL HIGH (ref 0.00–0.07)
Basophils Absolute: 0.1 K/uL (ref 0.0–0.1)
Basophils Relative: 0 %
Eosinophils Absolute: 0 K/uL (ref 0.0–0.5)
Eosinophils Relative: 0 %
HCT: 28 % — ABNORMAL LOW (ref 36.0–46.0)
Hemoglobin: 8.7 g/dL — ABNORMAL LOW (ref 12.0–15.0)
Immature Granulocytes: 2 %
Lymphocytes Relative: 4 %
Lymphs Abs: 0.7 K/uL (ref 0.7–4.0)
MCH: 28.6 pg (ref 26.0–34.0)
MCHC: 31.1 g/dL (ref 30.0–36.0)
MCV: 92.1 fL (ref 80.0–100.0)
Monocytes Absolute: 1.3 K/uL — ABNORMAL HIGH (ref 0.1–1.0)
Monocytes Relative: 7 %
Neutro Abs: 16.4 K/uL — ABNORMAL HIGH (ref 1.7–7.7)
Neutrophils Relative %: 87 %
Platelets: 130 K/uL — ABNORMAL LOW (ref 150–400)
RBC: 3.04 MIL/uL — ABNORMAL LOW (ref 3.87–5.11)
RDW: 16.1 % — ABNORMAL HIGH (ref 11.5–15.5)
WBC: 18.9 K/uL — ABNORMAL HIGH (ref 4.0–10.5)
nRBC: 0 % (ref 0.0–0.2)

## 2024-10-28 LAB — BASIC METABOLIC PANEL WITH GFR
Anion gap: 13 (ref 5–15)
BUN: 27 mg/dL — ABNORMAL HIGH (ref 8–23)
CO2: 22 mmol/L (ref 22–32)
Calcium: 8.2 mg/dL — ABNORMAL LOW (ref 8.9–10.3)
Chloride: 100 mmol/L (ref 98–111)
Creatinine, Ser: 1.73 mg/dL — ABNORMAL HIGH (ref 0.44–1.00)
GFR, Estimated: 29 mL/min — ABNORMAL LOW
Glucose, Bld: 176 mg/dL — ABNORMAL HIGH (ref 70–99)
Potassium: 4.2 mmol/L (ref 3.5–5.1)
Sodium: 135 mmol/L (ref 135–145)

## 2024-10-28 MED ORDER — PREDNISONE 10 MG PO TABS: 40.0000 mg | ORAL_TABLET | Freq: Every day | ORAL | 0 refills | Status: AC

## 2024-10-28 MED ORDER — HEPARIN SOD (PORK) LOCK FLUSH 100 UNIT/ML IV SOLN
500.0000 [IU] | Freq: Once | INTRAVENOUS | Status: AC
  Administered 2024-10-28: 500 [IU] via INTRAVENOUS
  Filled 2024-10-28: qty 5

## 2024-10-28 MED ORDER — ACETAMINOPHEN 500 MG PO TABS
1000.0000 mg | ORAL_TABLET | Freq: Once | ORAL | Status: AC
  Administered 2024-10-28: 1000 mg via ORAL
  Filled 2024-10-28: qty 2

## 2024-10-28 MED ORDER — PREDNISONE 20 MG PO TABS
40.0000 mg | ORAL_TABLET | Freq: Once | ORAL | Status: AC
  Administered 2024-10-28: 40 mg via ORAL
  Filled 2024-10-28: qty 2

## 2024-10-28 MED ORDER — SODIUM CHLORIDE 0.9 % IV BOLUS
1000.0000 mL | Freq: Once | INTRAVENOUS | Status: AC
  Administered 2024-10-28: 1000 mL via INTRAVENOUS

## 2024-10-28 NOTE — ED Notes (Signed)
 Patient transported to MRI

## 2024-10-28 NOTE — ED Notes (Signed)
 Pt wants labs drawn from port. RN aware

## 2024-10-28 NOTE — Progress Notes (Signed)
 VASCULAR LAB    Left lower extremity venous duplex has been performed.  See CV proc for preliminary results.  Gave verbal report to Dr. Laurice LIS, [facility], RVT 10/28/2024, 2:53 PM

## 2024-10-28 NOTE — ED Provider Notes (Signed)
 "  EMERGENCY DEPARTMENT AT [facility] Provider Note   CSN: [id number] Arrival date & time: 10/28/24  1100     Patient presents with: Leg Pain   Carolyn Wells is a 83 y.o. female.   83 year old female with prior medical history as detailed below presents for evaluation.  Patient complains of left low back and left leg pain.  She has known history of metastatic lung cancer.  She reports minimal pain control with Tylenol  alone.  Symptoms began over the last 2 to 3 days.  She denies fever.  She denies weakness.  She is ambulatory - however, she reports significant pain with ambulation or movement of the left leg.  The history is provided by the patient and medical records.       Prior to Admission medications  Medication Sig Start Date End Date Taking? Authorizing Provider  acetaminophen  (TYLENOL ) 500 MG tablet Take 500 mg by mouth daily as needed for moderate pain (pain score 4-6) or mild pain (pain score 1-3). Patient not taking: Reported on 10/12/2024    [provider]  ALPRAZolam  (XANAX ) 1 MG tablet Take 1 tablet (1 mg total) by mouth at bedtime. 10/25/24   Pickenpack-Cousar, Fannie SAILOR, NP  Ascorbic Acid (VITAMIN C) 1000 MG tablet Take 1,000 mg by mouth daily.    [provider]  furosemide  (LASIX ) 20 MG tablet Take 1 tablet (20 mg total) by mouth daily. Take extra dose as needed for weight gain of 3 LB in 1 day, 5 LB in 1 week 10/25/24   Segal, Jared E, DO  levothyroxine  (SYNTHROID ) 50 MCG tablet TAKE 1 TABLET BY MOUTH ONCE DAILY BEFORE BREAKFAST APPOINTMENT  REQUIRED  FOR  FUTURE  REFILLS 09/04/24   [name], MD  LUMIGAN 0.01 % SOLN Place 1 drop into both eyes at bedtime.  08/08/15   [provider]  metoprolol  succinate (TOPROL -XL) 25 MG 24 hr tablet Take 2 tablets (50 mg total) by mouth daily. 10/25/24   Segal, Jared E, DO  Multiple Vitamins-Minerals (PRESERVISION AREDS 2 PO) Take 1 tablet by mouth daily.    [provider]  Multiple Vitamins-Minerals (WOMENS 50+ MULTI VITAMIN/MIN PO) Take 1 tablet by mouth daily.    [provider]  Omega-3 Fatty Acids (FISH OIL TRIPLE STRENGTH) 1400 MG CAPS Take 1,400 mg by mouth daily.    [provider]  ondansetron  (ZOFRAN ) 8 MG tablet Take 1 tablet (8 mg total) by mouth every 8 (eight) hours as needed for nausea or vomiting. Start on the third day after carboplatin . 09/26/24   Sherrod Sherrod, MD  pantoprazole  (PROTONIX ) 40 MG tablet Take 1 tablet (40 mg total) by mouth daily. TAKE 1 TABLET BY MOUTH ONCE DAILY Patient taking differently: Take 40 mg by mouth daily. 05/08/24   [name], MD  Perfluorohexyloctane (MIEBO) 1.338 GM/ML SOLN Place 1.338 mLs into both eyes 4 (four) times daily.    [provider]  potassium chloride  (KLOR-CON  M) 10 MEQ tablet Take 2 tablets (20 mEq total) by mouth every other day. 10/07/24   Fairy Frames, MD  prochlorperazine  (COMPAZINE ) 10 MG tablet Take 1 tablet (10 mg total) by mouth every 6 (six) hours as needed for nausea or vomiting. 09/26/24   Sherrod Sherrod, MD  [Paused] Rivaroxaban  (XARELTO ) 15 MG TABS tablet Take 1 tablet (15 mg total) by mouth daily with supper. Wait to take this until your doctor or other care provider tells you to start again. 10/10/24  Fairy Frames, MD  vitamin k 100 MCG tablet Take 100 mcg by mouth daily.    [provider]    Allergies: Budesonide, Elavil  [amitriptyline ], Gabapentin , Tramadol , Hydrocodone, and Ipratropium    Review of Systems  All other systems reviewed and are negative.   Updated Vital Signs BP (!) 118/90   Pulse 68   Temp 98.4 F (36.9 C)   Resp 20   SpO2 98%   Physical Exam Vitals and nursing note reviewed.  Constitutional:      General: She is not in acute distress.    Appearance: She is well-developed.  HENT:     Head: Normocephalic and atraumatic.  Eyes:     Conjunctiva/sclera: Conjunctivae normal.  Cardiovascular:     Rate and  Rhythm: Normal rate and regular rhythm.     Heart sounds: No murmur heard. Pulmonary:     Effort: Pulmonary effort is normal. No respiratory distress.     Breath sounds: Normal breath sounds.  Abdominal:     Palpations: Abdomen is soft.     Tenderness: There is no abdominal tenderness.  Musculoskeletal:        General: No swelling.     Cervical back: Neck supple.     Comments: Both lower extremities are neurovascularly intact  Skin:    General: Skin is warm and dry.     Capillary Refill: Capillary refill takes less than 2 seconds.  Neurological:     Mental Status: She is alert.  Psychiatric:        Mood and Affect: Mood normal.     (all labs ordered are listed, but only abnormal results are displayed) Labs Reviewed  CBC WITH DIFFERENTIAL/PLATELET  BASIC METABOLIC PANEL WITH GFR  I-STAT CHEM 8, ED    EKG: None  Radiology: No results found.   Procedures   Medications Ordered in the ED  acetaminophen  (TYLENOL ) tablet 1,000 mg (1,000 mg Oral Given 10/28/24 1136)                                    Medical Decision Making Patient is presenting with left leg pain.  Pain described as most consistent with likely sciatica.  She is neurologically intact.  MRI obtained of lumbar spine does not show tumor or abscess or mass.  Patient would likely benefit from short burst of prednisone  to treat her sciatica.  Additionally, patient was found to have DVT in the left leg and superficial clot in the right leg.  She did have a brief holiday from her Xarelto  within the last 2 weeks.  I suspect that the clot may be secondary to this lapse in her Xarelto  treatment.  She is now on Xarelto .  Case discussed both with the patient and the patient's daughter.  The family agrees that transitioning at this time to Lovenox  injections and or Coumadin would be difficult.  I instructed the patient and patient's family to follow-up with their PCP and oncology team on Monday.  Patient should get a  repeat ultrasound of the left leg and check on progression of the clot.  If she has true failure of Xarelto  then she would need to have a change in her anticoagulation.  Importance of close follow-up was stressed.  Strict return precautions given and understood.  Both patient and patient's daughter understand plan of care and need for close follow-up in the outpatient setting.  Amount and/or Complexity of Data  Reviewed Labs: ordered. Radiology: ordered.  Risk OTC drugs. Prescription drug management.        Final diagnoses:  Sciatica of left side    ED Discharge Orders     None          Laurice Maude BROCKS, MD 10/28/24 2115  "

## 2024-10-28 NOTE — ED Provider Triage Note (Signed)
 Emergency Medicine Provider Triage Evaluation Note  Carolyn Wells , a 83 y.o. female  was evaluated in triage.  Pt complains of left low back pain with radiation to the left leg.  Pain has been present for at least 2 days.  Pain is worse with movement or standing.  She is taking some Tylenol  at home with minimal improvement in pain.  She denies fever.  She denies weakness.  She complains of pain with palpation of the left calf and left thigh.  She is able to range her left leg without difficulty while in triage.  Review of Systems  Positive: Left low back pain with radiation down the left leg Negative: Fever, weakness  Physical Exam  BP (!) 118/90   Pulse 68   Temp 98.4 F (36.9 C)   Resp 20   SpO2 98%  Gen:   Awake, no distress   Resp:  Normal effort MSK:   Moves extremities without difficulty, complains of pain to the entire left leg.  Appears to be tender with palpation of the left thigh, left calf.  Distal left lower extremity is neurovascular intact. Other:    Medical Decision Making  Medically screening exam initiated at 11:26 AM.  Appropriate orders placed.  Lennart [name] was informed that the remainder of the evaluation will be completed by another provider, this initial triage assessment does not replace that evaluation, and the importance of remaining in the ED until their evaluation is complete.     Laurice Maude BROCKS, MD 10/28/24 [phone]

## 2024-10-28 NOTE — Discharge Instructions (Signed)
 Return for any problem.  Your pain today is most likely related to sciatica to the left leg.  This will likely improve with use of prednisone  to help reduce inflammation.  Additionally on your workup today we found evidence of a deep vein thrombosis in your left leg.  I am concerned that this may have started when you had your brief holiday from Xarelto  within the last week or 2.  I would recommend that you continue Xarelto  at this time.  Please contact your regular doctor or your oncology doctors on Monday regarding this finding.  I think you will need to have a repeat ultrasound within the next week to make sure that the clot is not growing.  The alternative to Xarelto  is Lovenox  shots or Coumadin.  Both of these medications are a lot less easy to use than Xarelto .  If you develop any sort of chest pain or trouble breathing in the interim please return immediately to the ED.  You would need to be evaluated for blood clot in your lungs at that point.

## 2024-10-28 NOTE — ED Triage Notes (Signed)
 Patient via EMS for eval of constant L calf pain x 2 days. Pain worse with ambulation. Taking Tylenol  without relief.

## 2024-10-30 ENCOUNTER — Other Ambulatory Visit: Payer: Self-pay | Admitting: Internal Medicine

## 2024-10-30 DIAGNOSIS — C3411 Malignant neoplasm of upper lobe, right bronchus or lung: Secondary | ICD-10-CM

## 2024-10-31 ENCOUNTER — Other Ambulatory Visit: Payer: Self-pay

## 2024-10-31 ENCOUNTER — Inpatient Hospital Stay

## 2024-10-31 ENCOUNTER — Other Ambulatory Visit: Payer: Self-pay | Admitting: *Deleted

## 2024-10-31 ENCOUNTER — Encounter (HOSPITAL_COMMUNITY): Payer: Self-pay | Admitting: Internal Medicine

## 2024-10-31 ENCOUNTER — Ambulatory Visit (HOSPITAL_COMMUNITY)
Admission: RE | Admit: 2024-10-31 | Discharge: 2024-10-31 | Disposition: A | Discharge status: Home / Self Care | Source: Ambulatory Visit | Attending: Physician Assistant | Admitting: Physician Assistant

## 2024-10-31 ENCOUNTER — Other Ambulatory Visit (HOSPITAL_COMMUNITY): Payer: Self-pay | Admitting: Physician Assistant

## 2024-10-31 DIAGNOSIS — C3411 Malignant neoplasm of upper lobe, right bronchus or lung: Secondary | ICD-10-CM

## 2024-10-31 DIAGNOSIS — J910 Malignant pleural effusion: Secondary | ICD-10-CM

## 2024-10-31 LAB — CBC WITH DIFFERENTIAL (CANCER CENTER ONLY)
Abs Immature Granulocytes: 0.57 K/uL — ABNORMAL HIGH (ref 0.00–0.07)
Basophils Absolute: 0.1 K/uL (ref 0.0–0.1)
Basophils Relative: 0 %
Eosinophils Absolute: 0 K/uL (ref 0.0–0.5)
Eosinophils Relative: 0 %
HCT: 28.8 % — ABNORMAL LOW (ref 36.0–46.0)
Hemoglobin: 9.2 g/dL — ABNORMAL LOW (ref 12.0–15.0)
Immature Granulocytes: 2 %
Lymphocytes Relative: 2 %
Lymphs Abs: 0.6 K/uL — ABNORMAL LOW (ref 0.7–4.0)
MCH: 28.5 pg (ref 26.0–34.0)
MCHC: 31.9 g/dL (ref 30.0–36.0)
MCV: 89.2 fL (ref 80.0–100.0)
Monocytes Absolute: 0.7 K/uL (ref 0.1–1.0)
Monocytes Relative: 3 %
Neutro Abs: 23.2 K/uL — ABNORMAL HIGH (ref 1.7–7.7)
Neutrophils Relative %: 93 %
Platelet Count: 121 K/uL — ABNORMAL LOW (ref 150–400)
RBC: 3.23 MIL/uL — ABNORMAL LOW (ref 3.87–5.11)
RDW: 16.5 % — ABNORMAL HIGH (ref 11.5–15.5)
WBC Count: 25.1 K/uL — ABNORMAL HIGH (ref 4.0–10.5)
nRBC: 0 % (ref 0.0–0.2)

## 2024-10-31 LAB — CMP (CANCER CENTER ONLY)
ALT: 41 U/L (ref 0–44)
AST: 30 U/L (ref 15–41)
Albumin: 3.5 g/dL (ref 3.5–5.0)
Alkaline Phosphatase: 253 U/L — ABNORMAL HIGH (ref 38–126)
Anion gap: 12 (ref 5–15)
BUN: 40 mg/dL — ABNORMAL HIGH (ref 8–23)
CO2: 24 mmol/L (ref 22–32)
Calcium: 9.3 mg/dL (ref 8.9–10.3)
Chloride: 107 mmol/L (ref 98–111)
Creatinine: 1.8 mg/dL — ABNORMAL HIGH (ref 0.44–1.00)
GFR, Estimated: 27 mL/min — ABNORMAL LOW
Glucose, Bld: 117 mg/dL — ABNORMAL HIGH (ref 70–99)
Potassium: 4.5 mmol/L (ref 3.5–5.1)
Sodium: 143 mmol/L (ref 135–145)
Total Bilirubin: <0.2 mg/dL (ref 0.0–1.2)
Total Protein: 6.5 g/dL (ref 6.5–8.1)

## 2024-10-31 LAB — SAMPLE TO BLOOD BANK

## 2024-10-31 NOTE — Transitions of Care (Post Inpatient/ED Visit) (Signed)
 " Transition of Care week 4/ day # 21  Visit Note  10/31/2024  Name: Carolyn Wells MRN: [id number]          DOB: [birth year]  Situation: Patient enrolled in [facility] 30-day program. Visit completed with patient and her daughter- caregiver Carolyn Wells, verified on [facility] DPR by telephone with phone on speaker mode   HIPAA identifiers x 2 verified  Background:  Recent hospitalization November 24-29, 2025 for CHF: recurrent (R) pleural effusion in setting of stage IV lung CA Resides alone, independent in most self-care activities; daughter- caregiver manages health care needs- lives 6 miles from patient and visits patient daily- attends all appointments with patient and provides all transportation; no current assistive devices (1) unplanned hospitalization x last (6)/ (12) months  Initial Transition Care Management Follow-up Telephone Call Discharge Date and Diagnosis: 10/07/24, Recurrent R pleural effusion / CHF in setting of stage IV lung ca   Past Medical History:  Diagnosis Date   ABDOMINAL INCISIONAL HERNIA 08/13/2009   s/p repair   Allergic rhinitis, cause unspecified 09/28/2011   BRADYCARDIA 09/26/2010   CKD (chronic kidney disease), stage III (HCC) 02/26/2015   Solitary kidney   COLONIC POLYPS, HX OF 08/25/2007   Complication of anesthesia    DIVERTICULOSIS, COLON 08/25/2007   Dysrhythmia    GASTROESOPHAGEAL REFLUX DISEASE 08/04/2007   HYPERLIPIDEMIA 09/27/2010   not on Rx therapy   HYPERTENSION 08/06/2007   Hypothyroidism    INTERNAL HEMORRHOIDS 08/04/2007   LOW BACK PAIN 08/06/2007   scoliosis; DJD; gets injections   Lung cancer (HCC)    OSTEOPENIA 08/04/2007   PAF (paroxysmal atrial fibrillation) (HCC)    a.  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   Assessment:  per patient and caregiver-  daughter while phone on speaker mode:  Doing overall okay but we did end up in the ER on Saturday night- her leg pain was worsening and they discovered that she has sciatica and a blood clot in her (L) leg; re-started the xarelto  and gave her prednisone ; we went today to the port flush and they did repeat labs and a chest x-ray to determine if she needs to have the lung draining procedure tomorrow- they said they would call us  if she does need to go for that; other than that- she pretty much feels better than she did last week, other than the leg pain; dizziness is better and still no falls    Denies clinical concerns and sounds to be in no distress throughout [facility] 30-day program outreach call today  Patient Reported Symptoms: Cognitive Cognitive Status: Normal speech and language skills, Alert and oriented to person, place, and time, Insightful and able to interpret abstract concepts, Requires Assistance Decision Making (confirmed caregiver- daughter Carolyn Wells continues to participate in patient's daily care and manages all medical affairs for patient)      Neurological Neurological Review of Symptoms: Dizziness Oher Neurological Symptoms/Conditions [RPT]: Continues to report ongoing occasional self-limiting epsiodes of chronic dizziness; states this is getting better than it was last week and the week before; this has been going on for a long time and I just live with it, the doctors know all about it Neurological Management Strategies: Coping strategies, Routine screening, Adequate rest  HEENT HEENT Symptoms Reported: No symptoms reported  Cardiovascular Cardiovascular Symptoms Reported: Dizziness, Lightheadness Other Cardiovascular Symptoms: Continues to report chronic dizziness/ lightheadedness; continues monitoring blood pressures at home: they have come up since we stopped the amlodipine  confirmed continues monitoring and recording daily weights at home; they took a CXR at the oncology  office visit today to see if I need to have the lung fluid drained- it is already scheduled for tomorrow. just in case the CXR says it needs to be done confirmed continues taking diuretic as prescribed; confirmed as re-started Xarelto  post ED visit 10/28/24 as instructed Does patient have uncontrolled Hypertension?: No Cardiovascular Management Strategies: Routine screening, Coping strategies, Weight management, Adequate rest Do You Have a Working Readable Scale?: Yes Weight: 133 lb (60.3 kg) (home reported weight from 10/31/24)  Respiratory Respiratory Symptoms Reported: No symptoms reported Other Respiratory Symptoms: My breathing is fine, but I do feel a little tightness in my chest; no real shortness of breath, just a little tightness; the oncologist did a chest x-ray today to see if I need to go to the appointment for lung draining tomorrow- it is already scheduled; Sounds to be in no respiratory distress throughout [facility] call Respiratory Management Strategies: Weight management, Medication therapy, Coping strategies, Adequate rest, Asthma action plan, Routine screening, Fluid modification  Endocrine Endocrine Symptoms Reported: No symptoms reported Is patient diabetic?: No    Gastrointestinal Gastrointestinal Symptoms Reported: No symptoms reported Additional Gastrointestinal Details: Eating better because the prednisone  they gave her with the ED visit; confirms having normal and regular BM's with last BM reported as this morning      Genitourinary Genitourinary Symptoms Reported: No symptoms reported    Integumentary Integumentary Symptoms Reported: No symptoms reported Additional Integumentary Details: We went for the port flush today and they said the port site looks good Skin Management Strategies: Routine screening, Coping strategies  Musculoskeletal Musculoskelatal Symptoms Reviewed: Limited mobility, Weakness Other Musculoskeletal Symptoms: confirmed not currently  requiring/ using assistive devices for ambulation despite her reports of intermittent self-limiting episodes of dizziness as per baseline: had pointed discussion with patient and caregiver around benefits of using assistive devices, as well as complications of falls in setting of ACT; patient states I am very very careful not to fall; states the walker just makes my walking more unsteady and she again declines need to use; confirms for me no new/ recent falls post- hospital discharge on 10/07/24 Musculoskeletal Management Strategies: Coping strategies, Routine screening   Fall risk Follow up: Education provided, Falls prevention discussed  Psychosocial Psychosocial Symptoms Reported: No symptoms reported Behavioral Management Strategies: Adequate rest, Support system, Coping strategies Major Change/Loss/Stressor/Fears (CP): Medical condition, self Techniques to Cope with Loss/Stress/Change: Diversional activities Quality of Family Relationships: supportive, involved, helpful   Today's Vitals   10/31/24 1400  BP: 110/70  Weight: 133 lb (60.3 kg)   Pain Scale: 0-10 Pain Score: 8  Pain Type: Acute pain Pain Location: Leg Pain Orientation: Left, Other (Comment) (behind my knee, from the blood clot they found in the ER on 10/28/24) Pain Descriptors / Indicators: Constant, Discomfort, Dull, Heaviness Pain Onset: On-going Patients Stated Pain Goal: 3 Pain Intervention(s): Medication (See eMAR), Repositioned (confirmed has re-started Xarelto  as instructed post ED visit 10/28/24)  Medications Reviewed Today     Reviewed by Matrice Herro M, RN (Registered Nurse) on 10/31/24 at 1436  Med List Status: <None>   Medication Order Taking? Sig Documenting Provider Last Dose Status Informant  acetaminophen  (TYLENOL ) 500 MG tablet [id number]  Take 500 mg by mouth daily as needed for  moderate pain (pain score 4-6) or mild pain (pain score 1-3).  Patient not taking: Reported on 10/12/2024   [provider]  Active Self, Pharmacy Records  ALPRAZolam  (XANAX ) 1 MG tablet [id number]  Take 1 tablet (1 mg total) by mouth at bedtime. Pickenpack-Cousar, Fannie SAILOR, NP  Active   Ascorbic Acid (VITAMIN C) 1000 MG tablet [id number]  Take 1,000 mg by mouth daily. [provider]  Active Self, Pharmacy Records  furosemide  (LASIX ) 20 MG tablet [id number]  Take 1 tablet (20 mg total) by mouth daily. Take extra dose as needed for weight gain of 3 LB in 1 day, 5 LB in 1 week Segal, Jared E, DO  Active   levothyroxine  (SYNTHROID ) 50 MCG tablet [id number]  TAKE 1 TABLET BY MOUTH ONCE DAILY BEFORE BREAKFAST APPOINTMENT  REQUIRED  FOR  FUTURE  REFILLS [name], MD  Active Self, Pharmacy Records  LUMIGAN 0.01 % SOLN [id number]  Place 1 drop into both eyes at bedtime.  [provider]  Active Self, Pharmacy Records           Med Note (COX, HEATHER C   Wed Apr 14, 2017  9:08 AM)    metoprolol  succinate (TOPROL -XL) 25 MG 24 hr tablet [id number]  Take 2 tablets (50 mg total) by mouth daily. Segal, Jared E, DO  Active   Multiple Vitamins-Minerals (PRESERVISION AREDS 2 PO) [id number]  Take 1 tablet by mouth daily. [provider]  Active Self, Pharmacy Records  Multiple Vitamins-Minerals (WOMENS 50+ MULTI VITAMIN/MIN PO) [id number]  Take 1 tablet by mouth daily. [provider]  Active Self, Pharmacy Records  Omega-3 Fatty Acids (FISH OIL TRIPLE STRENGTH) 1400 MG CAPS [id number]  Take 1,400 mg by mouth daily. [provider]  Active Self, Pharmacy Records  ondansetron  (ZOFRAN ) 8 MG tablet [id number]  Take 1 tablet (8 mg total) by mouth every 8 (eight) hours as needed for nausea or vomiting. Start on the third day after carboplatin . Sherrod Sherrod, MD  Active Self, Pharmacy Records  pantoprazole  (PROTONIX ) 40 MG tablet [id number]  Take 1 tablet (40 mg total) by mouth daily. TAKE 1 TABLET BY MOUTH ONCE DAILY  Patient taking differently: Take 40 mg by mouth daily.   [name],  MD  Active Self, Pharmacy Records  Perfluorohexyloctane [facility]) 1.338 GM/ML SOLN [id number]  Place 1.338 mLs into both eyes 4 (four) times daily. [provider]  Active Self, Pharmacy Records  potassium chloride  (KLOR-CON  M) 10 MEQ tablet [id number]  Take 2 tablets (20 mEq total) by mouth every other day. Fairy Frames, MD  Active   predniSONE  (DELTASONE ) 10 MG tablet [id number] Yes Take 4 tablets (40 mg total) by mouth daily for 5 days. Laurice Maude BROCKS, MD  Active   prochlorperazine  (COMPAZINE ) 10 MG tablet [id number]  Take 1 tablet (10 mg total) by mouth every 6 (six) hours as needed for nausea or vomiting. Sherrod Sherrod, MD  Active Self, Pharmacy Records  Rivaroxaban  (XARELTO ) 15 MG TABS tablet [id number] Yes Take 1 tablet (15 mg total) by mouth daily with supper. Fairy Frames, MD  Active   vitamin k 100 MCG tablet [id number]  Take 100 mcg by mouth daily. [provider]  Active Self, Pharmacy Records           Recommendation:   Specialty provider follow-up: IR for thoracentesis as needed - scheduled for 11/01/24, pending 10/31/24 CXR results; 11/06/24- oncology provider Continue Current Plan of Care  Follow Up Plan:   Telephone  follow-up in 1 week- as scheduled 11/07/24  I appreciate the opportunity to participate in Winona's care,  Pls call/ message for questions,  Ajeet Casasola Mckinney Romonia Yanik, RN, BSN, CCRN Alumnus RN Care Manager  Transitions of Care  [facility] - [facility] [phone]: direct office     "

## 2024-10-31 NOTE — Patient Instructions (Signed)
 Visit Information  Thank you for taking time to visit with me today. Please don't hesitate to contact me if I can be of assistance to you before our next scheduled telephone appointment.  Our next appointment is by telephone on Tuesday, November 07, 2024 at 3:00 pm  Please call the care guide team at [phone] if you need to cancel or reschedule your appointment.   Following are the goals we discussed today:  Patient Self Care Activities:  Attend all scheduled provider appointments Call pharmacy for medication refills 3-7 days in advance of running out of medications Call provider office for new concerns or questions  Participate in Transition of Care Program/Attend TOC scheduled calls Take medications as prescribed   Continue pacing activity to avoid episodes of shortness of breath Continue to follow your established action plan for episodes of shortness of breath Weigh yourself every day to stay on top of early fluid retention: write down your weights every day so you remember what it is from day to day: follow the weight-gain guidelines and action plan to call your doctor if you gain more than 3 lbs overnight, or 5 lbs in one week Use assistive devices (cane/ walker) as needed to prevent falls, especially if you are feeling dizzy If you believe your condition is getting worse- contact your care providers (doctors) promptly- reaching out to your doctor early when you have concerns can prevent you from having to go to the hospital Ambulatory drain care Drain daily, up to max of 1L until patient is only able to drain out . If <150ml  for 3 consecutive drains every other day, then call Interventional Radiology [phone]) for evaluation and possible removal.  If you are experiencing a Mental Health or Behavioral Health Crisis or need someone to talk to, please  call the Suicide and Crisis Lifeline: 988 call the [place]  National Suicide Prevention Lifeline: 3020960953 or TTY:  (406) 694-0086 TTY 623-632-5659) to talk to a trained counselor call 1-800-273-TALK (toll free, 24 hour hotline) go to [facility] [address] [place] [phone]) call the [facility] Crisis Line: [phone] call 911   Care plan and visit instructions communicated with the patient and her caregiver- daughter verbally today. Caregiver agrees to receive a copy in MyChart. Active MyChart status and patient understanding of how to access instructions and care plan via MyChart confirmed      Beatris Blinda Lawrence, RN, BSN, CCRN Alumnus RN Care Manager  Transitions of Care  [facility] - [facility] [phone]: direct office

## 2024-11-01 ENCOUNTER — Ambulatory Visit (HOSPITAL_COMMUNITY)
Admission: RE | Admit: 2024-11-01 | Discharge: 2024-11-01 | Disposition: A | Source: Ambulatory Visit | Attending: Student

## 2024-11-01 ENCOUNTER — Ambulatory Visit (HOSPITAL_COMMUNITY)
Admission: RE | Admit: 2024-11-01 | Discharge: 2024-11-01 | Disposition: A | Discharge status: Home / Self Care | Source: Ambulatory Visit | Attending: Physician Assistant | Admitting: Physician Assistant

## 2024-11-01 VITALS — BP 160/96

## 2024-11-01 DIAGNOSIS — R918 Other nonspecific abnormal finding of lung field: Secondary | ICD-10-CM | POA: Diagnosis not present

## 2024-11-01 DIAGNOSIS — J90 Pleural effusion, not elsewhere classified: Secondary | ICD-10-CM | POA: Diagnosis present

## 2024-11-01 DIAGNOSIS — J910 Malignant pleural effusion: Secondary | ICD-10-CM

## 2024-11-01 HISTORY — PX: IR THORACENTESIS ASP PLEURAL SPACE W/IMG GUIDE: IMG5380

## 2024-11-01 MED ORDER — LIDOCAINE-EPINEPHRINE 1 %-1:100000 IJ SOLN
20.0000 mL | Freq: Once | INTRAMUSCULAR | Status: AC
  Administered 2024-11-01: 10 mL via INTRADERMAL

## 2024-11-01 MED ORDER — LIDOCAINE-EPINEPHRINE 1 %-1:100000 IJ SOLN
INTRAMUSCULAR | Status: AC
  Filled 2024-11-01: qty 20

## 2024-11-01 NOTE — Procedures (Signed)
 PROCEDURE SUMMARY:  Successful US  guided right thoracentesis. Yielded 500 ml of clear yellow fluid. Pt tolerated procedure well. No immediate complications.  Specimen not sent for labs. CXR ordered; no post-procedure pneumothorax identified.   EBL < 2 mL  Warren JONELLE Dais, NP 11/01/2024 11:12 AM

## 2024-11-03 ENCOUNTER — Inpatient Hospital Stay

## 2024-11-06 ENCOUNTER — Inpatient Hospital Stay: Admitting: Nurse Practitioner

## 2024-11-06 ENCOUNTER — Inpatient Hospital Stay

## 2024-11-06 ENCOUNTER — Inpatient Hospital Stay: Admitting: Internal Medicine

## 2024-11-06 ENCOUNTER — Encounter: Payer: Self-pay | Admitting: Nurse Practitioner

## 2024-11-06 ENCOUNTER — Encounter: Payer: Self-pay | Admitting: Medical Oncology

## 2024-11-06 VITALS — BP 102/66 | HR 114 | Temp 98.7°F | Resp 17 | Ht 66.0 in | Wt 138.3 lb

## 2024-11-06 DIAGNOSIS — Z515 Encounter for palliative care: Secondary | ICD-10-CM | POA: Diagnosis not present

## 2024-11-06 DIAGNOSIS — I82409 Acute embolism and thrombosis of unspecified deep veins of unspecified lower extremity: Secondary | ICD-10-CM | POA: Diagnosis not present

## 2024-11-06 DIAGNOSIS — R530 Neoplastic (malignant) related fatigue: Secondary | ICD-10-CM | POA: Diagnosis not present

## 2024-11-06 DIAGNOSIS — C3411 Malignant neoplasm of upper lobe, right bronchus or lung: Secondary | ICD-10-CM

## 2024-11-06 DIAGNOSIS — M792 Neuralgia and neuritis, unspecified: Secondary | ICD-10-CM

## 2024-11-06 DIAGNOSIS — G4700 Insomnia, unspecified: Secondary | ICD-10-CM | POA: Diagnosis not present

## 2024-11-06 DIAGNOSIS — Z7901 Long term (current) use of anticoagulants: Secondary | ICD-10-CM

## 2024-11-06 DIAGNOSIS — M5430 Sciatica, unspecified side: Secondary | ICD-10-CM

## 2024-11-06 LAB — CMP (CANCER CENTER ONLY)
ALT: 36 U/L (ref 0–44)
AST: 14 U/L — ABNORMAL LOW (ref 15–41)
Albumin: 3.2 g/dL — ABNORMAL LOW (ref 3.5–5.0)
Alkaline Phosphatase: 176 U/L — ABNORMAL HIGH (ref 38–126)
Anion gap: 12 (ref 5–15)
BUN: 32 mg/dL — ABNORMAL HIGH (ref 8–23)
CO2: 26 mmol/L (ref 22–32)
Calcium: 9.1 mg/dL (ref 8.9–10.3)
Chloride: 100 mmol/L (ref 98–111)
Creatinine: 1.82 mg/dL — ABNORMAL HIGH (ref 0.44–1.00)
GFR, Estimated: 27 mL/min — ABNORMAL LOW
Glucose, Bld: 145 mg/dL — ABNORMAL HIGH (ref 70–99)
Potassium: 4.5 mmol/L (ref 3.5–5.1)
Sodium: 137 mmol/L (ref 135–145)
Total Bilirubin: 0.5 mg/dL (ref 0.0–1.2)
Total Protein: 6.5 g/dL (ref 6.5–8.1)

## 2024-11-06 LAB — CBC WITH DIFFERENTIAL (CANCER CENTER ONLY)
Abs Immature Granulocytes: 0.17 K/uL — ABNORMAL HIGH (ref 0.00–0.07)
Basophils Absolute: 0.1 K/uL (ref 0.0–0.1)
Basophils Relative: 1 %
Eosinophils Absolute: 0 K/uL (ref 0.0–0.5)
Eosinophils Relative: 0 %
HCT: 28.6 % — ABNORMAL LOW (ref 36.0–46.0)
Hemoglobin: 9.4 g/dL — ABNORMAL LOW (ref 12.0–15.0)
Immature Granulocytes: 1 %
Lymphocytes Relative: 5 %
Lymphs Abs: 0.7 K/uL (ref 0.7–4.0)
MCH: 28.7 pg (ref 26.0–34.0)
MCHC: 32.9 g/dL (ref 30.0–36.0)
MCV: 87.5 fL (ref 80.0–100.0)
Monocytes Absolute: 1.2 K/uL — ABNORMAL HIGH (ref 0.1–1.0)
Monocytes Relative: 9 %
Neutro Abs: 11.5 K/uL — ABNORMAL HIGH (ref 1.7–7.7)
Neutrophils Relative %: 84 %
Platelet Count: 281 K/uL (ref 150–400)
RBC: 3.27 MIL/uL — ABNORMAL LOW (ref 3.87–5.11)
RDW: 17.4 % — ABNORMAL HIGH (ref 11.5–15.5)
WBC Count: 13.6 K/uL — ABNORMAL HIGH (ref 4.0–10.5)
nRBC: 0 % (ref 0.0–0.2)

## 2024-11-06 LAB — SAMPLE TO BLOOD BANK

## 2024-11-06 MED ORDER — FAMOTIDINE IN NACL 20-0.9 MG/50ML-% IV SOLN
20.0000 mg | Freq: Once | INTRAVENOUS | Status: AC
  Administered 2024-11-06: 20 mg via INTRAVENOUS
  Filled 2024-11-06: qty 50

## 2024-11-06 MED ORDER — SODIUM CHLORIDE 0.9 % IV SOLN
192.4000 mg | Freq: Once | INTRAVENOUS | Status: AC
  Administered 2024-11-06: 190 mg via INTRAVENOUS
  Filled 2024-11-06: qty 19

## 2024-11-06 MED ORDER — APREPITANT 130 MG/18ML IV EMUL
130.0000 mg | Freq: Once | INTRAVENOUS | Status: AC
  Administered 2024-11-06: 130 mg via INTRAVENOUS
  Filled 2024-11-06: qty 18

## 2024-11-06 MED ORDER — PALONOSETRON HCL INJECTION 0.25 MG/5ML
0.2500 mg | Freq: Once | INTRAVENOUS | Status: AC
  Administered 2024-11-06: 0.25 mg via INTRAVENOUS
  Filled 2024-11-06: qty 5

## 2024-11-06 MED ORDER — SODIUM CHLORIDE 0.9 % IV SOLN: Freq: Once | INTRAVENOUS | Status: DC

## 2024-11-06 MED ORDER — DIPHENHYDRAMINE HCL 50 MG/ML IJ SOLN
25.0000 mg | Freq: Once | INTRAMUSCULAR | Status: AC
  Administered 2024-11-06: 25 mg via INTRAVENOUS
  Filled 2024-11-06: qty 1

## 2024-11-06 MED ORDER — DEXAMETHASONE SOD PHOSPHATE PF 10 MG/ML IJ SOLN
10.0000 mg | Freq: Once | INTRAMUSCULAR | Status: AC
  Administered 2024-11-06: 10 mg via INTRAVENOUS

## 2024-11-06 MED ORDER — SODIUM CHLORIDE 0.9 % IV SOLN: INTRAVENOUS | Status: AC

## 2024-11-06 MED ORDER — SODIUM CHLORIDE 0.9 % IV SOLN
200.0000 mg | Freq: Once | INTRAVENOUS | Status: AC
  Administered 2024-11-06: 200 mg via INTRAVENOUS
  Filled 2024-11-06: qty 200

## 2024-11-06 MED ORDER — SODIUM CHLORIDE 0.9 % IV SOLN
150.0000 mg/m2 | Freq: Once | INTRAVENOUS | Status: AC
  Administered 2024-11-06: 258 mg via INTRAVENOUS
  Filled 2024-11-06: qty 43

## 2024-11-06 MED ORDER — SODIUM CHLORIDE 0.9 % IV SOLN: INTRAVENOUS | Status: DC

## 2024-11-06 NOTE — Patient Instructions (Signed)
 [facility] - A DEPT OF [place]  Discharge Instructions: Thank you for choosing [facility] to provide your oncology and hematology care.   If you have a lab appointment with the Cancer Center, please go directly to the Cancer Center and check in at the registration area.   Wear comfortable clothing and clothing appropriate for easy access to any Portacath or PICC line.   We strive to give you quality time with your provider. You may need to reschedule your appointment if you arrive late (15 or more minutes).  Arriving late affects you and other patients whose appointments are after yours.  Also, if you miss three or more appointments without notifying the office, you may be dismissed from the clinic at the providers discretion.      For prescription refill requests, have your pharmacy contact our office and allow 72 hours for refills to be completed.    Today you received the following chemotherapy and/or immunotherapy agents: Carboplatin , Paclitaxel , Pembrolizumab       To help prevent nausea and vomiting after your treatment, we encourage you to take your nausea medication as directed.  BELOW ARE SYMPTOMS THAT SHOULD BE REPORTED IMMEDIATELY: *FEVER GREATER THAN 100.4 F (38 C) OR HIGHER *CHILLS OR SWEATING *NAUSEA AND VOMITING THAT IS NOT CONTROLLED WITH YOUR NAUSEA MEDICATION *UNUSUAL SHORTNESS OF BREATH *UNUSUAL BRUISING OR BLEEDING *URINARY PROBLEMS (pain or burning when urinating, or frequent urination) *BOWEL PROBLEMS (unusual diarrhea, constipation, pain near the anus) TENDERNESS IN MOUTH AND THROAT WITH OR WITHOUT PRESENCE OF ULCERS (sore throat, sores in mouth, or a toothache) UNUSUAL RASH, SWELLING OR PAIN  UNUSUAL VAGINAL DISCHARGE OR ITCHING   Items with * indicate a potential emergency and should be followed up as soon as possible or go to the Emergency Department if any problems should occur.  Please show the CHEMOTHERAPY  ALERT CARD or IMMUNOTHERAPY ALERT CARD at check-in to the Emergency Department and triage nurse.  Should you have questions after your visit or need to cancel or reschedule your appointment, please contact [facility] - A DEPT OF [facility]  Dept: [phone]  and follow the prompts.  Office hours are 8:00 a.m. to 4:30 p.m. Monday - Friday. Please note that voicemails left after 4:00 p.m. may not be returned until the following business day.  We are closed weekends and major holidays. You have access to a nurse at all times for urgent questions. Please call the main number to the clinic Dept: [phone] and follow the prompts.   For any non-urgent questions, you may also contact your provider using MyChart. We now offer e-Visits for anyone 85 and older to request care online for non-urgent symptoms. For details visit [email or web address].   Also download the MyChart app! Go to the app store, search MyChart, open the app, select [place], and log in with your MyChart username and password.

## 2024-11-06 NOTE — Progress Notes (Signed)
 "     [facility] [phone]   [phone]  OFFICE PROGRESS NOTE  Carolyn Wells ORN, MD [address] [place] [address]  DIAGNOSIS: Stage IV (T2b, N3, M1 a) non-small cell lung cancer, adenocarcinoma presented with right upper lobe lung mass in addition to right hilar, mediastinal and right supraclavicular lymphadenopathy as well as malignant right pleural effusion diagnosed in October 2025    Molecular studies by foundation 1 showed positive KRAS G12C mutation and PD-L1 expression of 40%.    PRIOR THERAPY: None   CURRENT THERAPY: Systemic chemotherapy with carboplatin  for an AUC of 5, paclitaxel , and Keytruda  IV every 3 weeks.  First dose on 10/16/2024.  Status post 1 cycle.  INTERVAL HISTORY: Carolyn Wells 83 y.o. female returns to the clinic today for follow-up visit. Discussed the use of AI scribe software for clinical note transcription with the patient, who gave verbal consent to proceed.  History of Present Illness Carolyn Wells is an 82 year old female with stage IV KRAS G12C-mutated non-small cell lung adenocarcinoma receiving carboplatin , paclitaxel , and pembrolizumab  who presents for evaluation of persistent fatigue and cognitive symptoms prior to cycle 2 of chemotherapy.  She was diagnosed with stage IV non-small cell lung adenocarcinoma in October 2025, with molecular studies positive for KRAS G12C mutation and PD-L1 expression of 40%. She is not a candidate for pemetrexed due to chronic renal insufficiency. She has completed one cycle of carboplatin , paclitaxel , and pembrolizumab , and is here for assessment prior to cycle two.  Since initiation of chemotherapy, she has experienced persistent and severe fatigue, dizziness, and cognitive impairment described as feeling 'loopy' and 'foggy' for the past three weeks. These symptoms have significantly impaired her functional status, limiting her ability to stand for prolonged periods and  necessitating frequent rest. She reports increased somnolence, reduced activity, and reliance on bed exercises. Oral intake is limited to approximately one bottle of water per day. Her daughter corroborates the severity of her fatigue and cognitive symptoms.  She has chronic anemia, with hemoglobin most recently 9.4 g/dL (previous nadir 8.3 g/dL), and chronic kidney disease. 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 She was hospitalized one week ago for left leg pain.  She reports persistent cough and has a history of pleural effusion requiring drainage. Cough remains ongoing.  Her cardiac medications were recently adjusted, including discontinuation of antihypertensive therapy due to hypotension and initiation of extended-release metoprolol . She also takes furosemide , potassium, and uses alprazolam  nightly for sleep, which she started after onset of cognitive symptoms, and is unable to sleep without it.     MEDICAL HISTORY: Past Medical History:  Diagnosis Date   ABDOMINAL INCISIONAL HERNIA 08/13/2009   s/p repair   Allergic rhinitis, cause unspecified 09/28/2011   BRADYCARDIA 09/26/2010   CKD (chronic kidney disease), stage III (HCC) 02/26/2015   Solitary kidney   COLONIC POLYPS, HX OF 08/25/2007   Complication of anesthesia    DIVERTICULOSIS, COLON 08/25/2007   Dysrhythmia    GASTROESOPHAGEAL REFLUX DISEASE 08/04/2007   HYPERLIPIDEMIA 09/27/2010   not on Rx therapy   HYPERTENSION 08/06/2007   Hypothyroidism    INTERNAL HEMORRHOIDS 08/04/2007   LOW BACK PAIN 08/06/2007   scoliosis; DJD; gets injections   Lung cancer (HCC)    OSTEOPENIA 08/04/2007   PAF (paroxysmal atrial fibrillation) (HCC)    a.  Echo 2/17:  Vigorous LVF, EF 65-70%, no RWMA, Ao sclerosis, trivial AI, trivial  MR, trivial TR, PASP 23 mmHg   PONV (postoperative nausea and vomiting)    Solitary kidney, acquired 09/28/2011    donated R kidney to husband    ALLERGIES:  is allergic to budesonide, elavil  [amitriptyline ], gabapentin , tramadol , hydrocodone, and ipratropium.  MEDICATIONS:  Current Outpatient Medications  Medication Sig Dispense Refill   acetaminophen  (TYLENOL ) 500 MG tablet Take 500 mg by mouth daily as needed for moderate pain (pain score 4-6) or mild pain (pain score 1-3). (Patient not taking: Reported on 10/12/2024)     ALPRAZolam  (XANAX ) 1 MG tablet Take 1 tablet (1 mg total) by mouth at bedtime. 30 tablet 1   Ascorbic Acid (VITAMIN C) 1000 MG tablet Take 1,000 mg by mouth daily.     furosemide  (LASIX ) 20 MG tablet Take 1 tablet (20 mg total) by mouth daily. Take extra dose as needed for weight gain of 3 LB in 1 day, 5 LB in 1 week 60 tablet 0   levothyroxine  (SYNTHROID ) 50 MCG tablet TAKE 1 TABLET BY MOUTH ONCE DAILY BEFORE BREAKFAST APPOINTMENT  REQUIRED  FOR  FUTURE  REFILLS 90 tablet 0   LUMIGAN 0.01 % SOLN Place 1 drop into both eyes at bedtime.      metoprolol  succinate (TOPROL -XL) 25 MG 24 hr tablet Take 2 tablets (50 mg total) by mouth daily. 90 tablet 0   Multiple Vitamins-Minerals (PRESERVISION AREDS 2 PO) Take 1 tablet by mouth daily.     Multiple Vitamins-Minerals (WOMENS 50+ MULTI VITAMIN/MIN PO) Take 1 tablet by mouth daily.     Omega-3 Fatty Acids (FISH OIL TRIPLE STRENGTH) 1400 MG CAPS Take 1,400 mg by mouth daily.     ondansetron  (ZOFRAN ) 8 MG tablet Take 1 tablet (8 mg total) by mouth every 8 (eight) hours as needed for nausea or vomiting. Start on the third day after carboplatin . 30 tablet 1   pantoprazole  (PROTONIX ) 40 MG tablet Take 1 tablet (40 mg total) by mouth daily. TAKE 1 TABLET BY MOUTH ONCE DAILY (Patient taking differently: Take 40 mg by mouth daily.) 90 tablet 3   Perfluorohexyloctane (MIEBO) 1.338 GM/ML SOLN Place 1.338 mLs into both eyes 4 (four) times daily.     potassium chloride  (KLOR-CON  M) 10 MEQ tablet Take 2 tablets (20 mEq total) by mouth every other day. 60  tablet 0   prochlorperazine  (COMPAZINE ) 10 MG tablet Take 1 tablet (10 mg total) by mouth every 6 (six) hours as needed for nausea or vomiting. 30 tablet 1   [MAR Hold] Rivaroxaban  (XARELTO ) 15 MG TABS tablet Take 1 tablet (15 mg total) by mouth daily with supper.     vitamin k 100 MCG tablet Take 100 mcg by mouth daily.     No current facility-administered medications for this visit.    SURGICAL HISTORY:  Past Surgical History:  Procedure Laterality Date   CARDIOVERSION [name] 08/30/2024   Procedure: CARDIOVERSION;  Surgeon: Francyne Headland, MD;  Location: [facility];  Service: Cardiovascular;  Laterality: [name];   IR IMAGING GUIDED PORT INSERTION  10/09/2024   IR PERC PLEURAL DRAIN W/INDWELL CATH W/IMG GUIDE  10/04/2024   IR THORACENTESIS ASP PLEURAL SPACE W/IMG GUIDE  09/06/2024   IR THORACENTESIS ASP PLEURAL SPACE W/IMG GUIDE  09/14/2024   IR THORACENTESIS ASP PLEURAL SPACE W/IMG GUIDE  09/28/2024   IR THORACENTESIS ASP PLEURAL SPACE W/IMG GUIDE  11/01/2024   LAMINECTOMY  1999   L4-5/lumbar   NEPHRECTOMY     Right   s/o incisional hernia repair  RUQ   STRABISMUS SURGERY Bilateral 04/14/2019   Procedure: STRABISMUS REPAIR BILATERAL;  Surgeon: Neysa Fallow, MD;  Location: [facility];  Service: Ophthalmology;  Laterality: Bilateral;   TONSILLECTOMY      REVIEW OF SYSTEMS:  Constitutional: positive for anorexia, fatigue, and weight loss Eyes: negative Ears, nose, mouth, throat, and face: negative Respiratory: positive for cough Cardiovascular: negative Gastrointestinal: negative Genitourinary:negative Integument/breast: negative Hematologic/lymphatic: negative Musculoskeletal:positive for muscle weakness Neurological: negative Behavioral/Psych: negative Endocrine: negative Allergic/Immunologic: negative   PHYSICAL EXAMINATION: General appearance: alert, cooperative, fatigued, and no distress Head: Normocephalic, without obvious abnormality,  atraumatic Neck: no adenopathy, no JVD, supple, symmetrical, trachea midline, and thyroid  not enlarged, symmetric, no tenderness/mass/nodules Lymph nodes: Cervical, supraclavicular, and axillary nodes normal. Resp: clear to auscultation bilaterally Back: symmetric, no curvature. ROM normal. No CVA tenderness. Cardio: regular rate and rhythm, S1, S2 normal, no murmur, click, rub or gallop GI: soft, non-tender; bowel sounds normal; no masses,  no organomegaly Extremities: extremities normal, atraumatic, no cyanosis or edema Neurologic: Alert and oriented X 3, normal strength and tone. Normal symmetric reflexes. Normal coordination and gait  ECOG PERFORMANCE STATUS: 1 - Symptomatic but completely ambulatory  Blood pressure 102/66, pulse (!) 114, temperature 98.7 F (37.1 C), temperature source Temporal, resp. rate 17, height 5' 6 (1.676 m), weight 138 lb 4.8 oz (62.7 kg), SpO2 97%.  LABORATORY DATA: Lab Results  Component Value Date   WBC 13.6 (H) 11/06/2024   HGB 9.4 (L) 11/06/2024   HCT 28.6 (L) 11/06/2024   MCV 87.5 11/06/2024   PLT 281 11/06/2024      Chemistry      Component Value Date/Time   NA 143 10/31/2024 1053   NA 141 02/02/2023 1406   K 4.5 10/31/2024 1053   CL 107 10/31/2024 1053   CO2 24 10/31/2024 1053   BUN 40 (H) 10/31/2024 1053   BUN 28 (H) 02/02/2023 1406   CREATININE 1.80 (H) 10/31/2024 1053   CREATININE 1.36 (H) 02/07/2016 0942      Component Value Date/Time   CALCIUM  9.3 10/31/2024 1053   ALKPHOS 253 (H) 10/31/2024 1053   AST 30 10/31/2024 1053   ALT 41 10/31/2024 1053   BILITOT <0.2 10/31/2024 1053       RADIOGRAPHIC STUDIES: IR THORACENTESIS ASP PLEURAL SPACE W/IMG GUIDE Result Date: 11/01/2024 INDICATION: fluid Patient with a history of right lung cancer with recurrent right pleural effusion. Interventional Radiology asked to perform a therapeutic thoracentesis. EXAM: ULTRASOUND GUIDED THORACENTESIS MEDICATIONS: 1% lidocaine  10 mL  COMPLICATIONS: None immediate. PROCEDURE: An ultrasound guided thoracentesis was thoroughly discussed with the patient and questions answered. The benefits, risks, alternatives and complications were also discussed. The patient understands and wishes to proceed with the procedure. Written consent was obtained. Ultrasound was performed to localize and mark an adequate pocket of fluid in the right chest. The area was then prepped and draped in the normal sterile fashion. 1% Lidocaine  was used for local anesthesia. Under ultrasound guidance a 6 Fr Safe-T-Centesis catheter was introduced. Thoracentesis was performed. The catheter was removed and a dressing applied. FINDINGS: A total of approximately 500 mL of clear yellow fluid was removed. IMPRESSION: Successful ultrasound guided RIGHT thoracentesis yielding 500 mL of pleural fluid. Procedure performed by: Warren Dais, NP under supervision of Thom Hall, MD Electronically Signed   By: Thom Hall M.D.   On: 11/01/2024 11:31   DG Chest 1 View Result Date: 11/01/2024 CLINICAL DATA:  evaluate for fluid on lung EXAM: PORTABLE CHEST 1  VIEW COMPARISON:  Chest XR, 10/07/2024.  CT chest, 10/05/2024. FINDINGS: Support lines; RIGHT chest port, well-positioned with catheter tip at the superior cavoatrial junction. Cardiac silhouette is within normal limits. No RIGHT superior mediastinal mass, unchanged. The LEFT lung is relatively well inflated, with a small persistent hazy retrocardiac opacity. Small volume RIGHT pleural effusion, with dependent basilar consolidation silhouetting the hemidiaphragm. No pneumothorax. Surgical clips projecting at the epigastrium and RIGHT upper quadrant. No acute osseous abnormality. IMPRESSION: 1. Small volume RIGHT pleural effusion. 2. RIGHT basilar dependent consolidation is likely to represent atelectasis, however superimposed pneumonia can appear similar. Electronically Signed   By: Thom Hall M.D.   On: 11/01/2024 10:21   DG  Chest 1 View Result Date: 11/01/2024 CLINICAL DATA:  [id number] S/P thoracentesis [id number]. RIGHT thoracentesis. Prior RIGHT PleurX catheter, placed 10/04/2024 EXAM: PORTABLE CHEST 1 VIEW COMPARISON:  Chest XR, 10/31/2024. IR thoracentesis, earlier same day. FINDINGS: Support lines; RIGHT chest port, well-positioned with catheter tip at the superior cavoatrial junction. Cardiac silhouette is within normal limits. Known RIGHT superior mediastinal mass, unchanged. The LEFT lung is well inflated, with a small persistent retrocardiac hazy opacity. Improved aeration of the RIGHT lung post thoracentesis without residual pleural effusion. No pneumothorax. No interval osseous abnormality. IMPRESSION: 1. Improved aeration of the RIGHT lung post thoracentesis, without residual pleural effusion. No pneumothorax. 2. Known RIGHT superior mediastinal/apical lung mass, unchanged. Electronically Signed   By: Thom Hall M.D.   On: 11/01/2024 09:46   VAS US  LOWER EXTREMITY VENOUS (DVT) (7a-5p) Result Date: 10/29/2024  Lower Venous DVT Study Patient Name:  Carolyn Wells  Date of Exam:   10/28/2024 Medical Rec #: [id number]          Accession #:    [id number] Date of Birth: [birth year]          Patient Gender: F Patient Age:   79 years Exam Location:  [facility] Procedure:      VAS US  LOWER EXTREMITY VENOUS (DVT) Referring Phys: MAUDE MESSICK --------------------------------------------------------------------------------  Indications: Pain.  Risk Factors: Cancer: recently diagnosed metastatic adeno CA lung with recurrent malignant effusions. Anticoagulation: Xarelto . Comparison Study: No prior left LEV on file Performing Technologist: Alberta Lis RVS  Examination Guidelines: A complete evaluation includes B-mode imaging, spectral Doppler, color Doppler, and power Doppler as needed of all accessible portions of each vessel. Bilateral testing is considered an integral part of a complete examination. Limited examinations  for reoccurring indications may be performed as noted. The reflux portion of the exam is performed with the patient in reverse Trendelenburg.  +-----+---------------+---------+-----------+----------+--------------+ RIGHTCompressibilityPhasicitySpontaneityPropertiesThrombus Aging +-----+---------------+---------+-----------+----------+--------------+ CFV  Full                                                        +-----+---------------+---------+-----------+----------+--------------+ SFJ  Full                                                        +-----+---------------+---------+-----------+----------+--------------+ GSV  None                                                        +-----+---------------+---------+-----------+----------+--------------+   +---------+---------------+---------+-----------+----------+-----------------+  LEFT     CompressibilityPhasicitySpontaneityPropertiesThrombus Aging    +---------+---------------+---------+-----------+----------+-----------------+ CFV      Full           Yes      No                                     +---------+---------------+---------+-----------+----------+-----------------+ SFJ      Full                                                           +---------+---------------+---------+-----------+----------+-----------------+ FV Prox  Full                                                           +---------+---------------+---------+-----------+----------+-----------------+ FV Mid   Full                                                           +---------+---------------+---------+-----------+----------+-----------------+ FV DistalFull                                                           +---------+---------------+---------+-----------+----------+-----------------+ PFV      Full                                                            +---------+---------------+---------+-----------+----------+-----------------+ POP      None           No       No                   Acute             +---------+---------------+---------+-----------+----------+-----------------+ PTV      None                                         Acute             +---------+---------------+---------+-----------+----------+-----------------+ PERO     None                                         Acute             +---------+---------------+---------+-----------+----------+-----------------+ Soleal   None           No       No  Acute             +---------+---------------+---------+-----------+----------+-----------------+ Gastroc  Full                                                           +---------+---------------+---------+-----------+----------+-----------------+ GSV      None                                         Age Indeterminate +---------+---------------+---------+-----------+----------+-----------------+ SSV      Full                                                           +---------+---------------+---------+-----------+----------+-----------------+ VV                                                    Age Indeterminate +---------+---------------+---------+-----------+----------+-----------------+     Summary: RIGHT: - No evidence of common femoral vein obstruction.   LEFT: - Findings consistent with acute deep vein thrombosis involving the left popliteal vein, left posterior tibial veins, and left peroneal veins.  - Findings consistent with age indeterminate superficial vein thrombosis involving the left great saphenous vein, and left superficial veins/varicosities.   *See table(s) above for measurements and observations. Electronically signed by Fonda Rim on 10/29/2024 at 12:15:56 PM.    Final    MR LUMBAR SPINE WO CONTRAST Result Date: 10/28/2024 CLINICAL DATA:  Initial evaluation  for low back pain, cancer suspected. EXAM: MRI LUMBAR SPINE WITHOUT CONTRAST TECHNIQUE: Multiplanar, multisequence MR imaging of the lumbar spine was performed. No intravenous contrast was administered. COMPARISON:  Prior radiograph from earlier the same day. FINDINGS: Segmentation: Standard segmentation. Lowest well-formed disc space labeled the L5-S1 level. Alignment: Moderate levoscoliosis with associated rotary component. Mild stepwise degenerative retrolisthesis of L1 on L2 through L3 on L4, with additional trace anterolisthesis of L5 on S1. Findings chronic and facet mediated. Vertebrae: Vertebral body height maintained without acute or chronic fracture. Bone marrow signal intensity diffusely heterogeneous. No worrisome osseous lesions or evidence for metastatic disease. Mild degenerative reactive endplate changes present about the L3-4 interspace. No other abnormal marrow edema. Conus medullaris and cauda equina: Conus extends to the L1 level. Conus and cauda equina appear normal. Paraspinal and other soft tissues: Paraspinous soft tissues demonstrate no acute finding. The right kidney appears to be absent. Compensatory hypertrophy of the left kidney. 2.6 cm simple T2 hyperintense left renal cyst, benign in appearance, no follow-up recommended. Disc levels: T12-L1: Disc desiccation with mild disc bulge. Superimposed small right paracentral disc extrusion with inferior migration. Mild facet hypertrophy. No significant spinal stenosis. Foramina remain patent. L1-2: Degenerative intervertebral disc space narrowing with diffuse disc bulge, asymmetric to the right. Reactive endplate spurring. Superimposed right foraminal disc extrusion with superior migration (series 5, image 8). Either the right L1 or descending L2 nerve roots could be affected. Mild to moderate right greater left facet hypertrophy. Resultant  moderate narrowing of the right lateral recess. Central canal remains patent. Foramina themselves remain  patent as well. L2-3: Advanced degenerative vertebral disc space narrowing with disc desiccation diffuse disc bulge. Reactive endplate spurring. Mild to moderate bilateral facet arthrosis. Resultant mild narrowing of the right lateral recess. Central canal remains patent. No significant foraminal stenosis. L3-4: Degenerative intervertebral disc space narrowing with diffuse disc bulge and disc desiccation. Reactive endplate spurring, greater on the right. Moderate facet hypertrophy, also greater on the right. Resultant mild canal with moderate bilateral subarticular stenosis. Mild right L3 foraminal narrowing. Left neural foramen remains patent. L4-5: Degenerative intervertebral disc space narrowing with disc desiccation. Mild reactive endplate spurring. Bilateral facet hypertrophy. Sequelae of prior left hemi laminectomy. No significant residual spinal stenosis. Mild to moderate left L4 foraminal narrowing. Right neural foramen is patent. L5-S1: Minimal disc bulge with disc desiccation. Advanced left-sided facet arthrosis. No significant spinal stenosis. Foramina remain patent. IMPRESSION: 1. No evidence for metastatic disease within the lumbar spine. 2. Right foraminal disc extrusion with superior migration at L1-2. Either the right L1 or descending L2 nerve roots could be affected. 3. Multifactorial degenerative changes at L3-4 with resultant mild canal and moderate bilateral subarticular stenosis, with mild right L3 foraminal narrowing. 4. Sequelae of prior left hemi laminectomy at L4-5 without residual spinal stenosis. Mild to moderate left L4 foraminal narrowing at this level. Electronically Signed   By: Morene Hoard M.D.   On: 10/28/2024 20:09   DG Hip Unilat W or Wo Pelvis 2-3 Views Left Result Date: 10/28/2024 EXAM: 2 VIEW(S) XRAY OF THE UNILATERAL HIP 10/28/2024 12:57:00 PM COMPARISON: None available. CLINICAL HISTORY: pain FINDINGS: BONES AND JOINTS: No acute fracture, pelvic bone diastasis, or  dislocation. No malalignment. Osteopenia. Mild bilateral hip osteoarthritis. SOFT TISSUES: Surgical clips overlying the right hemipelvis. Peripheral vascular atherosclerosis. LUMBAR SPINE: Degenerative changes of the visualized lower lumbar spine. IMPRESSION: 1. Osteopenia. No acute fracture, pelvic bone diastasis, or dislocation. 2. Mild bilateral hip osteoarthritis. Electronically signed by: Rogelia Myers MD 10/28/2024 01:06 PM EST RP Workstation: [id number]   DG Lumbar Spine Complete Result Date: 10/28/2024 EXAM: 4 VIEW(S) XRAY OF THE LUMBAR SPINE 10/28/2024 12:57:00 PM COMPARISON: 03/16/2024 and 12/10/2020. CLINICAL HISTORY: pain pain FINDINGS: LUMBAR SPINE: BONES: Vertebral body heights are maintained. Diffuse osteopenia. Moderate levocurvature of the lumbar spine, unchanged. DISCS AND DEGENERATIVE CHANGES: Advanced multilevel intervertebral disc height loss with large osteophyte formation throughout the lumbar spine. SOFT TISSUES: Diffuse aortoiliac atherosclerosis. IMPRESSION: 1. Diffuse osteopenia. No acute fracture or malalignment of the lumbar spine. 2. Moderate levocurvature of the lumbar spine with advanced multilevel degenerative disc disease, unchanged. Electronically signed by: Rogelia Myers MD 10/28/2024 01:05 PM EST RP Workstation: [id number]   IR US  CHEST Result Date: 10/12/2024 INDICATION: 83 year old with lung cancer and recurrent right pleural effusion. Recently underwent pleur-x drain placement 10/04/24 with 1.7 L removed at placement; however, the drain was removed per patient request for discomfort related to drain. Therapeutic thoracentesis requested today for increased SOB noted by outpatient provider. EXAM: CHEST ULTRASOUND COMPARISON:  None Available. FINDINGS: Minimal right-sided pleural effusion. No significant pocket of fluid or percutaneous window to allow safe thoracentesis. Risks outweigh the benefits. IMPRESSION: Small right-sided pleural effusion with no safe window for  percutaneous access Performed by Laymon Coast, NP Electronically Signed   By: Ester Sides M.D.   On: 10/12/2024 15:08   IR IMAGING GUIDED PORT INSERTION Result Date: 10/09/2024 INDICATION: 83 year old female with history of advanced stage lung cancer requiring central venous access for  chemotherapy administration. EXAM: IMPLANTED PORT A CATH PLACEMENT WITH ULTRASOUND AND FLUOROSCOPIC GUIDANCE COMPARISON:  None Available. MEDICATIONS: None. ANESTHESIA/SEDATION: Moderate (conscious) sedation was employed during this procedure. A total of Versed  1.5 mg and Fentanyl  75 mcg was administered intravenously. Moderate Sedation Time: 10 minutes. The patient's level of consciousness and vital signs were monitored continuously by radiology nursing throughout the procedure under my direct supervision. CONTRAST:  None FLUOROSCOPY TIME:  One mGy reference air kerma COMPLICATIONS: None immediate. PROCEDURE: The procedure, risks, benefits, and alternatives were explained to the patient. Questions regarding the procedure were encouraged and answered. The patient understands and consents to the procedure. The right neck and chest were prepped with chlorhexidine in a sterile fashion, and a sterile drape was applied covering the operative field. Maximum barrier sterile technique with sterile gowns and gloves were used for the procedure. A timeout was performed prior to the initiation of the procedure. Ultrasound was used to examine the jugular vein which was compressible and free of internal echoes. A skin marker was used to demarcate the planned venotomy and port pocket incision sites. Local anesthesia was provided to these sites and the subcutaneous tunnel track with 1% lidocaine  with 1:100,000 epinephrine . A small incision was created at the jugular access site and blunt dissection was performed of the subcutaneous tissues. Under ultrasound guidance, the jugular vein was accessed with a 21 ga micropuncture needle and an  0.018 wire was inserted to the superior vena cava. Wells-time ultrasound guidance was utilized for vascular access including the acquisition of a permanent ultrasound image documenting patency of the accessed vessel. A 5 Fr micopuncture set was then used, through which a 0.035 Rosen wire was passed under fluoroscopic guidance into the inferior vena cava. An 8 Fr dilator was then placed over the wire. A subcutaneous port pocket was then created along the upper chest wall utilizing a combination of sharp and blunt dissection. The pocket was irrigated with sterile saline, packed with gauze, and observed for hemorrhage. A single lumen clear view power injectable port was chosen for placement. The 8 Fr catheter was tunneled from the port pocket site to the venotomy incision. The port was placed in the pocket. The external catheter was trimmed to appropriate length. The dilator was exchanged for an 8 Fr peel-away sheath under fluoroscopic guidance. The catheter was then placed through the sheath and the sheath was removed. Final catheter positioning was confirmed and documented with a fluoroscopic spot radiograph. The port was accessed with a Huber needle, aspirated, and flushed with heparinized saline. The deep dermal layer of the port pocket incision was closed with interrupted 3-0 Vicryl suture. Dermabond was then placed over the port pocket and neck incisions. The patient tolerated the procedure well without immediate post procedural complication. FINDINGS: After catheter placement, the tip lies within the superior cavoatrial junction. The catheter aspirates and flushes normally and is ready for immediate use. IMPRESSION: Successful placement of a power injectable Port-A-Cath via the right internal jugular vein. The catheter is ready for immediate use. Ester Sides, MD [facility] Electronically Signed   By: Ester Sides M.D.   On: 10/09/2024 13:03     ASSESSMENT AND PLAN: This is a very pleasant 83 years old white female with Stage IV (T2b, N3, M1 a) non-small cell lung cancer, adenocarcinoma presented with right upper lobe lung mass in addition to right hilar, mediastinal and right supraclavicular lymphadenopathy as well as malignant right pleural effusion diagnosed in October 2025  Molecular studies by foundation 1 showed positive KRAS G12C mutation and PD-L1 expression of 40%.  She is currently undergoing palliative systemic chemotherapy with reduced dose carboplatin  for AUC of 5, paclitaxel  175 MGs/M2 and Keytruda  200 mg IV every 3 weeks with Neulasta  support.  She is status post 1 cycle.  She has a rough time tolerating her treatment.  The dose of carboplatin  will be reduced to AUC of 4 and paclitaxel  150 mg/M2 starting from cycle #2.  The patient was not a good candidate for treatment with pemetrexed because of her chronic renal insufficiency. Assessment and Plan Assessment & Plan Stage IV non-small cell lung adenocarcinoma with KRAS G12C mutation and PD-L1 expression She has advanced non-small cell lung adenocarcinoma with KRAS G12C mutation and PD-L1 expression, currently receiving carboplatin , paclitaxel , and pembrolizumab . She reports significant fatigue, dizziness, and cognitive impairment, likely multifactorial from chemotherapy toxicity, anemia, and age, resulting in decreased functional status. - Reduced carboplatin  and paclitaxel  doses for cycle two; maintained full-dose pembrolizumab . - Administered normal saline infusion during treatment for hydration. - Advised to maintain activity as tolerated to address fatigue. - Scheduled imaging after cycle three to evaluate disease response. - Discussed option to discontinue cytotoxic chemotherapy and continue immune checkpoint inhibitor monotherapy if toxicity persists or worsens.  Chronic kidney disease Chronic renal insufficiency restricts chemotherapy options and increases risk of  treatment-related toxicity. She is not eligible for pemetrexed due to renal function; chemotherapy dosing is adjusted accordingly. - Continued to avoid pemetrexed. - Monitored renal function and adjusted chemotherapy dosing as indicated.  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 Hemoglobin is 9.4 g/dL, previously as low as 8.3 g/dL. Anemia contributes to her fatigue. - Monitored hemoglobin and hematologic parameters. - Provided anticipatory guidance regarding fatigue and encouraged activity as tolerated.  Venous thromboembolism, on anticoagulation Anticoagulation was briefly interrupted during prior hospitalization but has been resumed. Recent leg pain was attributed to sciatica, not recurrent thrombosis. - Continued rivaroxaban . - Deferred repeat ultrasound; planned clinical monitoring. She was advised to call immediately if she has any concerning symptoms in the interval. The patient voices understanding of current disease status and treatment options and is in agreement with the current care plan.  All questions were answered. The patient knows to call the clinic with any problems, questions or concerns. We can certainly see the patient much sooner if necessary. The total time spent in the appointment was 30 minutes including review of chart and various tests results, discussions about plan of care and coordination of care plan .   Disclaimer: This note was dictated with voice recognition software. Similar sounding words can inadvertently be transcribed and may not be corrected upon review.        "

## 2024-11-06 NOTE — Progress Notes (Signed)
 "    Palliative Medicine [facility]  [phone] [phone]) [phone]   Name: [name] Date: 11/06/2024 MRN: [id number]  DOB: 09-21-1941  Patient Care Team: [name], MD as PCP - General (Internal Medicine) Kriste Emeline BRAVO, DO as PCP - Cardiology (Cardiology) Abran Norleen SAILOR, MD as Consulting Physician (Gastroenterology) Prentis Duwaine BROCKS, RN as Oncology Nurse Navigator Abelino Beatris CHRISTELLA, RN as VBCI Care Management Pickenpack-Cousar, Fannie SAILOR, NP as Nurse Practitioner (Hospice and Palliative Medicine)    INTERVAL HISTORY: Carolyn Wells is a 83 y.o. female with oncologic medical history including recently diagnosed lung cancer (08/2024) with mediastinal, right hilar, right supraclavicular, and right axillary adenopathy consistent with metastatic disease. Palliative is seeing patient for symptom management and goals of care.   SOCIAL HISTORY:     reports that she has never smoked. She has never been exposed to tobacco smoke. She has never used smokeless tobacco. She reports that she does not drink alcohol and does not use drugs.  ADVANCE DIRECTIVES:  Patient has a documented directive. Her daughter is her clinical research associate. No life sustaining treatment including artificial feedings.   CODE STATUS: DNR  PAST MEDICAL HISTORY: Past Medical History:  Diagnosis Date   ABDOMINAL INCISIONAL HERNIA 08/13/2009   s/p repair   Allergic rhinitis, cause unspecified 09/28/2011   BRADYCARDIA 09/26/2010   CKD (chronic kidney disease), stage III (HCC) 02/26/2015   Solitary kidney   COLONIC POLYPS, HX OF 08/25/2007   Complication of anesthesia    DIVERTICULOSIS, COLON 08/25/2007   Dysrhythmia    GASTROESOPHAGEAL REFLUX DISEASE 08/04/2007   HYPERLIPIDEMIA 09/27/2010   not on Rx therapy   HYPERTENSION 08/06/2007   Hypothyroidism    INTERNAL HEMORRHOIDS 08/04/2007   LOW BACK PAIN 08/06/2007   scoliosis; DJD; gets injections   Lung cancer (HCC)     OSTEOPENIA 08/04/2007   PAF (paroxysmal atrial fibrillation) (HCC)    a.  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    ALLERGIES:  is allergic to budesonide, elavil  [amitriptyline ], gabapentin , tramadol , hydrocodone, and ipratropium.  MEDICATIONS:  Current Outpatient Medications  Medication Sig Dispense Refill   acetaminophen  (TYLENOL ) 500 MG tablet Take 500 mg by mouth daily as needed for moderate pain (pain score 4-6) or mild pain (pain score 1-3). (Patient not taking: Reported on 10/12/2024)     ALPRAZolam  (XANAX ) 1 MG tablet Take 1 tablet (1 mg total) by mouth at bedtime. 30 tablet 1   Ascorbic Acid (VITAMIN C) 1000 MG tablet Take 1,000 mg by mouth daily.     furosemide  (LASIX ) 20 MG tablet Take 1 tablet (20 mg total) by mouth daily. Take extra dose as needed for weight gain of 3 LB in 1 day, 5 LB in 1 week 60 tablet 0   levothyroxine  (SYNTHROID ) 50 MCG tablet TAKE 1 TABLET BY MOUTH ONCE DAILY BEFORE BREAKFAST APPOINTMENT  REQUIRED  FOR  FUTURE  REFILLS 90 tablet 0   LUMIGAN 0.01 % SOLN Place 1 drop into both eyes at bedtime.      metoprolol  succinate (TOPROL -XL) 25 MG 24 hr tablet Take 2 tablets (50 mg total) by mouth daily. 90 tablet 0   Multiple Vitamins-Minerals (PRESERVISION AREDS 2 PO) Take 1 tablet by mouth daily.     Multiple Vitamins-Minerals (WOMENS  50+ MULTI VITAMIN/MIN PO) Take 1 tablet by mouth daily.     Omega-3 Fatty Acids (FISH OIL TRIPLE STRENGTH) 1400 MG CAPS Take 1,400 mg by mouth daily.     ondansetron  (ZOFRAN ) 8 MG tablet Take 1 tablet (8 mg total) by mouth every 8 (eight) hours as needed for nausea or vomiting. Start on the third day after carboplatin . 30 tablet 1   pantoprazole  (PROTONIX ) 40 MG tablet Take 1 tablet (40 mg total) by mouth daily. TAKE 1 TABLET BY MOUTH ONCE DAILY (Patient  taking differently: Take 40 mg by mouth daily.) 90 tablet 3   Perfluorohexyloctane (MIEBO) 1.338 GM/ML SOLN Place 1.338 mLs into both eyes 4 (four) times daily.     potassium chloride  (KLOR-CON  M) 10 MEQ tablet Take 2 tablets (20 mEq total) by mouth every other day. 60 tablet 0   prochlorperazine  (COMPAZINE ) 10 MG tablet Take 1 tablet (10 mg total) by mouth every 6 (six) hours as needed for nausea or vomiting. 30 tablet 1   [MAR Hold] Rivaroxaban  (XARELTO ) 15 MG TABS tablet Take 1 tablet (15 mg total) by mouth daily with supper.     vitamin k 100 MCG tablet Take 100 mcg by mouth daily.     No current facility-administered medications for this visit.   Facility-Administered Medications Ordered in Other Visits  Medication Dose Route Frequency Provider Last Rate Last Admin   0.9 %  sodium chloride  infusion   Intravenous Continuous Sherrod Sherrod, MD 10 mL/hr at 11/06/24 1024 New Bag at 11/06/24 1024   0.9 %  sodium chloride  infusion   Intravenous Once Heilingoetter, Cassandra L, PA-C        VITAL SIGNS: There were no vitals taken for this visit. There were no vitals filed for this visit.  Estimated body mass index is 22.32 kg/m as calculated from the following:   Height as of an earlier encounter on 11/06/24: 5' 6 (1.676 m).   Weight as of an earlier encounter on 11/06/24: 138 lb 4.8 oz (62.7 kg).   PERFORMANCE STATUS (ECOG) : 2 - Symptomatic, <50% confined to bed  Physical Exam General: NAD Pulmonary: normal breathing pattern Extremities: no edema, no joint deformities Skin: no rashes Neurological: AAO x3  IMPRESSION: Discussed the use of AI scribe software for clinical note transcription with the patient, who gave verbal consent to proceed.  History of Present Illness Carolyn Wells is a 83 year old female with recently diagnosed metastatic lung cancer who was seen during her infusion for symptom management follow-up.  She is accompanied by her daughter. Patient resting  comfortably in recliner.  Denies concerns of nausea, vomiting, constipation, or diarrhea.  Reports ongoing fatigue and decreased appetite.  Nausea controlled with Compazine  as needed.  Her appetite is slowly improving. Weight has increased to 138lbs however her fluid intake remains minimal. Daughter reports insufficient water intake, consuming only one to one and a half 16-ounce bottles per day.  She consumes two Boost nutritional supplements daily as needed. We discussed importance of hydration to prevent dehydration and dizziness. She verbalized understanding.   Blood pressure today 102/66 with known recent changes to cardiac meds by Cardiologist. Patient recently seen in the ED for leg pain and found to have left leg DVT however is currently on Eliquis.   Patient reports she is sleeping better at bedtime with use of Xanax  1mg . Appreciative of this.   She is complaining of sciatica related pain and discomfort. Reports the nerve pain is affecting her hip, radiating  down her left leg to her knee. This issue was previously discussed during a hospital stay, but she has not received any medication for relief. She is seeking options to manage this pain.  She had an adverse reaction to gabapentin , prescribed for sciatica in the past, characterized by feeling 'loopy', dizzy, and  with a sensation of everything moving. She is unsure of previous dosage and would be willing to retry if lower dose is an option. Daughter plans to review all medications when they return home and will contact office back for further advisement.   All questions answered and support provided.   Assessment & Plan Insomnia Chronic insomnia managed with Xanax  1 mg as needed for sleep.  Xanax  has been effective in improving sleep quality. - Continue Xanax  1 mg as needed for sleep  Nausea Intermittent nausea managed with Compazine , which has been effective in alleviating symptoms. - Continue Compazine  as needed for  nausea  Sciatica Chronic sciatica with pain radiating down the left leg to the knee. Previous trial of gabapentin  resulted in significant dizziness and disorientation, described as feeling 'loopy' and 'groggy'. Concerns about trying Lyrica due to similar side effect profile. Current symptoms have persisted for three weeks, impacting daily activities. - Instructed patient to send message via MyChart or call with gabapentin  dose information. - Will consider trial of low-dose gabapentin  (100 mg at bedtime) if previous dose was stronger.  - Encouraged increased fluid intake to at least three to four 16-ounce bottles per day to maintain hydration.   Deep vein thrombosis of lower extremity Deep vein thrombosis in the lower extremity, currently managed with Xarelto . Recent ultrasound confirmed presence of deep vein thrombosis. Discussion about potential switch to Lovenox , but decision made to continue with Xarelto  due to patient's condition and preferences. - Continue Xarelto  for anticoagulation as managed by Dr. Sherrod.  I will plan to see patient back in 2-3 weeks. Sooner if needed.    Patient expressed understanding and was in agreement with this plan. She also understands that She can call the clinic at any time with any questions, concerns, or complaints.   Any controlled substances utilized were prescribed in the context of palliative care. PDMP has been reviewed.   I personally spent a total of 30 minutes in the care of the patient today including preparing to see the patient, getting/reviewing separately obtained history, performing a medically appropriate exam/evaluation, counseling and educating, documenting clinical information in the EHR, and independently interpreting results. Visit consisted of counseling and education dealing with the complex and emotionally intense issues of symptom management and palliative care in the setting of serious and potentially life-threatening illness.  Levon Borer, AGPCNP-BC  Palliative Medicine Team/[facility]    "

## 2024-11-07 ENCOUNTER — Encounter: Payer: Self-pay | Admitting: Internal Medicine

## 2024-11-07 ENCOUNTER — Other Ambulatory Visit: Payer: Self-pay | Admitting: *Deleted

## 2024-11-07 ENCOUNTER — Other Ambulatory Visit (HOSPITAL_COMMUNITY): Payer: Self-pay

## 2024-11-07 DIAGNOSIS — I4891 Unspecified atrial fibrillation: Secondary | ICD-10-CM

## 2024-11-07 DIAGNOSIS — C3411 Malignant neoplasm of upper lobe, right bronchus or lung: Secondary | ICD-10-CM

## 2024-11-07 DIAGNOSIS — J90 Pleural effusion, not elsewhere classified: Secondary | ICD-10-CM

## 2024-11-07 DIAGNOSIS — J910 Malignant pleural effusion: Secondary | ICD-10-CM

## 2024-11-07 DIAGNOSIS — I5033 Acute on chronic diastolic (congestive) heart failure: Secondary | ICD-10-CM

## 2024-11-07 MED ORDER — METOPROLOL SUCCINATE ER 25 MG PO TB24: 50.0000 mg | ORAL_TABLET | Freq: Every day | ORAL | 1 refills | Status: DC

## 2024-11-07 NOTE — Transitions of Care (Post Inpatient/ED Visit) (Signed)
 " Transition of Care week # 5/ day # 28 TOC 30-day program case closure  Visit Note  11/07/2024  Name: TAMBRIA PFANNENSTIEL MRN: [id number]          DOB: [birth year]  Situation: Patient enrolled in [facility] 30-day program. Visit completed with patient's caregiver- daughter [name], verified on [facility] DPR by telephone  HIPAA identifiers x 2 verified  11/07/24:  [facility] 30--day outreach completed; patient has successfully met/ accomplished previously established goals for [facility] 30-day program without hospital readmission and referral was sent for ongoing follow up with longitudinal RN CM   Background:  Recent hospitalization November 24-29, 2025 for CHF: recurrent (R) pleural effusion in setting of stage IV lung CA Resides alone, independent in most self-care activities; daughter- caregiver manages health care needs- lives 6 miles from patient and visits patient daily- attends all appointments with patient and provides all transportation; no current assistive device use: has walker- does not use (chronic and ongoing dizziness) (1) unplanned hospitalization x last (6)/ (12) months  Initial Transition Care Management Follow-up Telephone Call Discharge Date and Diagnosis: 10/07/24, Recurrent R pleural effusion / CHF in setting of stage IV lung ca   Past Medical History:  Diagnosis Date   ABDOMINAL INCISIONAL HERNIA 08/13/2009   s/p repair   Allergic rhinitis, cause unspecified 09/28/2011   BRADYCARDIA 09/26/2010   CKD (chronic kidney disease), stage III (HCC) 02/26/2015   Solitary kidney   COLONIC POLYPS, HX OF 08/25/2007   Complication of anesthesia    DIVERTICULOSIS, COLON 08/25/2007   Dysrhythmia    GASTROESOPHAGEAL REFLUX DISEASE 08/04/2007   HYPERLIPIDEMIA 09/27/2010   not on Rx therapy   HYPERTENSION 08/06/2007   Hypothyroidism    INTERNAL HEMORRHOIDS 08/04/2007   LOW BACK PAIN 08/06/2007   scoliosis; DJD; gets injections   Lung cancer (HCC)    OSTEOPENIA 08/04/2007   PAF (paroxysmal atrial  fibrillation) (HCC)    a.  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   Assessment:  with caregiver- daughter:  Things are going better; she is still dizzy but having a good day.  We just sat to eat at [employer] because she wanted to get out and felt good enough to go out.  They did drain off half-liter of fluid from her lungs on Christmas Eve and she has been fine since then; this dizziness is still happening, but the doctors have just told her to stay hydrated, which she is doing.  Still on Xarelto  since they re-started it on 10/28/24 in the ER; the clot in her leg seems better because it is not as painful to her.  Other than that- not anything to update you on, because this week things are going fine, other than the dizziness.  Nope, she is not using the walker or cane- has had no falls.    Caregiver denies clinical concerns and reports patient is in no distress; reports she and patient are out eating; does not have home weight or blood pressure values with her to review today  Patient Reported Symptoms: Cognitive Cognitive Status: Normal speech and language skills, Alert and oriented to person, place, and time, Insightful and able to interpret abstract concepts, Difficulties with attention and concentration, Requires Assistance Decision Making, Other: (per daughter- caregiver report: she continues to manage patient's healthcare affairs) Cognitive/Intellectual  Conditions Management [RPT]: None reported or documented in medical history or problem list      Neurological Neurological Review of Symptoms: Dizziness (per daughter- caregiver report) Oher Neurological Symptoms/Conditions [RPT]: Continues to report ongoing chronic dizziness on and off sometimes it's worse than other times; We are making sure she is  drinking a lot of fluid- that is what the doctors have told her to do; confirmed continues no longer taking amlodipine  as per previous report Neurological Management Strategies: Coping strategies, Routine screening  HEENT HEENT Symptoms Reported: No symptoms reported      Cardiovascular Cardiovascular Symptoms Reported: Dizziness (per caregiver- daughter report) Other Cardiovascular Symptoms: Continues to report ongoing chronic dizziness on and off sometimes it's worse than other times; We are making sure she is drinking a lot of fluid- that is what the doctors have told her to do; confirmed continues no longer taking amlodipine  as per previous report Does patient have uncontrolled Hypertension?: No Cardiovascular Management Strategies: Medication therapy, Routine screening, Coping strategies, Adequate rest Do You Have a Working Readable Scale?: Yes  Respiratory Respiratory Symptoms Reported: No symptoms reported (per caregiver- daughter report) Other Respiratory Symptoms: Her breathing is fine- they did end up draining off half-liter of fluid off her lungs on Christmas eve, and so far, there have been no issues with her breathing or her chest feeling tight Respiratory Management Strategies: Weight management, Asthma action plan, Adequate rest, Coping strategies, Medication therapy, Routine screening, Fluid modification  Endocrine Endocrine Symptoms Reported: No symptoms reported (per caregiver- daughter report) Is patient diabetic?: No    Gastrointestinal Gastrointestinal Symptoms Reported: No symptoms reported (per caregiver- daughter report) Additional Gastrointestinal Details: Her appetite seems to be a little better this week; she is pooping regularly and normally Gastrointestinal Management Strategies: Coping strategies    Genitourinary Genitourinary Symptoms Reported: No symptoms reported (per caregiver- daughter report) Additional Genitourinary Details: Peeing fine and peeing a  lot with all this fluid she is drinking to stay hydrated.  Urine is clear light yellow Genitourinary Management Strategies: Coping strategies  Integumentary Integumentary Symptoms Reported: No symptoms reported (per caregiver- daughter report)    Musculoskeletal Musculoskelatal Symptoms Reviewed: Unsteady gait, Other (per caregiver- daughter report) Other Musculoskeletal Symptoms: Continues to report ongoing intermittent chronic dizziness as per baseline; confirmed continues not currently requiring/ using assistive devices for ambulation; confirmed no new/ recent falls since last [facility] outreach 10/31/24 Musculoskeletal Management Strategies: Routine screening, Coping strategies   Fall risk Follow up: Falls prevention discussed  Psychosocial           There were no vitals filed for this visit.    Medications Reviewed Today     Reviewed by Lindsie Simar M, RN (Registered Nurse) on 11/07/24 at 1526  Med List Status: <None>   Medication Order Taking? Sig Documenting Provider Last Dose Status Informant  acetaminophen  (TYLENOL ) 500 MG tablet [id number]  Take 500 mg by mouth daily as needed for moderate pain (pain score 4-6) or mild pain (pain score 1-3).  Patient not taking: Reported on 10/12/2024   [provider]  Active Self, Pharmacy Records  ALPRAZolam  (XANAX ) 1 MG tablet [id number]  Take 1 tablet (1 mg total) by mouth at bedtime. Pickenpack-Cousar, Fannie SAILOR, NP  Active   Ascorbic Acid (VITAMIN C) 1000 MG tablet [id number]  Take 1,000 mg by mouth daily. [provider]  Active Self, Pharmacy Records  furosemide  (LASIX ) 20 MG tablet [id number]  Take 1 tablet (20 mg total) by mouth daily. Take extra dose as needed for  weight gain of 3 LB in 1 day, 5 LB in 1 week Segal, Jared E, DO  Active   levothyroxine  (SYNTHROID ) 50 MCG tablet [id number]  TAKE 1 TABLET BY MOUTH ONCE DAILY BEFORE BREAKFAST APPOINTMENT  REQUIRED  FOR  FUTURE  REFILLS [name], MD  Active Self, Pharmacy  Records  LUMIGAN 0.01 % SOLN [id number]  Place 1 drop into both eyes at bedtime.  [provider]  Active Self, Pharmacy Records           Med Note (COX, HEATHER C   Wed Apr 14, 2017  9:08 AM)    metoprolol  succinate (TOPROL -XL) 25 MG 24 hr tablet [id number]  Take 2 tablets (50 mg total) by mouth daily. Segal, Jared E, DO  Active   Multiple Vitamins-Minerals (PRESERVISION AREDS 2 PO) [id number]  Take 1 tablet by mouth daily. [provider]  Active Self, Pharmacy Records  Multiple Vitamins-Minerals (WOMENS 50+ MULTI VITAMIN/MIN PO) [id number]  Take 1 tablet by mouth daily. [provider]  Active Self, Pharmacy Records  Omega-3 Fatty Acids (FISH OIL TRIPLE STRENGTH) 1400 MG CAPS [id number]  Take 1,400 mg by mouth daily. [provider]  Active Self, Pharmacy Records  ondansetron  (ZOFRAN ) 8 MG tablet [id number]  Take 1 tablet (8 mg total) by mouth every 8 (eight) hours as needed for nausea or vomiting. Start on the third day after carboplatin . Sherrod Sherrod, MD  Active Self, Pharmacy Records  pantoprazole  (PROTONIX ) 40 MG tablet [id number]  Take 1 tablet (40 mg total) by mouth daily. TAKE 1 TABLET BY MOUTH ONCE DAILY  Patient taking differently: Take 40 mg by mouth daily.   [name], MD  Active Self, Pharmacy Records  Perfluorohexyloctane [facility]) 1.338 GM/ML SOLN [id number]  Place 1.338 mLs into both eyes 4 (four) times daily. [provider]  Active Self, Pharmacy Records  potassium chloride  (KLOR-CON  M) 10 MEQ tablet [id number]  Take 2 tablets (20 mEq total) by mouth every other day. Fairy Frames, MD  Active   prochlorperazine  (COMPAZINE ) 10 MG tablet [id number]  Take 1 tablet (10 mg total) by mouth every 6 (six) hours as needed for nausea or vomiting. Sherrod Sherrod, MD  Active Self, Pharmacy Records  Rivaroxaban  (XARELTO ) 15 MG TABS tablet [id number] Yes Take 1 tablet (15 mg total) by mouth daily with supper. Fairy Frames, MD  Active   vitamin k 100  MCG tablet [id number]  Take 100 mcg by mouth daily. [provider]  Active Self, Pharmacy Records           Recommendation:   Specialty provider follow-up: oncology treatment team 11/08/24 as scheduled Referral to: [facility] longitudinal RN CM Continue Current Plan of Care  Follow Up Plan:   Referral to RN Case Manager Closing From:  Transitions of Care Program  Pls call/ message for questions,  Verna Hamon Mckinney Makaylie Dedeaux, RN, BSN, Media Planner  Transitions of Care  [facility] - [facility] [phone]: direct office     "

## 2024-11-07 NOTE — Patient Instructions (Signed)
 Visit Information  Thank you for taking time to visit with me today. Please don't hesitate to contact me if I can be of assistance to you before our next scheduled telephone appointment.  It has been a pleasure working with you over the last 30 days!  Please listen for a call from the scheduling care guide to schedule a phone call with the new nurse care manager who will pick up in your care where we are leaving off today  Following are the goals we discussed today:  Patient Self Care Activities:  Attend all scheduled provider appointments Call pharmacy for medication refills 3-7 days in advance of running out of medications Call provider office for new concerns or questions  Take medications as prescribed   Continue pacing activity to avoid episodes of shortness of breath Continue to follow your established action plan for episodes of shortness of breath Weigh yourself every day to stay on top of early fluid retention: write down your weights every day so you remember what it is from day to day: follow the weight-gain guidelines and action plan to call your doctor if you gain more than 3 lbs overnight, or 5 lbs in one week Use assistive devices (cane/ walker) as needed to prevent falls, especially if you are feeling dizzy If you believe your condition is getting worse- contact your care providers (doctors) promptly- reaching out to your doctor early when you have concerns can prevent you from having to go to the hospital Ambulatory drain care Drain daily, up to max of 1L until patient is only able to drain out . If <142ml  for 3 consecutive drains every other day, then call Interventional Radiology [phone]) for evaluation and possible removal.  If you are experiencing a Mental Health or Behavioral Health Crisis or need someone to talk to, please  call the Suicide and Crisis Lifeline: 988 call the [place]  National Suicide Prevention Lifeline: 8042597882 or TTY: 307-234-2991 TTY  367-090-2879) to talk to a trained counselor call 1-800-273-TALK (toll free, 24 hour hotline) go to [facility] [address], [place] [phone]) call the [facility] Crisis Line: [phone] call 911   Care plan and visit instructions communicated with the patient's caregiver verbally today. Caregiver agrees to receive a copy in MyChart; active MyChart status and patient understanding of how to access instructions and care plan via MyChart confirmed     Beatris Blinda Lawrence, RN, BSN, CCRN Alumnus RN Care Manager  Transitions of Care  [facility] - [facility] [phone]: direct office

## 2024-11-08 ENCOUNTER — Inpatient Hospital Stay

## 2024-11-08 VITALS — BP 117/83 | HR 81 | Resp 15

## 2024-11-08 DIAGNOSIS — C3411 Malignant neoplasm of upper lobe, right bronchus or lung: Secondary | ICD-10-CM | POA: Diagnosis not present

## 2024-11-08 MED ORDER — PEGFILGRASTIM-JMDB 6 MG/0.6ML ~~LOC~~ SOSY
6.0000 mg | PREFILLED_SYRINGE | Freq: Once | SUBCUTANEOUS | Status: AC
  Administered 2024-11-08: 6 mg via SUBCUTANEOUS
  Filled 2024-11-08: qty 0.6

## 2024-11-10 NOTE — Addendum Note (Signed)
 Encounter addended by: Janice Lynwood BROCKS on: 11/10/2024 5:21 PM  Actions taken: Imaging Exam ended

## 2024-11-11 ENCOUNTER — Encounter: Payer: Self-pay | Admitting: Internal Medicine

## 2024-11-13 ENCOUNTER — Inpatient Hospital Stay

## 2024-11-13 ENCOUNTER — Inpatient Hospital Stay: Attending: Internal Medicine

## 2024-11-13 DIAGNOSIS — N183 Chronic kidney disease, stage 3 unspecified: Secondary | ICD-10-CM | POA: Insufficient documentation

## 2024-11-13 DIAGNOSIS — D649 Anemia, unspecified: Secondary | ICD-10-CM

## 2024-11-13 DIAGNOSIS — C3411 Malignant neoplasm of upper lobe, right bronchus or lung: Secondary | ICD-10-CM | POA: Insufficient documentation

## 2024-11-13 DIAGNOSIS — I129 Hypertensive chronic kidney disease with stage 1 through stage 4 chronic kidney disease, or unspecified chronic kidney disease: Secondary | ICD-10-CM | POA: Diagnosis not present

## 2024-11-13 DIAGNOSIS — Z79899 Other long term (current) drug therapy: Secondary | ICD-10-CM | POA: Insufficient documentation

## 2024-11-13 DIAGNOSIS — Z905 Acquired absence of kidney: Secondary | ICD-10-CM | POA: Diagnosis not present

## 2024-11-13 DIAGNOSIS — Z5111 Encounter for antineoplastic chemotherapy: Secondary | ICD-10-CM | POA: Diagnosis present

## 2024-11-13 LAB — CBC WITH DIFFERENTIAL (CANCER CENTER ONLY)
Abs Immature Granulocytes: 0.01 K/uL (ref 0.00–0.07)
Basophils Absolute: 0 K/uL (ref 0.0–0.1)
Basophils Relative: 1 %
Eosinophils Absolute: 0 K/uL (ref 0.0–0.5)
Eosinophils Relative: 1 %
HCT: 25.4 % — ABNORMAL LOW (ref 36.0–46.0)
Hemoglobin: 8.2 g/dL — ABNORMAL LOW (ref 12.0–15.0)
Immature Granulocytes: 0 %
Lymphocytes Relative: 16 %
Lymphs Abs: 0.5 K/uL — ABNORMAL LOW (ref 0.7–4.0)
MCH: 28.4 pg (ref 26.0–34.0)
MCHC: 32.3 g/dL (ref 30.0–36.0)
MCV: 87.9 fL (ref 80.0–100.0)
Monocytes Absolute: 0.7 K/uL (ref 0.1–1.0)
Monocytes Relative: 23 %
Neutro Abs: 1.6 K/uL — ABNORMAL LOW (ref 1.7–7.7)
Neutrophils Relative %: 59 %
Platelet Count: 232 K/uL (ref 150–400)
RBC: 2.89 MIL/uL — ABNORMAL LOW (ref 3.87–5.11)
RDW: 18.2 % — ABNORMAL HIGH (ref 11.5–15.5)
WBC Count: 2.9 K/uL — ABNORMAL LOW (ref 4.0–10.5)
nRBC: 0 % (ref 0.0–0.2)

## 2024-11-13 LAB — CMP (CANCER CENTER ONLY)
ALT: 31 U/L (ref 0–44)
AST: 18 U/L (ref 15–41)
Albumin: 3.4 g/dL — ABNORMAL LOW (ref 3.5–5.0)
Alkaline Phosphatase: 191 U/L — ABNORMAL HIGH (ref 38–126)
Anion gap: 12 (ref 5–15)
BUN: 36 mg/dL — ABNORMAL HIGH (ref 8–23)
CO2: 22 mmol/L (ref 22–32)
Calcium: 9.2 mg/dL (ref 8.9–10.3)
Chloride: 103 mmol/L (ref 98–111)
Creatinine: 1.77 mg/dL — ABNORMAL HIGH (ref 0.44–1.00)
GFR, Estimated: 28 mL/min — ABNORMAL LOW
Glucose, Bld: 184 mg/dL — ABNORMAL HIGH (ref 70–99)
Potassium: 4.8 mmol/L (ref 3.5–5.1)
Sodium: 137 mmol/L (ref 135–145)
Total Bilirubin: 0.3 mg/dL (ref 0.0–1.2)
Total Protein: 6.3 g/dL — ABNORMAL LOW (ref 6.5–8.1)

## 2024-11-13 LAB — SAMPLE TO BLOOD BANK

## 2024-11-13 LAB — PREPARE RBC (CROSSMATCH)

## 2024-11-13 MED ORDER — SODIUM CHLORIDE 0.9 % IV SOLN: Freq: Once | INTRAVENOUS | Status: AC

## 2024-11-13 MED ORDER — ACETAMINOPHEN 325 MG PO TABS
650.0000 mg | ORAL_TABLET | Freq: Once | ORAL | Status: AC
  Administered 2024-11-13: 650 mg via ORAL
  Filled 2024-11-13: qty 2

## 2024-11-13 MED ORDER — SODIUM CHLORIDE 0.9% FLUSH: 10.0000 mL | INTRAVENOUS | Status: DC | PRN

## 2024-11-13 MED ORDER — CETIRIZINE HCL 10 MG PO TABS
10.0000 mg | ORAL_TABLET | Freq: Once | ORAL | Status: AC
  Administered 2024-11-13: 10 mg via ORAL
  Filled 2024-11-13: qty 1

## 2024-11-13 MED ORDER — SODIUM CHLORIDE 0.9% IV SOLUTION
250.0000 mL | INTRAVENOUS | Status: DC
  Administered 2024-11-13: 100 mL via INTRAVENOUS

## 2024-11-13 NOTE — Progress Notes (Signed)
 Patient presented today at 1111 with a complaint of chest Pain, dizziness, and Shortness of breath. Pts PAC was accessed and labs were drawn with no issues. Dr Sherrod and RN Diane B were notified about pts symptoms.

## 2024-11-13 NOTE — Progress Notes (Signed)
 Blood transfusion orders entered and BB notified.  IVF ordered under sign and held

## 2024-11-13 NOTE — Patient Instructions (Signed)
 Blood Transfusion, Adult, Care After After a blood transfusion, it is common to have: Bruising and soreness at the IV site. A headache. Follow these instructions at home: Your doctor may give you more instructions. If you have problems, contact your doctor. Insertion site care     Follow instructions from your doctor about how to take care of your insertion site. This is where an IV tube was put into your vein. Make sure you: Wash your hands with soap and water for at least 20 seconds before and after you change your bandage. If you cannot use soap and water, use hand sanitizer. Change your bandage as told by your doctor. Check your insertion site every day for signs of infection. Check for: Redness, swelling, or pain. Bleeding from the site. Warmth. Pus or a bad smell. General instructions Take over-the-counter and prescription medicines only as told by your doctor. Rest as told by your doctor. Go back to your normal activities as told by your doctor. Keep all follow-up visits. You may need to have tests at certain times to check your blood. Contact a doctor if: You have itching or red, swollen areas of skin (hives). You have a fever or chills. You have pain in the head, back, or chest. You feel worried or nervous (anxious). You feel weak after doing your normal activities. You have any of these problems at the insertion site: Redness, swelling, warmth, or pain. Bleeding that does not stop with pressure. Pus or a bad smell. If you received your blood transfusion in an outpatient setting, you will be told whom to contact to report any reactions. Get help right away if: You have signs of a serious reaction. This may be coming from an allergy or the body's defense system (immune system). Signs include: Trouble breathing or shortness of breath. Swelling of the face or feeling warm (flushed). A widespread rash. Dark pee (urine) or blood in the pee. Fast heartbeat. These symptoms  may be an emergency. Get help right away. Call 911. Do not wait to see if the symptoms will go away. Do not drive yourself to the hospital. Summary Bruising and soreness at the IV site are common. Check your insertion site every day for signs of infection. Rest as told by your doctor. Go back to your normal activities as told by your doctor. Get help right away if you have signs of a serious reaction. This information is not intended to replace advice given to you by your health care provider. Make sure you discuss any questions you have with your health care provider. Dehydration, Adult Dehydration is a condition in which there is not enough water or other fluids in the body. This happens when a person loses more fluids than they take in. Important organs cannot work right without the right amount of fluids. Any loss of fluids from the body can cause dehydration. Dehydration can be mild, worse, or very bad. It should be treated right away to keep it from getting very bad. What are the causes? Conditions that cause loss of water in the body. They include: Watery poop (diarrhea). Vomiting. Sweating a lot. Fever. Infection. Peeing (urinating) a lot. Not drinking enough fluids. Certain medicines, such as medicines that take extra fluid out of the body (diuretics). Lack of safe drinking water. Not being able to get enough water and food. What increases the risk? Having a long-term (chronic) illness that has not been treated the right way, such as: Diabetes. Heart disease. Kidney disease. Being  20 years of age or older. Having a disability. Living in a place that is high above the ground or sea (high in altitude). The thinner, drier air causes more fluid loss. Doing exercises that put stress on your body for a long time. Being active when in hot places. What are the signs or symptoms? Symptoms of dehydration depend on how bad it is. Mild or worse dehydration Thirst. Dry lips or dry  mouth. Feeling dizzy or light-headed. Muscle cramps. Passing little pee or dark pee. Pee may be the color of tea. Headache. Very bad dehydration Changes in skin. Skin may: Be cold to the touch (clammy). Be blotchy or pale. Not go back to normal right after you pinch it and let it go. Little or no tears, pee, or sweat. Fast breathing. Low blood pressure. Weak pulse. Pulse that is more than 100 beats a minute when you are sitting still. Other changes, such as: Feeling very thirsty. Eyes that look hollow (sunken). Cold hands and feet. Being confused. Being very tired (lethargic) or having trouble waking from sleep. Losing weight. Loss of consciousness. How is this treated? Treatment for this condition depends on how bad your dehydration is. Treatment should start right away. Do not wait until your condition gets very bad. Very bad dehydration is an emergency. You will need to go to a hospital. Mild or worse dehydration can be treated at home. You may be asked to: Drink more fluids. Drink an oral rehydration solution (ORS). This drink gives you the right amount of fluids, salts, and minerals (electrolytes). Very bad dehydration can be treated: With fluids through an IV tube. By correcting low levels of electrolytes in the body. By treating the problem that caused your dehydration. Follow these instructions at home: Oral rehydration solution If told by your doctor, drink an ORS: Make an ORS. Use instructions on the package. Start by drinking small amounts, about  cup (120 mL) every 5-10 minutes. Slowly drink more until you have had the amount that your doctor said to have.  Eating and drinking  Drink enough clear fluid to keep your pee pale yellow. If you were told to drink an ORS, finish the ORS first. Then, start slowly drinking other clear fluids. Drink fluids such as: Water. Do not drink only water. Doing that can make the salt (sodium) level in your body get too low. Water  from ice chips you suck on. Fruit juice that you have added water to (diluted). Low-calorie sports drinks. Eat foods that have the right amounts of salts and minerals, such as bananas, oranges, potatoes, tomatoes, or spinach. Do not drink alcohol. Avoid drinks that have caffeine or sugar. These include:: High-calorie sports drinks. Fruit juice that you did not add water to. Soda. Coffee or energy drinks. Avoid foods that are greasy or have a lot of fat or sugar. General instructions Take over-the-counter and prescription medicines only as told by your doctor. Do not take sodium tablets. Doing that can make the salt level in your body get too high. Return to your normal activities as told by your doctor. Ask your doctor what activities are safe for you. Keep all follow-up visits. Your doctor may check and change your treatment. Contact a doctor if: You have pain in your belly (abdomen) and the pain: Gets worse. Stays in one place. You have a rash. You have a stiff neck. You get angry or annoyed more easily than normal. You are more tired or have a harder time waking than  normal. You feel weak or dizzy. You feel very thirsty. Get help right away if: You have any symptoms of very bad dehydration. You vomit every time you eat or drink. Your vomiting gets worse, does not go away, or you vomit blood or green stuff. You are getting treatment, but symptoms are getting worse. You have a fever. You have a very bad headache. You have: Diarrhea that gets worse or does not go away. Blood in your poop (stool). This may cause poop to look black and tarry. No pee in 6-8 hours. Only a small amount of pee in 6-8 hours, and the pee is very dark. You have trouble breathing. These symptoms may be an emergency. Get help right away. Call 911. Do not wait to see if the symptoms will go away. Do not drive yourself to the hospital. This information is not intended to replace advice given to you by  your health care provider. Make sure you discuss any questions you have with your health care provider. Document Revised: 05/25/2022 Document Reviewed: 05/25/2022 Elsevier Patient Education  2024 Elsevier Inc. Document Revised: 01/23/2022 Document Reviewed: 01/23/2022 Elsevier Patient Education  2024 [employer].

## 2024-11-14 ENCOUNTER — Ambulatory Visit (HOSPITAL_COMMUNITY)
Admission: RE | Admit: 2024-11-14 | Discharge: 2024-11-14 | Disposition: A | Source: Ambulatory Visit | Attending: Student

## 2024-11-14 ENCOUNTER — Ambulatory Visit (HOSPITAL_COMMUNITY)
Admission: RE | Admit: 2024-11-14 | Discharge: 2024-11-14 | Disposition: A | Discharge status: Home / Self Care | Source: Ambulatory Visit | Attending: Nurse Practitioner | Admitting: Nurse Practitioner

## 2024-11-14 ENCOUNTER — Telehealth: Payer: Self-pay | Admitting: Medical Oncology

## 2024-11-14 VITALS — BP 114/72

## 2024-11-14 DIAGNOSIS — C3411 Malignant neoplasm of upper lobe, right bronchus or lung: Secondary | ICD-10-CM | POA: Diagnosis present

## 2024-11-14 DIAGNOSIS — J910 Malignant pleural effusion: Secondary | ICD-10-CM | POA: Diagnosis not present

## 2024-11-14 HISTORY — PX: IR THORACENTESIS ASP PLEURAL SPACE W/IMG GUIDE: IMG5380

## 2024-11-14 LAB — TYPE AND SCREEN
ABO/RH(D): O POS
Antibody Screen: NEGATIVE
Unit division: 0

## 2024-11-14 LAB — BPAM RBC
Blood Product Expiration Date: 202602022359
ISSUE DATE / TIME: 202601051322
Unit Type and Rh: 5100

## 2024-11-14 MED ORDER — LIDOCAINE-EPINEPHRINE 1 %-1:100000 IJ SOLN
INTRAMUSCULAR | Status: AC
  Filled 2024-11-14: qty 20

## 2024-11-14 MED ORDER — LIDOCAINE-EPINEPHRINE 1 %-1:100000 IJ SOLN
10.0000 mL | Freq: Once | INTRAMUSCULAR | Status: AC
  Administered 2024-11-14: 10 mL via INTRADERMAL

## 2024-11-14 NOTE — Procedures (Signed)
 PROCEDURE SUMMARY:  Successful US  guided right thoracentesis. Yielded 900 ml of clear yellow fluid. Pt tolerated procedure well. No immediate complications.  Specimen not sent for labs. CXR ordered; no post-procedure pneumothorax identified.   EBL < 2 mL  Warren JONELLE Dais, NP 11/14/2024 3:29 PM

## 2024-11-14 NOTE — Telephone Encounter (Signed)
 3 small lesions on buttocks-using hydrocortisone  and they seem to be drying up I told dtr to continue care and call if they worsen..  Tingling/Numbness in feet , She took Gabapentin  100 mg and it made her feel  woozy so she did not take anymore. Can she use ice during her taxol  tx. ?  Please advise re: feet.

## 2024-11-20 ENCOUNTER — Inpatient Hospital Stay

## 2024-11-20 DIAGNOSIS — Z5111 Encounter for antineoplastic chemotherapy: Secondary | ICD-10-CM | POA: Diagnosis not present

## 2024-11-20 DIAGNOSIS — C3411 Malignant neoplasm of upper lobe, right bronchus or lung: Secondary | ICD-10-CM

## 2024-11-20 LAB — CBC WITH DIFFERENTIAL (CANCER CENTER ONLY)
Abs Immature Granulocytes: 0.25 K/uL — ABNORMAL HIGH (ref 0.00–0.07)
Basophils Absolute: 0.1 K/uL (ref 0.0–0.1)
Basophils Relative: 1 %
Eosinophils Absolute: 0 K/uL (ref 0.0–0.5)
Eosinophils Relative: 0 %
HCT: 30.4 % — ABNORMAL LOW (ref 36.0–46.0)
Hemoglobin: 9.9 g/dL — ABNORMAL LOW (ref 12.0–15.0)
Immature Granulocytes: 1 %
Lymphocytes Relative: 4 %
Lymphs Abs: 0.7 K/uL (ref 0.7–4.0)
MCH: 28.4 pg (ref 26.0–34.0)
MCHC: 32.6 g/dL (ref 30.0–36.0)
MCV: 87.1 fL (ref 80.0–100.0)
Monocytes Absolute: 1.1 K/uL — ABNORMAL HIGH (ref 0.1–1.0)
Monocytes Relative: 6 %
Neutro Abs: 17.5 K/uL — ABNORMAL HIGH (ref 1.7–7.7)
Neutrophils Relative %: 88 %
Platelet Count: 170 K/uL (ref 150–400)
RBC: 3.49 MIL/uL — ABNORMAL LOW (ref 3.87–5.11)
RDW: 19.5 % — ABNORMAL HIGH (ref 11.5–15.5)
WBC Count: 19.7 K/uL — ABNORMAL HIGH (ref 4.0–10.5)
nRBC: 0 % (ref 0.0–0.2)

## 2024-11-20 LAB — CMP (CANCER CENTER ONLY)
ALT: 25 U/L (ref 0–44)
AST: 19 U/L (ref 15–41)
Albumin: 3.3 g/dL — ABNORMAL LOW (ref 3.5–5.0)
Alkaline Phosphatase: 256 U/L — ABNORMAL HIGH (ref 38–126)
Anion gap: 13 (ref 5–15)
BUN: 23 mg/dL (ref 8–23)
CO2: 22 mmol/L (ref 22–32)
Calcium: 9 mg/dL (ref 8.9–10.3)
Chloride: 104 mmol/L (ref 98–111)
Creatinine: 1.42 mg/dL — ABNORMAL HIGH (ref 0.44–1.00)
GFR, Estimated: 36 mL/min — ABNORMAL LOW
Glucose, Bld: 115 mg/dL — ABNORMAL HIGH (ref 70–99)
Potassium: 4.5 mmol/L (ref 3.5–5.1)
Sodium: 139 mmol/L (ref 135–145)
Total Bilirubin: 0.3 mg/dL (ref 0.0–1.2)
Total Protein: 6.7 g/dL (ref 6.5–8.1)

## 2024-11-20 LAB — TSH: TSH: 3.41 u[IU]/mL (ref 0.350–4.500)

## 2024-11-21 LAB — T4: T4, Total: 8 ug/dL (ref 4.5–12.0)

## 2024-11-21 NOTE — Progress Notes (Signed)
 [facility] OFFICE PROGRESS NOTE  Carolyn Wells ORN, MD [address] [place] [address]  DIAGNOSIS: Stage IV (T2b, N3, M1 a) non-small cell lung cancer, adenocarcinoma presented with right upper lobe lung mass in addition to right hilar, mediastinal and right supraclavicular lymphadenopathy as well as malignant right pleural effusion diagnosed in October 2025    Molecular studies by foundation 1 showed positive KRAS G12C mutation and PD-L1 expression of 40%.   PRIOR THERAPY: None  CURRENT THERAPY: Systemic chemotherapy with carboplatin  for an AUC of 5, paclitaxel , and Keytruda  IV every 3 weeks. First dose on 10/16/2024. She is status post 2 cycles. Her dose of chemotherapy is reduced to carboplatin  for an AUC of 4 and taxol  150 mg/m2.   INTERVAL HISTORY: Carolyn Wells 84 y.o. female returns to the clinic today for a follow-up visit accompanied by her daughter. She was last seen in clinic on 11/06/2024 by Dr. Sherrod. She is currently undergoing chemotherapy and immunotherapy.  Her chemotherapy dose is reduced due to severe fatigue, dizziness, and fogginess.  She has not experienced significant weight loss since her last visit and maintains her nutritional status with one to two protein supplements daily.  She describes persistent chest tightness since diagnosis, characterized as a 'belt around my chest,' located under the right breast, which is constant but sometimes relieved by sleeping on her left side. She recently underwent thoracentesis for malignant pleural effusion, which improved her dyspnea, though she continues to experience stable shortness of breath and chest tightness. Oxygen saturation is currently 100%. She has an intermittent cough, unchanged from prior visits, with expectoration of white or clear sputum and no hemoptysis. No visible bleeding noted.  She experienced nausea and a single episode of emesis this morning, attributed to fasting prior to her  appointment, but does not routinely have nausea or vomiting after chemotherapy. She generally feels well post-infusion. She denies rashes, headaches, or visual changes. Her hemoglobin has recently increased, and she has not required transfusion.  She has chronic atrial fibrillation and is currently taking metoprolol  and an anticoagulant as prescribed by her cardiologist. She does not take amiodarone . Blood pressure has been low, with a recent measurement of 95/73 mmHg. She is scheduled to see palliative care today. She denies abnormal bleeding or bruising.   Denies any diarrhea or constipation.  Denies any rashes or skin changes.  Denies any headache or visual changes.  She follows closely with palliative care.  She is here today for evaluation and repeat blood work before undergoing cycle #3.  MEDICAL HISTORY: Past Medical History:  Diagnosis Date   ABDOMINAL INCISIONAL HERNIA 08/13/2009   s/p repair   Allergic rhinitis, cause unspecified 09/28/2011   BRADYCARDIA 09/26/2010   CKD (chronic kidney disease), stage III (HCC) 02/26/2015   Solitary kidney   COLONIC POLYPS, HX OF 08/25/2007   Complication of anesthesia    DIVERTICULOSIS, COLON 08/25/2007   Dysrhythmia    GASTROESOPHAGEAL REFLUX DISEASE 08/04/2007   HYPERLIPIDEMIA 09/27/2010   not on Rx therapy   HYPERTENSION 08/06/2007   Hypothyroidism    INTERNAL HEMORRHOIDS 08/04/2007   LOW BACK PAIN 08/06/2007   scoliosis; DJD; gets injections   Lung cancer (HCC)    OSTEOPENIA 08/04/2007   PAF (paroxysmal atrial fibrillation) (HCC)    a.  Echo 2/17:  Vigorous LVF, EF 65-70%, no RWMA, Ao sclerosis, trivial AI, trivial MR, trivial TR, PASP 23 mmHg   PONV (postoperative nausea and vomiting)    Solitary kidney, acquired 09/28/2011  donated R kidney to husband    ALLERGIES:  is allergic to budesonide, elavil  [amitriptyline ], gabapentin , tramadol , hydrocodone, and ipratropium.  MEDICATIONS:  Current Outpatient Medications   Medication Sig Dispense Refill   acetaminophen  (TYLENOL ) 500 MG tablet Take 500 mg by mouth daily as needed for moderate pain (pain score 4-6) or mild pain (pain score 1-3).     ALPRAZolam  (XANAX ) 1 MG tablet Take 1 tablet (1 mg total) by mouth at bedtime. 30 tablet 1   amiodarone  (PACERONE ) 200 MG tablet Take 200 mg by mouth daily.     Ascorbic Acid (VITAMIN C) 1000 MG tablet Take 1,000 mg by mouth daily.     furosemide  (LASIX ) 20 MG tablet Take 1 tablet (20 mg total) by mouth daily. Take extra dose as needed for weight gain of 3 LB in 1 day, 5 LB in 1 week 60 tablet 0   levothyroxine  (SYNTHROID ) 50 MCG tablet TAKE 1 TABLET BY MOUTH ONCE DAILY BEFORE BREAKFAST . APPOINTMENT REQUIRED FOR FUTURE REFILLS 90 tablet 0   lidocaine -prilocaine  (EMLA ) cream Apply topically once.     LUMIGAN 0.01 % SOLN Place 1 drop into both eyes at bedtime.      metoprolol  succinate (TOPROL -XL) 25 MG 24 hr tablet Take 2 tablets (50 mg total) by mouth daily. 180 tablet 1   metoprolol  tartrate (LOPRESSOR ) 25 MG tablet Take 75 mg by mouth daily.     Multiple Vitamins-Minerals (PRESERVISION AREDS 2 PO) Take 1 tablet by mouth daily.     Multiple Vitamins-Minerals (WOMENS 50+ MULTI VITAMIN/MIN PO) Take 1 tablet by mouth daily.     Omega-3 Fatty Acids (FISH OIL TRIPLE STRENGTH) 1400 MG CAPS Take 1,400 mg by mouth daily.     ondansetron  (ZOFRAN ) 8 MG tablet Take 1 tablet (8 mg total) by mouth every 8 (eight) hours as needed for nausea or vomiting. Start on the third day after carboplatin . 30 tablet 1   pantoprazole  (PROTONIX ) 40 MG tablet Take 1 tablet (40 mg total) by mouth daily. TAKE 1 TABLET BY MOUTH ONCE DAILY (Patient taking differently: Take 40 mg by mouth daily.) 90 tablet 3   Perfluorohexyloctane (MIEBO) 1.338 GM/ML SOLN Place 1.338 mLs into both eyes 4 (four) times daily.     potassium chloride  (KLOR-CON  M) 10 MEQ tablet Take 2 tablets (20 mEq total) by mouth every other day. 60 tablet 0   prochlorperazine  (COMPAZINE )  10 MG tablet Take 1 tablet (10 mg total) by mouth every 6 (six) hours as needed for nausea or vomiting. 30 tablet 1   [MAR Hold] Rivaroxaban  (XARELTO ) 15 MG TABS tablet Take 1 tablet (15 mg total) by mouth daily with supper.     vitamin k 100 MCG tablet Take 100 mcg by mouth daily.     No current facility-administered medications for this visit.   Facility-Administered Medications Ordered in Other Visits  Medication Dose Route Frequency Provider Last Rate Last Admin   0.9 %  sodium chloride  infusion   Intravenous Once [place] L, PA-C        SURGICAL HISTORY:  Past Surgical History:  Procedure Laterality Date   CARDIOVERSION [name] 08/30/2024   Procedure: CARDIOVERSION;  Surgeon: Francyne Headland, MD;  Location: [facility];  Service: Cardiovascular;  Laterality: [name];   IR IMAGING GUIDED PORT INSERTION  10/09/2024   IR PERC PLEURAL DRAIN W/INDWELL CATH W/IMG GUIDE  10/04/2024   IR THORACENTESIS RIGHT ASP PLEURAL SPACE W/IMG GUIDE  09/06/2024   IR THORACENTESIS RIGHT ASP PLEURAL  SPACE W/IMG GUIDE  09/14/2024   IR THORACENTESIS RIGHT ASP PLEURAL SPACE W/IMG GUIDE  09/28/2024   IR THORACENTESIS RIGHT ASP PLEURAL SPACE W/IMG GUIDE  11/01/2024   IR THORACENTESIS RIGHT ASP PLEURAL SPACE W/IMG GUIDE  11/14/2024   LAMINECTOMY  1999   L4-5/lumbar   NEPHRECTOMY     Right   s/o incisional hernia repair     RUQ   STRABISMUS SURGERY Bilateral 04/14/2019   Procedure: STRABISMUS REPAIR BILATERAL;  Surgeon: Neysa Fallow, MD;  Location: [facility];  Service: Ophthalmology;  Laterality: Bilateral;   TONSILLECTOMY      REVIEW OF SYSTEMS:   Review of Systems  Constitutional: Positive for fatigue and intermittent dizziness. Negative for chills, fever and unexpected weight change.  HENT: Negative for mouth sores, nosebleeds, sore throat and trouble swallowing.   Eyes: Negative for eye problems and icterus.  Respiratory: Positive for stable cough and dyspnea on exertion.  Positive for band like sensation around diaphragm (stable). Negative for  hemoptysis and wheezing.   Cardiovascular: Negative for chest pain and leg swelling.  Gastrointestinal: Positive for episode of nausea/vomiting. Negative for abdominal pain, constipation, or diarrhea.  Genitourinary: Negative for bladder incontinence, difficulty urinating, dysuria, frequency and hematuria.   Musculoskeletal: Positive for chronic back pain. Negative for gait problem, neck pain and neck stiffness.  Skin: Negative for itching and rash.  Neurological: Positive for intermittent lightheadedness. Negative for extremity weakness, gait problem, headaches, and seizures.  Hematological: Negative for adenopathy. Does not bruise/bleed easily.  Psychiatric/Behavioral: Negative for confusion, depression and sleep disturbance. The patient is not nervous/anxious   PHYSICAL EXAMINATION:  Blood pressure 97/73, pulse 73, temperature 97.6 F (36.4 C), temperature source Oral, resp. rate 16, height 5' 6 (1.676 m), weight 139 lb 12.8 oz (63.4 kg), SpO2 100%.  ECOG PERFORMANCE STATUS: 2  Physical Exam  Constitutional: Oriented to person, place, and time and chronically ill appearing, and in no distress.  HENT:  Head: Normocephalic and atraumatic.  Mouth/Throat: Oropharynx is clear and moist. No oropharyngeal exudate.  Eyes: Conjunctivae are normal. Right eye exhibits no discharge. Left eye exhibits no discharge. No scleral icterus.  Neck: Normal range of motion. Neck supple.  Cardiovascular: Normal rate, irregular rhythm, normal heart sounds and intact distal pulses.   Pulmonary/Chest: Effort normal. Quiet breath sounds bilaterally. No respiratory distress. No wheezes. No rales.  Abdominal: Soft. Bowel sounds are normal. Exhibits no distension and no mass. There is no tenderness.  Musculoskeletal: Normal range of motion. Exhibits no edema.  Lymphadenopathy:    No cervical adenopathy.  Neurological: Alert and oriented to  person, place, and time. Exhibits muscle wasting. Examined in the wheelchair.  Skin: Skin is warm and dry. No rash noted. Not diaphoretic. No erythema. No pallor.  Psychiatric: Mood, memory and judgment normal.  Vitals reviewed.  LABORATORY DATA: Lab Results  Component Value Date   WBC 13.3 (H) 11/27/2024   HGB 10.4 (L) 11/27/2024   HCT 32.2 (L) 11/27/2024   MCV 87.5 11/27/2024   PLT 328 11/27/2024      Chemistry      Component Value Date/Time   NA 141 11/27/2024 0914   NA 141 02/02/2023 1406   K 4.2 11/27/2024 0914   CL 104 11/27/2024 0914   CO2 23 11/27/2024 0914   BUN 25 (H) 11/27/2024 0914   BUN 28 (H) 02/02/2023 1406   CREATININE 1.62 (H) 11/27/2024 0914   CREATININE 1.36 (H) 02/07/2016 0942      Component Value Date/Time  CALCIUM  9.9 11/27/2024 0914   ALKPHOS 206 (H) 11/27/2024 0914   AST 18 11/27/2024 0914   ALT 20 11/27/2024 0914   BILITOT 0.4 11/27/2024 0914       RADIOGRAPHIC STUDIES:  IR THORACENTESIS ASP PLEURAL SPACE W/IMG GUIDE Result Date: 11/14/2024 INDICATION: Patient with a history of right lung cancer with recurrent right pleural effusion. Interventional Radiology asked to perform a therapeutic thoracentesis. EXAM: ULTRASOUND GUIDED  THORACENTESIS MEDICATIONS: 1% lidocaine  10 mL COMPLICATIONS: None immediate. PROCEDURE: An ultrasound guided thoracentesis was thoroughly discussed with the patient and questions answered. The benefits, risks, alternatives and complications were also discussed. The patient understands and wishes to proceed with the procedure. Written consent was obtained. Ultrasound was performed to localize and mark an adequate pocket of fluid in the right chest. The area was then prepped and draped in the normal sterile fashion. 1% Lidocaine  was used for local anesthesia. Under ultrasound guidance a 6 Fr Safe-T-Centesis catheter was introduced. Thoracentesis was performed. The catheter was removed and a dressing applied. FINDINGS: A total of  approximately 900 mL of clear yellow fluid was removed. IMPRESSION: Successful ultrasound guided right thoracentesis yielding 900 mL of pleural fluid. Procedure performed by: Warren Dais, NP Electronically Signed   By: Ester Sides M.D.   On: 11/14/2024 15:38   DG Chest 1 View Result Date: 11/14/2024 EXAM: 1 VIEW(S) XRAY OF THE CHEST 11/14/2024 02:20:00 PM COMPARISON: 11/01/2024 CLINICAL HISTORY: S/P thoracentesis FINDINGS: LINES, TUBES AND DEVICES: Right chest Port-A-Cath in place with tip at superior cavoatrial junction, unchanged. LUNGS AND PLEURA: Right upper lung mass again seen. Patchy left lung base opacity. Small bilateral pleural effusions. No pneumothorax. HEART AND MEDIASTINUM: No acute abnormality of the cardiac and mediastinal silhouettes. BONES AND SOFT TISSUES: No acute osseous abnormality. IMPRESSION: 1. No pneumothorax. 2. Right upper lung mass. 3. Stable small bilateral pleural effusions. Electronically signed by: Greig Pique MD 11/14/2024 03:09 PM EST RP Workstation: [id number]   IR THORACENTESIS ASP PLEURAL SPACE W/IMG GUIDE Result Date: 11/01/2024 INDICATION: fluid Patient with a history of right lung cancer with recurrent right pleural effusion. Interventional Radiology asked to perform a therapeutic thoracentesis. EXAM: ULTRASOUND GUIDED THORACENTESIS MEDICATIONS: 1% lidocaine  10 mL COMPLICATIONS: None immediate. PROCEDURE: An ultrasound guided thoracentesis was thoroughly discussed with the patient and questions answered. The benefits, risks, alternatives and complications were also discussed. The patient understands and wishes to proceed with the procedure. Written consent was obtained. Ultrasound was performed to localize and mark an adequate pocket of fluid in the right chest. The area was then prepped and draped in the normal sterile fashion. 1% Lidocaine  was used for local anesthesia. Under ultrasound guidance a 6 Fr Safe-T-Centesis catheter was introduced. Thoracentesis was  performed. The catheter was removed and a dressing applied. FINDINGS: A total of approximately 500 mL of clear yellow fluid was removed. IMPRESSION: Successful ultrasound guided RIGHT thoracentesis yielding 500 mL of pleural fluid. Procedure performed by: Warren Dais, NP under supervision of Thom Hall, MD Electronically Signed   By: Thom Hall M.D.   On: 11/01/2024 11:31   DG Chest 1 View Result Date: 11/01/2024 CLINICAL DATA:  evaluate for fluid on lung EXAM: PORTABLE CHEST 1 VIEW COMPARISON:  Chest XR, 10/07/2024.  CT chest, 10/05/2024. FINDINGS: Support lines; RIGHT chest port, well-positioned with catheter tip at the superior cavoatrial junction. Cardiac silhouette is within normal limits. No RIGHT superior mediastinal mass, unchanged. The LEFT lung is relatively well inflated, with a small persistent hazy retrocardiac opacity. Small volume RIGHT pleural  effusion, with dependent basilar consolidation silhouetting the hemidiaphragm. No pneumothorax. Surgical clips projecting at the epigastrium and RIGHT upper quadrant. No acute osseous abnormality. IMPRESSION: 1. Small volume RIGHT pleural effusion. 2. RIGHT basilar dependent consolidation is likely to represent atelectasis, however superimposed pneumonia can appear similar. Electronically Signed   By: Thom Hall M.D.   On: 11/01/2024 10:21   DG Chest 1 View Result Date: 11/01/2024 CLINICAL DATA:  [id number] S/P thoracentesis [id number]. RIGHT thoracentesis. Prior RIGHT PleurX catheter, placed 10/04/2024 EXAM: PORTABLE CHEST 1 VIEW COMPARISON:  Chest XR, 10/31/2024. IR thoracentesis, earlier same day. FINDINGS: Support lines; RIGHT chest port, well-positioned with catheter tip at the superior cavoatrial junction. Cardiac silhouette is within normal limits. Known RIGHT superior mediastinal mass, unchanged. The LEFT lung is well inflated, with a small persistent retrocardiac hazy opacity. Improved aeration of the RIGHT lung post thoracentesis without  residual pleural effusion. No pneumothorax. No interval osseous abnormality. IMPRESSION: 1. Improved aeration of the RIGHT lung post thoracentesis, without residual pleural effusion. No pneumothorax. 2. Known RIGHT superior mediastinal/apical lung mass, unchanged. Electronically Signed   By: Thom Hall M.D.   On: 11/01/2024 09:46   VAS US  LOWER EXTREMITY VENOUS (DVT) (7a-5p) Result Date: 10/29/2024  Lower Venous DVT Study Patient Name:  KENYETTA WIMBISH  Date of Exam:   10/28/2024 Medical Rec #: [id number]          Accession #:    [id number] Date of Birth: 12/01/1940          Patient Gender: F Patient Age:   89 years Exam Location:  [facility] Procedure:      VAS US  LOWER EXTREMITY VENOUS (DVT) Referring Phys: MAUDE MESSICK --------------------------------------------------------------------------------  Indications: Pain.  Risk Factors: Cancer: recently diagnosed metastatic adeno CA lung with recurrent malignant effusions. Anticoagulation: Xarelto . Comparison Study: No prior left LEV on file Performing Technologist: Alberta Lis RVS  Examination Guidelines: A complete evaluation includes B-mode imaging, spectral Doppler, color Doppler, and power Doppler as needed of all accessible portions of each vessel. Bilateral testing is considered an integral part of a complete examination. Limited examinations for reoccurring indications may be performed as noted. The reflux portion of the exam is performed with the patient in reverse Trendelenburg.  +-----+---------------+---------+-----------+----------+--------------+ RIGHTCompressibilityPhasicitySpontaneityPropertiesThrombus Aging +-----+---------------+---------+-----------+----------+--------------+ CFV  Full                                                        +-----+---------------+---------+-----------+----------+--------------+ SFJ  Full                                                         +-----+---------------+---------+-----------+----------+--------------+ GSV  None                                                        +-----+---------------+---------+-----------+----------+--------------+   +---------+---------------+---------+-----------+----------+-----------------+ LEFT     CompressibilityPhasicitySpontaneityPropertiesThrombus Aging    +---------+---------------+---------+-----------+----------+-----------------+ CFV      Full           Yes  No                                     +---------+---------------+---------+-----------+----------+-----------------+ SFJ      Full                                                           +---------+---------------+---------+-----------+----------+-----------------+ FV Prox  Full                                                           +---------+---------------+---------+-----------+----------+-----------------+ FV Mid   Full                                                           +---------+---------------+---------+-----------+----------+-----------------+ FV DistalFull                                                           +---------+---------------+---------+-----------+----------+-----------------+ PFV      Full                                                           +---------+---------------+---------+-----------+----------+-----------------+ POP      None           No       No                   Acute             +---------+---------------+---------+-----------+----------+-----------------+ PTV      None                                         Acute             +---------+---------------+---------+-----------+----------+-----------------+ PERO     None                                         Acute             +---------+---------------+---------+-----------+----------+-----------------+ Soleal   None           No       No                   Acute              +---------+---------------+---------+-----------+----------+-----------------+ Gastroc  Full                                                           +---------+---------------+---------+-----------+----------+-----------------+  GSV      None                                         Age Indeterminate +---------+---------------+---------+-----------+----------+-----------------+ SSV      Full                                                           +---------+---------------+---------+-----------+----------+-----------------+ VV                                                    Age Indeterminate +---------+---------------+---------+-----------+----------+-----------------+     Summary: RIGHT: - No evidence of common femoral vein obstruction.   LEFT: - Findings consistent with acute deep vein thrombosis involving the left popliteal vein, left posterior tibial veins, and left peroneal veins.  - Findings consistent with age indeterminate superficial vein thrombosis involving the left great saphenous vein, and left superficial veins/varicosities.   *See table(s) above for measurements and observations. Electronically signed by Fonda Rim on 10/29/2024 at 12:15:56 PM.    Final    MR LUMBAR SPINE WO CONTRAST Result Date: 10/28/2024 CLINICAL DATA:  Initial evaluation for low back pain, cancer suspected. EXAM: MRI LUMBAR SPINE WITHOUT CONTRAST TECHNIQUE: Multiplanar, multisequence MR imaging of the lumbar spine was performed. No intravenous contrast was administered. COMPARISON:  Prior radiograph from earlier the same day. FINDINGS: Segmentation: Standard segmentation. Lowest well-formed disc space labeled the L5-S1 level. Alignment: Moderate levoscoliosis with associated rotary component. Mild stepwise degenerative retrolisthesis of L1 on L2 through L3 on L4, with additional trace anterolisthesis of L5 on S1. Findings chronic and facet mediated. Vertebrae: Vertebral body height  maintained without acute or chronic fracture. Bone marrow signal intensity diffusely heterogeneous. No worrisome osseous lesions or evidence for metastatic disease. Mild degenerative reactive endplate changes present about the L3-4 interspace. No other abnormal marrow edema. Conus medullaris and cauda equina: Conus extends to the L1 level. Conus and cauda equina appear normal. Paraspinal and other soft tissues: Paraspinous soft tissues demonstrate no acute finding. The right kidney appears to be absent. Compensatory hypertrophy of the left kidney. 2.6 cm simple T2 hyperintense left renal cyst, benign in appearance, no follow-up recommended. Disc levels: T12-L1: Disc desiccation with mild disc bulge. Superimposed small right paracentral disc extrusion with inferior migration. Mild facet hypertrophy. No significant spinal stenosis. Foramina remain patent. L1-2: Degenerative intervertebral disc space narrowing with diffuse disc bulge, asymmetric to the right. Reactive endplate spurring. Superimposed right foraminal disc extrusion with superior migration (series 5, image 8). Either the right L1 or descending L2 nerve roots could be affected. Mild to moderate right greater left facet hypertrophy. Resultant moderate narrowing of the right lateral recess. Central canal remains patent. Foramina themselves remain patent as well. L2-3: Advanced degenerative vertebral disc space narrowing with disc desiccation diffuse disc bulge. Reactive endplate spurring. Mild to moderate bilateral facet arthrosis. Resultant mild narrowing of the right lateral recess. Central canal remains patent. No significant foraminal stenosis. L3-4: Degenerative intervertebral disc space narrowing with diffuse disc bulge and disc desiccation. Reactive endplate spurring, greater on the right. Moderate  facet hypertrophy, also greater on the right. Resultant mild canal with moderate bilateral subarticular stenosis. Mild right L3 foraminal narrowing. Left  neural foramen remains patent. L4-5: Degenerative intervertebral disc space narrowing with disc desiccation. Mild reactive endplate spurring. Bilateral facet hypertrophy. Sequelae of prior left hemi laminectomy. No significant residual spinal stenosis. Mild to moderate left L4 foraminal narrowing. Right neural foramen is patent. L5-S1: Minimal disc bulge with disc desiccation. Advanced left-sided facet arthrosis. No significant spinal stenosis. Foramina remain patent. IMPRESSION: 1. No evidence for metastatic disease within the lumbar spine. 2. Right foraminal disc extrusion with superior migration at L1-2. Either the right L1 or descending L2 nerve roots could be affected. 3. Multifactorial degenerative changes at L3-4 with resultant mild canal and moderate bilateral subarticular stenosis, with mild right L3 foraminal narrowing. 4. Sequelae of prior left hemi laminectomy at L4-5 without residual spinal stenosis. Mild to moderate left L4 foraminal narrowing at this level. Electronically Signed   By: Morene Hoard M.D.   On: 10/28/2024 20:09   DG Hip Unilat W or Wo Pelvis 2-3 Views Left Result Date: 10/28/2024 EXAM: 2 VIEW(S) XRAY OF THE UNILATERAL HIP 10/28/2024 12:57:00 PM COMPARISON: None available. CLINICAL HISTORY: pain FINDINGS: BONES AND JOINTS: No acute fracture, pelvic bone diastasis, or dislocation. No malalignment. Osteopenia. Mild bilateral hip osteoarthritis. SOFT TISSUES: Surgical clips overlying the right hemipelvis. Peripheral vascular atherosclerosis. LUMBAR SPINE: Degenerative changes of the visualized lower lumbar spine. IMPRESSION: 1. Osteopenia. No acute fracture, pelvic bone diastasis, or dislocation. 2. Mild bilateral hip osteoarthritis. Electronically signed by: Rogelia Myers MD 10/28/2024 01:06 PM EST RP Workstation: [id number]   DG Lumbar Spine Complete Result Date: 10/28/2024 EXAM: 4 VIEW(S) XRAY OF THE LUMBAR SPINE 10/28/2024 12:57:00 PM COMPARISON: 03/16/2024 and  12/10/2020. CLINICAL HISTORY: pain pain FINDINGS: LUMBAR SPINE: BONES: Vertebral body heights are maintained. Diffuse osteopenia. Moderate levocurvature of the lumbar spine, unchanged. DISCS AND DEGENERATIVE CHANGES: Advanced multilevel intervertebral disc height loss with large osteophyte formation throughout the lumbar spine. SOFT TISSUES: Diffuse aortoiliac atherosclerosis. IMPRESSION: 1. Diffuse osteopenia. No acute fracture or malalignment of the lumbar spine. 2. Moderate levocurvature of the lumbar spine with advanced multilevel degenerative disc disease, unchanged. Electronically signed by: Rogelia Myers MD 10/28/2024 01:05 PM EST RP Workstation: [id number]     ASSESSMENT/PLAN:  This is a very pleasant 84 year old Caucasian female with stage IV (T2b, N3, and 1A) non-small cell lung cancer, adenocarcinoma.  She presented with a right upper lobe lung mass in addition to right hilar, mediastinal, right supraclavicular lymphadenopathy as well as a malignant right pleural effusion.  She was diagnosed in October 2025.   His molecular studies show positive KRAS G 12C mutation and PD-L1 expression 40%.   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   The patient is currently undergoing palliative systemic chemotherapy and immunotherapy with carboplatin  for an AUC of 5, paclitaxel , and Keytruda  with Neulasta  support IV every 3 weeks.  First dose on 10/16/2024.  She status post 2 cycles.  Her dose of chemotherapy was reduced starting from cycle #2 to carboplatin  for an AUC of 4, paclitaxel  150 mg/m, Keytruda  200 mg IV every 3 weeks.  Labs were reviewed.  Recommend that she proceed with cycle #3 today scheduled at the same dose (reduced dose).  I will arrange for restaging CT scan of the chest, abdomen, pelvis prior to her next cycle of treatment. I ordered this without contrast due to her CKD. She is ok to  treat with creatinine of 1.62 today and BP  95/73. We will arrange for additional 1 L of fluids over 2 hours today.   I will place standing orders for weekly sample blood bank should she require any transfusion support. We would consider her for blood transfusion if her Hbg is <8. Her Hbg improved slightly today.   - Coordinate with palliative care for symptom management, including pain and anxiety.   -I will add standing orders for cbc and sample to blood bank   - Encouraged nutritional support and hydration; offered nutritionist referral, declined.    Pleural effusion - Ordered CXR today to assess effusion. Her oxygen is 100%.   - Provided anticipatory guidance to call clinic early if additional IV fluids are needed before next appointment.   We will see her back for follow-up visit in 3 weeks before going cycle #4.  The patient was advised to call immediately if she has any concerning symptoms in the interval. The patient voices understanding of current disease status and treatment options and is in agreement with the current care plan. All questions were answered. The patient knows to call the clinic with any problems, questions or concerns. We can certainly see the patient much sooner if necessary   Orders Placed This Encounter  Procedures   CT CHEST ABDOMEN PELVIS WO CONTRAST    Standing Status:   Future    Expected Date:   12/11/2024    Expiration Date:   11/27/2025    Preferred imaging location?:   [facility]    If indicated for the ordered procedure, I authorize the administration of oral contrast media per Radiology protocol:   No    Reason for no oral contrast::   CKD   DG Chest 2 View    Standing Status:   Future    Expected Date:   11/27/2024    Expiration Date:   11/27/2025    Reason for Exam (SYMPTOM  OR DIAGNOSIS REQUIRED):   assess pleural effusion    Preferred imaging location?:   [facility]     The total time spent in the appointment was 20-29 minutes  Loreto Loescher L Raynesha Tiedt,  PA-C 11/27/24

## 2024-11-22 ENCOUNTER — Other Ambulatory Visit: Payer: Self-pay

## 2024-11-22 ENCOUNTER — Other Ambulatory Visit: Payer: Self-pay | Admitting: Internal Medicine

## 2024-11-27 ENCOUNTER — Inpatient Hospital Stay: Admitting: Physician Assistant

## 2024-11-27 ENCOUNTER — Inpatient Hospital Stay

## 2024-11-27 ENCOUNTER — Ambulatory Visit (HOSPITAL_COMMUNITY)
Admission: RE | Admit: 2024-11-27 | Discharge: 2024-11-27 | Disposition: A | Discharge status: Home / Self Care | Source: Ambulatory Visit | Attending: Physician Assistant | Admitting: Physician Assistant

## 2024-11-27 ENCOUNTER — Encounter: Payer: Self-pay | Admitting: Nurse Practitioner

## 2024-11-27 ENCOUNTER — Inpatient Hospital Stay: Admitting: Nurse Practitioner

## 2024-11-27 ENCOUNTER — Encounter: Payer: Self-pay | Admitting: Internal Medicine

## 2024-11-27 VITALS — BP 97/73 | HR 73 | Temp 97.6°F | Resp 16 | Ht 66.0 in | Wt 139.8 lb

## 2024-11-27 DIAGNOSIS — R530 Neoplastic (malignant) related fatigue: Secondary | ICD-10-CM

## 2024-11-27 DIAGNOSIS — Z5112 Encounter for antineoplastic immunotherapy: Secondary | ICD-10-CM | POA: Insufficient documentation

## 2024-11-27 DIAGNOSIS — Z5111 Encounter for antineoplastic chemotherapy: Secondary | ICD-10-CM | POA: Diagnosis not present

## 2024-11-27 DIAGNOSIS — C3411 Malignant neoplasm of upper lobe, right bronchus or lung: Secondary | ICD-10-CM

## 2024-11-27 DIAGNOSIS — M792 Neuralgia and neuritis, unspecified: Secondary | ICD-10-CM

## 2024-11-27 DIAGNOSIS — R630 Anorexia: Secondary | ICD-10-CM

## 2024-11-27 DIAGNOSIS — G4709 Other insomnia: Secondary | ICD-10-CM

## 2024-11-27 DIAGNOSIS — I959 Hypotension, unspecified: Secondary | ICD-10-CM

## 2024-11-27 DIAGNOSIS — F419 Anxiety disorder, unspecified: Secondary | ICD-10-CM

## 2024-11-27 DIAGNOSIS — Z515 Encounter for palliative care: Secondary | ICD-10-CM

## 2024-11-27 LAB — CBC WITH DIFFERENTIAL (CANCER CENTER ONLY)
Abs Immature Granulocytes: 0.05 K/uL (ref 0.00–0.07)
Basophils Absolute: 0.1 K/uL (ref 0.0–0.1)
Basophils Relative: 1 %
Eosinophils Absolute: 0 K/uL (ref 0.0–0.5)
Eosinophils Relative: 0 %
HCT: 32.2 % — ABNORMAL LOW (ref 36.0–46.0)
Hemoglobin: 10.4 g/dL — ABNORMAL LOW (ref 12.0–15.0)
Immature Granulocytes: 0 %
Lymphocytes Relative: 7 %
Lymphs Abs: 1 K/uL (ref 0.7–4.0)
MCH: 28.3 pg (ref 26.0–34.0)
MCHC: 32.3 g/dL (ref 30.0–36.0)
MCV: 87.5 fL (ref 80.0–100.0)
Monocytes Absolute: 1.1 K/uL — ABNORMAL HIGH (ref 0.1–1.0)
Monocytes Relative: 9 %
Neutro Abs: 11 K/uL — ABNORMAL HIGH (ref 1.7–7.7)
Neutrophils Relative %: 83 %
Platelet Count: 328 K/uL (ref 150–400)
RBC: 3.68 MIL/uL — ABNORMAL LOW (ref 3.87–5.11)
RDW: 20.4 % — ABNORMAL HIGH (ref 11.5–15.5)
WBC Count: 13.3 K/uL — ABNORMAL HIGH (ref 4.0–10.5)
nRBC: 0 % (ref 0.0–0.2)

## 2024-11-27 LAB — CMP (CANCER CENTER ONLY)
ALT: 20 U/L (ref 0–44)
AST: 18 U/L (ref 15–41)
Albumin: 3.7 g/dL (ref 3.5–5.0)
Alkaline Phosphatase: 206 U/L — ABNORMAL HIGH (ref 38–126)
Anion gap: 14 (ref 5–15)
BUN: 25 mg/dL — ABNORMAL HIGH (ref 8–23)
CO2: 23 mmol/L (ref 22–32)
Calcium: 9.9 mg/dL (ref 8.9–10.3)
Chloride: 104 mmol/L (ref 98–111)
Creatinine: 1.62 mg/dL — ABNORMAL HIGH (ref 0.44–1.00)
GFR, Estimated: 31 mL/min — ABNORMAL LOW
Glucose, Bld: 108 mg/dL — ABNORMAL HIGH (ref 70–99)
Potassium: 4.2 mmol/L (ref 3.5–5.1)
Sodium: 141 mmol/L (ref 135–145)
Total Bilirubin: 0.4 mg/dL (ref 0.0–1.2)
Total Protein: 7.2 g/dL (ref 6.5–8.1)

## 2024-11-27 LAB — SAMPLE TO BLOOD BANK

## 2024-11-27 MED ORDER — SODIUM CHLORIDE 0.9 % IV SOLN: INTRAVENOUS | Status: DC

## 2024-11-27 MED ORDER — SODIUM CHLORIDE 0.9 % IV SOLN
192.4000 mg | Freq: Once | INTRAVENOUS | Status: AC
  Administered 2024-11-27: 190 mg via INTRAVENOUS
  Filled 2024-11-27: qty 19

## 2024-11-27 MED ORDER — PALONOSETRON HCL INJECTION 0.25 MG/5ML
0.2500 mg | Freq: Once | INTRAVENOUS | Status: AC
  Administered 2024-11-27: 0.25 mg via INTRAVENOUS
  Filled 2024-11-27: qty 5

## 2024-11-27 MED ORDER — DIPHENHYDRAMINE HCL 50 MG/ML IJ SOLN
25.0000 mg | Freq: Once | INTRAMUSCULAR | Status: AC
  Administered 2024-11-27: 25 mg via INTRAVENOUS
  Filled 2024-11-27: qty 1

## 2024-11-27 MED ORDER — FAMOTIDINE IN NACL 20-0.9 MG/50ML-% IV SOLN
20.0000 mg | Freq: Once | INTRAVENOUS | Status: AC
  Administered 2024-11-27: 20 mg via INTRAVENOUS
  Filled 2024-11-27: qty 50

## 2024-11-27 MED ORDER — SODIUM CHLORIDE 0.9 % IV SOLN: Freq: Once | INTRAVENOUS | Status: AC

## 2024-11-27 MED ORDER — SODIUM CHLORIDE 0.9 % IV SOLN
200.0000 mg | Freq: Once | INTRAVENOUS | Status: AC
  Administered 2024-11-27: 200 mg via INTRAVENOUS
  Filled 2024-11-27: qty 200

## 2024-11-27 MED ORDER — SODIUM CHLORIDE 0.9% FLUSH: 10.0000 mL | INTRAVENOUS | Status: DC | PRN

## 2024-11-27 MED ORDER — ALPRAZOLAM 1 MG PO TABS: 1.0000 mg | ORAL_TABLET | Freq: Every day | ORAL | 0 refills | Status: AC

## 2024-11-27 MED ORDER — SODIUM CHLORIDE 0.9 % IV SOLN
150.0000 mg/m2 | Freq: Once | INTRAVENOUS | Status: AC
  Administered 2024-11-27: 258 mg via INTRAVENOUS
  Filled 2024-11-27: qty 43

## 2024-11-27 MED ORDER — DEXAMETHASONE SOD PHOSPHATE PF 10 MG/ML IJ SOLN
10.0000 mg | Freq: Once | INTRAMUSCULAR | Status: AC
  Administered 2024-11-27: 10 mg via INTRAVENOUS
  Filled 2024-11-27: qty 1

## 2024-11-27 MED ORDER — APREPITANT 130 MG/18ML IV EMUL
130.0000 mg | Freq: Once | INTRAVENOUS | Status: AC
  Administered 2024-11-27: 130 mg via INTRAVENOUS
  Filled 2024-11-27: qty 18

## 2024-11-27 NOTE — Patient Instructions (Signed)
 [facility] - A DEPT OF [facility]  Discharge Instructions: Thank you for choosing [facility] to provide your oncology and hematology care.   If you have a lab appointment with the Cancer Center, please go directly to the Cancer Center and check in at the registration area.   Wear comfortable clothing and clothing appropriate for easy access to any Portacath or PICC line.   We strive to give you quality time with your provider. You may need to reschedule your appointment if you arrive late (15 or more minutes).  Arriving late affects you and other patients whose appointments are after yours.  Also, if you miss three or more appointments without notifying the office, you may be dismissed from the clinic at the providers discretion.      For prescription refill requests, have your pharmacy contact our office and allow 72 hours for refills to be completed.    Today you received the following chemotherapy and/or immunotherapy agents keytruda  taxol  carboplatin       To help prevent nausea and vomiting after your treatment, we encourage you to take your nausea medication as directed.  BELOW ARE SYMPTOMS THAT SHOULD BE REPORTED IMMEDIATELY: *FEVER GREATER THAN 100.4 F (38 C) OR HIGHER *CHILLS OR SWEATING *NAUSEA AND VOMITING THAT IS NOT CONTROLLED WITH YOUR NAUSEA MEDICATION *UNUSUAL SHORTNESS OF BREATH *UNUSUAL BRUISING OR BLEEDING *URINARY PROBLEMS (pain or burning when urinating, or frequent urination) *BOWEL PROBLEMS (unusual diarrhea, constipation, pain near the anus) TENDERNESS IN MOUTH AND THROAT WITH OR WITHOUT PRESENCE OF ULCERS (sore throat, sores in mouth, or a toothache) UNUSUAL RASH, SWELLING OR PAIN  UNUSUAL VAGINAL DISCHARGE OR ITCHING   Items with * indicate a potential emergency and should be followed up as soon as possible or go to the Emergency Department if any problems should occur.  Please show the CHEMOTHERAPY ALERT CARD  or IMMUNOTHERAPY ALERT CARD at check-in to the Emergency Department and triage nurse.  Should you have questions after your visit or need to cancel or reschedule your appointment, please contact [facility] - A DEPT OF [facility]  Dept: [phone]  and follow the prompts.  Office hours are 8:00 a.m. to 4:30 p.m. Monday - Friday. Please note that voicemails left after 4:00 p.m. may not be returned until the following business day.  We are closed weekends and major holidays. You have access to a nurse at all times for urgent questions. Please call the main number to the clinic Dept: [phone] and follow the prompts.   For any non-urgent questions, you may also contact your provider using MyChart. We now offer e-Visits for anyone 42 and older to request care online for non-urgent symptoms. For details visit [email or web address].   Also download the MyChart app! Go to the app store, search MyChart, open the app, select [place], and log in with your MyChart username and password.

## 2024-11-27 NOTE — Progress Notes (Signed)
 "    Palliative Medicine [facility]  [phone] [phone]   Name: Carolyn Wells Date: 11/27/2024 MRN: [id number]  DOB: 07-Aug-1941  Patient Care Team: [name], MD as PCP - General (Internal Medicine) Kriste Emeline BRAVO, DO as PCP - Cardiology (Cardiology) Abran Norleen SAILOR, MD as Consulting Physician (Gastroenterology) Prentis Duwaine BROCKS, RN as Oncology Nurse Navigator Pickenpack-Cousar, Fannie SAILOR, NP as Nurse Practitioner [facility] and Palliative Medicine)    INTERVAL HISTORY: Carolyn Wells is a 84 y.o. female with oncologic medical history including recently diagnosed lung cancer (08/2024) with mediastinal, right hilar, right supraclavicular, and right axillary adenopathy consistent with metastatic disease. Palliative is seeing patient for symptom management and goals of care.   SOCIAL HISTORY:     reports that she has never smoked. She has never been exposed to tobacco smoke. She has never used smokeless tobacco. She reports that she does not drink alcohol and does not use drugs.  ADVANCE DIRECTIVES:  Patient has a documented directive. Her daughter is her clinical research associate. No life sustaining treatment including artificial feedings.   CODE STATUS: DNR  PAST MEDICAL HISTORY: Past Medical History:  Diagnosis Date   ABDOMINAL INCISIONAL HERNIA 08/13/2009   s/p repair   Allergic rhinitis, cause unspecified 09/28/2011   BRADYCARDIA 09/26/2010   CKD (chronic kidney disease), stage III (HCC) 02/26/2015   Solitary kidney   COLONIC POLYPS, HX OF 08/25/2007   Complication of anesthesia    DIVERTICULOSIS, COLON 08/25/2007   Dysrhythmia    GASTROESOPHAGEAL REFLUX DISEASE 08/04/2007   HYPERLIPIDEMIA 09/27/2010   not on Rx therapy   HYPERTENSION 08/06/2007   Hypothyroidism    INTERNAL HEMORRHOIDS 08/04/2007   LOW BACK PAIN 08/06/2007   scoliosis; DJD; gets injections   Lung cancer (HCC)    OSTEOPENIA 08/04/2007   PAF (paroxysmal  atrial fibrillation) (HCC)    a.  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    ALLERGIES:  is allergic to budesonide, elavil  [amitriptyline ], gabapentin , tramadol , hydrocodone, and ipratropium.  MEDICATIONS:  Current Outpatient Medications  Medication Sig Dispense Refill   acetaminophen  (TYLENOL ) 500 MG tablet Take 500 mg by mouth daily as needed for moderate pain (pain score 4-6) or mild pain (pain score 1-3).     ALPRAZolam  (XANAX ) 1 MG tablet Take 1 tablet (1 mg total) by mouth at bedtime. 30 tablet 0   amiodarone  (PACERONE ) 200 MG tablet Take 200 mg by mouth daily.     Ascorbic Acid (VITAMIN C) 1000 MG tablet Take 1,000 mg by mouth daily.     furosemide  (LASIX ) 20 MG tablet Take 1 tablet (20 mg total) by mouth daily. Take extra dose as needed for weight gain of 3 LB in 1 day, 5 LB in 1 week 60 tablet 0   levothyroxine  (SYNTHROID ) 50 MCG tablet TAKE 1 TABLET BY MOUTH ONCE DAILY BEFORE BREAKFAST . APPOINTMENT REQUIRED FOR FUTURE REFILLS 90 tablet 0   lidocaine -prilocaine  (EMLA ) cream Apply topically once.     LUMIGAN 0.01 % SOLN Place 1 drop into both eyes at bedtime.      metoprolol  succinate (TOPROL -XL) 25 MG 24 hr tablet Take 2 tablets (50 mg total) by mouth daily. 180 tablet 1   metoprolol  tartrate (LOPRESSOR ) 25 MG tablet Take 75 mg by mouth  daily.     Multiple Vitamins-Minerals (PRESERVISION AREDS 2 PO) Take 1 tablet by mouth daily.     Multiple Vitamins-Minerals (WOMENS 50+ MULTI VITAMIN/MIN PO) Take 1 tablet by mouth daily.     Omega-3 Fatty Acids (FISH OIL TRIPLE STRENGTH) 1400 MG CAPS Take 1,400 mg by mouth daily.     ondansetron  (ZOFRAN ) 8 MG tablet Take 1 tablet (8 mg total) by mouth every 8 (eight) hours as needed for nausea or vomiting. Start on the third day after carboplatin . 30 tablet 1    pantoprazole  (PROTONIX ) 40 MG tablet Take 1 tablet (40 mg total) by mouth daily. TAKE 1 TABLET BY MOUTH ONCE DAILY (Patient taking differently: Take 40 mg by mouth daily.) 90 tablet 3   Perfluorohexyloctane (MIEBO) 1.338 GM/ML SOLN Place 1.338 mLs into both eyes 4 (four) times daily.     potassium chloride  (KLOR-CON  M) 10 MEQ tablet Take 2 tablets (20 mEq total) by mouth every other day. 60 tablet 0   prochlorperazine  (COMPAZINE ) 10 MG tablet Take 1 tablet (10 mg total) by mouth every 6 (six) hours as needed for nausea or vomiting. 30 tablet 1   [MAR Hold] Rivaroxaban  (XARELTO ) 15 MG TABS tablet Take 1 tablet (15 mg total) by mouth daily with supper.     vitamin k 100 MCG tablet Take 100 mcg by mouth daily.     No current facility-administered medications for this visit.   Facility-Administered Medications Ordered in Other Visits  Medication Dose Route Frequency Provider Last Rate Last Admin   0.9 %  sodium chloride  infusion   Intravenous Continuous Sherrod Sherrod, MD 400 mL/hr at 11/27/24 1206 Infusion Verify at 11/27/24 1206   CARBOplatin  (PARAPLATIN ) 190 mg in sodium chloride  0.9 % 100 mL chemo infusion  190 mg Intravenous Once Mohamed, Mohamed, MD       PACLitaxel  (TAXOL ) 258 mg in sodium chloride  0.9 % 250 mL chemo infusion (> 80mg /m2)  150 mg/m2 (Treatment Plan Recorded) Intravenous Once Mohamed, Mohamed, MD 98 mL/hr at 11/27/24 1209 258 mg at 11/27/24 1209   sodium chloride  flush (NS) 0.9 % injection 10 mL  10 mL Intracatheter PRN Sherrod Sherrod, MD        VITAL SIGNS: There were no vitals taken for this visit. There were no vitals filed for this visit.  Estimated body mass index is 22.56 kg/m as calculated from the following:   Height as of an earlier encounter on 11/27/24: 5' 6 (1.676 m).   Weight as of an earlier encounter on 11/27/24: 139 lb 12.8 oz (63.4 kg).   PERFORMANCE STATUS (ECOG) : 2 - Symptomatic, <50% confined to bed  Physical Exam General: NAD  Pulmonary: normal  breathing pattern  Extremities: no edema, no joint deformities Skin: no rashes Neurological: AAO x3  IMPRESSION: Discussed the use of AI scribe software for clinical note transcription with the patient, who gave verbal consent to proceed.  History of Present Illness Carolyn Wells is an 84 year old female with metastatic lung cancer who was seen during infusion for symptom management follow-up.  She is accompanied by her daughter.  Denies concerns of uncontrolled nausea, vomiting, constipation, or diarrhea.  She has not been eating or drinking much, and her caregiver noted a lack of appetite. She is receiving fluids to address concerns for dehydration.  Her weight has remained relatively stable, with a slight increase from 138 lbs on January 5 to 139 lbs today.   She underwent a thoracentesis on the right side November 14, 2024, which yielded 900 mL. Her hemoglobin level is 10.4, which is an improvement from previous levels.  She is experiencing pain primarily in the sciatic area, including the leg and hip. Gabapentin  was tried for pain management, but it caused wooziness even at the lowest dose.  She is currently managing with Tylenol  as needed. Carolyn Wells is quite sensitive to medications and at this time her daughter and I have decided not to pursue any further pain medications.  Education provided on use of topical creams with hopes of decreasing some of her discomfort for now.  Her current medications include Xanax , which she is doing okay with and requires a refill, Xarelto  with only three days left, and metoprolol , which she takes twice a day. There is a concern about her low blood pressure, which has been running around 95-100/60-70. She has contacted Cardiology to discuss further adjustments to medications and obtain refills.   All questions answered and support provided.   Assessment & Plan Neoplasm-related pain Pain primarily in the sciatic area. Gabapentin  caused significant  drowsiness. Considering topical treatment for neuropathy and sciatic pain. -Consider topical creams for some local pain management. - Continue Tylenol  and Orthopush for pain management.  Dehydration/Decreased appetite  Likely due to inadequate oral intake. Received fluids today per Oncology. Current weight is stable at 139 pounds. - Continue to monitor hydration status and provide fluids as needed. - Focus on small frequent meals versus large meals.  Anemia in neoplastic disease Hemoglobin improved to 10.4 from 10.0. No immediate need for blood transfusion. - Continue to monitor hemoglobin levels and provide supportive care.  Hypotension Blood pressure readings low at 95/60 to 97/73. Current metoprolol  or amlodipine  dosage may need adjustment. -Daughter has sent a message to Dr. Kriste regarding blood pressure and potential adjustment.  Anxiety disorder Currently managed with Xanax . Requires refill. - Sent refill for Xanax .  Insomnia Managed with Xanax . - Continue current management with Xanax .  I will plan to see patient back in 2-3 weeks. Sooner if needed.    Patient expressed understanding and was in agreement with this plan. She also understands that She can call the clinic at any time with any questions, concerns, or complaints.   Any controlled substances utilized were prescribed in the context of palliative care. PDMP has been reviewed.   I personally spent a total of 30 minutes in the care of the patient today including preparing to see the patient, getting/reviewing separately obtained history, performing a medically appropriate exam/evaluation, counseling and educating, placing orders, and documenting clinical information in the EHR. Visit consisted of counseling and education dealing with the complex and emotionally intense issues of symptom management and palliative care in the setting of serious and potentially life-threatening illness.  Levon Borer, AGPCNP-BC   Palliative Medicine Team/[facility]            "

## 2024-11-28 ENCOUNTER — Encounter: Payer: Self-pay | Admitting: Internal Medicine

## 2024-11-28 ENCOUNTER — Telehealth: Payer: Self-pay

## 2024-11-28 ENCOUNTER — Telehealth: Payer: Self-pay | Admitting: Physician Assistant

## 2024-11-28 NOTE — Telephone Encounter (Signed)
 Scheduled appointments per 1/19 los. Called and left a VM with appointment details for the patient.

## 2024-11-28 NOTE — Telephone Encounter (Signed)
 Spoke with patients daughter regarding chest X-ray results. Per Cassie, PA, pleural fluid is now mild to moderate in amount and has slightly increased compared to the previous chest X-ray. Daughter reported that the patient is actually feeling better and had a good night last night.  Daughter was advised to continue monitoring the patients symptoms and to contact our office if symptoms worsen, at which time a thoracentesis can be scheduled if needed. Daughter was also informed that the chest X-ray showed some vascular congestion consistent with heart failure/heart disease.  Daughter reported that the patient had been taking Lasix  twice daily and is now taking one tablet daily. She was advised to monitor the patients daily weight and swelling and to contact the cardiologist if there is increased weight gain or edema. Daughter also stated that the patient had been taking metoprolol  twice daily and is now reduced to once daily due to low blood pressure; cardiology is aware of this change.  Daughter also inquired about a refill of Xarelto  and stated the patient has two pills remaining. She was advised to contact the cardiologists office to request a refill. Daughter voiced understanding and was instructed to call if any symptoms worsen.  Also, scheduled patient for a weekly port flush on 12/11/24 prior to her CT scan.  Advised daughter that more weekly port flushes can be made at infusion appt on 01/08/25.

## 2024-11-28 NOTE — Telephone Encounter (Signed)
 I do not see why the Xarelto  is on hold but she can restart it from a cardio standpoint.  She can decrease the metoprolol  to 25 mg daily from 50 mg  Continue with the Lasix   Ok to refill the potassium.    Thank you, Emeline FORBES Calender, DO

## 2024-11-29 ENCOUNTER — Inpatient Hospital Stay

## 2024-11-29 VITALS — BP 120/84 | HR 108 | Temp 97.8°F | Resp 16

## 2024-11-29 DIAGNOSIS — Z5111 Encounter for antineoplastic chemotherapy: Secondary | ICD-10-CM | POA: Diagnosis not present

## 2024-11-29 DIAGNOSIS — C3411 Malignant neoplasm of upper lobe, right bronchus or lung: Secondary | ICD-10-CM

## 2024-11-29 MED ORDER — METOPROLOL SUCCINATE ER 25 MG PO TB24: 25.0000 mg | ORAL_TABLET | Freq: Every day | ORAL | 1 refills | Status: AC

## 2024-11-29 MED ORDER — RIVAROXABAN 15 MG PO TABS: 15.0000 mg | ORAL_TABLET | Freq: Every day | ORAL | 1 refills | Status: AC

## 2024-11-29 MED ORDER — PEGFILGRASTIM-JMDB 6 MG/0.6ML ~~LOC~~ SOSY
6.0000 mg | PREFILLED_SYRINGE | Freq: Once | SUBCUTANEOUS | Status: AC
  Administered 2024-11-29: 6 mg via SUBCUTANEOUS
  Filled 2024-11-29: qty 0.6

## 2024-11-29 MED ORDER — POTASSIUM CHLORIDE CRYS ER 10 MEQ PO TBCR: 20.0000 meq | EXTENDED_RELEASE_TABLET | ORAL | 1 refills | Status: AC

## 2024-11-29 NOTE — Addendum Note (Signed)
 Addended by: MEMORY DELON POUR on: 11/29/2024 07:24 AM   Modules accepted: Orders

## 2024-11-29 NOTE — Telephone Encounter (Signed)
 Prescription refill request for Xarelto  received.  Indication: AFIB Last office visit: 10/12/24 Weight: 63.4 KG Age: 84 Scr: 1.62 (11/27/24) CrCl: 12   Per [email or web address], pt dose is correct.  Refill for Xarelto  15 mg daily has been sent.

## 2024-11-30 ENCOUNTER — Telehealth: Payer: Self-pay

## 2024-11-30 NOTE — Telephone Encounter (Signed)
Has been addressed in other encounter.

## 2024-11-30 NOTE — Patient Instructions (Signed)
 Carolyn Wells - I am sorry I was unable to reach you today for our scheduled appointment. I work with [name], MD and am calling to support your healthcare needs. Please contact me at [phone] at your earliest convenience. I look forward to speaking with you soon.   Thank you,   Heddy Shutter, RN, MSN, BSN, CCM [facility], Population Health Case Manager Phone: [phone]

## 2024-12-04 ENCOUNTER — Telehealth: Payer: Self-pay

## 2024-12-04 ENCOUNTER — Inpatient Hospital Stay

## 2024-12-04 NOTE — Progress Notes (Unsigned)
 Complex Care Management Note Care Guide Note  12/04/2024 Name: RAYNI NEMITZ MRN: [id number] DOB: 1941-10-10   Complex Care Management Outreach Attempts: An unsuccessful telephone outreach was attempted today to offer the patient information about available complex care management services.  Follow Up Plan:  Additional outreach attempts will be made to offer the patient complex care management information and services.   Encounter Outcome:  No Answer-No voicemail  Leotis Rase [facility], [facility] Guide  Direct Dial: [phone]  Fax [phone]

## 2024-12-05 NOTE — Progress Notes (Signed)
 Complex Care Management Note  Care Guide Note 12/05/2024 Name: SEMAJA LYMON MRN: [id number] DOB: [birth year]  DANNE SCARDINA is a 84 y.o. year old female who sees [name], MD for primary care. I reached out to Lennart [name] by phone today to offer complex care management services.  Ms. Salsberry was given information about Complex Care Management services today including:   The Complex Care Management services include support from the care team which includes your Nurse Care Manager, Clinical Social Worker, or Pharmacist.  The Complex Care Management team is here to help remove barriers to the health concerns and goals most important to you. Complex Care Management services are voluntary, and the patient may decline or stop services at any time by request to their care team member.   Complex Care Management Consent Status: Patient agreed to services and verbal consent obtained.   Follow up plan:  12/19/24 @ 11 AM  Encounter Outcome:  Patient Scheduled  Leotis Rase [facility], [facility] Guide  Direct Dial: [phone]  Fax [phone]

## 2024-12-06 ENCOUNTER — Other Ambulatory Visit: Payer: Self-pay | Admitting: Physician Assistant

## 2024-12-06 ENCOUNTER — Telehealth: Payer: Self-pay

## 2024-12-06 ENCOUNTER — Inpatient Hospital Stay

## 2024-12-06 DIAGNOSIS — C3411 Malignant neoplasm of upper lobe, right bronchus or lung: Secondary | ICD-10-CM

## 2024-12-06 DIAGNOSIS — Z5111 Encounter for antineoplastic chemotherapy: Secondary | ICD-10-CM | POA: Diagnosis not present

## 2024-12-06 DIAGNOSIS — J90 Pleural effusion, not elsewhere classified: Secondary | ICD-10-CM

## 2024-12-06 LAB — CMP (CANCER CENTER ONLY)
ALT: 21 U/L (ref 0–44)
AST: 18 U/L (ref 15–41)
Albumin: 3.6 g/dL (ref 3.5–5.0)
Alkaline Phosphatase: 222 U/L — ABNORMAL HIGH (ref 38–126)
Anion gap: 13 (ref 5–15)
BUN: 27 mg/dL — ABNORMAL HIGH (ref 8–23)
CO2: 24 mmol/L (ref 22–32)
Calcium: 9.3 mg/dL (ref 8.9–10.3)
Chloride: 101 mmol/L (ref 98–111)
Creatinine: 1.42 mg/dL — ABNORMAL HIGH (ref 0.44–1.00)
GFR, Estimated: 36 mL/min — ABNORMAL LOW
Glucose, Bld: 122 mg/dL — ABNORMAL HIGH (ref 70–99)
Potassium: 4.4 mmol/L (ref 3.5–5.1)
Sodium: 138 mmol/L (ref 135–145)
Total Bilirubin: 0.3 mg/dL (ref 0.0–1.2)
Total Protein: 6.7 g/dL (ref 6.5–8.1)

## 2024-12-06 LAB — CBC WITH DIFFERENTIAL (CANCER CENTER ONLY)
Abs Immature Granulocytes: 0.78 10*3/uL — ABNORMAL HIGH (ref 0.00–0.07)
Basophils Absolute: 0.1 10*3/uL (ref 0.0–0.1)
Basophils Relative: 0 %
Eosinophils Absolute: 0 10*3/uL (ref 0.0–0.5)
Eosinophils Relative: 0 %
HCT: 29.1 % — ABNORMAL LOW (ref 36.0–46.0)
Hemoglobin: 9.4 g/dL — ABNORMAL LOW (ref 12.0–15.0)
Immature Granulocytes: 4 %
Lymphocytes Relative: 6 %
Lymphs Abs: 1.2 10*3/uL (ref 0.7–4.0)
MCH: 28.7 pg (ref 26.0–34.0)
MCHC: 32.3 g/dL (ref 30.0–36.0)
MCV: 89 fL (ref 80.0–100.0)
Monocytes Absolute: 1.9 10*3/uL — ABNORMAL HIGH (ref 0.1–1.0)
Monocytes Relative: 9 %
Neutro Abs: 16.7 10*3/uL — ABNORMAL HIGH (ref 1.7–7.7)
Neutrophils Relative %: 81 %
Platelet Count: 229 10*3/uL (ref 150–400)
RBC: 3.27 MIL/uL — ABNORMAL LOW (ref 3.87–5.11)
RDW: 22.5 % — ABNORMAL HIGH (ref 11.5–15.5)
Smear Review: NORMAL
WBC Count: 20.7 10*3/uL — ABNORMAL HIGH (ref 4.0–10.5)
nRBC: 0.4 % — ABNORMAL HIGH (ref 0.0–0.2)

## 2024-12-06 LAB — SAMPLE TO BLOOD BANK

## 2024-12-06 NOTE — Telephone Encounter (Signed)
 Spoke with patients daughter regarding MyChart message. Offered an appointment for thoracentesis for tomorrow at 10:30 AM. Daughter accepted the appointment and was instructed to arrive 15 minutes early. Daughter verbalized understanding.

## 2024-12-07 ENCOUNTER — Telehealth: Payer: Self-pay | Admitting: Internal Medicine

## 2024-12-07 ENCOUNTER — Ambulatory Visit (HOSPITAL_COMMUNITY)
Admission: RE | Admit: 2024-12-07 | Discharge: 2024-12-07 | Disposition: A | Discharge status: Home / Self Care | Source: Ambulatory Visit | Attending: Physician Assistant | Admitting: Physician Assistant

## 2024-12-07 ENCOUNTER — Other Ambulatory Visit: Payer: Self-pay | Admitting: Physician Assistant

## 2024-12-07 DIAGNOSIS — J90 Pleural effusion, not elsewhere classified: Secondary | ICD-10-CM

## 2024-12-07 DIAGNOSIS — C3411 Malignant neoplasm of upper lobe, right bronchus or lung: Secondary | ICD-10-CM

## 2024-12-07 DIAGNOSIS — I700 Atherosclerosis of aorta: Secondary | ICD-10-CM | POA: Insufficient documentation

## 2024-12-07 DIAGNOSIS — I2510 Atherosclerotic heart disease of native coronary artery without angina pectoris: Secondary | ICD-10-CM | POA: Insufficient documentation

## 2024-12-07 DIAGNOSIS — C771 Secondary and unspecified malignant neoplasm of intrathoracic lymph nodes: Secondary | ICD-10-CM | POA: Insufficient documentation

## 2024-12-07 MED ORDER — LIDOCAINE-EPINEPHRINE 1 %-1:100000 IJ SOLN
INTRAMUSCULAR | Status: AC
  Filled 2024-12-07: qty 20

## 2024-12-07 MED ORDER — LIDOCAINE-EPINEPHRINE 1 %-1:100000 IJ SOLN
20.0000 mL | Freq: Once | INTRAMUSCULAR | Status: AC
  Administered 2024-12-07: 4 mL via INTRADERMAL

## 2024-12-07 NOTE — Telephone Encounter (Signed)
 Called to reschedule the flush appt to later that week because of the chances of snow that's coming on the weekend

## 2024-12-07 NOTE — Procedures (Signed)
 Image Guided therapeutic right thoracentesis performed yielding 800 mL of straw colored fluid. No immediate complications. EBL: zero. Patient tolerated the procedure well. CT following right thoracentesis showed no evidence of pneumothorax.

## 2024-12-11 ENCOUNTER — Inpatient Hospital Stay

## 2024-12-11 ENCOUNTER — Ambulatory Visit (HOSPITAL_COMMUNITY)

## 2024-12-12 ENCOUNTER — Encounter: Payer: Self-pay | Admitting: Internal Medicine

## 2024-12-13 ENCOUNTER — Other Ambulatory Visit: Payer: Self-pay | Admitting: Medical Oncology

## 2024-12-13 ENCOUNTER — Telehealth: Payer: Self-pay | Admitting: Medical Oncology

## 2024-12-13 ENCOUNTER — Inpatient Hospital Stay: Attending: Internal Medicine

## 2024-12-13 ENCOUNTER — Inpatient Hospital Stay

## 2024-12-13 VITALS — BP 132/86 | HR 63 | Temp 97.4°F | Resp 16 | Wt 133.4 lb

## 2024-12-13 DIAGNOSIS — R112 Nausea with vomiting, unspecified: Secondary | ICD-10-CM

## 2024-12-13 DIAGNOSIS — C3411 Malignant neoplasm of upper lobe, right bronchus or lung: Secondary | ICD-10-CM

## 2024-12-13 LAB — CBC WITH DIFFERENTIAL (CANCER CENTER ONLY)
Abs Immature Granulocytes: 0.12 10*3/uL — ABNORMAL HIGH (ref 0.00–0.07)
Basophils Absolute: 0 10*3/uL (ref 0.0–0.1)
Basophils Relative: 0 %
Eosinophils Absolute: 0 10*3/uL (ref 0.0–0.5)
Eosinophils Relative: 0 %
HCT: 28.4 % — ABNORMAL LOW (ref 36.0–46.0)
Hemoglobin: 9.2 g/dL — ABNORMAL LOW (ref 12.0–15.0)
Immature Granulocytes: 1 %
Lymphocytes Relative: 4 %
Lymphs Abs: 0.9 10*3/uL (ref 0.7–4.0)
MCH: 29.2 pg (ref 26.0–34.0)
MCHC: 32.4 g/dL (ref 30.0–36.0)
MCV: 90.2 fL (ref 80.0–100.0)
Monocytes Absolute: 1.2 10*3/uL — ABNORMAL HIGH (ref 0.1–1.0)
Monocytes Relative: 6 %
Neutro Abs: 18.5 10*3/uL — ABNORMAL HIGH (ref 1.7–7.7)
Neutrophils Relative %: 89 %
Platelet Count: 193 10*3/uL (ref 150–400)
RBC: 3.15 MIL/uL — ABNORMAL LOW (ref 3.87–5.11)
RDW: 23.2 % — ABNORMAL HIGH (ref 11.5–15.5)
WBC Count: 20.8 10*3/uL — ABNORMAL HIGH (ref 4.0–10.5)
nRBC: 0 % (ref 0.0–0.2)

## 2024-12-13 LAB — CMP (CANCER CENTER ONLY)
ALT: 21 U/L (ref 0–44)
AST: 19 U/L (ref 15–41)
Albumin: 3.3 g/dL — ABNORMAL LOW (ref 3.5–5.0)
Alkaline Phosphatase: 236 U/L — ABNORMAL HIGH (ref 38–126)
Anion gap: 13 (ref 5–15)
BUN: 37 mg/dL — ABNORMAL HIGH (ref 8–23)
CO2: 23 mmol/L (ref 22–32)
Calcium: 9.3 mg/dL (ref 8.9–10.3)
Chloride: 104 mmol/L (ref 98–111)
Creatinine: 1.62 mg/dL — ABNORMAL HIGH (ref 0.44–1.00)
GFR, Estimated: 31 mL/min — ABNORMAL LOW
Glucose, Bld: 146 mg/dL — ABNORMAL HIGH (ref 70–99)
Potassium: 5 mmol/L (ref 3.5–5.1)
Sodium: 140 mmol/L (ref 135–145)
Total Bilirubin: 0.4 mg/dL (ref 0.0–1.2)
Total Protein: 6.7 g/dL (ref 6.5–8.1)

## 2024-12-13 LAB — SAMPLE TO BLOOD BANK

## 2024-12-13 MED ORDER — SODIUM CHLORIDE 0.9 % IV SOLN: INTRAVENOUS | Status: DC

## 2024-12-13 MED ORDER — PROCHLORPERAZINE MALEATE 10 MG PO TABS: 10.0000 mg | ORAL_TABLET | Freq: Once | ORAL | Status: DC

## 2024-12-13 MED ORDER — PROCHLORPERAZINE MALEATE 10 MG PO TABS
10.0000 mg | ORAL_TABLET | Freq: Once | ORAL | Status: AC
  Administered 2024-12-13: 10 mg via ORAL
  Filled 2024-12-13: qty 1

## 2024-12-13 NOTE — Telephone Encounter (Signed)
 Pt seen in infusion. She reports worsening pain in LLL and from hip to lower leg ( hx of sciatica per dtr). I palpated the mid calf on both legs . She winced when I pressed each leg. There is no redness on lower extremities and minimal swelling only on LLL.    I called lab results to [facility] and told her I will talk to [facility] tomorrow about doing another US  of L calf. She voiced understanding.

## 2024-12-13 NOTE — Progress Notes (Signed)
 Orders entered

## 2024-12-13 NOTE — Patient Instructions (Signed)
 Not Enough Water in the Body (Dehydration) in Adults: What to Know Dehydration is a condition in which there is not enough water or other fluids in the body. This happens when a person loses more fluids than they take in. Important organs cannot work right without the right amount of fluids. Any loss of fluids from the body can cause dehydration. Dehydration can be mild, worse, or very bad. It should be treated right away to keep it from getting very bad. What are the causes? Conditions that cause loss of water in the body. They include: Watery poop (diarrhea). Vomiting. Sweating a lot. Fever. Infection. Peeing (urinating) a lot. Not drinking enough fluids. Certain medicines, such as medicines that take extra fluid out of the body (diuretics). Lack of safe drinking water. Not being able to get enough water and food. What increases the risk? Having a long-term (chronic) illness that has not been treated the right way, such as: Diabetes. Heart disease. Kidney disease. Being 17 years of age or older. Having a disability. Living in a place that is high above the ground or sea (high in altitude). The thinner, drier air causes more fluid loss. Doing exercises that put stress on your body for a long time. Being active when in hot places. What are the signs or symptoms? Symptoms of dehydration depend on how bad it is. Mild or worse dehydration Thirst. Dry lips or dry mouth. Feeling dizzy or light-headed. Muscle cramps. Passing little pee or dark pee. Pee may be the color of tea. Headache. Very bad dehydration Changes in skin. Skin may: Be cold to the touch (clammy). Be blotchy or pale. Not go back to normal right after you pinch it and let it go. Little or no tears, pee, or sweat. Fast breathing. Low blood pressure. Weak pulse. Pulse that is more than 100 beats a minute when you are sitting still. Other changes, such as: Feeling very thirsty. Eyes that look hollow  (sunken). Cold hands and feet. Being confused. Being very tired (lethargic) or having trouble waking from sleep. Losing weight. Loss of consciousness. How is this treated? Treatment for this condition depends on how bad your dehydration is. Treatment should start right away. Do not wait until your condition gets very bad. Very bad dehydration is an emergency. You will need to go to a hospital. Mild or worse dehydration can be treated at home. You may be asked to: Drink more fluids. Drink an oral rehydration solution (ORS). This drink gives you the right amount of fluids, salts, and minerals (electrolytes). Very bad dehydration can be treated: With fluids through an IV tube. By correcting low levels of electrolytes in the body. By treating the problem that caused your dehydration. Follow these instructions at home: Oral rehydration solution If told by your doctor, drink an ORS: Make an ORS. Use instructions on the package. Start by drinking small amounts, about  cup (120 mL) every 5-10 minutes. Slowly drink more until you have had the amount that your doctor said to have.  Eating and drinking  Drink enough clear fluid to keep your pee pale yellow. If you were told to drink an ORS, finish the ORS first. Then, start slowly drinking other clear fluids. Drink fluids such as: Water. Do not drink only water. Doing that can make the salt (sodium) level in your body get too low. Water from ice chips you suck on. Fruit juice that you have added water to (diluted). Low-calorie sports drinks. Eat foods that have the right  amounts of salts and minerals, such as bananas, oranges, potatoes, tomatoes, or spinach. Do not drink alcohol . Avoid drinks that have caffeine or sugar. These include:: High-calorie sports drinks. Fruit juice that you did not add water to. Soda. Coffee or energy drinks. Avoid foods that are greasy or have a lot of fat or sugar. General instructions Take over-the-counter  and prescription medicines only as told by your doctor. Do not take sodium tablets. Doing that can make the salt level in your body get too high. Return to your normal activities as told by your doctor. Ask your doctor what activities are safe for you. Keep all follow-up visits. Your doctor may check and change your treatment. Contact a doctor if: You have pain in your belly (abdomen) and the pain: Gets worse. Stays in one place. You have a rash. You have a stiff neck. You get angry or annoyed more easily than normal. You are more tired or have a harder time waking than normal. You feel weak or dizzy. You feel very thirsty. Get help right away if: You have any symptoms of very bad dehydration. You vomit every time you eat or drink. Your vomiting gets worse, does not go away, or you vomit blood or green stuff. You are getting treatment, but symptoms are getting worse. You have a fever. You have a very bad headache. You have: Diarrhea that gets worse or does not go away. Blood in your poop (stool). This may cause poop to look black and tarry. No pee in 6-8 hours. Only a small amount of pee in 6-8 hours, and the pee is very dark. You have trouble breathing. These symptoms may be an emergency. Get help right away. Call 911. Do not wait to see if the symptoms will go away. Do not drive yourself to the hospital. This information is not intended to replace advice given to you by your health care provider. Make sure you discuss any questions you have with your health care provider. Document Revised: 09/01/2024 Document Reviewed: 05/25/2022 [employer] Patient Education  2025 [employer].

## 2024-12-15 ENCOUNTER — Other Ambulatory Visit: Payer: Self-pay | Admitting: Medical Oncology

## 2024-12-15 ENCOUNTER — Telehealth: Payer: Self-pay

## 2024-12-15 DIAGNOSIS — M79605 Pain in left leg: Secondary | ICD-10-CM

## 2024-12-15 NOTE — Telephone Encounter (Signed)
 Spoke with the patients daughter regarding the patients left lower leg pain. Informed her that Dr. Sherrod has ordered an ultrasound. Provided the daughter with the phone number to call and schedule the ultrasound. Daughter voiced understanding.

## 2024-12-15 NOTE — Progress Notes (Signed)
 us 

## 2024-12-18 ENCOUNTER — Inpatient Hospital Stay

## 2024-12-18 ENCOUNTER — Inpatient Hospital Stay: Admitting: Nurse Practitioner

## 2024-12-18 ENCOUNTER — Inpatient Hospital Stay: Admitting: Internal Medicine

## 2024-12-19 ENCOUNTER — Ambulatory Visit (HOSPITAL_COMMUNITY)

## 2024-12-19 ENCOUNTER — Telehealth

## 2024-12-19 ENCOUNTER — Inpatient Hospital Stay: Admitting: Nutrition

## 2024-12-20 ENCOUNTER — Inpatient Hospital Stay

## 2024-12-25 ENCOUNTER — Inpatient Hospital Stay

## 2024-12-27 ENCOUNTER — Telehealth

## 2025-01-01 ENCOUNTER — Inpatient Hospital Stay

## 2025-01-08 ENCOUNTER — Inpatient Hospital Stay: Admitting: Internal Medicine

## 2025-01-08 ENCOUNTER — Inpatient Hospital Stay

## 2025-01-15 ENCOUNTER — Inpatient Hospital Stay: Attending: Internal Medicine

## 2025-01-22 ENCOUNTER — Inpatient Hospital Stay

## 2025-01-24 ENCOUNTER — Ambulatory Visit: Admitting: Pulmonary Disease

## 2025-01-29 ENCOUNTER — Inpatient Hospital Stay: Admitting: Physician Assistant

## 2025-01-29 ENCOUNTER — Inpatient Hospital Stay

## 2025-02-05 ENCOUNTER — Inpatient Hospital Stay

## 2025-02-12 ENCOUNTER — Inpatient Hospital Stay: Attending: Internal Medicine

## 2025-02-19 ENCOUNTER — Inpatient Hospital Stay

## 2025-02-19 ENCOUNTER — Inpatient Hospital Stay: Admitting: Internal Medicine

## 2025-02-26 ENCOUNTER — Inpatient Hospital Stay
